# Patient Record
Sex: Female | Born: 1965
Health system: Southern US, Community
[De-identification: ages and names within clinical notes are randomized; demographics above are authoritative.]

## PROBLEM LIST (undated history)

## (undated) DIAGNOSIS — R002 Palpitations: Secondary | ICD-10-CM

## (undated) DIAGNOSIS — I499 Cardiac arrhythmia, unspecified: Secondary | ICD-10-CM

## (undated) DIAGNOSIS — Z972 Presence of dental prosthetic device (complete) (partial): Secondary | ICD-10-CM

## (undated) DIAGNOSIS — I70208 Unspecified atherosclerosis of native arteries of extremities, other extremity: Secondary | ICD-10-CM

## (undated) DIAGNOSIS — K2970 Gastritis, unspecified, without bleeding: Secondary | ICD-10-CM

## (undated) DIAGNOSIS — I447 Left bundle-branch block, unspecified: Secondary | ICD-10-CM

## (undated) DIAGNOSIS — K08109 Complete loss of teeth, unspecified cause, unspecified class: Secondary | ICD-10-CM

## (undated) DIAGNOSIS — J439 Emphysema, unspecified: Secondary | ICD-10-CM

## (undated) DIAGNOSIS — J449 Chronic obstructive pulmonary disease, unspecified: Secondary | ICD-10-CM

## (undated) DIAGNOSIS — I428 Other cardiomyopathies: Secondary | ICD-10-CM

## (undated) DIAGNOSIS — E785 Hyperlipidemia, unspecified: Secondary | ICD-10-CM

## (undated) DIAGNOSIS — I201 Angina pectoris with documented spasm: Secondary | ICD-10-CM

## (undated) DIAGNOSIS — I5020 Unspecified systolic (congestive) heart failure: Secondary | ICD-10-CM

## (undated) DIAGNOSIS — K219 Gastro-esophageal reflux disease without esophagitis: Secondary | ICD-10-CM

## (undated) DIAGNOSIS — R0789 Other chest pain: Secondary | ICD-10-CM

## (undated) DIAGNOSIS — C801 Malignant (primary) neoplasm, unspecified: Secondary | ICD-10-CM

## (undated) HISTORY — PX: CHOLECYSTECTOMY: SHX55

## (undated) HISTORY — DX: Other chest pain: R07.89

## (undated) HISTORY — DX: Palpitations: R00.2

## (undated) HISTORY — DX: Angina pectoris with documented spasm: I20.1

## (undated) HISTORY — DX: Unspecified atherosclerosis of native arteries of extremities, other extremity: I70.208

## (undated) HISTORY — PX: TONSILLECTOMY: SUR1361

## (undated) HISTORY — DX: Other cardiomyopathies: I42.8

## (undated) HISTORY — PX: DEBRIDEMENT TENNIS ELBOW: SHX1442

## (undated) HISTORY — PX: BUNIONECTOMY: SHX129

## (undated) HISTORY — PX: BACK SURGERY: SHX140

## (undated) HISTORY — PX: LUMBAR DISC SURGERY: SHX700

## (undated) HISTORY — PX: ROTATOR CUFF REPAIR: SHX139

## (undated) HISTORY — PX: ABDOMINAL HYSTERECTOMY: SHX81

## (undated) HISTORY — DX: Malignant (primary) neoplasm, unspecified: C80.1

## (undated) HISTORY — PX: APPENDECTOMY: SHX54

---

## 2000-04-22 ENCOUNTER — Other Ambulatory Visit: Admission: RE | Admit: 2000-04-22 | Discharge: 2000-04-22 | Payer: Self-pay | Admitting: Podiatry

## 2005-07-16 ENCOUNTER — Ambulatory Visit: Payer: Self-pay | Admitting: Orthopedic Surgery

## 2005-09-05 ENCOUNTER — Emergency Department: Payer: Self-pay | Admitting: Emergency Medicine

## 2006-08-29 ENCOUNTER — Emergency Department (HOSPITAL_COMMUNITY): Admission: EM | Admit: 2006-08-29 | Discharge: 2006-08-29 | Payer: Self-pay | Admitting: Emergency Medicine

## 2006-11-19 ENCOUNTER — Emergency Department: Payer: Self-pay | Admitting: Emergency Medicine

## 2006-11-26 ENCOUNTER — Ambulatory Visit: Payer: Self-pay

## 2006-12-06 ENCOUNTER — Emergency Department: Payer: Self-pay | Admitting: Emergency Medicine

## 2006-12-24 ENCOUNTER — Ambulatory Visit: Payer: Self-pay | Admitting: Pain Medicine

## 2007-01-06 ENCOUNTER — Ambulatory Visit: Payer: Self-pay | Admitting: Pain Medicine

## 2007-01-19 ENCOUNTER — Ambulatory Visit: Payer: Self-pay | Admitting: Physician Assistant

## 2007-01-22 ENCOUNTER — Ambulatory Visit: Payer: Self-pay | Admitting: Pain Medicine

## 2007-01-30 ENCOUNTER — Ambulatory Visit (HOSPITAL_COMMUNITY): Admission: RE | Admit: 2007-01-30 | Discharge: 2007-01-31 | Payer: Self-pay | Admitting: Orthopaedic Surgery

## 2007-04-05 ENCOUNTER — Encounter: Admission: RE | Admit: 2007-04-05 | Discharge: 2007-04-05 | Payer: Self-pay | Admitting: Orthopaedic Surgery

## 2007-05-25 ENCOUNTER — Inpatient Hospital Stay (HOSPITAL_COMMUNITY): Admission: RE | Admit: 2007-05-25 | Discharge: 2007-05-28 | Payer: Self-pay | Admitting: Orthopaedic Surgery

## 2008-03-11 ENCOUNTER — Emergency Department: Payer: Self-pay | Admitting: Emergency Medicine

## 2008-11-18 ENCOUNTER — Emergency Department: Payer: Self-pay | Admitting: Emergency Medicine

## 2009-01-09 ENCOUNTER — Emergency Department: Payer: Self-pay | Admitting: Emergency Medicine

## 2009-06-11 ENCOUNTER — Encounter (INDEPENDENT_AMBULATORY_CARE_PROVIDER_SITE_OTHER): Payer: Self-pay | Admitting: General Surgery

## 2009-06-11 ENCOUNTER — Observation Stay (HOSPITAL_COMMUNITY): Admission: EM | Admit: 2009-06-11 | Discharge: 2009-06-12 | Payer: Self-pay | Admitting: Emergency Medicine

## 2011-03-25 LAB — COMPREHENSIVE METABOLIC PANEL
Albumin: 3.4 g/dL — ABNORMAL LOW (ref 3.5–5.2)
Alkaline Phosphatase: 99 U/L (ref 39–117)
Calcium: 9.3 mg/dL (ref 8.4–10.5)
Creatinine, Ser: 0.83 mg/dL (ref 0.4–1.2)
Potassium: 3.4 mEq/L — ABNORMAL LOW (ref 3.5–5.1)
Sodium: 140 mEq/L (ref 135–145)
Total Protein: 6.3 g/dL (ref 6.0–8.3)

## 2011-03-25 LAB — URINALYSIS, ROUTINE W REFLEX MICROSCOPIC
Glucose, UA: NEGATIVE mg/dL
Ketones, ur: NEGATIVE mg/dL
Specific Gravity, Urine: 1.005 (ref 1.005–1.030)
Urobilinogen, UA: 0.2 mg/dL (ref 0.0–1.0)

## 2011-03-25 LAB — CBC
MCHC: 34.8 g/dL (ref 30.0–36.0)
MCV: 93 fL (ref 78.0–100.0)
RDW: 12.5 % (ref 11.5–15.5)

## 2011-04-30 NOTE — Op Note (Signed)
NAMEELIZAH, Conley                ACCOUNT NO.:  [id number]   MEDICAL RECORD NO.:  [id number]          PATIENT TYPE:  INP   LOCATION:  5002                         FACILITY:  [facility]   PHYSICIAN:  Mark C. Ophelia Charter, M.D.    DATE OF BIRTH:  May 26, 1966   DATE OF PROCEDURE:  05/25/2007  DATE OF DISCHARGE:                               OPERATIVE REPORT   PREOPERATIVE DIAGNOSIS:  Recurrent L4-5 herniated nucleus pulposus.   POSTOPERATIVE DIAGNOSIS:  Recurrent L4-5 herniated nucleus pulposus.   PROCEDURES:  1. L5-S1 right microdiskectomy for recurrent herniated nucleus      pulposus.  2. L5-S1 right translumbar interbody fusion with PEEK interbody cage,      local bone interbody and bilateral intertransverse process fusion.  3. Pedicle instrumentation, one level.   SURGEON:  Mark C. Ophelia Charter, MD   ASSISTANT:  Maud Deed, PA-C   ANESTHESIA:  GOT.   ESTIMATED BLOOD LOSS:  700 mL.   DRAINS:  One Hemovac.   After induction of general anesthesia, orotracheal intubation, the  patient placed prone.  Preoperative Ancef was given prophylactically.  The back was prepped with DuraPrep.  The usual area had been squared  with towels, Betadine Vi-Drape applied after sterile skin marker.  The  patient had a previous microdiskectomy in February 2008 with disk  recurrence 2 months afterwards.  She had had narrowing of the space with  the endplate edema changes at L5-S1.   Incision was made using the old scar, extended proximally and distally,  and the viper retractor was placed.  The L5-S1 level was identified,  facets were exposed, and the transverse process re-exposed at L5 and the  sacral ala.  Cross-table lateral C-arm confirmed that we were at the  appropriate level with a Kocher clamp placed directly over the disk.  L5  Laminectomy was performed, decompression at the level of the pedicle.  Microdiskectomy was performed first on the left side and then on the  right side.  Scar tissue was taken  down.  There was distal scar tissue  around the nerve root and scan for __________  did not show the degree  of disk herniation with a large fragment of disc scarred down that had  been extruded and had cephalad migration.  Disk fragments were out  laterally on the right as well.  The pedicle was identified at S1 and L5  and using an osteotome, bone was removed, taking the facet on the right  side, exposing the nerve root above as it swept underneath the pedicle,  careful preservation.  Gelfoam  and small patties were used and the  bipolar cautery was used in the axilla for bleeding.  Microdiskectomy  was performed, removing chunks of disk and continuing to sweep with  Epsteins and microdissection until finally all the fragment had been  removed.  The nerve root was free.  The operative microscope was removed  and pedicle screws were placed.  Fluoroscopy was used intermittently,  checking first with the pedicle starter, checking this with a spot film  followed by the joystick impaction, checking it using the  ball-tip  feeler to make sure it was inbound, followed by tapping, feeling once  again, and then placement of the screw.  On the right side at L5 a 45-mm  screw was used.  All other screws were 40 mm, and all were 5.5 mm [employer]  Polaris pedicle instrumentation system.  On the L5 level on the left,  one pass was made with a pedicle finder, which looked in excellent  position.  As the joystick was being passed, it passed inferiorly.  There was a jump of the nerve root.  There was no dural tear.  It was  identified out the pedicle inferiorly.  It was backed up and then  appropriately repositioned, checked under fluoroscopy.  The operative  microscope was used to look at the nerve root and it looked fine.  After  a feeler was used to make sure it was entirely in the pedicle, tapping,  tapping, feeling again, and then placement of the screw, checking under  fluoroscopy, and it was in  excellent position.  A pedicle screw was  placed on the left at S1 in standard fashion and then 40 mm rods were  used on both sides after decortication of the transverse processes at L5  and the sacral ala.  Aspirate from the pedicle was used along with local  blood and some Vitoss mixed with local bone taken from the laminectomy  and removal of the facet.  Large chunks of bone were packed anteriorly  after a diskectomy was performed on both sides.  A box 7 and a box 9 mm  box cutter was used, followed by trials.  Bone was packed anteriorly  with the tap, impacted.  A trial was placed behind it to make sure that  the real cage would fit.  A PEEK cage was packed with bone locally.  It  was impacted and rotated and checked under fluoroscopy and was in  transverse position with 6-8 large chunks of bone anterior to it.  The  cage was stable.  Rods were placed, locked to compress first on the  right, then on the left side.  All caps were secure.  Final spot  fluoroscopic pictures were taken with the pickups on the right side.  The left S1 pedicle screw was slightly more lateral but with palpation  was entirely inbound and with 40-mm size it was not long enough  penetrate anteriorly out.  A hockey stick was used to feel around each  pedicle.  Inferiorly the nerve roots were all checked and were all free.  After irrigation with saline solution, some small tiny pieces of bone  were removed that had fallen in the canal, packed back over lateral.  Vicryl #1 was used in the fascia, followed by 2-0 Vicryl in the  subcutaneous tissue, 4-0 Vicryl subcuticular, tincture of Benzoin, Steri-  Strips, Marcaine infiltration, 4x4s, ABD and dressing.  Instrument count  and needle count was correct.  A Foley was placed at the beginning of  the procedure and was left in.  Patty count was correct.      Mark C. Ophelia Charter, M.D.  Electronically Signed    MCY/MEDQ  D:  05/25/2007  T:  05/26/2007  Job:  [id number]

## 2011-04-30 NOTE — Op Note (Signed)
NAMEELLIOTT, QUADE                ACCOUNT NO.:  [id number]   MEDICAL RECORD NO.:  [id number]          PATIENT TYPE:  INP   LOCATION:  5150                         FACILITY:  [facility]   PHYSICIAN:  Almond Lint, MD       DATE OF BIRTH:  [birth year]   DATE OF PROCEDURE:  06/11/2009  DATE OF DISCHARGE:                               OPERATIVE REPORT   PREOPERATIVE DIAGNOSIS:  Acute appendicitis.   POSTOPERATIVE DIAGNOSIS:  Acute appendicitis.   PROCEDURE PERFORMED:  Laparoscopic appendectomy.   FINDINGS:  Very mild appendicitis.  Normal right ovary, terminal ileum  normal.   SURGEON:  Almond Lint, MD   ASSISTANT:  None.   ANESTHESIA:  General and local.   ESTIMATED BLOOD LOSS:  Minimal.   COMPLICATIONS:  None known.   PROCEDURE:  Ms. Schlagel was identified in the holding area and taken to  the operating room where she was placed supine on the operating room  table.  General anesthesia was induced.  The patient's abdomen was  prepped and draped in a sterile fashion after her left arm was tucked  and a Foley catheter was placed.  A time-out was performed according to  the surgical safety check list.  When all was correct, we continued.  The infraumbilical skin was anesthetized with local anesthetic.  A  curvilinear and transverse incision was made with an #11 blade.  The  subcutaneous tissues were spread with a Kelly, and the umbilical stalk  elevated with the Kocher clamp.  The midline fascia was incised with an  #11 blade and a Kelly clamp was used to confirm entrance into the  peritoneal cavity.  A pursestring suture of 0 Vicryl was placed around  the fascial incision.  The Hasson trocar was introduced into the abdomen  and pneumoperitoneum was achieved to a pressure of 15 mmHg.  The patient  was placed into Trendelenburg position and rotated to the left.  The  camera was advanced into the abdomen and immediately a dilated appendix  was seen.  It was also slightly hyperemic.   The 2 additional ports were  placed in the midline suprapubic region and the left lower quadrant  after administration of local under direct visualization.  The appendix  was elevated with a locking grasper and the Kentucky clamp was used to  dissect the mesoappendix away from the appendiceal base.  The Harmonic  scalpel was used to divide the mesoappendix and the cecum was  manipulated to ensure that we were at the base of the appendix.  We  switched from the 10-mm scope to the 5-mm scope, and the Endo GIA was  used to fire across the base of the appendix.  This was then placed into  an EndoCatch bag and removed through the umbilical incision.  The  appendiceal stump was inspected, and there were no signs of bleeding.  The area was irrigated and the left ovary was examined and seen to be  normal.  The patient has had a history of hysterectomy and one ovary  removed.  The pneumoperitoneum was allowed to evacuate, and  the  epigastric and left  lower quadrant ports were removed.  The Hasson was then removed, and the  pursestring suture was used to close the umbilical fascia.  This was  palpated to make sure that the closure was tight.  The skin of all of  the incisions was closed with 4-0 Monocryl in a subcuticular fashion.  The wounds were cleaned, dried, and dressed with Dermabond.      Almond Lint, MD  Electronically Signed     FB/MEDQ  D:  06/11/2009  T:  06/12/2009  Job:  [id number]

## 2011-05-03 NOTE — Discharge Summary (Signed)
NAMESHAELEIGH, Caroline Conley                ACCOUNT NO.:  [id number]   MEDICAL RECORD NO.:  [id number]          PATIENT TYPE:  INP   LOCATION:  5002                         FACILITY:  [facility]   PHYSICIAN:  Mark C. Ophelia Charter, M.D.    DATE OF BIRTH:  09-Sep-1966   DATE OF ADMISSION:  05/25/2007  DATE OF DISCHARGE:  05/28/2007                               DISCHARGE SUMMARY   ADMISSION DIAGNOSES:  1. Recurrent herniated nucleus pulposus L4-5.  2. Status post L5-S1 microdiskectomy, February 2008.   DISCHARGE DIAGNOSES:  1. Recurrent L4-5 herniated nucleus pulposus.  2. Recurrent herniated nucleus pulposus L4-5.  3. Status post L5-S1 microdiskectomy, February 2008.  4. Posthemorrhagic anemia.   PROCEDURE:  On May 25, 2007 the patient underwent:  1. L5-S1 right microdiskectomy for recurrent herniated nucleus      pulposus.  2. L5-S1 right translumbar interbody fusion, with interbody cage and      pedicle screw and rod instrumentation -- performed by Dr. Ophelia Charter and      assisted by Maud Deed [facility] under general anesthesia.   CONSULTATIONS:  None.   BRIEF HISTORY:  Patient is a 45 year old female with progressive right  lower extremity radicular pain.  She is status post right L5-S1  microdiskectomy in February 2008.  MRI scan has shown a right  paracentral disk protrusion, mildly displacing the descending right S1  nerve root.  It was felt that she would benefit from surgical  intervention and was admitted for the procedure as stated above.   BRIEF HOSPITAL COURSE:  The patient tolerated the procedure under  general anesthesia without complications.  Postoperatively she was  treated with PCA analgesics and weaned to p.o. analgesics, including  OxyContin 20 mg b.i.d. prior to discharge.  Tylox was used for  breakthrough pain.  She described predominantly low back pain during the  hospital stay, which was incisional in nature.  Very little leg pain  during the hospital stay.  The patient was  started on physical therapy  for ambulation and gait training.  Prior to discharge she was ambulating  in the hallway independently.  Prior to discharge she had ambulated as  much as 110 feet with a rolling walker and standby assistance.  She was  given instruction in back care as well.  Occupational therapy assisted  with ADLs.  The patient's wound was treated with daily dressing changes  and found to be healing without signs of infection.  The patient's  hemoglobin postoperatively was noted to be 8.8.  Repeat on May 27, 2007  showed values to be 8.4 and stable.  The patient did receive 100 mL of  Self-Saver blood intraoperatively.  Prior to discharge the patient was  voiding without difficulty.  The patient had a bowel movement during the  hospital stay, and was able to take a regular diet prior to discharge.  On May 28, 2007 the patient was afebrile with vital signs stable.  She  was felt stable for discharge to her home, with referral for home health  physical therapy.   PERTINENT LABORATORY VALUES:  Admission CBC:  Hemoglobin  13.7,  hematocrit 39.6 respectively.  Hemoglobin prior to discharge 8.4 and  hematocrit 23.6.  Coagulation studies on admission were within normal  limits, with exception of PTT noted mildly elevated at 38.  __________  CBC on admission showed potassium of 3.3, glucose 66.  Postoperatively  hemoglobin was normal at 3.7, glucose 111 and BUN 4.  Urinalysis on  admission was negative for urinary tract infection.  EKG  preoperatively  showed normal sinus rhythm, with sinus arrhythmia; no old tracings for  comparison.  No chest x-ray on the chart at the time of this dictation.   PLAN:  The patient was discharged to her home.  She was given  instructions to increase activity slowly, utilizing a walker for  ambulation.  She was instructed to change her dressing daily, and she  was allowed to shower at home, keeping the wound covered with her  dressing.    MEDICATIONS AT DISCHARGE:  1. OxyContin 20 mg one p.o. q.12 h.  2. Tylox 1-2 every 4-6 hours as needed for breakthrough pain.  3. Colace one p.o. b.i.d.  4. Iron sulfate 325 mg 1 tablet twice daily times 1 month.   FOLLOWUP:  She will follow up with Dr. Ophelia Charter 2 weeks from the date of  surgery.  She will receive home health physical therapy and occupational  therapy through [facility].  Necessary durable medical equipment  was made available to the patient prior to discharge from the hospital.  The patient will resume a regular diet.  She is instructed to call Dr.  Ophelia Charter should there be any complications or concerns prior to her return  office visit.  All questions were encouraged and answered prior to  discharge.      Wende Neighbors, P.A.      Mark C. Ophelia Charter, M.D.  Electronically Signed    SMV/MEDQ  D:  07/06/2007  T:  07/07/2007  Job:  [id number]

## 2011-05-03 NOTE — Op Note (Signed)
Caroline Conley, Caroline Conley                ACCOUNT NO.:  [id number]   MEDICAL RECORD NO.:  [id number]          PATIENT TYPE:  OIB   LOCATION:  5008                         FACILITY:  [facility]   PHYSICIAN:  Mark C. Ophelia Charter, M.D.    DATE OF BIRTH:  1966/02/02   DATE OF PROCEDURE:  01/30/2007  DATE OF DISCHARGE:  01/31/2007                               OPERATIVE REPORT   PREOPERATIVE DIAGNOSIS:  Right L5-S1 herniated nucleus pulposus.   POSTOPERATIVE DIAGNOSIS:  Right L5-S1 herniated nucleus pulposus.   PROCEDURE:  Right L5-S1 microdiskectomy.   SURGEON:  Mark C. Ophelia Charter, MD   ANESTHESIA:  GOT plus 7 mL local Marcaine skin.   ASSISTANT:  R.N.F.A.   ESTIMATED BLOOD LOSS:  Minimal.   This is a 45 year old female with L5-S1 HNP right with increased pain  for the last 16 weeks with positive straight leg raising, right peroneal  weakness, decreased sensation, S1, and an MRI demonstrating a disk  herniation with compression.   She underwent general anesthesia by Anesthesia staff, was transferred to  the prone position on the [place] frame and then placed in a kneeling  position with careful padding and positioning.  The back was prepped  DuraPrep, squared with towels.  Preoperative antibiotics were given,  time-out was taken, and a cross-table lateral, needle after Betadine, Vi-  Drape and laminectomy sheet and draped corresponded to the appropriate  level at right L5-S1.  Incision was made.  Dissection continued down to  the planned level.  The segment was identified and the level of the  needle preoperatively was exactly over the L5-S1 level.  A foraminotomy  was performed.  A portion of the lamina was removed.  Big chunks of  ligament were removed.  The nerve was gently retracted.  The disk  herniation was identified.  There was a central and right paracentral  disk protrusion, which was incised with the annulus.  Chunks of ligament  removed.  Epstein curettes were used.  There was no  significant  foraminal stenosis from facets but she did have some hypertrophic  ligamentum.  Once the disk protrusion was decompressed, some lateral  disks were coagulated the with bipolar cautery.  The nerve root was  free.  The foramen was enlarged slightly to give the nerve root a little  bit more room.  Sweeps were made anterior, distal, lateral and medial to  the nerve root as well as anterior to the dura with no areas of  remaining compression with palpation with the hockey stick.  The wound  was  irrigated and closed by layers with 0 Vicryl in the deep fascia, 2-0  Vicryl subcutaneous tissue, subcuticular skin closure, tincture of  Benzoin, Steri-Strips, Marcaine infiltration, a postop dressing and  tape.  The patient was transferred to the recovery room.  Instrument  count and needle count was correct.      Mark C. Ophelia Charter, M.D.  Electronically Signed     MCY/MEDQ  D:  04/06/2007  T:  04/07/2007  Job:  [id number]

## 2011-10-03 LAB — COMPREHENSIVE METABOLIC PANEL
ALT: 20
CO2: 28
Calcium: 9.7
GFR calc non Af Amer: >60
Glucose, Bld: 66 — ABNORMAL LOW
Potassium: 3.3 — ABNORMAL LOW
Sodium: 139

## 2011-10-03 LAB — URINALYSIS, ROUTINE W REFLEX MICROSCOPIC
Glucose, UA: NEGATIVE
Hgb urine dipstick: NEGATIVE
Ketones, ur: NEGATIVE
Protein, ur: NEGATIVE
Specific Gravity, Urine: 1.013 (ref 1.005–1.035)

## 2011-10-03 LAB — HEMOGLOBIN AND HEMATOCRIT, BLOOD
HCT: 23.6 — ABNORMAL LOW
Hemoglobin: 8.4 — ABNORMAL LOW

## 2011-10-03 LAB — BASIC METABOLIC PANEL
Calcium: 8.2 — ABNORMAL LOW
Chloride: 105
GFR calc Af Amer: >60
Glucose, Bld: 111 — ABNORMAL HIGH
Potassium: 3.7

## 2011-10-03 LAB — ABO/RH: ABO/RH(D): O NEG

## 2011-10-03 LAB — PROTIME-INR
INR: 1
Prothrombin Time: 13.3

## 2011-10-03 LAB — CBC: MCV: 92.2

## 2011-10-03 LAB — TYPE AND SCREEN: ABO/RH(D): O NEG

## 2011-10-03 LAB — APTT: aPTT: 38 — ABNORMAL HIGH

## 2011-10-16 ENCOUNTER — Encounter (HOSPITAL_COMMUNITY): Payer: Self-pay

## 2011-10-17 ENCOUNTER — Encounter (HOSPITAL_COMMUNITY): Payer: Self-pay

## 2011-10-17 ENCOUNTER — Encounter (HOSPITAL_COMMUNITY)
Admission: RE | Admit: 2011-10-17 | Discharge: 2011-10-17 | Disposition: A | Discharge status: Home / Self Care | Payer: BC Managed Care – PPO | Source: Ambulatory Visit | Attending: Orthopaedic Surgery | Admitting: Orthopaedic Surgery

## 2011-10-17 LAB — URINALYSIS, ROUTINE W REFLEX MICROSCOPIC
Bilirubin Urine: NEGATIVE
Glucose, UA: NEGATIVE mg/dL
Ketones, ur: NEGATIVE mg/dL
pH: 5 (ref 5.0–8.0)

## 2011-10-17 LAB — COMPREHENSIVE METABOLIC PANEL
AST: 15 U/L (ref 0–37)
Albumin: 3.8 g/dL (ref 3.5–5.2)
Alkaline Phosphatase: 118 U/L — ABNORMAL HIGH (ref 39–117)
Chloride: 103 mEq/L (ref 96–112)
Potassium: 4.6 mEq/L (ref 3.5–5.1)
Total Bilirubin: 0.3 mg/dL (ref 0.3–1.2)
Total Protein: 6.8 g/dL (ref 6.0–8.3)

## 2011-10-17 LAB — CBC
HCT: 38.5 % (ref 36.0–46.0)
Hemoglobin: 13.2 g/dL (ref 12.0–15.0)
MCH: 32.1 pg (ref 26.0–34.0)
MCV: 93.7 fL (ref 78.0–100.0)
RBC: 4.11 MIL/uL (ref 3.87–5.11)

## 2011-10-17 LAB — SURGICAL PCR SCREEN
MRSA, PCR: NEGATIVE
Staphylococcus aureus: NEGATIVE

## 2011-10-17 LAB — URINE MICROSCOPIC-ADD ON

## 2011-10-17 MED ORDER — CEFAZOLIN SODIUM 1-5 GM-% IV SOLN: 1.0000 g | INTRAVENOUS | Status: DC

## 2011-10-17 NOTE — Pre-Procedure Instructions (Signed)
20 Caroline Conley  10/17/2011   Your procedure is scheduled on: Monday 10/21/11   Report to [facility] at 1030 AM.  Call this number if you have problems the morning of surgery: [phone]   Remember:   Do not eat food:After Midnight. SUNDAY  Do not drink clear liquids: 4 Hours before arrival.  Take these medicines the morning of surgery with A SIP OF WATER:  NORCO  Do not wear jewelry, make-up or nail polish.  Do not wear lotions, powders, or perfumes. You may wear deodorant.  Do not shave 48 hours prior to surgery.  Do not bring valuables to the hospital.  Contacts, dentures or bridgework may not be worn into surgery.  Leave suitcase in the car. After surgery it may be brought to your room.  For patients admitted to the hospital, checkout time is 11:00 AM the day of discharge.   Patients discharged the day of surgery will not be allowed to drive home.  Name and phone number of your driver:  Caroline Conley -- [phone]   [phone]                Special Instructions: CHG Shower Use Special Wash: 1/2 bottle night before surgery and 1/2 bottle morning of surgery.   Please read over the following fact sheets that you were given: Pain Booklet, Coughing and Deep Breathing, Blood Transfusion Information, MRSA Information and Surgical Site Infection Prevention

## 2011-10-18 ENCOUNTER — Ambulatory Visit
Admission: RE | Admit: 2011-10-18 | Discharge: 2011-10-18 | Disposition: A | Discharge status: Home / Self Care | Payer: BC Managed Care – PPO | Source: Ambulatory Visit | Attending: Orthopaedic Surgery | Admitting: Orthopaedic Surgery

## 2011-10-18 ENCOUNTER — Other Ambulatory Visit: Payer: Self-pay | Admitting: Orthopaedic Surgery

## 2011-10-18 DIAGNOSIS — M549 Dorsalgia, unspecified: Secondary | ICD-10-CM

## 2011-10-20 ENCOUNTER — Other Ambulatory Visit: Payer: Self-pay | Admitting: Orthopaedic Surgery

## 2011-10-21 ENCOUNTER — Inpatient Hospital Stay (HOSPITAL_COMMUNITY)
Admission: RE | Admit: 2011-10-21 | Discharge: 2011-10-23 | DRG: 756 | Disposition: A | Discharge status: Home / Self Care | Payer: BC Managed Care – PPO | Source: Ambulatory Visit | Attending: Orthopaedic Surgery | Admitting: Orthopaedic Surgery

## 2011-10-21 ENCOUNTER — Encounter (HOSPITAL_COMMUNITY): Payer: Self-pay | Admitting: *Deleted

## 2011-10-21 ENCOUNTER — Encounter (HOSPITAL_COMMUNITY)
Admission: RE | Disposition: A | Discharge status: Home / Self Care | Payer: Self-pay | Source: Ambulatory Visit | Attending: Orthopaedic Surgery

## 2011-10-21 ENCOUNTER — Encounter (HOSPITAL_COMMUNITY): Payer: Self-pay | Admitting: Anesthesiology

## 2011-10-21 ENCOUNTER — Inpatient Hospital Stay (HOSPITAL_COMMUNITY): Payer: BC Managed Care – PPO

## 2011-10-21 ENCOUNTER — Inpatient Hospital Stay (HOSPITAL_COMMUNITY): Payer: BC Managed Care – PPO | Admitting: Anesthesiology

## 2011-10-21 DIAGNOSIS — T84498A Other mechanical complication of other internal orthopedic devices, implants and grafts, initial encounter: Principal | ICD-10-CM | POA: Diagnosis present

## 2011-10-21 DIAGNOSIS — Z79899 Other long term (current) drug therapy: Secondary | ICD-10-CM

## 2011-10-21 DIAGNOSIS — Z01818 Encounter for other preprocedural examination: Secondary | ICD-10-CM

## 2011-10-21 DIAGNOSIS — Y831 Surgical operation with implant of artificial internal device as the cause of abnormal reaction of the patient, or of later complication, without mention of misadventure at the time of the procedure: Secondary | ICD-10-CM | POA: Diagnosis present

## 2011-10-21 DIAGNOSIS — Z981 Arthrodesis status: Secondary | ICD-10-CM

## 2011-10-21 DIAGNOSIS — F17200 Nicotine dependence, unspecified, uncomplicated: Secondary | ICD-10-CM | POA: Diagnosis present

## 2011-10-21 DIAGNOSIS — Z01812 Encounter for preprocedural laboratory examination: Secondary | ICD-10-CM

## 2011-10-21 LAB — TYPE AND SCREEN: Antibody Screen: NEGATIVE

## 2011-10-21 SURGERY — POSTERIOR LUMBAR FUSION 1 LEVEL
Anesthesia: General | Site: Back | Wound class: Clean

## 2011-10-21 MED ORDER — ONDANSETRON HCL 4 MG/2ML IJ SOLN
INTRAMUSCULAR | Status: DC | PRN
  Administered 2011-10-21: 4 mg via INTRAVENOUS

## 2011-10-21 MED ORDER — ACETAMINOPHEN 325 MG PO TABS: 650.0000 mg | ORAL_TABLET | ORAL | Status: DC | PRN

## 2011-10-21 MED ORDER — ONDANSETRON HCL 4 MG/2ML IJ SOLN: 4.0000 mg | INTRAMUSCULAR | Status: DC | PRN

## 2011-10-21 MED ORDER — OXYCODONE-ACETAMINOPHEN 5-325 MG PO TABS
1.0000 | ORAL_TABLET | ORAL | Status: DC | PRN
  Administered 2011-10-23: 2 via ORAL
  Filled 2011-10-21: qty 2

## 2011-10-21 MED ORDER — ONDANSETRON HCL 4 MG/2ML IJ SOLN: 4.0000 mg | Freq: Once | INTRAMUSCULAR | Status: DC | PRN

## 2011-10-21 MED ORDER — SODIUM CHLORIDE 0.9 % IJ SOLN
3.0000 mL | Freq: Two times a day (BID) | INTRAMUSCULAR | Status: DC
  Administered 2011-10-22: 3 mL via INTRAVENOUS

## 2011-10-21 MED ORDER — PHENOL 1.4 % MT LIQD
1.0000 | OROMUCOSAL | Status: DC | PRN
  Filled 2011-10-21: qty 177

## 2011-10-21 MED ORDER — EPHEDRINE SULFATE 50 MG/ML IJ SOLN
INTRAMUSCULAR | Status: DC | PRN
  Administered 2011-10-21 (×2): 5 mg via INTRAVENOUS

## 2011-10-21 MED ORDER — THROMBIN 5000 UNITS EX KIT
PACK | CUTANEOUS | Status: DC | PRN
  Administered 2011-10-21: 5000 [IU] via TOPICAL

## 2011-10-21 MED ORDER — NEOSTIGMINE METHYLSULFATE 1 MG/ML IJ SOLN
INTRAMUSCULAR | Status: DC | PRN
  Administered 2011-10-21: 3 mg via INTRAVENOUS

## 2011-10-21 MED ORDER — MIDAZOLAM HCL 5 MG/5ML IJ SOLN
INTRAMUSCULAR | Status: DC | PRN
  Administered 2011-10-21: 2 mg via INTRAVENOUS

## 2011-10-21 MED ORDER — HYDROMORPHONE HCL PF 1 MG/ML IJ SOLN
0.2500 mg | INTRAMUSCULAR | Status: DC | PRN
  Administered 2011-10-21: 0.25 mg via INTRAVENOUS
  Administered 2011-10-21 (×2): 0.5 mg via INTRAVENOUS
  Administered 2011-10-21: 0.25 mg via INTRAVENOUS
  Administered 2011-10-21: 0.5 mg via INTRAVENOUS

## 2011-10-21 MED ORDER — LACTATED RINGERS IV SOLN
INTRAVENOUS | Status: DC
  Administered 2011-10-21: 12:00:00 via INTRAVENOUS

## 2011-10-21 MED ORDER — PROPOFOL 10 MG/ML IV EMUL
INTRAVENOUS | Status: DC | PRN
  Administered 2011-10-21: 200 mg via INTRAVENOUS

## 2011-10-21 MED ORDER — METHOCARBAMOL 500 MG PO TABS
500.0000 mg | ORAL_TABLET | Freq: Four times a day (QID) | ORAL | Status: DC | PRN
  Administered 2011-10-21 – 2011-10-23 (×3): 500 mg via ORAL
  Filled 2011-10-21 (×3): qty 1

## 2011-10-21 MED ORDER — DIPHENHYDRAMINE HCL 50 MG/ML IJ SOLN: 12.5000 mg | Freq: Four times a day (QID) | INTRAMUSCULAR | Status: DC | PRN

## 2011-10-21 MED ORDER — DOCUSATE SODIUM 100 MG PO CAPS
100.0000 mg | ORAL_CAPSULE | Freq: Two times a day (BID) | ORAL | Status: DC
  Administered 2011-10-21 – 2011-10-23 (×4): 100 mg via ORAL
  Filled 2011-10-21 (×4): qty 1

## 2011-10-21 MED ORDER — FENTANYL CITRATE 0.05 MG/ML IJ SOLN
INTRAMUSCULAR | Status: DC | PRN
  Administered 2011-10-21 (×3): 100 ug via INTRAVENOUS
  Administered 2011-10-21: 50 ug via INTRAVENOUS
  Administered 2011-10-21: 150 ug via INTRAVENOUS

## 2011-10-21 MED ORDER — SODIUM CHLORIDE 0.9 % IV SOLN
INTRAVENOUS | Status: DC | PRN
  Administered 2011-10-21: 16:00:00 via INTRAVENOUS

## 2011-10-21 MED ORDER — ONDANSETRON HCL 4 MG/2ML IJ SOLN: 4.0000 mg | Freq: Four times a day (QID) | INTRAMUSCULAR | Status: DC | PRN

## 2011-10-21 MED ORDER — NALOXONE HCL 0.4 MG/ML IJ SOLN: 0.4000 mg | INTRAMUSCULAR | Status: DC | PRN

## 2011-10-21 MED ORDER — THROMBIN 20000 UNITS EX KIT
PACK | CUTANEOUS | Status: DC | PRN
  Administered 2011-10-21: 20000 [IU] via TOPICAL

## 2011-10-21 MED ORDER — LACTATED RINGERS IV SOLN
INTRAVENOUS | Status: DC | PRN
  Administered 2011-10-21 (×2): via INTRAVENOUS

## 2011-10-21 MED ORDER — DIPHENHYDRAMINE HCL 12.5 MG/5ML PO ELIX
12.5000 mg | ORAL_SOLUTION | Freq: Four times a day (QID) | ORAL | Status: DC | PRN
  Filled 2011-10-21: qty 5

## 2011-10-21 MED ORDER — HYDROMORPHONE HCL PF 1 MG/ML IJ SOLN: 0.5000 mg | INTRAMUSCULAR | Status: DC | PRN

## 2011-10-21 MED ORDER — METHOCARBAMOL 100 MG/ML IJ SOLN
500.0000 mg | Freq: Four times a day (QID) | INTRAVENOUS | Status: DC | PRN
  Filled 2011-10-21 (×2): qty 5

## 2011-10-21 MED ORDER — KCL IN DEXTROSE-NACL 20-5-0.45 MEQ/L-%-% IV SOLN
INTRAVENOUS | Status: DC
  Filled 2011-10-21 (×10): qty 1000

## 2011-10-21 MED ORDER — ROCURONIUM BROMIDE 100 MG/10ML IV SOLN
INTRAVENOUS | Status: DC | PRN
  Administered 2011-10-21: 10 mg via INTRAVENOUS
  Administered 2011-10-21: 50 mg via INTRAVENOUS

## 2011-10-21 MED ORDER — HYDROMORPHONE 0.3 MG/ML IV SOLN
INTRAVENOUS | Status: AC
  Administered 2011-10-21: 7.5 mg via INTRAVENOUS
  Administered 2011-10-22: 1.79 mg via INTRAVENOUS
  Administered 2011-10-22: 2.79 mg via INTRAVENOUS
  Administered 2011-10-22: 2.19 mg via INTRAVENOUS
  Administered 2011-10-22: 1.99 mg via INTRAVENOUS
  Administered 2011-10-22: 1.59 mg via INTRAVENOUS
  Administered 2011-10-22: 7.5 mg via INTRAVENOUS
  Administered 2011-10-22: 1.19 mg via INTRAVENOUS
  Administered 2011-10-23: 3.19 mg via INTRAVENOUS
  Administered 2011-10-23: 7.5 mg via INTRAVENOUS
  Administered 2011-10-23: 1.79 mg via INTRAVENOUS

## 2011-10-21 MED ORDER — GLYCOPYRROLATE 0.2 MG/ML IJ SOLN
INTRAMUSCULAR | Status: DC | PRN
  Administered 2011-10-21: .6 mg via INTRAVENOUS

## 2011-10-21 MED ORDER — KETOROLAC TROMETHAMINE 30 MG/ML IJ SOLN
30.0000 mg | Freq: Once | INTRAMUSCULAR | Status: DC
  Administered 2011-10-21: 30 mg via INTRAVENOUS

## 2011-10-21 MED ORDER — SODIUM CHLORIDE 0.9 % IV SOLN: 250.0000 mL | INTRAVENOUS | Status: DC

## 2011-10-21 MED ORDER — SODIUM CHLORIDE 0.9 % IJ SOLN: 9.0000 mL | INTRAMUSCULAR | Status: DC | PRN

## 2011-10-21 MED ORDER — CEFAZOLIN SODIUM-DEXTROSE 2-3 GM-% IV SOLR
2.0000 g | Freq: Once | INTRAVENOUS | Status: DC
  Filled 2011-10-21: qty 50

## 2011-10-21 MED ORDER — ACETAMINOPHEN 650 MG RE SUPP: 650.0000 mg | RECTAL | Status: DC | PRN

## 2011-10-21 MED ORDER — SODIUM CHLORIDE 0.9 % IJ SOLN: 3.0000 mL | INTRAMUSCULAR | Status: DC | PRN

## 2011-10-21 MED ORDER — CEFAZOLIN SODIUM 1-5 GM-% IV SOLN
INTRAVENOUS | Status: DC | PRN
  Administered 2011-10-21: 2 g via INTRAVENOUS

## 2011-10-21 MED ORDER — MENTHOL 3 MG MT LOZG: 1.0000 | LOZENGE | OROMUCOSAL | Status: DC | PRN

## 2011-10-21 MED ORDER — CEFAZOLIN SODIUM 1-5 GM-% IV SOLN
1.0000 g | Freq: Three times a day (TID) | INTRAVENOUS | Status: AC
  Administered 2011-10-21 – 2011-10-22 (×2): 1 g via INTRAVENOUS
  Filled 2011-10-21 (×2): qty 50

## 2011-10-21 SURGICAL SUPPLY — 68 items
APL SKNCLS STERI-STRIP NONHPOA (GAUZE/BANDAGES/DRESSINGS) ×1
BENZOIN TINCTURE PRP APPL 2/3 (GAUZE/BANDAGES/DRESSINGS) ×2 IMPLANT
BLADE SURG ROTATE 9660 (MISCELLANEOUS) IMPLANT
BUR ROUND FLUTED 4 SOFT TCH (BURR) ×2 IMPLANT
CLOSURE STERI STRIP 1/2 X4 (GAUZE/BANDAGES/DRESSINGS) ×1 IMPLANT
CLOTH BEACON ORANGE TIMEOUT ST (SAFETY) ×2 IMPLANT
CONNECTOR CROSS LRG (Orthopedic Implant) ×1 IMPLANT
CORDS BIPOLAR (ELECTRODE) ×2 IMPLANT
COVER SURGICAL LIGHT HANDLE (MISCELLANEOUS) ×2 IMPLANT
DRAPE C-ARM 42X72 X-RAY (DRAPES) ×2 IMPLANT
DRAPE MICROSCOPE LEICA (MISCELLANEOUS) ×2 IMPLANT
DRAPE SURG 17X23 STRL (DRAPES) ×6 IMPLANT
DRAPE TABLE COVER HEAVY DUTY (DRAPES) ×2 IMPLANT
DRSG EMULSION OIL 3X3 NADH (GAUZE/BANDAGES/DRESSINGS) ×2 IMPLANT
DRSG PAD ABDOMINAL 8X10 ST (GAUZE/BANDAGES/DRESSINGS) ×4 IMPLANT
DURAPREP 26ML APPLICATOR (WOUND CARE) ×2 IMPLANT
ELECT BLADE 4.0 EZ CLEAN MEGAD (MISCELLANEOUS)
ELECT CAUTERY BLADE 6.4 (BLADE) ×2 IMPLANT
ELECT REM PT RETURN 9FT ADLT (ELECTROSURGICAL) ×2
ELECTRODE BLDE 4.0 EZ CLN MEGD (MISCELLANEOUS) ×1 IMPLANT
ELECTRODE REM PT RTRN 9FT ADLT (ELECTROSURGICAL) ×1 IMPLANT
EVACUATOR 1/8 PVC DRAIN (DRAIN) ×2 IMPLANT
GAUZE SPONGE 4X4 12PLY STRL LF (GAUZE/BANDAGES/DRESSINGS) ×1 IMPLANT
GLOVE BIOGEL PI IND STRL 7.5 (GLOVE) ×1 IMPLANT
GLOVE BIOGEL PI IND STRL 8 (GLOVE) ×1 IMPLANT
GLOVE BIOGEL PI INDICATOR 7.5 (GLOVE) ×1
GLOVE BIOGEL PI INDICATOR 8 (GLOVE) ×1
GLOVE ECLIPSE 7.0 STRL STRAW (GLOVE) ×2 IMPLANT
GLOVE ORTHO TXT STRL SZ7.5 (GLOVE) ×2 IMPLANT
GOWN PREVENTION PLUS LG XLONG (DISPOSABLE) IMPLANT
GOWN PREVENTION PLUS XLARGE (GOWN DISPOSABLE) ×3 IMPLANT
GOWN STRL NON-REIN LRG LVL3 (GOWN DISPOSABLE) ×4 IMPLANT
HEMOSTAT SURGICEL 2X14 (HEMOSTASIS) IMPLANT
KIT BASIN OR (CUSTOM PROCEDURE TRAY) ×2 IMPLANT
KIT ROOM TURNOVER OR (KITS) ×2 IMPLANT
NDL HYPO 25X1 1.5 SAFETY (NEEDLE) IMPLANT
NDL SUT .5 MAYO 1.404X.05X (NEEDLE) ×1 IMPLANT
NEEDLE HYPO 25X1 1.5 SAFETY (NEEDLE) ×2 IMPLANT
NEEDLE MAYO TAPER (NEEDLE) ×2
NS IRRIG 1000ML POUR BTL (IV SOLUTION) ×2 IMPLANT
PACK LAMINECTOMY ORTHO (CUSTOM PROCEDURE TRAY) ×2 IMPLANT
PAD ARMBOARD 7.5X6 YLW CONV (MISCELLANEOUS) ×4 IMPLANT
PATTIES SURGICAL .5 X.5 (GAUZE/BANDAGES/DRESSINGS) IMPLANT
PATTIES SURGICAL .75X.75 (GAUZE/BANDAGES/DRESSINGS) ×2 IMPLANT
PLUG POLARIS 5.5 (Orthopedic Implant) ×4 IMPLANT
Polaris 40mm Ti Curved Rod ×1 IMPLANT
Polaris 5.5 x 45mm Screw ×1 IMPLANT
ROD CURVED 45MM (Rod) ×1 IMPLANT
SPONGE GAUZE 4X4 12PLY (GAUZE/BANDAGES/DRESSINGS) ×2 IMPLANT
SPONGE LAP 18X18 X RAY DECT (DISPOSABLE) IMPLANT
SPONGE LAP 4X18 X RAY DECT (DISPOSABLE) ×4 IMPLANT
SPONGE SURGIFOAM ABS GEL 100 (HEMOSTASIS) ×1 IMPLANT
STAPLER VISISTAT 35W (STAPLE) IMPLANT
STRIP CLOSURE SKIN 1/2X4 (GAUZE/BANDAGES/DRESSINGS) ×1 IMPLANT
SURGIFLO TRUKIT (HEMOSTASIS) ×1 IMPLANT
SUT BONE WAX W31G (SUTURE) IMPLANT
SUT VIC AB 2-0 CT1 27 (SUTURE) ×2
SUT VIC AB 2-0 CT1 TAPERPNT 27 (SUTURE) ×1 IMPLANT
SUT VICRYL 0 TIES 12 18 (SUTURE) ×2 IMPLANT
SUT VICRYL 4-0 PS2 18IN ABS (SUTURE) IMPLANT
SUT VICRYL AB 2 0 TIES (SUTURE) ×2 IMPLANT
SYR CONTROL 10ML LL (SYRINGE) ×1 IMPLANT
TAPE CLOTH SURG 4X10 WHT LF (GAUZE/BANDAGES/DRESSINGS) ×1 IMPLANT
TOWEL OR 17X24 6PK STRL BLUE (TOWEL DISPOSABLE) ×2 IMPLANT
TOWEL OR 17X26 10 PK STRL BLUE (TOWEL DISPOSABLE) ×2 IMPLANT
TRAY FOLEY CATH 14FR (SET/KITS/TRAYS/PACK) ×2 IMPLANT
WATER STERILE IRR 1000ML POUR (IV SOLUTION) ×2 IMPLANT
YANKAUER SUCT BULB TIP NO VENT (SUCTIONS) ×2 IMPLANT

## 2011-10-21 NOTE — Preoperative (Signed)
Beta Blockers   Reason not to administer Beta Blockers:Not Applicable 

## 2011-10-21 NOTE — Anesthesia Procedure Notes (Addendum)
Procedure Name: Intubation Date/Time: 10/21/2011 12:52 PM Performed by: Gwenyth Allegra Pre-anesthesia Checklist: Patient identified, Emergency Drugs available, Suction available and Patient being monitored Patient Re-evaluated:Patient Re-evaluated prior to inductionOxygen Delivery Method: Circle System Utilized Preoxygenation: Pre-oxygenation with 100% oxygen Intubation Type: IV induction Ventilation: Mask ventilation without difficulty Laryngoscope Size: Mac and 3 Grade View: Grade I Tube type: Oral Tube size: 7.5 mm Number of attempts: 1 Airway Equipment and Method: stylet Placement Confirmation: ETT inserted through vocal cords under direct vision,  positive ETCO2,  breath sounds checked- equal and bilateral and CO2 detector Secured at: 21 cm Tube secured with: Tape

## 2011-10-21 NOTE — Transfer of Care (Signed)
Immediate Anesthesia Transfer of Care Note  Patient: Caroline Conley  Procedure(s) Performed:  POSTERIOR LUMBAR FUSION 1 LEVEL - REVISION L5-S1 FUSION, REMOVAL BROKEN PEDICLE SCREW, BONE GRAFT  Patient Location: PACU  Anesthesia Type: General  Level of Consciousness: sedated  Airway & Oxygen Therapy: Patient Spontanous Breathing and Patient connected to nasal cannula oxygen  Post-op Assessment: Report given to PACU RN  Post vital signs: Reviewed and stable  Complications: No apparent anesthesia complications

## 2011-10-21 NOTE — H&P (Signed)
The patient has been re-examined, and the chart reviewed, and there have been no interval changes to the documented history and physical.    The risks, benefits, and alternatives have been discussed at length, and the patient is willing to proceed.   

## 2011-10-21 NOTE — Anesthesia Preprocedure Evaluation (Addendum)
Anesthesia Evaluation  Patient identified by MRN, date of birth, ID band Patient awake    Reviewed: Allergy & Precautions, H&P , NPO status , Patient's Chart, lab work & pertinent test results  Airway Mallampati: I TM Distance: >3 FB Neck ROM: Full    Dental  (+) Edentulous Upper and Edentulous Lower   Pulmonary Current Smoker,    Pulmonary exam normal       Cardiovascular Exercise Tolerance: Good Regular Normal    Neuro/Psych    GI/Hepatic   Endo/Other    Renal/GU      Musculoskeletal   Abdominal Normal abdominal exam  (+)   Peds  Hematology   Anesthesia Other Findings   Reproductive/Obstetrics                           Anesthesia Physical Anesthesia Plan  ASA: II  Anesthesia Plan: General   Post-op Pain Management:    Induction: Intravenous  Airway Management Planned: Oral ETT  Additional Equipment:   Intra-op Plan:   Post-operative Plan: Extubation in OR  Informed Consent: I have reviewed the patients History and Physical, chart, labs and discussed the procedure including the risks, benefits and alternatives for the proposed anesthesia with the patient or authorized representative who has indicated his/her understanding and acceptance.     Plan Discussed with: CRNA, Anesthesiologist and Surgeon  Anesthesia Plan Comments:         Anesthesia Quick Evaluation

## 2011-10-21 NOTE — Brief Op Note (Signed)
10/21/2011  4:12 PM  PATIENT:  Caroline Conley  45 y.o. female  PRE-OPERATIVE DIAGNOSIS:  L5-S1 PSEUDARTHROSIS BROKEN SCREW  POST-OPERATIVE DIAGNOSIS:  L5-S1 PSEUDARTHROSIS BROKEN SCREW  PROCEDURE:  Procedure(s): POSTERIOR LUMBAR FUSION 1 LEVEL,  Removal of broken pedicle screw and rods,  Left interbody local bone grafting, replacement left S1 pedicle screw and reinstrumentation and compression.  Crosslink bar. Left S1 foraminotomy, pedicle bone grafting .   SURGEON:  Surgeon(s): Eldred Manges  PHYSICIAN ASSISTANT: Maud Deed PA-C  Medically necessary and present for the entire procedure.   ASSISTANTS: none   ANESTHESIA:   general   EBL:  Total I/O In: 2200 [I.V.:2100; Blood:100] Out: 1000 [Urine:650; Blood:350]  BLOOD ADMINISTERED:100 CC CELLSAVER  DRAINS: none   LOCAL MEDICATIONS USED:  NONE  SPECIMEN:  No Specimen  DISPOSITION OF SPECIMEN:  N/A  COUNTS:  YES  TOURNIQUET:  * No tourniquets in log *  DICTATION: .Other Dictation: Dictation Number [id number]  PLAN OF CARE: Admit to inpatient   PATIENT DISPOSITION:  PACU - hemodynamically stable.   Delay start of Pharmacological VTE agent (>24hrs) due to surgical blood loss or risk of bleeding:  yes

## 2011-10-21 NOTE — Anesthesia Postprocedure Evaluation (Signed)
Anesthesia Post Note  Patient: Caroline Conley  Procedure(s) Performed:  POSTERIOR LUMBAR FUSION 1 LEVEL - REVISION L5-S1 FUSION, REMOVAL BROKEN PEDICLE SCREW, BONE GRAFT  Anesthesia type: General  Patient location: PACU  Post pain: Pain level controlled and Adequate analgesia  Post assessment: Post-op Vital signs reviewed, Patient's Cardiovascular Status Stable, Respiratory Function Stable, Patent Airway and Pain level controlled  Last Vitals:  Filed Vitals:   10/21/11 1645  BP:   Pulse:   Temp: 36.8 C  Resp:     Post vital signs: Reviewed and stable  Level of consciousness: awake, alert  and oriented  Complications: No apparent anesthesia complications

## 2011-10-21 NOTE — Op Note (Signed)
Caroline Conley, Caroline Conley                ACCOUNT NO.:  [id number]  MEDICAL RECORD NO.:  [id number]  LOCATION:  5018                         FACILITY:  [facility]  PHYSICIAN:  Longino Trefz C. Ophelia Charter, M.D.    DATE OF BIRTH:  03/21/1966  DATE OF PROCEDURE:  10/21/2011 DATE OF DISCHARGE:                              OPERATIVE REPORT   PREOPERATIVE DIAGNOSIS:  Status post L4-5 fusion 2008 with pseudoarthrosis and broken S1 pedicle screw.  POSTOPERATIVE DIAGNOSIS:  Status post L4-5 fusion 2008 with pseudoarthrosis and broken S1 pedicle screw.  PROCEDURE:  Exploration L5-S1 fusion , removal of broken pedicle screw. Local interbody bone grafting, new S1 pedicle screw position, and bone grafting of the pedicle defect.  SURGEON:  Eather Chaires C. Ophelia Charter, MD  ANESTHESIA:  GOT.  ASSISTANT:  Maud Deed, [facility], medically necessary and present for the entire procedure.  This 45 year old female had a 5-1 fusion in 2008, did well, not taking any pain medication, was working full shows regular days and was in normal activity.  She was helping up the neighbor move some furniture 6- 8 weeks ago and developed sharp onset of low back pain, difficulty getting upright, back pain, radicular leg pain down her right leg, electrical radiating down to her foot, difficulty standing, and inability to sleep.  She is taking narcotic pain medication regularly. Plain radiograph showed broken pedicle screw and CT scan demonstrated pseudoarthrosis with incomplete healing of the interbody.  There was some bone present in the cage, abundant bone around the front of the cage, some on the sides, and some evidence of fusion of the facet on the left side.  Right side was used for the TLIF.  PROCEDURE:  After the patient was placed on chest rolls prone, careful padding and positioning, Foley catheter was placed before she was turned.  Leg pumpers were placed for DVT prophylaxis.  Time-out procedure was completed after prepping and draping.  A  sterile skin marker was used in the old incision and Betadine, Steri-Drape.  All incision was opened.  Subperiosteal dissection down to the lamina.  A lamina below and above were identified and then progressing out laterally over the facet to the hardware.  Scar tissue was removed off of the caps and rods.  Caps were removed and once all screws were well visualized, 2 rods were removed.  On the right side there was some motion with a Cobb placed between the pedicle screws with twisting and torquing.  On the side of the broken pedicle screw, there was only trace motion.  Broken screw portion was removed and then a good hour or so was spent on removing the broken pedicle screw.  A 6 mm screw removal was used initially by hand and then on power after laminectomy and S1 was performed using Kerrison protecting the dura with patties, foraminotomy of the nerve root so that the pedicle was directly visualized and D'Errico was placed on the S1 nerve root underneath the pedicle since the screw was immediately adjacent to the pedicle.  With over drilling on outside of the screw just enough to catch it, the base of the pedicle bone pinned down so that the screw could be visualized.  Nerve root was protected through this and finally the screw was removed completely. Continued bone was removed on the lateral gutter.  Disk space was exposed.  Initially some portions of the anulus and disk was removed on the left side of some pieces of old bone graft which had not fused.  A #7 blade was inserted, girdled, and twisted and there was trace motion on this side.  This was just lateral to the cage and this gave an avenue for bone grafting and the bone that had been meticulously cleaned, was packed down with the bone impactor after 2 endplates have been freshened up.  After this was packed, a piece of Gelfoam was placed over the top to keep the pieces of the tiny bone graft inside.  New pedicle screw was then  placed in the S1 on the left side starting more laterally and then aiming slightly medial.  This was a 45, previous 40 mm 5.5 screw had come just medially to the anterior cortex.  Starting slight lateral, the screw could see passing into the old anterior plane at the tip and once the anterior edge of the screw was placed against the anterior cortex, it was just tightened 1 extra turn to penetrate 1/2 to 1 thread through the anterior cortex for excellent tight fit.  Remaining bone was then grafted down into the pedicle hole and D'Errico was used to check the nerve root to make sure none of the bone came out through the 2-3 mm defect at the base of the pedicle so that there would not be any bone graft adjacent to the nerve root irritated.  __________ was irrigated, dura was intact.  The patient had Cell Saver used and was retransfused less than 100 mL.  Bone over the facet was rongeured up on the left side and then a 45 mm rod was placed on the left, 40 mm on the right, tightened down.  There was 1 to 1.5 mm of compression able to be performed.  Once the right side was compressed first, the left side was then compressed down, locked, and a cross-link bar was placed since the new pedicle screw had been placed in S1 and bone grafting had been done to the pedicle for additional securement.  The patient is a smoker and has not been able to quit.  Copious irrigation was used.  The canal was checked down in the gutter to make sure there is no bone left in the canal.  Nerve root was free.  Bone had been packed into the interbody space locally without using a cage, was in satisfactory position.  The fascia was then closed over the top after 2 view C-arm spot films were taken showing good position and alignment.  A 0 Vicryl in the deep fascia, 2-0 Vicryl in subcutaneous tissue, and 3-0 Vicryl in subcuticular closure.     Shaliah Wann C. Ophelia Charter, M.D.     MCY/MEDQ  D:  10/21/2011  T:  10/21/2011  Job:   [id number]

## 2011-10-22 LAB — CBC
HCT: 29.3 % — ABNORMAL LOW (ref 36.0–46.0)
Hemoglobin: 10 g/dL — ABNORMAL LOW (ref 12.0–15.0)
RBC: 3.1 MIL/uL — ABNORMAL LOW (ref 3.87–5.11)
RDW: 12.3 % (ref 11.5–15.5)
WBC: 9.7 10*3/uL (ref 4.0–10.5)

## 2011-10-22 LAB — BASIC METABOLIC PANEL
BUN: 5 mg/dL — ABNORMAL LOW (ref 6–23)
CO2: 26 mEq/L (ref 19–32)
Chloride: 103 mEq/L (ref 96–112)
GFR calc Af Amer: >90 mL/min (ref >90)
Potassium: 3.7 mEq/L (ref 3.5–5.1)

## 2011-10-22 NOTE — Addendum Note (Signed)
Addendum  created 10/22/11 1339 by Rivka Barbara, MD   Modules edited:Notes Section

## 2011-10-22 NOTE — Progress Notes (Signed)
Subjective: 1 Day Post-Op Procedure(s) (LRB): POSTERIOR LUMBAR FUSION 1 LEVEL (N/A) Patient reports pain as moderate.   Using PCA Dilaudid.  Still with pain.  Had BM today  No nausea.  OOB with PT and walked in hallway and did stairs.  Voiding since cath removed. Objective: Vital signs in last 24 hours: Temp:  [97.3 F (36.3 C)-98.5 F (36.9 C)] 98.5 F (36.9 C) (11/06 1300) Pulse Rate:  [61-93] 93  (11/06 1300) Resp:  [8-20] 20  (11/06 1532) BP: (88-105)/(43-61) 88/58 mmHg (11/06 1300) SpO2:  [96 %-100 %] 97 % (11/06 1532) FiO2 (%):  [97 %] 97 % (11/06 0400)  Intake/Output from previous day: 11/05 0701 - 11/06 0700 In: 2450 [P.O.:250; I.V.:2100; Blood:100] Out: 3000 [Urine:2650; Blood:350] Intake/Output this shift:     Basename 10/22/11 0650  HGB 10.0*    Basename 10/22/11 0650  WBC 9.7  RBC 3.10*  HCT 29.3*  PLT 189    Basename 10/22/11 0650  NA 137  K 3.7  CL 103  CO2 26  BUN 5*  CREATININE 0.72  GLUCOSE 105*  CALCIUM 8.5   No results found for this basename: LABPT:2,INR:2 in the last 72 hours  Neurovascular intact Sensation intact distally Intact pulses distally Dorsiflexion/Plantar flexion intact Incision: dressing C/D/I  Assessment/Plan: 1 Day Post-Op Procedure(s) (LRB): POSTERIOR LUMBAR FUSION 1 LEVEL (N/A) Needs to be comfortable with PO meds.  She prefers to use PCA until tomorrow.  Will change over to PO meds in am.  Once pain controlled will dc to home.  Brace when OOB  Meet [place] M 10/22/2011, 5:05 PM

## 2011-10-22 NOTE — Anesthesia Postprocedure Evaluation (Signed)
  Anesthesia Post-op Note  Patient: Caroline Conley  Procedure(s) Performed:  POSTERIOR LUMBAR FUSION 1 LEVEL - REVISION L5-S1 FUSION, REMOVAL BROKEN PEDICLE SCREW, BONE GRAFT  Patient Location: PACU  Anesthesia Type: General  Level of Consciousness: awake, alert  and oriented  Airway and Oxygen Therapy: Patient connected to nasal cannula oxygen  Post-op Pain: mild  Post-op Assessment: Post-op Vital signs reviewed, Patient's Cardiovascular Status Stable, Respiratory Function Stable, Patent Airway and No signs of Nausea or vomiting  Post-op Vital Signs: stable  Complications: No apparent anesthesia complications

## 2011-10-22 NOTE — Progress Notes (Addendum)
Physical Therapy Evaluation Patient Details Name: Caroline Conley MRN: [id number] DOB: 06-Jan-1966 Today's Date: 10/22/2011  Problem List: There is no problem list on file for this patient.   Past Medical History: History reviewed. No pertinent past medical history. Past Surgical History:  Past Surgical History  Procedure Date  . Rotator cuff repair   . Debridement tennis elbow   . Appendectomy   . Tonsillectomy   . Abdominal hysterectomy   . Back surgery     this will make third time    PT Assessment/Plan/Recommendation PT Assessment Clinical Impression Statement: Pt presents with a medical diagnosis of lumbar fusion revision along with the impairments/deficits and therapy diagnosis listed below. Patient will benefit from skilled PT services in the acute care setting to maximize functional mobility for a safe d/c home with supervision PT Recommendation/Assessment: Patient will need skilled PT in the acute care venue PT Problem List: Decreased knowledge of use of DME;Decreased activity tolerance;Decreased mobility;Decreased knowledge of precautions Barriers to Discharge: None PT Therapy Diagnosis : Difficulty walking;Acute pain PT Plan PT Treatment/Interventions: Gait training;DME instruction;Functional mobility training;Stair training;Therapeutic exercise;Patient/family education Frequency: 7days/week PT Recommendation Follow Up Recommendations: Home health PT Equipment Recommended: None recommended by PT  PT Goals  Acute Rehab PT Goals PT Goal Formulation: With patient Time For Goal Achievement: 7 days Pt will Roll Supine to Right Side: with modified independence PT Goal: Rolling Supine to Right Side - Progress: Progressing toward goal Pt will Roll Supine to Left Side: with modified independence PT Goal: Rolling Supine to Left Side - Progress: Progressing toward goal Pt will go Supine/Side to Sit: with modified independence PT Goal: Supine/Side to Sit - Progress:  Progressing toward goal Pt will Transfer Sit to Stand/Stand to Sit: with modified independence PT Transfer Goal: Sit to Stand/Stand to Sit - Progress: Progressing toward goal Pt will Transfer Bed to Chair/Chair to Bed: with modified independence PT Transfer Goal: Bed to Chair/Chair to Bed - Progress: Progressing toward goal Pt will Ambulate: >150 feet;with modified independence PT Goal: Ambulate - Progress: Progressing toward goal Pt will Go Up / Down Stairs: 3-5 stairs;with min assist PT Goal: Up/Down Stairs - Progress: Progressing toward goal Additional Goals Additional Goal #1: Pt will be able to verbalize 3/3 back precautions and follow 100% of the time PT Goal: Additional Goal #1 - Progress: Progressing toward goal  PT Evaluation Precautions/Restrictions  Precautions Precautions: Back Prior Functioning  Home Living Lives With: Spouse Receives Help From: Family Type of Home: House Home Layout: Two level;Able to live on main level with bedroom/bathroom Alternate Level Stairs-Rails: Right Alternate Level Stairs-Number of Steps: 15 Home Access: Stairs to enter Entrance Stairs-Rails: None Entrance Stairs-Number of Steps: 3 Bathroom Shower/Tub: Engineer, manufacturing systems: Standard Bathroom Accessibility: No Home Adaptive Equipment: Walker - rolling Prior Function Level of Independence: Independent with basic ADLs;Independent with homemaking with ambulation;Independent with gait;Independent with transfers Driving: Yes Vocation: Full time employment Cognition Cognition Arousal/Alertness: Awake/alert Overall Cognitive Status: Appears within functional limits for tasks assessed Orientation Level: Oriented X4 Sensation/Coordination Sensation Light Touch: Appears Intact Extremity Assessment RLE Assessment RLE Assessment: Within Functional Limits LLE Assessment LLE Assessment: Within Functional Limits Mobility (including Balance) Bed Mobility Bed Mobility: Yes Rolling  Left: 5: Supervision Rolling Left Details (indicate cue type and reason): VC to maintain hip precautions with log rolling Left Sidelying to Sit: 5: Supervision;HOB elevated (comment degrees) (30) Left Sidelying to Sit Details (indicate cue type and reason): VC for sequencing to maintain back precautions Sitting - Scoot to  Edge of Bed: 6: Modified independent (Device/Increase time) Transfers Transfers: Yes Sit to Stand: 4: Min assist Sit to Stand Details (indicate cue type and reason): VC for hand placement for safety up to RW Stand to Sit: 4: Min assist Stand to Sit Details: VC for hand placement Ambulation/Gait Ambulation/Gait: Yes Ambulation/Gait Assistance: 4: Min assist Ambulation/Gait Assistance Details (indicate cue type and reason): VC to maintain close distance to RW for safety and for postural cueing Assistive device: Rolling walker Gait Pattern: Within Functional Limits Gait velocity: Decreased gait speed Stairs: Yes Stairs Assistance: 3: Mod assist Stairs Assistance Details (indicate cue type and reason): VC for gait sequencing on stairs. Hand held assist for balance with stairs. Husband educated on proper supervision during stairs Stair Management Technique: No rails Number of Stairs: 3     Exercise    End of Session PT - End of Session Equipment Utilized During Treatment: Gait belt Activity Tolerance: Patient tolerated treatment well Patient left: in chair Nurse Communication: Mobility status for ambulation General Behavior During Session: [facility] for tasks performed Cognition: [facility] for tasks performed  Milana Kidney 10/22/2011, 8:56 AM

## 2011-10-23 LAB — CBC
HCT: 29.9 % — ABNORMAL LOW (ref 36.0–46.0)
Hemoglobin: 10.1 g/dL — ABNORMAL LOW (ref 12.0–15.0)
RBC: 3.19 MIL/uL — ABNORMAL LOW (ref 3.87–5.11)

## 2011-10-23 MED ORDER — OXYCODONE-ACETAMINOPHEN 5-325 MG PO TABS: 1.0000 | ORAL_TABLET | ORAL | Status: AC | PRN

## 2011-10-23 MED ORDER — METHOCARBAMOL 500 MG PO TABS: 500.0000 mg | ORAL_TABLET | Freq: Four times a day (QID) | ORAL | Status: AC | PRN

## 2011-10-23 NOTE — Progress Notes (Signed)
Physical Therapy Treatment Patient Details Name: Caroline Conley MRN: [id number] DOB: Apr 02, 1966 Today's Date: 10/23/2011  PT Assessment/Plan  PT - Assessment/Plan Comments on Treatment Session: Pt is I with basic mobility and is cleared to D/C home today. Pt did need some reinforcement to remember 2/3 back precautions. Provided pt a back handout for reinforcement. Pt will have HHPT to reinforce precautions. PT Plan: Other (comment) (Pt d/c home now so reviewed techniques and HHPT to follow-up) PT Goals  Additional Goals PT Goal: Additional Goal #1 - Progress: Partly met (Pt able to recall 1/3 Back precautions)  PT Treatment Precautions/Restrictions  Precautions Precautions: Back Precaution Comments: Provided a back precaution handout and review back precautions Required Braces or Orthoses: Yes Spinal Brace: Lumbar corset;Applied in sitting position Restrictions Weight Bearing Restrictions: No Mobility (including Balance) Bed Mobility Bed Mobility: No Rolling Left Details (indicate cue type and reason): Verbally reviewed technique for log rolling, pt reported she is I in the room with mobility. Transfers Transfers: No Sit to Stand Details (indicate cue type and reason): Pt reports she is I. Observed pt up and ambulating in the halls I Ambulation/Gait Ambulation/Gait: No Ambulation/Gait Assistance Details (indicate cue type and reason): Observed pt ambulating in hall I    Exercise    End of Session PT - End of Session Equipment Utilized During Treatment: Back brace (therapist reviewed Don/Doffing brace) Activity Tolerance: Other (comment) (pt cleared for D/C so verbally reviewed precautions.) Nurse Communication: Other (comment) (Pt is I and ready for D/C today) General Behavior During Session: [facility] for tasks performed Cognition: [facility] for tasks performed  Greggory Stallion 10/23/2011, 11:08 AM

## 2011-10-23 NOTE — Discharge Summary (Signed)
Physician Discharge Summary  Patient ID: LAQUAN LUDDEN MRN: [id number] DOB/AGE: [birth year] 46 y.o.  Admit date: 10/21/2011 Discharge date: 10/23/2011  Admission Diagnoses:Loosened hardware Lumbar fusion L5-S1 Discharge Diagnoses: same Active Problems:  * No active hospital problems. *    Discharged Condition: stable  Hospital Course: Ambulated in hallway with therapy and did stairs.  Pain controlled with PCA meds and weaned to po meds.  Wound healing well. Taking regular diet .  Voiding and having bowel movements.  Consults: none  Significant Diagnostic Studies: none  Treatments: therapies: PT  Discharge Exam: Blood pressure 107/66, pulse 96, temperature 99 F (37.2 C), temperature source Oral, resp. rate 18, SpO2 96.00%. Extremities: NV and motor function intact.  Wound dry and clean  Disposition:   Discharge Orders    Future Orders Please Complete By Expires   Diet - low sodium heart healthy      Call MD / Call 911      Comments:   If you experience chest pain or shortness of breath, CALL 911 and be transported to the hospital emergency room.  If you develope a fever above 101 F, pus (white drainage) or increased drainage or redness at the wound, or calf pain, call your surgeon's office.   Constipation Prevention      Comments:   Drink plenty of fluids.  Prune juice may be helpful.  You may use a stool softener, such as Colace (over the counter) 100 mg twice a day.  Use MiraLax (over the counter) for constipation as needed.   Increase activity slowly as tolerated      Weight Bearing as taught in Physical Therapy      Comments:   Use a walker or crutches as instructed.   Discharge instructions      Comments:   **wear brace at all times when out of bed.   Keep wound dry and clean.  May shower at home and replace dressing as needed   Driving restrictions      Comments:   No driving for 2 weeks   Lifting restrictions      Comments:   No lifting for 6 weeks      Current Discharge Medication List    START taking these medications   Details  methocarbamol (ROBAXIN) 500 MG tablet Take 1 tablet (500 mg total) by mouth every 6 (six) hours as needed (spasm). Qty: 40 tablet, Refills: 0    oxyCODONE-acetaminophen (PERCOCET) 5-325 MG per tablet Take 1-2 tablets by mouth every 4 (four) hours as needed for pain. Qty: 60 tablet, Refills: 0      STOP taking these medications     HYDROcodone-acetaminophen (NORCO) 7.5-325 MG per tablet        Follow-up Information    Follow up with YATES,MARK C. Make an appointment in 1 week.   Contact information:   [facility] [address] [place] [address] [phone]          Signed: Wende Neighbors 10/23/2011, 8:42 AM

## 2011-10-23 NOTE — Progress Notes (Signed)
Subjective: 2 Days Post-Op Procedure(s) (LRB): POSTERIOR LUMBAR FUSION 1 LEVEL (N/A) Patient reports pain as   Moderate back pain. Right leg pain from preop gone. Left leg pain improved. NVI no weakness.     Objective: Vital signs in last 24 hours: Temp:  [98.5 F (36.9 C)-99.6 F (37.6 C)] 99 F (37.2 C) (11/07 0611) Pulse Rate:  [93-108] 96  (11/07 0611) Resp:  [18-20] 18  (11/07 0611) BP: (88-107)/(58-66) 107/66 mmHg (11/07 0611) SpO2:  [95 %-100 %] 96 % (11/07 0611)  Intake/Output from previous day:   Intake/Output this shift:     Basename 10/23/11 0640 10/22/11 0650  HGB 10.1* 10.0*    Basename 10/23/11 0640 10/22/11 0650  WBC 11.4* 9.7  RBC 3.19* 3.10*  HCT 29.9* 29.3*  PLT 185 189    Basename 10/22/11 0650  NA 137  K 3.7  CL 103  CO2 26  BUN 5*  CREATININE 0.72  GLUCOSE 105*  CALCIUM 8.5   No results found for this basename: LABPT:2,INR:2 in the last 72 hours  {physical exam:   EHL AT  GS 5/5  Assessment/Plan: 2 Days Post-Op Procedure(s) (LRB): POSTERIOR LUMBAR FUSION 1 LEVEL (N/A) {Plan:      Brace,  Ambulation.  Home late today or tomorrow when OK with PT goals.  D/C IV dressing change  Zamaria Brazzle C 10/23/2011, 8:09 AM

## 2011-10-24 MED FILL — Sodium Chloride IV Soln 0.9%: INTRAVENOUS | Qty: 1000 | Status: AC

## 2011-10-24 MED FILL — Sodium Chloride Irrigation Soln 0.9%: Qty: 3000 | Status: AC

## 2011-10-24 MED FILL — Heparin Sodium (Porcine) Inj 1000 Unit/ML: INTRAMUSCULAR | Qty: 30 | Status: AC

## 2011-12-31 ENCOUNTER — Ambulatory Visit: Payer: Self-pay | Admitting: Family Medicine

## 2012-01-13 ENCOUNTER — Ambulatory Visit: Payer: Self-pay | Admitting: Family Medicine

## 2012-01-22 ENCOUNTER — Ambulatory Visit: Payer: BC Managed Care – PPO | Admitting: Internal Medicine

## 2012-01-22 DIAGNOSIS — Z0289 Encounter for other administrative examinations: Secondary | ICD-10-CM

## 2012-04-03 ENCOUNTER — Ambulatory Visit: Payer: Self-pay | Admitting: Sports Medicine

## 2012-04-09 ENCOUNTER — Emergency Department: Payer: Self-pay | Admitting: Emergency Medicine

## 2012-04-10 ENCOUNTER — Emergency Department: Payer: Self-pay | Admitting: Emergency Medicine

## 2012-04-10 LAB — BASIC METABOLIC PANEL
Calcium, Total: 9.3 mg/dL (ref 8.5–10.1)
Chloride: 105 mmol/L (ref 98–107)
Chloride: 106 mmol/L (ref 98–107)
Co2: 30 mmol/L (ref 21–32)
Creatinine: 0.86 mg/dL (ref 0.60–1.30)
EGFR (African American): >60
EGFR (African American): >60
EGFR (Non-African Amer.): >60
Glucose: 89 mg/dL (ref 65–99)
Osmolality: 280 (ref 275–301)
Sodium: 141 mmol/L (ref 136–145)

## 2012-04-10 LAB — CBC
HCT: 37.8 % (ref 35.0–47.0)
HCT: 38.6 % (ref 35.0–47.0)
HGB: 12.8 g/dL (ref 12.0–16.0)
MCH: 31.7 pg (ref 26.0–34.0)
MCHC: 33.5 g/dL (ref 32.0–36.0)
MCV: 94 fL (ref 80–100)
RBC: 4.04 10*6/uL (ref 3.80–5.20)
RDW: 12.7 % (ref 11.5–14.5)
WBC: 10.9 10*3/uL (ref 3.6–11.0)
WBC: 9.8 10*3/uL (ref 3.6–11.0)

## 2012-04-10 LAB — TROPONIN I: Troponin-I: <0.02 ng/mL

## 2012-04-15 ENCOUNTER — Other Ambulatory Visit: Payer: Self-pay | Admitting: Surgery

## 2012-04-15 LAB — CBC WITH DIFFERENTIAL/PLATELET
HCT: 41.2 % (ref 35.0–47.0)
Lymphocyte #: 3 10*3/uL (ref 1.0–3.6)
Lymphocyte %: 32.3 %
MCV: 95 fL (ref 80–100)
Monocyte #: 0.7 x10 3/mm (ref 0.2–0.9)
Monocyte %: 8 %
RBC: 4.31 10*6/uL (ref 3.80–5.20)
RDW: 12.3 % (ref 11.5–14.5)
WBC: 9.3 10*3/uL (ref 3.6–11.0)

## 2012-04-15 LAB — COMPREHENSIVE METABOLIC PANEL
Albumin: 4 g/dL (ref 3.4–5.0)
Alkaline Phosphatase: 109 U/L (ref 50–136)
Chloride: 106 mmol/L (ref 98–107)
EGFR (Non-African Amer.): >60
Potassium: 4.6 mmol/L (ref 3.5–5.1)
SGOT(AST): 24 U/L (ref 15–37)
SGPT (ALT): 22 U/L
Sodium: 138 mmol/L (ref 136–145)
Total Protein: 7.5 g/dL (ref 6.4–8.2)

## 2012-04-16 ENCOUNTER — Ambulatory Visit: Payer: Self-pay | Admitting: Surgery

## 2012-05-04 ENCOUNTER — Ambulatory Visit (INDEPENDENT_AMBULATORY_CARE_PROVIDER_SITE_OTHER): Payer: BC Managed Care – PPO | Admitting: Surgery

## 2012-05-13 ENCOUNTER — Encounter (INDEPENDENT_AMBULATORY_CARE_PROVIDER_SITE_OTHER): Payer: Self-pay | Admitting: Surgery

## 2012-05-29 ENCOUNTER — Encounter (INDEPENDENT_AMBULATORY_CARE_PROVIDER_SITE_OTHER): Payer: Self-pay

## 2012-07-05 ENCOUNTER — Encounter (HOSPITAL_COMMUNITY): Payer: Self-pay | Admitting: Physical Medicine and Rehabilitation

## 2012-07-05 ENCOUNTER — Emergency Department (HOSPITAL_COMMUNITY)
Admission: EM | Admit: 2012-07-05 | Discharge: 2012-07-06 | Disposition: A | Discharge status: Home / Self Care | Payer: Self-pay | Attending: Emergency Medicine | Admitting: Emergency Medicine

## 2012-07-05 DIAGNOSIS — S39012A Strain of muscle, fascia and tendon of lower back, initial encounter: Secondary | ICD-10-CM

## 2012-07-05 DIAGNOSIS — F17200 Nicotine dependence, unspecified, uncomplicated: Secondary | ICD-10-CM | POA: Insufficient documentation

## 2012-07-05 DIAGNOSIS — S335XXA Sprain of ligaments of lumbar spine, initial encounter: Secondary | ICD-10-CM | POA: Insufficient documentation

## 2012-07-05 DIAGNOSIS — W1809XA Striking against other object with subsequent fall, initial encounter: Secondary | ICD-10-CM | POA: Insufficient documentation

## 2012-07-05 DIAGNOSIS — Y998 Other external cause status: Secondary | ICD-10-CM | POA: Insufficient documentation

## 2012-07-05 DIAGNOSIS — Y9301 Activity, walking, marching and hiking: Secondary | ICD-10-CM | POA: Insufficient documentation

## 2012-07-05 DIAGNOSIS — G8929 Other chronic pain: Secondary | ICD-10-CM | POA: Insufficient documentation

## 2012-07-05 MED ORDER — CYCLOBENZAPRINE HCL 10 MG PO TABS: 10.0000 mg | ORAL_TABLET | Freq: Two times a day (BID) | ORAL | Status: AC | PRN

## 2012-07-05 MED ORDER — HYDROCODONE-ACETAMINOPHEN 5-325 MG PO TABS: 1.0000 | ORAL_TABLET | Freq: Four times a day (QID) | ORAL | Status: AC | PRN

## 2012-07-05 MED ORDER — HYDROMORPHONE HCL PF 2 MG/ML IJ SOLN: 2.0000 mg | Freq: Once | INTRAMUSCULAR | Status: DC

## 2012-07-05 MED ORDER — HYDROMORPHONE HCL PF 2 MG/ML IJ SOLN
INTRAMUSCULAR | Status: AC
  Filled 2012-07-05: qty 1

## 2012-07-05 MED ORDER — NAPROXEN 500 MG PO TABS: 500.0000 mg | ORAL_TABLET | Freq: Two times a day (BID) | ORAL | Status: AC

## 2012-07-05 NOTE — ED Notes (Signed)
Pt presents to department for evaluation of fall and R leg pain. States she fell in hole walking outside this afternoon. Now states pain to R leg radiating up to buttock area. No obvious deformities noted. No swelling noted. Sensation and movement intact to R leg. She is alert and oriented x4. No signs of acute distress.
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
## 2012-07-06 NOTE — ED Notes (Signed)
See downtime charting. 

## 2012-10-13 ENCOUNTER — Ambulatory Visit: Payer: Self-pay | Admitting: Orthopedic Surgery

## 2012-12-13 IMAGING — CR DG CHEST 2V
2 series · 2 of 2 positions shown · non-contrast
Comparison: 01/29/2007

CLINICAL DATA: Smoker, L5-S1 arthritis, broken screw

CHEST - 2 VIEW

[view not recorded (1 of 2)]
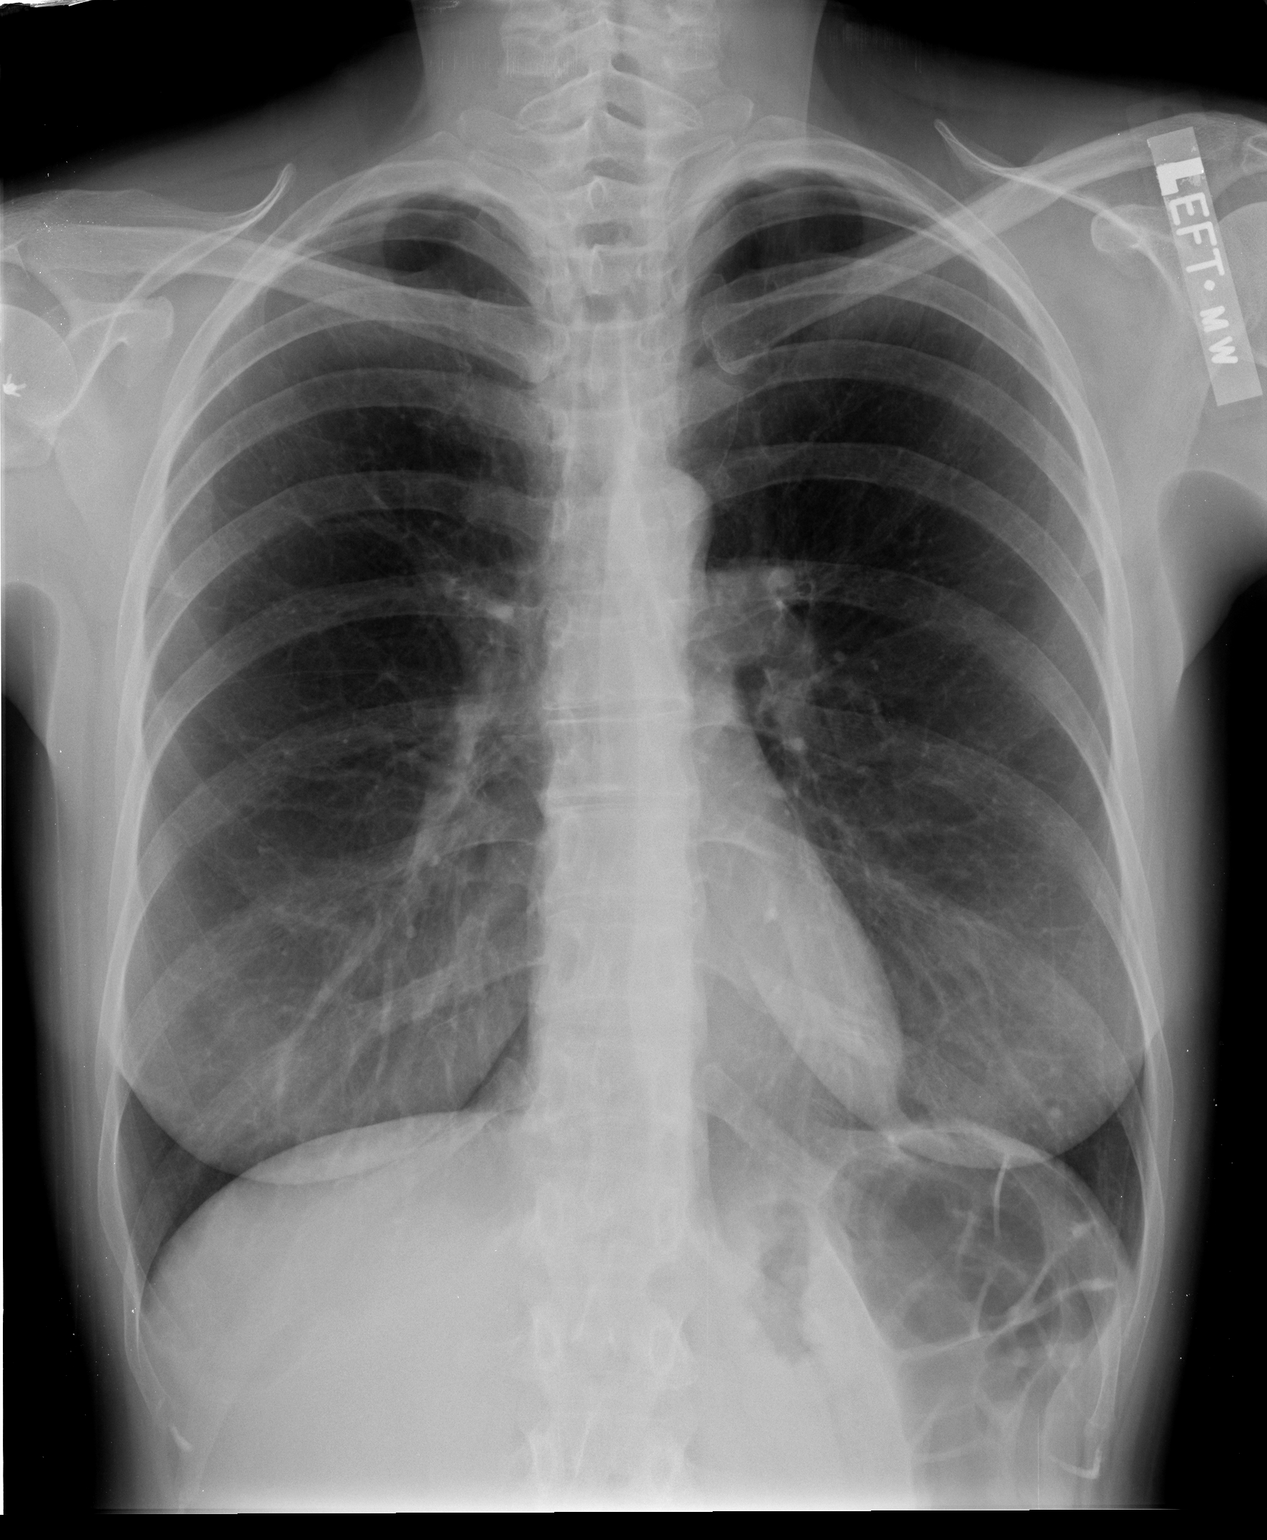

[view not recorded (2 of 2)]
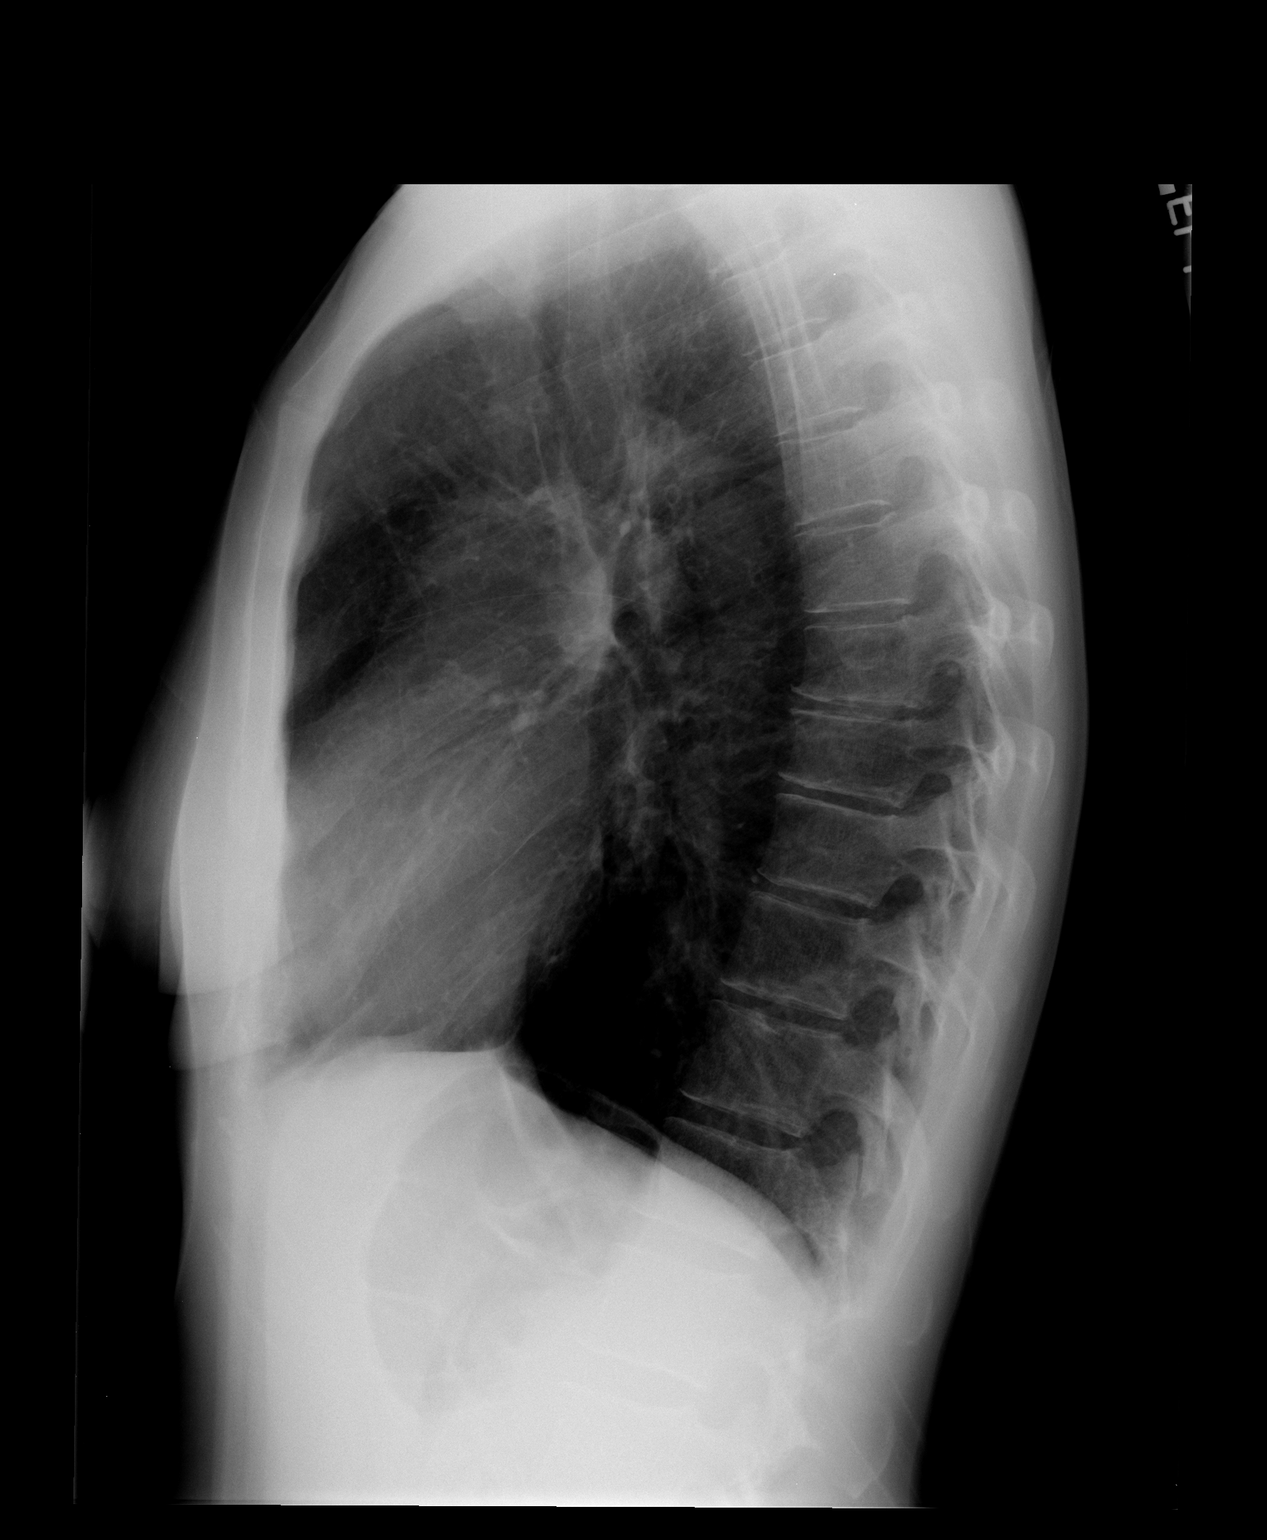

[2 of 2 positions shown; findings below may reference images not displayed]

FINDINGS: Normal heart size, mediastinal contours, and pulmonary vascularity.
Emphysematous and chronic bronchitic changes.
No pulmonary infiltrate, pleural effusion or pneumothorax.
No acute osseous findings.
IMPRESSION: Emphysematous and chronic bronchitic changes.
No acute abnormalities.

## 2013-05-03 ENCOUNTER — Emergency Department: Payer: Self-pay | Admitting: Emergency Medicine

## 2013-05-18 HISTORY — PX: METATARSAL OSTEOTOMY: SHX1641

## 2013-08-31 ENCOUNTER — Ambulatory Visit: Payer: Self-pay | Admitting: Family Medicine

## 2013-10-28 ENCOUNTER — Encounter: Payer: Self-pay | Admitting: Podiatry

## 2013-10-29 ENCOUNTER — Ambulatory Visit: Payer: Self-pay | Admitting: Podiatry

## 2014-06-24 ENCOUNTER — Ambulatory Visit: Payer: Self-pay | Admitting: Family Medicine

## 2014-08-21 ENCOUNTER — Ambulatory Visit: Payer: Self-pay | Admitting: Physician Assistant

## 2014-09-07 DIAGNOSIS — K30 Functional dyspepsia: Secondary | ICD-10-CM | POA: Insufficient documentation

## 2014-09-07 DIAGNOSIS — R1013 Epigastric pain: Secondary | ICD-10-CM | POA: Insufficient documentation

## 2014-09-16 ENCOUNTER — Ambulatory Visit: Payer: Self-pay | Admitting: Gastroenterology

## 2014-09-16 HISTORY — PX: ESOPHAGOGASTRODUODENOSCOPY: SHX1529

## 2014-09-19 LAB — PATHOLOGY REPORT

## 2014-09-20 ENCOUNTER — Ambulatory Visit: Payer: Self-pay | Admitting: Internal Medicine

## 2014-09-20 HISTORY — PX: CARDIAC CATHETERIZATION: SHX172

## 2014-09-23 ENCOUNTER — Ambulatory Visit: Payer: BC Managed Care – PPO | Admitting: Podiatry

## 2014-09-23 ENCOUNTER — Emergency Department: Payer: Self-pay | Admitting: Emergency Medicine

## 2014-09-23 LAB — CBC
HCT: 37.6 % (ref 35.0–47.0)
HGB: 12.6 g/dL (ref 12.0–16.0)
MCH: 31.6 pg (ref 26.0–34.0)
MCHC: 33.5 g/dL (ref 32.0–36.0)
MCV: 94 fL (ref 80–100)
PLATELETS: 260 10*3/uL (ref 150–440)
RBC: 3.99 10*6/uL (ref 3.80–5.20)
RDW: 12.8 % (ref 11.5–14.5)
WBC: 10.2 10*3/uL (ref 3.6–11.0)

## 2014-09-23 LAB — BASIC METABOLIC PANEL
ANION GAP: 7 (ref 7–16)
BUN: 5 mg/dL — AB (ref 7–18)
CALCIUM: 8.5 mg/dL (ref 8.5–10.1)
CHLORIDE: 107 mmol/L (ref 98–107)
CO2: 26 mmol/L (ref 21–32)
Creatinine: 1.18 mg/dL (ref 0.60–1.30)
EGFR (African American): >60
EGFR (Non-African Amer.): 52 — ABNORMAL LOW
GLUCOSE: 92 mg/dL (ref 65–99)
OSMOLALITY: 276 (ref 275–301)
POTASSIUM: 3.6 mmol/L (ref 3.5–5.1)
SODIUM: 140 mmol/L (ref 136–145)

## 2014-09-23 LAB — TROPONIN I: Troponin-I: <0.02 ng/mL

## 2014-09-23 LAB — LIPASE, BLOOD: LIPASE: 110 U/L (ref 73–393)

## 2014-10-11 ENCOUNTER — Ambulatory Visit: Payer: BC Managed Care – PPO | Admitting: Podiatry

## 2015-01-03 ENCOUNTER — Emergency Department: Payer: Self-pay | Admitting: Internal Medicine

## 2015-02-02 DIAGNOSIS — D239 Other benign neoplasm of skin, unspecified: Secondary | ICD-10-CM

## 2015-02-02 HISTORY — DX: Other benign neoplasm of skin, unspecified: D23.9

## 2015-04-03 ENCOUNTER — Emergency Department
Admit: 2015-04-03 | Disposition: A | Discharge status: Home / Self Care | Payer: Self-pay | Admitting: Emergency Medicine

## 2015-04-09 NOTE — Op Note (Signed)
PATIENT NAME:  Caroline Conley, Caroline Conley MR#:  [id number] DATE OF BIRTH:  1966-01-09  DATE OF PROCEDURE:  04/16/2012  PREOPERATIVE DIAGNOSIS: Symptomatic cholelithiasis.   POSTOPERATIVE DIAGNOSIS: Symptomatic cholelithiasis.   PROCEDURE: Laparoscopic cholecystectomy.   SURGEON: Phoebe Perch, MD   ANESTHESIA: General with endotracheal tube.   INDICATIONS: This is a patient with recurrent epigastric and right upper quadrant pain associated with fatty food intolerance and workup showing gallstones. Preoperatively, we discussed the rationale for surgery, the options of observation, risks of bleeding, infection, recurrence of symptoms, failure to resolve her symptoms, open procedure, bile duct damage, bile duct leak, retained common bile duct stone, any of which could require further surgery and/or ERCP, stent, and papillotomy. This was reviewed for them in the preop holding area. They understood and agreed to proceed.   FINDINGS: Small gallstones, small cystic duct, adhesions to the anterior surface of the gallbladder.   DESCRIPTION OF PROCEDURE: The patient was induced to general anesthesia, given IV antibiotics. VTE prophylaxis was in place. She was prepped and draped in a sterile fashion. Marcaine was infiltrated in the skin and subcutaneous tissues around the supraumbilical area, avoiding prior infraumbilical scars. Local anesthetic was infiltrated. Then an incision was made and a Veress needle was placed. Pneumoperitoneum was obtained. A 5 mm trocar port was placed. The abdominal cavity was explored. There was no sign of bowel injury or adhesions in the periumbilical area.   Under direct vision, a 12 mm port was placed in the epigastric site (a 10 mm port could not be used as would commonly be used due to the recall of the 10 mm clip applier by [employer]; therefore, a 12 mm was utilized to facilitate placement of the larger clip.)  Once the 12 mm port was placed, two lateral 5 mm ports were placed in the  right upper quadrant, again with direct vision. The gallbladder was placed on tension. Adhesions were taken down with sharp dissection and blunt dissection. No energy was utilized.   The peritoneum over the infundibulum was incised bluntly. The cystic duct-gallbladder junction was well identified. The cystic lymphatics were doubly clipped and divided, and this allowed for good visualization of the cystic duct as it entered the infundibulum of the gallbladder. Here it was doubly clipped and divided, and then the cystic artery was doubly clipped and divided. The gallbladder was taken from the gallbladder fossa with electrocautery and passed out through the epigastric port site with the aid of an Endo Catch bag. The area was checked for hemostasis and found to be adequate. There was no sign of bowel injury, bile leak or bleeding. The camera was placed in the epigastric site to view back to the periumbilical site. There was no sign of adhesions or bowel injury. Therefore, pneumoperitoneum was released. All ports were removed. Fascial edges at the epigastric site were approximated with figure-of-eight 0 Vicryl, and 4-0 subcuticular Monocryl was used at all skin edges. Steri-Strips, Mastisol, and sterile dressings were placed.   The patient tolerated the procedure well. There were no complications. She was taken to the recovery room in stable condition to be discharged in the care of her family. Follow-up in 10 days.    ____________________________ Jerrol Banana Burt Knack, MD rec:cbb D: 04/16/2012 14:09:08 ET T: 04/16/2012 17:30:04 ET JOB#: [id number]  cc: Jerrol Banana. Burt Knack, MD, <Dictator> Florene Glen MD ELECTRONICALLY SIGNED 04/16/2012 18:40

## 2015-04-09 NOTE — H&P (Signed)
PATIENT NAME:  Caroline Conley, SCOTTI MR#:  [id number] DATE OF BIRTH:  04/17/1966  DATE OF ADMISSION:  04/16/2012  CHIEF COMPLAINT: Right upper quadrant pain.   HISTORY OF PRESENT ILLNESS: This is a patient with unrelenting right upper quadrant pain. She became ill on Friday or Saturday and was in the Emergency Room Saturday or Sunday where work-up showed gallstones without sign of acute cholecystitis, but her pain has not gone away. She has had nausea and multiple emesis. She thinks she had a fever but did not take her temperature. She denies jaundice or acholic stools.   She has never had an episode like this before Friday or Saturday.   PAST MEDICAL HISTORY: None.   PAST SURGICAL HISTORY:  1. Hysterectomy. One ovary is still in place. 2. Appendectomy for ruptured appendix. 3. Knee surgery. 4. Shoulder surgery. 5. Back surgery multiple times.  FAMILY HISTORY: Noncontributory.   SOCIAL HISTORY: The patient smokes tobacco. She does not drink much alcohol. She works as a Dispensing optician doing heavy lifting.  REVIEW OF SYSTEMS: Ten system review has been performed and negative with the exception of that mentioned in the history of present illness.   PHYSICAL EXAMINATION:   GENERAL: Healthy thin-appearing Caucasian female patient.   HEENT: No scleral icterus.   NECK: No palpable neck nodes.   CHEST: Clear to auscultation.   CARDIAC: Regular rate and rhythm.   ABDOMEN: Soft and nondistended. Tender in the right upper quadrant minimally. Negative Murphy's sign.   EXTREMITIES: No edema. Calves are nontender.   NEUROLOGIC: Grossly intact.   INTEGUMENT: No jaundice.   LABS/STUDIES: An ultrasound shows a possible polyp versus adherent stone. No gallbladder wall thickening.   Laboratory values are within normal limits. LFTs will be repeated.   ASSESSMENT AND PLAN: This is a patient with symptomatic cholelithiasis. Her pain is unrelenting suggesting acute cholecystitis. Her  abdominal exam is not very tender with respect to a Murphy's sign, but I am recommending laparoscopic cholecystectomy for control of her symptoms. The options have been discussed. The rationale has been reviewed and the risks of bleeding, infection, recurrence of symptoms, failure to resolve her symptoms, open procedure, bile duct damage, bile duct leak, and retained common bile duct stone any of which could require further surgery and/or ERCP, stent, and papillotomy were all reviewed with her and her family. She understood and agreed to proceed. ____________________________ Jerrol Banana Burt Knack, MD rec:slb D: 04/15/2012 15:19:00 ET    T: 04/15/2012 15:31:44 ET         JOB#: [id number]  cc: Jerrol Banana. Burt Knack, MD, <Dictator> Florene Glen MD ELECTRONICALLY SIGNED 04/16/2012 14:24

## 2015-07-13 ENCOUNTER — Ambulatory Visit: Payer: Self-pay | Admitting: Family Medicine

## 2015-08-24 DIAGNOSIS — C439 Malignant melanoma of skin, unspecified: Secondary | ICD-10-CM

## 2015-08-24 HISTORY — DX: Malignant melanoma of skin, unspecified: C43.9

## 2015-08-30 DIAGNOSIS — C4359 Malignant melanoma of other part of trunk: Secondary | ICD-10-CM | POA: Insufficient documentation

## 2015-10-16 ENCOUNTER — Ambulatory Visit (INDEPENDENT_AMBULATORY_CARE_PROVIDER_SITE_OTHER): Payer: BLUE CROSS/BLUE SHIELD | Admitting: Family Medicine

## 2015-10-16 ENCOUNTER — Encounter: Payer: Self-pay | Admitting: Family Medicine

## 2015-10-16 VITALS — BP 115/85 | HR 102 | Temp 98.1°F | Resp 16 | Ht 66.0 in | Wt 132.6 lb

## 2015-10-16 DIAGNOSIS — J40 Bronchitis, not specified as acute or chronic: Secondary | ICD-10-CM | POA: Diagnosis not present

## 2015-10-16 DIAGNOSIS — F17200 Nicotine dependence, unspecified, uncomplicated: Secondary | ICD-10-CM

## 2015-10-16 DIAGNOSIS — Z720 Tobacco use: Secondary | ICD-10-CM

## 2015-10-16 MED ORDER — BENZONATATE 200 MG PO CAPS: 200.0000 mg | ORAL_CAPSULE | Freq: Two times a day (BID) | ORAL | Status: DC | PRN

## 2015-10-16 MED ORDER — DOXYCYCLINE HYCLATE 100 MG PO CAPS: 100.0000 mg | ORAL_CAPSULE | Freq: Two times a day (BID) | ORAL | Status: DC

## 2015-10-16 NOTE — Progress Notes (Signed)
Date:  10/16/2015   Name:  Caroline Conley   DOB:  01/17/1966   MRN:  [id number]  PCP:  Dicky Doe, MD    Chief Complaint: Cough   History of Present Illness:  This is a 49 y.o. female with 4d hx of cough productive of yellow/green phlegm, sl sore throat, sl myalgia. GD with bronchitis on Zpak but pt says Zpak never effective for her. Cough keeping up at night. Still smoking but has cut back and plans to quit on 49th birthday in December.  Review of Systems:  Review of Systems  HENT: Negative for ear pain and trouble swallowing.   Respiratory: Negative for shortness of breath.   Cardiovascular: Negative for chest pain.    Patient Active Problem List   Diagnosis Date Noted  . 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 Acid indigestion 09/07/2014    Prior to Admission medications   Medication Sig Start Date End Date Taking? Authorizing Provider  benzonatate (TESSALON) 200 MG capsule Take 1 capsule (200 mg total) by mouth 2 (two) times daily as needed for cough. 10/16/15   Adline Potter, MD  doxycycline (VIBRAMYCIN) 100 MG capsule Take 1 capsule (100 mg total) by mouth 2 (two) times daily. 10/16/15   Adline Potter, MD    Allergies  Allergen Reactions  . 2,4-D Dimethylamine (Amisol) Itching    Past Surgical History  Procedure Laterality Date  . Rotator cuff repair    . Debridement tennis elbow    . Appendectomy    . Tonsillectomy    . Abdominal hysterectomy    . Back surgery      this will make third time  . Metatarsal osteotomy  05/18/2013    Social History  Substance Use Topics  . Smoking status: Current Every Day Smoker -- 1.00 packs/day for 25 years    Types: Cigarettes  . Smokeless tobacco: Not on file  . Alcohol Use: No    Family History  Problem Relation Age of Onset  . Cancer Father   . Stroke Father   . Arthritis/Rheumatoid Father   . Diabetes Mother   . Diabetes Father   . High blood pressure Mother   . High blood pressure Father   .  Gout Mother     Medication list has been reviewed and updated.  Physical Examination: BP 115/85 mmHg  Pulse 102  Temp(Src) 98.1 F (36.7 C) (Oral)  Resp 16  Ht 5\' 6"  (1.676 m)  Wt 132 lb 9.6 oz (60.147 kg)  BMI 21.41 kg/m2  SpO2 99%  Physical Exam  Constitutional: She appears well-developed and well-nourished.  HENT:  Nose: Nose normal.  Mouth/Throat: Oropharynx is clear and moist.  Mild B max sinus tenderness  Neck: Neck supple. No thyromegaly present.  Pulmonary/Chest: Effort normal.  Diffuse B end expiratory wheezing  Lymphadenopathy:    She has no cervical adenopathy.  Neurological: She is alert.  Skin: Skin is warm and dry.  Psychiatric: She has a normal mood and affect. Her behavior is normal.  Nursing note and vitals reviewed.   Assessment and Plan:  1. Bronchitis - doxycycline (VIBRAMYCIN) 100 MG capsule; Take 1 capsule (100 mg total) by mouth 2 (two) times daily.  Dispense: 14 capsule; Refill: 0 - benzonatate (TESSALON) 200 MG capsule; Take 1 capsule (200 mg total) by mouth 2 (two) times daily as needed for cough.  Dispense: 14 capsule; Refill: 0  2. Smoker Advised cessation   Return if symptoms worsen or fail to  improve.  Satira Anis. Sabri Teal, [facility]  10/16/2015

## 2015-12-07 ENCOUNTER — Other Ambulatory Visit: Payer: Self-pay | Admitting: Family Medicine

## 2015-12-08 ENCOUNTER — Telehealth: Payer: Self-pay

## 2015-12-08 NOTE — Telephone Encounter (Signed)
This is [facility] patient, please forward request to them.

## 2015-12-08 NOTE — Telephone Encounter (Signed)
Sent to [facility] 

## 2015-12-12 NOTE — Telephone Encounter (Signed)
Message left with grandmother who says she will have patient call office back.

## 2015-12-12 NOTE — Telephone Encounter (Signed)
Tried to contact patient with Dr. Luan Pulling recommendation. Patient's vmailbox full.

## 2015-12-12 NOTE — Telephone Encounter (Signed)
She can have prescription of Benzonatate, 100 mg., 1 up to 3 times a day for cough.  If shew thinks that she needs an antibiotic, she would need to be seen.  I don't have time on my schedule to see her today unless we have a cancellation.-jh

## 2016-01-17 DIAGNOSIS — C801 Malignant (primary) neoplasm, unspecified: Secondary | ICD-10-CM

## 2016-01-17 DIAGNOSIS — C4491 Basal cell carcinoma of skin, unspecified: Secondary | ICD-10-CM

## 2016-01-17 HISTORY — DX: Basal cell carcinoma of skin, unspecified: C44.91

## 2016-01-17 HISTORY — DX: Malignant (primary) neoplasm, unspecified: C80.1

## 2016-04-10 ENCOUNTER — Telehealth: Payer: Self-pay | Admitting: Family Medicine

## 2016-04-10 NOTE — Telephone Encounter (Signed)
Caroline Conley with [facility] said pt was saw Dr. Cathie Olden on May 20th for left knee and would be following up with him after the MRI.  Her call back number is [phone]

## 2016-04-11 NOTE — Telephone Encounter (Signed)
This was just FYI nothing needed.[place]

## 2016-04-25 ENCOUNTER — Ambulatory Visit: Payer: Medicaid Other | Attending: Orthopedic Surgery

## 2016-04-25 DIAGNOSIS — M25562 Pain in left knee: Secondary | ICD-10-CM | POA: Insufficient documentation

## 2016-04-25 NOTE — Therapy (Signed)
[place] MAIN [facility] SERVICES [address] [place], [place], [address] Phone: [phone]   Fax:  [phone]  Physical Therapy Evaluation  Patient Details  Name: Caroline Conley MRN: [id number] Date of Birth: Apr 10, 1966 No Data Recorded  Encounter Date: 04/25/2016      PT End of Session - 04/25/16 1459    Visit Number 1   Number of Visits 1   Date for PT Re-Evaluation 04/26/16   PT Start Time 1400   PT Stop Time 1450   PT Time Calculation (min) 50 min   Activity Tolerance Patient tolerated treatment well   Behavior During Therapy [facility] for tasks assessed/performed      Past Medical History  Diagnosis Date  . Cancer ([place]) 01/2016    basal cell     Past Surgical History  Procedure Laterality Date  . Rotator cuff repair    . Debridement tennis elbow    . Appendectomy    . Tonsillectomy    . Abdominal hysterectomy    . Back surgery      this will make third time  . Metatarsal osteotomy  05/18/2013    There were no vitals filed for this visit.       Subjective Assessment - 04/25/16 1408    Subjective (p) pt reports about 8 months ago she began having L knee pain with insidious onset. she reports she had 2 cortison shots which didnt help. she since had MRI 2 weeks ago which showed a proximal L calf tear. She has been in a L walking boot for 2 weeks and will be wearing it for another 4 weeks. pt reports no popping. she reports she may have some tingling in teh medial knee. pt reports her pain is mostly to the medial and posterior knee. she reports her pain worsens with activity. pt deines any pain extending past the knee. pt deneis any back pain.    Pertinent History (p) history of smoking, multiple orthopedic injuries    Patient Stated Goals (p) reduce pain   Currently in Pain? (p) Yes   Pain Score (p) 6    Pain Location (p) Knee   Pain Orientation (p) Left   Pain Descriptors / Indicators (p) Burning;Aching   Pain Type (p)  Chronic pain            [facility] PT Assessment - 04/25/16 0001    Balance Screen   Has the patient fallen in the past 6 months No   Has the patient had a decrease in activity level because of a fear of falling?  No   Is the patient reluctant to leave their home because of a fear of falling?  No         POSTURE/OBSERVATION: WFL  PROM/AROM: L knee AROM WNL. Pt reports pain with terminal knee flexion to the distal medial hamstring L ankle AROM :WNL all directions no pain SLR" + neural tension at 50deg  STRENGTH:  Graded on a 0-5 scale Muscle Group Left Right  Shoulder flex    Shoulder Abd    Shoulder Ext    Shoulder IR/ER    Elbow    Wrist/hand     Hip Flex 4+ 5  Hip Abd 5 5  Hip Add 5 5  Hip Ext 4 4  Hip IR/ER    Knee Flex 4+ 5  Knee Ext 5 5  Ankle DF 5 5  Ankle PF 5 5   SENSATION: WNL Dermatomes/ myotomes WNL  bilaterally  Reflexes"  2+ bilaterally patellar and achilles   SPECIAL TESTS: Repeated lumbar movements.  Flexion: no change Extension: pt reports " maybe a little less tightness" Anterior drawer L (-) Posterior drawer (-) Valgus/varus stress test (-)  Thomas (-)  Palpation: Pt non tender to L medial / lateral knee joint line Non tender to L foot/ ankle Non tender to L calf + tenderness to L pes anserine + tenderness to L semimembranosus     GAIT: deferred due to boot restriction. pt has no difficulty walking in boot   ThereX:   open chain towel stretch 30s x 3 Long sitting hamstring stretch 30s x 3 Sciatic nerve glides x 30 (L)                PT Education - 04/25/16 1459    Education provided Yes   Education Details exam findings   Person(s) Educated Patient   Methods Explanation   Comprehension Verbalized understanding             PT Long Term Goals - 04/25/16 1506    PT LONG TERM GOAL #1   Title pt will demonstrate understanding of LLE stretching to improve flexibility   Time 1   Period Weeks   Status  New               Plan - 04/25/16 1500    Clinical Impression Statement pt presents with chronic L medial knee pain with insidious onset beginning over 8 months ago. she has had injections and reports was found to have a "calf tear behind the knee". pt has been wearing a walking boot for 2 weeks and was instructed to wear it for another 4 weeks. upon exam pt has full L ankle ROM, Full L ankle strength without pain in any direction of resistance. pt pain seemed to be reproduced with resisted hamstring MMT as well as tenderness to palpation over the pes anserines. pt has almost no tenderness to the calf. she did have tenderness to the semimembrinosis. pt also demosntrates + slump and SLR on the L. pt s/s and exam findings are not consistant with a calf strain. pt was given open chain calf stretching to maintain achilles flexibility, hamstring stretching and sciatic nerve glides. pt would benefit from continued skilled PT services to address her pain, neural tension, strength and flexibility. pt current insurance does not cover PT follow up visits and she declines to pay OOP. pt will be referred to [facility] for follow up. pt encouraged to do gentle L ankle AROM when seated, but continue to adhere to walking boot as directed by her referring physician.    Consulted and Agree with Plan of Care Patient      Patient will benefit from skilled therapeutic intervention in order to improve the following deficits and impairments:  Abnormal gait, Decreased strength, Pain, Impaired flexibility, Decreased activity tolerance  Visit Diagnosis: Pain in left knee - Plan: PT plan of care cert/re-cert     Problem List Patient Active Problem List   Diagnosis Date Noted  . 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 Acid indigestion 09/07/2014   Caroline Conley. Talia Hoheisel, PT, DPT [phone]  [name] 04/25/2016, 3:08 PM  [place] MAIN [facility]  SERVICES [address] [place], [place], [address] Phone: [phone]   Fax:  [phone]  Name: Caroline Conley MRN: [id number] Date of Birth: Aug 09, 1966

## 2016-04-25 NOTE — Patient Instructions (Signed)
[email or web address] Towel calf stretch 30s x 3 Long sitting hamstrings stretch 30s x 3 Sciatic nerve glide L 30x

## 2016-06-07 ENCOUNTER — Encounter: Payer: Self-pay | Admitting: Family Medicine

## 2016-06-07 ENCOUNTER — Encounter (INDEPENDENT_AMBULATORY_CARE_PROVIDER_SITE_OTHER): Payer: Self-pay

## 2016-06-07 ENCOUNTER — Ambulatory Visit (INDEPENDENT_AMBULATORY_CARE_PROVIDER_SITE_OTHER): Payer: Medicaid Other | Admitting: Family Medicine

## 2016-06-07 ENCOUNTER — Other Ambulatory Visit: Payer: Self-pay | Admitting: Family Medicine

## 2016-06-07 VITALS — BP 121/70 | HR 68 | Temp 98.4°F | Resp 16 | Ht 66.0 in | Wt 130.0 lb

## 2016-06-07 DIAGNOSIS — J410 Simple chronic bronchitis: Secondary | ICD-10-CM

## 2016-06-07 DIAGNOSIS — Z720 Tobacco use: Secondary | ICD-10-CM | POA: Diagnosis not present

## 2016-06-07 DIAGNOSIS — C4359 Malignant melanoma of other part of trunk: Secondary | ICD-10-CM

## 2016-06-07 DIAGNOSIS — Z1239 Encounter for other screening for malignant neoplasm of breast: Secondary | ICD-10-CM | POA: Diagnosis not present

## 2016-06-07 DIAGNOSIS — Z1211 Encounter for screening for malignant neoplasm of colon: Secondary | ICD-10-CM

## 2016-06-07 DIAGNOSIS — K130 Diseases of lips: Secondary | ICD-10-CM | POA: Diagnosis not present

## 2016-06-07 DIAGNOSIS — E785 Hyperlipidemia, unspecified: Secondary | ICD-10-CM | POA: Diagnosis not present

## 2016-06-07 MED ORDER — ALBUTEROL SULFATE HFA 108 (90 BASE) MCG/ACT IN AERS: 2.0000 | INHALATION_SPRAY | Freq: Four times a day (QID) | RESPIRATORY_TRACT | Status: DC | PRN

## 2016-06-07 MED ORDER — FLUTICASONE-SALMETEROL 250-50 MCG/DOSE IN AEPB: 1.0000 | INHALATION_SPRAY | Freq: Two times a day (BID) | RESPIRATORY_TRACT | Status: DC

## 2016-06-07 MED ORDER — FLUCONAZOLE 150 MG PO TABS: 150.0000 mg | ORAL_TABLET | Freq: Once | ORAL | Status: DC

## 2016-06-07 NOTE — Assessment & Plan Note (Signed)
Renewed PRN diflucan for occasional outbreaks. Encouraged pt to rinse mouth.

## 2016-06-07 NOTE — Patient Instructions (Signed)
We will need to do a well woman physical sometime in the near future. Please make appt for fasting labwork about 1 week prior. We will go over your labs at next visit.   You can call [facility] to schedule your mammogram anytime.   Let's try advair for your breathing. Take 1 puff twice daily. Continue albuterol as needed.  Please seek immediate medical attention if you develop shortness of breath not relieve by inhaler, chest pain/tightness, fever > 103 F or other concerning symptoms.

## 2016-06-07 NOTE — Assessment & Plan Note (Signed)
Encouraged smoking cessation 

## 2016-06-07 NOTE — Progress Notes (Signed)
Subjective:    Patient ID: Caroline Conley, female    DOB: 1966/05/25, 50 y.o.   MRN: [id number]  HPI: Caroline Conley is a 50 y.o. female presenting on 06/07/2016 for Establish Care   HPI  Pt presents to re-establish care. Previously a patient here but most care was done through [facility]. Has history of  COPD/ Asthma: Taking asthma and Qvar to help with her symptoms. Unsure if spirometry has been done. Take albuterol daily- 4 times per day. 3x per week night-time awakening. Breathing issues started 3 years ago. Smokes 1 pack per day. Has started chantix dose pack to quit smoking. Coughs up sputum daily- clear/white. SOB on exertion. SPirometry done at [facility] Angular chelitis: From Qvar inhaler was not rising mouth- takes diflucan PRN for outbreaks. Cream has not been helpful in the past. Occurs every other month or so.   Melanoma: Treated at [facility]- L buttock. Removed at [facility]. Has lesions on face. Sees Dr. Evorn Gong at [facility].    Health Maintenance: Colonoscopy: Never had. Mammogram: Has not had.  Hysterectomy: Age 38- bleeding and uterine cancer. Has not had pap smear. Removed by Dr. Nicolasa Ducking.   Past Medical History  Diagnosis Date  . Cancer ([place]) 01/2016    basal cell     Current Outpatient Prescriptions on File Prior to Visit  Medication Sig  . oxyCODONE-acetaminophen (PERCOCET) 10-325 MG tablet Take 1 tablet by mouth every 4 (four) hours as needed for pain.   No current facility-administered medications on file prior to visit.    Review of Systems  Constitutional: Negative for fever and chills.  HENT: Negative.   Respiratory: Positive for shortness of breath (with exertion.) and wheezing. Negative for cough and chest tightness.   Cardiovascular: Negative for chest pain and leg swelling.  Gastrointestinal: Negative for nausea, vomiting, abdominal pain, diarrhea and constipation.  Endocrine: Negative.  Negative for cold intolerance, heat intolerance,  polydipsia, polyphagia and polyuria.  Genitourinary: Negative for dysuria and difficulty urinating.  Musculoskeletal: Negative.   Neurological: Negative for dizziness, light-headedness and numbness.  Psychiatric/Behavioral: Negative.    Per HPI unless specifically indicated above     Objective:    BP 121/70 mmHg  Pulse 68  Temp(Src) 98.4 F (36.9 C) (Oral)  Resp 16  Ht '5\' 6"'  (1.676 m)  Wt 130 lb (58.968 kg)  BMI 20.99 kg/m2  Wt Readings from Last 3 Encounters:  06/07/16 130 lb (58.968 kg)  10/16/15 132 lb 9.6 oz (60.147 kg)  10/17/11 210 lb 5.1 oz (95.4 kg)    Physical Exam  Constitutional: She is oriented to person, place, and time. She appears well-developed and well-nourished.  HENT:  Head: Normocephalic and atraumatic.  Neck: Neck supple.  Cardiovascular: Normal rate, regular rhythm and normal heart sounds.  Exam reveals no gallop and no friction rub.   No murmur heard. Pulmonary/Chest: Effort normal. She has wheezes (expiratory. clear with coughing.). She exhibits no tenderness.  Abdominal: Soft. Normal appearance and bowel sounds are normal. She exhibits no distension and no mass. There is no tenderness. There is no rebound and no guarding.  Musculoskeletal: Normal range of motion. She exhibits no edema or tenderness.  Lymphadenopathy:    She has no cervical adenopathy.  Neurological: She is alert and oriented to person, place, and time.  Skin: Skin is warm and dry.   Results for orders placed or performed in visit on 09/23/14  Troponin I  Result Value Ref Range   Troponin-I <  0.02 ng/mL  CBC  Result Value Ref Range   WBC 10.2 3.6-11.0 x10 3/mm 3   RBC 3.99 3.80-5.20 X10 6/mm 3   HGB 12.6 12.0-16.0 g/dL   HCT 37.6 35.0-47.0 %   MCV 94 80-100 fL   MCH 31.6 26.0-34.0 pg   MCHC 33.5 32.0-36.0 g/dL   RDW 12.8 11.5-14.5 %   Platelet 260 150-440 x10 3/mm 3  Basic metabolic panel  Result Value Ref Range   Glucose 92 65-99 mg/dL   BUN 5 (L) 7-18 mg/dL    Creatinine 1.18 0.60-1.30 mg/dL   Sodium 140 136-145 mmol/L   Potassium 3.6 3.5-5.1 mmol/L   Chloride 107 98-107 mmol/L   Co2 26 21-32 mmol/L   Calcium, Total 8.5 8.5-10.1 mg/dL   Osmolality 276 275-301   Anion Gap 7 7-16   EGFR (African American) >60 >32m/min   EGFR (Non-African Amer.) 52 (L) >665mmin  Lipase, blood  Result Value Ref Range   Lipase 110 73-393 Unit/L  Troponin I  Result Value Ref Range   Troponin-I < 0.02 ng/mL      Assessment & Plan:   Problem List Items Addressed This Visit      Respiratory   COPD (chronic obstructive pulmonary disease) ([place]- [place]    Simple bronchitis- obtain PFT's from urgent care. Change to Advair due to night-time awakenings and frequent inhaler use. Reviewed s/s of exacerbation. Encouraged smoking cessation.  Repeat spirometry within 1 year.  Recheck 2 mos.       Relevant Medications   beclomethasone (QVAR) 80 MCG/ACT inhaler   varenicline (CHANTIX) 1 MG tablet   Fluticasone-Salmeterol (ADVAIR) 250-50 MCG/DOSE AEPB   albuterol (PROAIR HFA) 108 (90 Base) MCG/ACT inhaler   Other Relevant Orders   COMPLETE METABOLIC PANEL WITH GFR   CBC with Differential/Platelet     Digestive   Angular cheilitis    Renewed PRN diflucan for occasional outbreaks. Encouraged pt to rinse mouth.       Relevant Medications   fluconazole (DIFLUCAN) 150 MG tablet     Other   Malignant melanoma of buttock (HCC)    Continue to follow with dermatology.       Tobacco use    Encouraged smoking cessation.        Other Visit Diagnoses    Screening for breast cancer        Screening for colon cancer        Relevant Orders    Ambulatory referral to General Surgery    Mild hyperlipidemia        Relevant Orders    Lipid panel       Meds ordered this encounter  Medications  . beclomethasone (QVAR) 80 MCG/ACT inhaler    Sig: Inhale 3 puffs into the lungs daily.  . Marland KitchenISCONTD: albuterol (PROAIR HFA) 108 (90 Base) MCG/ACT inhaler    Sig:  Inhale 2 puffs into the lungs every 6 (six) hours as needed for wheezing or shortness of breath.  . varenicline (CHANTIX) 1 MG tablet    Sig: Take 1 mg by mouth daily.  . Fluticasone-Salmeterol (ADVAIR) 250-50 MCG/DOSE AEPB    Sig: Inhale 1 puff into the lungs 2 (two) times daily.    Dispense:  14 each    Refill:  11    Order Specific Question:  Supervising Provider    Answer:  HAArlis [phone]. albuterol (PROAIR HFA) 108 (90 Base) MCG/ACT inhaler    Sig: Inhale 2 puffs into the lungs every  6 (six) hours as needed for wheezing or shortness of breath.    Dispense:  1 Inhaler    Refill:  11    Order Specific Question:  Supervising Provider    Answer:  Arlis Porta [phone]  . fluconazole (DIFLUCAN) 150 MG tablet    Sig: Take 1 tablet (150 mg total) by mouth once.    Dispense:  5 tablet    Refill:  3    Order Specific Question:  Supervising Provider    Answer:  Arlis Porta [[id number]]      Follow up plan: Return in about 2 months (around 08/07/2016) for [facility]- within the next few month. Marland Kitchen

## 2016-06-07 NOTE — Assessment & Plan Note (Signed)
Continue to follow with dermatology.

## 2016-06-07 NOTE — Assessment & Plan Note (Signed)
Simple bronchitis- obtain PFT's from urgent care. Change to Advair due to night-time awakenings and frequent inhaler use. Reviewed s/s of exacerbation. Encouraged smoking cessation.  Repeat spirometry within 1 year.  Recheck 2 mos.

## 2016-06-24 ENCOUNTER — Other Ambulatory Visit: Payer: BLUE CROSS/BLUE SHIELD

## 2016-06-24 ENCOUNTER — Other Ambulatory Visit: Payer: Self-pay | Admitting: Family Medicine

## 2016-06-25 LAB — CBC WITH DIFFERENTIAL/PLATELET
BASOS ABS: 0 {cells}/uL (ref 0–200)
Basophils Relative: 0 %
EOS ABS: 186 {cells}/uL (ref 15–500)
Eosinophils Relative: 2 %
HEMATOCRIT: 37.4 % (ref 35.0–45.0)
Hemoglobin: 12.9 g/dL (ref 11.7–15.5)
LYMPHS PCT: 26 %
Lymphs Abs: 2418 cells/uL (ref 850–3900)
MCH: 32 pg (ref 27.0–33.0)
MCHC: 34.5 g/dL (ref 32.0–36.0)
MCV: 92.8 fL (ref 80.0–100.0)
MONO ABS: 744 {cells}/uL (ref 200–950)
MONOS PCT: 8 %
MPV: 9.5 fL (ref 7.5–12.5)
Neutro Abs: 5952 cells/uL (ref 1500–7800)
Neutrophils Relative %: 64 %
PLATELETS: 245 10*3/uL (ref 140–400)
RBC: 4.03 MIL/uL (ref 3.80–5.10)
RDW: 13.1 % (ref 11.0–15.0)
WBC: 9.3 10*3/uL (ref 3.8–10.8)

## 2016-06-25 LAB — LIPID PANEL
CHOL/HDL RATIO: 4.8 ratio (ref <=5.0)
Cholesterol: 163 mg/dL (ref 125–200)
HDL: 34 mg/dL — ABNORMAL LOW (ref >=46)
LDL CALC: 73 mg/dL (ref <130)
Triglycerides: 279 mg/dL — ABNORMAL HIGH (ref <150)
VLDL: 56 mg/dL — ABNORMAL HIGH (ref <30)

## 2016-06-25 LAB — COMPLETE METABOLIC PANEL WITH GFR
ALT: 9 U/L (ref 6–29)
AST: 12 U/L (ref 10–35)
Albumin: 3.8 g/dL (ref 3.6–5.1)
Alkaline Phosphatase: 84 U/L (ref 33–130)
BILIRUBIN TOTAL: 0.4 mg/dL (ref 0.2–1.2)
BUN: 8 mg/dL (ref 7–25)
CHLORIDE: 104 mmol/L (ref 98–110)
CO2: 27 mmol/L (ref 20–31)
CREATININE: 1.13 mg/dL — AB (ref 0.50–1.05)
Calcium: 9.1 mg/dL (ref 8.6–10.4)
GFR, Est African American: 65 mL/min (ref >=60)
GFR, Est Non African American: 57 mL/min — ABNORMAL LOW (ref >=60)
GLUCOSE: 99 mg/dL (ref 65–99)
Potassium: 3.9 mmol/L (ref 3.5–5.3)
SODIUM: 139 mmol/L (ref 135–146)
TOTAL PROTEIN: 6.2 g/dL (ref 6.1–8.1)

## 2016-06-26 ENCOUNTER — Ambulatory Visit (INDEPENDENT_AMBULATORY_CARE_PROVIDER_SITE_OTHER): Payer: Medicaid Other | Admitting: Family Medicine

## 2016-06-26 ENCOUNTER — Other Ambulatory Visit: Payer: Self-pay | Admitting: Family Medicine

## 2016-06-26 VITALS — BP 120/72 | HR 86 | Temp 97.8°F | Resp 16 | Ht 66.0 in | Wt 131.4 lb

## 2016-06-26 DIAGNOSIS — J410 Simple chronic bronchitis: Secondary | ICD-10-CM | POA: Diagnosis not present

## 2016-06-26 DIAGNOSIS — Z0000 Encounter for general adult medical examination without abnormal findings: Secondary | ICD-10-CM

## 2016-06-26 DIAGNOSIS — Z01419 Encounter for gynecological examination (general) (routine) without abnormal findings: Secondary | ICD-10-CM

## 2016-06-26 DIAGNOSIS — Z1239 Encounter for other screening for malignant neoplasm of breast: Secondary | ICD-10-CM | POA: Diagnosis not present

## 2016-06-26 DIAGNOSIS — Z124 Encounter for screening for malignant neoplasm of cervix: Secondary | ICD-10-CM | POA: Diagnosis not present

## 2016-06-26 DIAGNOSIS — Z720 Tobacco use: Secondary | ICD-10-CM | POA: Diagnosis not present

## 2016-06-26 NOTE — Assessment & Plan Note (Signed)
Doing well on advair. Plan for spirometry in September.

## 2016-06-26 NOTE — Patient Instructions (Addendum)
Please call [facility] to schedule Mammogram [phone]    Health Maintenance, Female Adopting a healthy lifestyle and getting preventive care can go a long way to promote health and wellness. Talk with your health care provider about what schedule of regular examinations is right for you. This is a good chance for you to check in with your provider about disease prevention and staying healthy. In between checkups, there are plenty of things you can do on your own. Experts have done a lot of research about which lifestyle changes and preventive measures are most likely to keep you healthy. Ask your health care provider for more information. WEIGHT AND DIET  Eat a healthy diet  Be sure to include plenty of vegetables, fruits, low-fat dairy products, and lean protein.  Do not eat a lot of foods high in solid fats, added sugars, or salt.  Get regular exercise. This is one of the most important things you can do for your health.  Most adults should exercise for at least 150 minutes each week. The exercise should increase your heart rate and make you sweat (moderate-intensity exercise).  Most adults should also do strengthening exercises at least twice a week. This is in addition to the moderate-intensity exercise.  Maintain a healthy weight  Body mass index (BMI) is a measurement that can be used to identify possible weight problems. It estimates body fat based on height and weight. Your health care provider can help determine your BMI and help you achieve or maintain a healthy weight.  For females 4 years of age and older:   A BMI below 18.5 is considered underweight.  A BMI of 18.5 to 24.9 is normal.  A BMI of 25 to 29.9 is considered overweight.  A BMI of 30 and above is considered obese.  Watch levels of cholesterol and blood lipids  You should start having your blood tested for lipids and cholesterol at 51 years of age, then have this test every 5 years.  You may  need to have your cholesterol levels checked more often if:  Your lipid or cholesterol levels are high.  You are older than 51 years of age.  You are at high risk for heart disease.  CANCER SCREENING   Lung Cancer  Lung cancer screening is recommended for adults 79-55 years old who are at high risk for lung cancer because of a history of smoking.  A yearly low-dose CT scan of the lungs is recommended for people who:  Currently smoke.  Have quit within the past 15 years.  Have at least a 30-pack-year history of smoking. A pack year is smoking an average of one pack of cigarettes a day for 1 year.  Yearly screening should continue until it has been 15 years since you quit.  Yearly screening should stop if you develop a health problem that would prevent you from having lung cancer treatment.  Breast Cancer  Practice breast self-awareness. This means understanding how your breasts normally appear and feel.  It also means doing regular breast self-exams. Let your health care provider know about any changes, no matter how small.  If you are in your 20s or 30s, you should have a clinical breast exam (CBE) by a health care provider every 1-3 years as part of a regular health exam.  If you are 62 or older, have a CBE every year. Also consider having a breast X-ray (mammogram) every year.  If you have a family history of breast cancer,  talk to your health care provider about genetic screening.  If you are at high risk for breast cancer, talk to your health care provider about having an MRI and a mammogram every year.  Breast cancer gene (BRCA) assessment is recommended for women who have family members with BRCA-related cancers. BRCA-related cancers include:  Breast.  Ovarian.  Tubal.  Peritoneal cancers.  Results of the assessment will determine the need for genetic counseling and BRCA1 and BRCA2 testing. Cervical Cancer Your health care provider may recommend that you be  screened regularly for cancer of the pelvic organs (ovaries, uterus, and vagina). This screening involves a pelvic examination, including checking for microscopic changes to the surface of your cervix (Pap test). You may be encouraged to have this screening done every 3 years, beginning at age 61.  For women ages 30-65, health care providers may recommend pelvic exams and Pap testing every 3 years, or they may recommend the Pap and pelvic exam, combined with testing for human papilloma virus (HPV), every 5 years. Some types of HPV increase your risk of cervical cancer. Testing for HPV may also be done on women of any age with unclear Pap test results.  Other health care providers may not recommend any screening for nonpregnant women who are considered low risk for pelvic cancer and who do not have symptoms. Ask your health care provider if a screening pelvic exam is right for you.  If you have had past treatment for cervical cancer or a condition that could lead to cancer, you need Pap tests and screening for cancer for at least 20 years after your treatment. If Pap tests have been discontinued, your risk factors (such as having a new sexual partner) need to be reassessed to determine if screening should resume. Some women have medical problems that increase the chance of getting cervical cancer. In these cases, your health care provider may recommend more frequent screening and Pap tests. Colorectal Cancer  This type of cancer can be detected and often prevented.  Routine colorectal cancer screening usually begins at 51 years of age and continues through 51 years of age.  Your health care provider may recommend screening at an earlier age if you have risk factors for colon cancer.  Your health care provider may also recommend using home test kits to check for hidden blood in the stool.  A small camera at the end of a tube can be used to examine your colon directly (sigmoidoscopy or colonoscopy).  This is done to check for the earliest forms of colorectal cancer.  Routine screening usually begins at age 29.  Direct examination of the colon should be repeated every 5-10 years through 51 years of age. However, you may need to be screened more often if early forms of precancerous polyps or small growths are found. Skin Cancer  Check your skin from head to toe regularly.  Tell your health care provider about any new moles or changes in moles, especially if there is a change in a mole's shape or color.  Also tell your health care provider if you have a mole that is larger than the size of a pencil eraser.  Always use sunscreen. Apply sunscreen liberally and repeatedly throughout the day.  Protect yourself by wearing long sleeves, pants, a wide-brimmed hat, and sunglasses whenever you are outside. HEART DISEASE, DIABETES, AND HIGH BLOOD PRESSURE   High blood pressure causes heart disease and increases the risk of stroke. High blood pressure is more likely to  develop in:  People who have blood pressure in the high end of the normal range (130-139/85-89 mm Hg).  People who are overweight or obese.  People who are African American.  If you are 13-57 years of age, have your blood pressure checked every 3-5 years. If you are 29 years of age or older, have your blood pressure checked every year. You should have your blood pressure measured twice--once when you are at a hospital or clinic, and once when you are not at a hospital or clinic. Record the average of the two measurements. To check your blood pressure when you are not at a hospital or clinic, you can use:  An automated blood pressure machine at a pharmacy.  A home blood pressure monitor.  If you are between 48 years and 65 years old, ask your health care provider if you should take aspirin to prevent strokes.  Have regular diabetes screenings. This involves taking a blood sample to check your fasting blood sugar level.  If you  are at a normal weight and have a low risk for diabetes, have this test once every three years after 51 years of age.  If you are overweight and have a high risk for diabetes, consider being tested at a younger age or more often. PREVENTING INFECTION  Hepatitis B  If you have a higher risk for hepatitis B, you should be screened for this virus. You are considered at high risk for hepatitis B if:  You were born in a country where hepatitis B is common. Ask your health care provider which countries are considered high risk.  Your parents were born in a high-risk country, and you have not been immunized against hepatitis B (hepatitis B vaccine).  You have HIV or AIDS.  You use needles to inject street drugs.  You live with someone who has hepatitis B.  You have had sex with someone who has hepatitis B.  You get hemodialysis treatment.  You take certain medicines for conditions, including cancer, organ transplantation, and autoimmune conditions. Hepatitis C  Blood testing is recommended for:  Everyone born from [birth year] through 1966.  Anyone with known risk factors for hepatitis C. Sexually transmitted infections (STIs)  You should be screened for sexually transmitted infections (STIs) including gonorrhea and chlamydia if:  You are sexually active and are younger than 51 years of age.  You are older than 51 years of age and your health care provider tells you that you are at risk for this type of infection.  Your sexual activity has changed since you were last screened and you are at an increased risk for chlamydia or gonorrhea. Ask your health care provider if you are at risk.  If you do not have HIV, but are at risk, it may be recommended that you take a prescription medicine daily to prevent HIV infection. This is called pre-exposure prophylaxis (PrEP). You are considered at risk if:  You are sexually active and do not regularly use condoms or know the HIV status of your  partner(s).  You take drugs by injection.  You are sexually active with a partner who has HIV. Talk with your health care provider about whether you are at high risk of being infected with HIV. If you choose to begin PrEP, you should first be tested for HIV. You should then be tested every 3 months for as long as you are taking PrEP.  PREGNANCY   If you are premenopausal and you may become pregnant,  ask your health care provider about preconception counseling.  If you may become pregnant, take 400 to 800 micrograms (mcg) of folic acid every day.  If you want to prevent pregnancy, talk to your health care provider about birth control (contraception). OSTEOPOROSIS AND MENOPAUSE   Osteoporosis is a disease in which the bones lose minerals and strength with aging. This can result in serious bone fractures. Your risk for osteoporosis can be identified using a bone density scan.  If you are 51 years of age or older, or if you are at risk for osteoporosis and fractures, ask your health care provider if you should be screened.  Ask your health care provider whether you should take a calcium or vitamin D supplement to lower your risk for osteoporosis.  Menopause may have certain physical symptoms and risks.  Hormone replacement therapy may reduce some of these symptoms and risks. Talk to your health care provider about whether hormone replacement therapy is right for you.  HOME CARE INSTRUCTIONS   Schedule regular health, dental, and eye exams.  Stay current with your immunizations.   Do not use any tobacco products including cigarettes, chewing tobacco, or electronic cigarettes.  If you are pregnant, do not drink alcohol.  If you are breastfeeding, limit how much and how often you drink alcohol.  Limit alcohol intake to no more than 1 drink per day for nonpregnant women. One drink equals 12 ounces of beer, 5 ounces of wine, or 1 ounces of hard liquor.  Do not use street drugs.  Do  not share needles.  Ask your health care provider for help if you need support or information about quitting drugs.  Tell your health care provider if you often feel depressed.  Tell your health care provider if you have ever been abused or do not feel safe at home.   This information is not intended to replace advice given to you by your health care provider. Make sure you discuss any questions you have with your health care provider.   Document Released: 06/17/2011 Document Revised: 12/23/2014 Document Reviewed: 11/03/2013 Elsevier Interactive Patient Education [employer].

## 2016-06-26 NOTE — Assessment & Plan Note (Signed)
Doing well on Chantix. Goal to quit smoking by September.

## 2016-06-26 NOTE — Progress Notes (Signed)
Subjective:    Patient ID: Caroline Conley, female    DOB: Apr 03, 1966, 50 y.o.   MRN: [id number]  HPI: Caroline Conley is a 50 y.o. female presenting on 06/26/2016 for Annual Exam   HPI  Pt presents for annual exam. Labs showed mildly elevated cholesterol- not in ASCVD benefit group.  Started advair for COPD- is feeling much better with her breathing. Doing well with chantix for smoking cessation. Walking 3 miles per day. Previous hysterectomy for cervical cancer. Never had a repeat pap of cuff. Will do today. Mammo ordered. Colonoscopy ordered.  Wears sunscreen daily for history of melanoma.   Past Medical History  Diagnosis Date  . Cancer ([place]) 01/2016    basal cell    Social History   Social History  . Marital Status: Married    Spouse Name: N/A  . Number of Children: N/A  . Years of Education: N/A   Occupational History  . Not on file.   Social History Main Topics  . Smoking status: Current Every Day Smoker -- 1.00 packs/day for 25 years    Types: Cigarettes  . Smokeless tobacco: Not on file  . Alcohol Use: No  . Drug Use: No  . Sexual Activity: Yes   Other Topics Concern  . Not on file   Social History Narrative   Family History  Problem Relation Age of Onset  . Cancer Father   . Stroke Father   . Arthritis/Rheumatoid Father   . Diabetes Mother   . Diabetes Father   . High blood pressure Mother   . High blood pressure Father   . Gout Mother    Current Outpatient Prescriptions on File Prior to Visit  Medication Sig  . albuterol (PROAIR HFA) 108 (90 Base) MCG/ACT inhaler Inhale 2 puffs into the lungs every 6 (six) hours as needed for wheezing or shortness of breath.  . fluconazole (DIFLUCAN) 150 MG tablet Take 1 tablet (150 mg total) by mouth once.  . Fluticasone-Salmeterol (ADVAIR) 250-50 MCG/DOSE AEPB Inhale 1 puff into the lungs 2 (two) times daily.  Marland Kitchen oxyCODONE-acetaminophen (PERCOCET) 10-325 MG tablet Take 1 tablet by mouth every 4 (four) hours as  needed for pain.  . varenicline (CHANTIX) 1 MG tablet Take 1 mg by mouth daily.   No current facility-administered medications on file prior to visit.    Review of Systems  Constitutional: Negative for fever and chills.  HENT: Negative.   Respiratory: Negative for cough, chest tightness and wheezing.   Cardiovascular: Negative for chest pain and leg swelling.  Gastrointestinal: Negative for nausea, vomiting, abdominal pain, diarrhea and constipation.  Endocrine: Negative.  Negative for cold intolerance, heat intolerance, polydipsia, polyphagia and polyuria.  Genitourinary: Negative for dysuria, vaginal bleeding, vaginal discharge, difficulty urinating, vaginal pain and pelvic pain.  Musculoskeletal: Negative.   Neurological: Negative for dizziness, light-headedness and numbness.  Psychiatric/Behavioral: Negative.    Per HPI unless specifically indicated above     Objective:    BP 120/72 mmHg  Pulse 86  Temp(Src) 97.8 F (36.6 C) (Oral)  Resp 16  Ht 5' 6" (1.676 m)  Wt 131 lb 6.4 oz (59.603 kg)  BMI 21.22 kg/m2  Wt Readings from Last 3 Encounters:  06/26/16 131 lb 6.4 oz (59.603 kg)  06/07/16 130 lb (58.968 kg)  10/16/15 132 lb 9.6 oz (60.147 kg)    Physical Exam  Constitutional: She is oriented to person, place, and time. She appears well-developed and well-nourished.  HENT:  Head: Normocephalic and  atraumatic.  Neck: Neck supple.  Cardiovascular: Normal rate, regular rhythm and normal heart sounds.  Exam reveals no gallop and no friction rub.   No murmur heard. Pulmonary/Chest: Effort normal and breath sounds normal. She has no wheezes. She exhibits no tenderness.  Abdominal: Soft. Normal appearance and bowel sounds are normal. She exhibits no distension and no mass. There is no tenderness. There is no rebound and no guarding.  Genitourinary: No breast swelling, tenderness or discharge. There is no tenderness, lesion or injury on the right labia. There is no tenderness  or injury on the left labia. Right adnexum displays no tenderness and no fullness. Left adnexum displays no tenderness and no fullness. No tenderness in the vagina. No signs of injury around the vagina. No vaginal discharge found.  Uterus and cervix surgically absent.   Musculoskeletal: Normal range of motion. She exhibits no edema or tenderness.  Lymphadenopathy:    She has no cervical adenopathy.  Neurological: She is alert and oriented to person, place, and time.  Skin: Skin is warm and dry.   Results for orders placed or performed in visit on 06/24/16  COMPLETE METABOLIC PANEL WITH GFR  Result Value Ref Range   Sodium 139 135 - 146 mmol/L   Potassium 3.9 3.5 - 5.3 mmol/L   Chloride 104 98 - 110 mmol/L   CO2 27 20 - 31 mmol/L   Glucose, Bld 99 65 - 99 mg/dL   BUN 8 7 - 25 mg/dL   Creat 1.13 (H) 0.50 - 1.05 mg/dL   Total Bilirubin 0.4 0.2 - 1.2 mg/dL   Alkaline Phosphatase 84 33 - 130 U/L   AST 12 10 - 35 U/L   ALT 9 6 - 29 U/L   Total Protein 6.2 6.1 - 8.1 g/dL   Albumin 3.8 3.6 - 5.1 g/dL   Calcium 9.1 8.6 - 10.4 mg/dL   GFR, Est African American 65 >=60 mL/min   GFR, Est Non African American 57 (L) >=60 mL/min  CBC with Differential/Platelet  Result Value Ref Range   WBC 9.3 3.8 - 10.8 K/uL   RBC 4.03 3.80 - 5.10 MIL/uL   Hemoglobin 12.9 11.7 - 15.5 g/dL   HCT 37.4 35.0 - 45.0 %   MCV 92.8 80.0 - 100.0 fL   MCH 32.0 27.0 - 33.0 pg   MCHC 34.5 32.0 - 36.0 g/dL   RDW 13.1 11.0 - 15.0 %   Platelets 245 140 - 400 K/uL   MPV 9.5 7.5 - 12.5 fL   Neutro Abs 5952 1500 - 7800 cells/uL   Lymphs Abs 2418 850 - 3900 cells/uL   Monocytes Absolute 744 200 - 950 cells/uL   Eosinophils Absolute 186 15 - 500 cells/uL   Basophils Absolute 0 0 - 200 cells/uL   Neutrophils Relative % 64 %   Lymphocytes Relative 26 %   Monocytes Relative 8 %   Eosinophils Relative 2 %   Basophils Relative 0 %   Smear Review Criteria for review not met   Lipid panel  Result Value Ref Range    Cholesterol 163 125 - 200 mg/dL   Triglycerides 279 (H) <150 mg/dL   HDL 34 (L) >=46 mg/dL   Total CHOL/HDL Ratio 4.8 <=5.0 Ratio   VLDL 56 (H) <30 mg/dL   LDL Cholesterol 73 <130 mg/dL      Assessment & Plan:   Problem List Items Addressed This Visit      Respiratory   COPD (chronic obstructive pulmonary disease) ([place])  Doing well on advair. Plan for spirometry in September.         Other   Tobacco use    Doing well on Chantix. Goal to quit smoking by September.        Other Visit Diagnoses    Well woman exam with routine gynecological exam    -  Primary    Screening for breast cancer        Relevant Orders    MM DIGITAL SCREENING BILATERAL    Screening for cervical cancer        Relevant Orders    Pap,SurePath with HPV       No orders of the defined types were placed in this encounter.      Follow up plan: Return in about 2 months (around 08/27/2016) for COPD with spirometry. Marland Kitchen

## 2016-06-28 ENCOUNTER — Other Ambulatory Visit: Payer: Self-pay

## 2016-06-28 ENCOUNTER — Telehealth: Payer: Self-pay

## 2016-06-28 LAB — PAP,SUREPATH WITH HPV: HPV DNA High Risk: NOT DETECTED

## 2016-06-28 MED ORDER — NA SULFATE-K SULFATE-MG SULF 17.5-3.13-1.6 GM/177ML PO SOLN
1.0000 | Freq: Once | ORAL | Status: DC
Start: 2016-06-28 — End: 2016-11-18

## 2016-06-28 NOTE — Telephone Encounter (Signed)
Gastroenterology Pre-Procedure Review  Request Date: 07/08/2016 Requesting Physician: Dr. Vincenza Hews  PATIENT REVIEW QUESTIONS: The patient responded to the following health history questions as indicated:    1. Are you having any GI issues? no 2. Do you have a personal history of Polyps? no 3. Do you have a family history of Colon Cancer or Polyps? no 4. Diabetes Mellitus? no 5. Joint replacements in the past 12 months?no 6. Major health problems in the past 3 months?no 7. Any artificial heart valves, MVP, or defibrillator?no    MEDICATIONS & ALLERGIES:    Patient reports the following regarding taking any anticoagulation/antiplatelet therapy:   Plavix, Coumadin, Eliquis, Xarelto, Lovenox, Pradaxa, Brilinta, or Effient? no Aspirin? no  Patient confirms/reports the following medications:  Current Outpatient Prescriptions  Medication Sig Dispense Refill  . Fluticasone-Salmeterol (ADVAIR) 250-50 MCG/DOSE AEPB Inhale 1 puff into the lungs 2 (two) times daily. 14 each 11  . oxyCODONE-acetaminophen (PERCOCET) 10-325 MG tablet Take 1 tablet by mouth every 4 (four) hours as needed for pain.    . varenicline (CHANTIX) 1 MG tablet Take 1 mg by mouth daily.    Marland Kitchen albuterol (PROAIR HFA) 108 (90 Base) MCG/ACT inhaler Inhale 2 puffs into the lungs every 6 (six) hours as needed for wheezing or shortness of breath. (Patient not taking: Reported on 06/28/2016) 1 Inhaler 11  . fluconazole (DIFLUCAN) 150 MG tablet Take 1 tablet (150 mg total) by mouth once. (Patient not taking: Reported on 06/28/2016) 5 tablet 3   No current facility-administered medications for this visit.    Patient confirms/reports the following allergies:  Allergies  Allergen Reactions  . 2,4-D Dimethylamine (Amisol) Itching    No orders of the defined types were placed in this encounter.    AUTHORIZATION INFORMATION Primary Insurance: 1D#: Group #:  Secondary Insurance: 1D#: Group #:  SCHEDULE INFORMATION: Date:  07/08/2016 Time: Location: [facility]

## 2016-07-02 ENCOUNTER — Encounter: Payer: Self-pay | Admitting: *Deleted

## 2016-07-02 LAB — HM MAMMOGRAPHY

## 2016-07-05 NOTE — Discharge Instructions (Signed)

## 2016-07-08 ENCOUNTER — Encounter: Payer: Medicaid Other | Admitting: Speech Pathology

## 2016-07-08 ENCOUNTER — Ambulatory Visit: Payer: Medicaid Other | Admitting: Anesthesiology

## 2016-07-08 ENCOUNTER — Ambulatory Visit
Admission: RE | Admit: 2016-07-08 | Discharge: 2016-07-08 | Disposition: A | Discharge status: Home / Self Care | Payer: Medicaid Other | Source: Ambulatory Visit | Attending: Gastroenterology | Admitting: Gastroenterology

## 2016-07-08 ENCOUNTER — Encounter
Admission: RE | Disposition: A | Discharge status: Home / Self Care | Payer: Self-pay | Source: Ambulatory Visit | Attending: Gastroenterology

## 2016-07-08 DIAGNOSIS — Z85828 Personal history of other malignant neoplasm of skin: Secondary | ICD-10-CM | POA: Insufficient documentation

## 2016-07-08 DIAGNOSIS — J449 Chronic obstructive pulmonary disease, unspecified: Secondary | ICD-10-CM | POA: Diagnosis not present

## 2016-07-08 DIAGNOSIS — F17210 Nicotine dependence, cigarettes, uncomplicated: Secondary | ICD-10-CM | POA: Diagnosis not present

## 2016-07-08 DIAGNOSIS — Z1211 Encounter for screening for malignant neoplasm of colon: Secondary | ICD-10-CM | POA: Diagnosis not present

## 2016-07-08 DIAGNOSIS — Z79899 Other long term (current) drug therapy: Secondary | ICD-10-CM | POA: Diagnosis not present

## 2016-07-08 DIAGNOSIS — Z888 Allergy status to other drugs, medicaments and biological substances status: Secondary | ICD-10-CM | POA: Diagnosis not present

## 2016-07-08 DIAGNOSIS — K641 Second degree hemorrhoids: Secondary | ICD-10-CM | POA: Diagnosis not present

## 2016-07-08 DIAGNOSIS — Z90710 Acquired absence of both cervix and uterus: Secondary | ICD-10-CM | POA: Insufficient documentation

## 2016-07-08 HISTORY — DX: Presence of dental prosthetic device (complete) (partial): Z97.2

## 2016-07-08 HISTORY — PX: COLONOSCOPY WITH PROPOFOL: SHX5780

## 2016-07-08 SURGERY — COLONOSCOPY WITH PROPOFOL
Anesthesia: Monitor Anesthesia Care | Wound class: Contaminated

## 2016-07-08 MED ORDER — STERILE WATER FOR IRRIGATION IR SOLN
Status: DC | PRN
  Administered 2016-07-08: 10:00:00

## 2016-07-08 MED ORDER — LACTATED RINGERS IV SOLN
INTRAVENOUS | Status: DC
  Administered 2016-07-08: 10:00:00 via INTRAVENOUS

## 2016-07-08 MED ORDER — LIDOCAINE HCL (CARDIAC) 20 MG/ML IV SOLN
INTRAVENOUS | Status: DC | PRN
  Administered 2016-07-08: 40 mg via INTRAVENOUS

## 2016-07-08 MED ORDER — PROPOFOL 10 MG/ML IV BOLUS
INTRAVENOUS | Status: DC | PRN
  Administered 2016-07-08 (×5): 40 mg via INTRAVENOUS

## 2016-07-08 SURGICAL SUPPLY — 23 items
CANISTER SUCT 1200ML W/VALVE (MISCELLANEOUS) ×2 IMPLANT
CLIP HMST 235XBRD CATH ROT (MISCELLANEOUS) IMPLANT
CLIP RESOLUTION 360 11X235 (MISCELLANEOUS)
FCP ESCP3.2XJMB 240X2.8X (MISCELLANEOUS)
FORCEPS BIOP RAD 4 LRG CAP 4 (CUTTING FORCEPS) IMPLANT
FORCEPS BIOP RJ4 240 W/NDL (MISCELLANEOUS)
FORCEPS ESCP3.2XJMB 240X2.8X (MISCELLANEOUS) IMPLANT
GOWN CVR UNV OPN BCK APRN NK (MISCELLANEOUS) ×2 IMPLANT
GOWN ISOL THUMB LOOP REG UNIV (MISCELLANEOUS) ×4
INJECTOR VARIJECT VIN23 (MISCELLANEOUS) IMPLANT
KIT DEFENDO VALVE AND CONN (KITS) IMPLANT
KIT ENDO PROCEDURE OLY (KITS) ×2 IMPLANT
MARKER SPOT ENDO TATTOO 5ML (MISCELLANEOUS) IMPLANT
PAD GROUND ADULT SPLIT (MISCELLANEOUS) IMPLANT
PROBE APC STR FIRE (PROBE) IMPLANT
RETRIEVER NET ROTH 2.5X230 LF (MISCELLANEOUS) ×2 IMPLANT
SNARE SHORT THROW 13M SML OVAL (MISCELLANEOUS) IMPLANT
SNARE SHORT THROW 30M LRG OVAL (MISCELLANEOUS) IMPLANT
SNARE SNG USE RND 15MM (INSTRUMENTS) IMPLANT
SPOT EX ENDOSCOPIC TATTOO (MISCELLANEOUS)
TRAP ETRAP POLY (MISCELLANEOUS) IMPLANT
VARIJECT INJECTOR VIN23 (MISCELLANEOUS)
WATER STERILE IRR 250ML POUR (IV SOLUTION) ×2 IMPLANT

## 2016-07-08 NOTE — H&P (Signed)
Lucilla Lame, MD [facility] [place], [place] [address] Phone: [phone] Fax : [phone]  Primary Care Physician:  Leata Mouse, NP Primary Gastroenterologist:  Dr. Allen Norris  Pre-Procedure History & Physical: HPI:  Caroline Conley is a 50 y.o. female is here for a screening colonoscopy.   Past Medical History:  Diagnosis Date  . Cancer ([place]) 01/2016   basal cell   . Wears dentures    full upper and lower    Past Surgical History:  Procedure Laterality Date  . ABDOMINAL HYSTERECTOMY    . APPENDECTOMY    . BACK SURGERY     this will make third time  . CARDIAC CATHETERIZATION  09/20/14   [facility] - Dr. Clayborn Bigness  . DEBRIDEMENT TENNIS ELBOW    . METATARSAL OSTEOTOMY  05/18/2013  . ROTATOR CUFF REPAIR    . TONSILLECTOMY      Prior to Admission medications   Medication Sig Start Date End Date Taking? Authorizing Provider  albuterol (PROAIR HFA) 108 (90 Base) MCG/ACT inhaler Inhale 2 puffs into the lungs every 6 (six) hours as needed for wheezing or shortness of breath. 06/07/16  Yes Amy Overton Mam, NP  Fluticasone-Salmeterol (ADVAIR) 250-50 MCG/DOSE AEPB Inhale 1 puff into the lungs 2 (two) times daily. 06/07/16  Yes Amy Overton Mam, NP  Na Sulfate-K Sulfate-Mg Sulf (SUPREP BOWEL PREP KIT) 17.5-3.13-1.6 GM/180ML SOLN Take 1 Dose by mouth once. 06/28/16  Yes Lucilla Lame, MD  oxyCODONE-acetaminophen (PERCOCET) 10-325 MG tablet Take 1 tablet by mouth every 4 (four) hours as needed for pain.   Yes Historical Provider, MD    Allergies as of 06/28/2016 - Review Complete 06/26/2016  Allergen Reaction Noted  . 2,4-d dimethylamine (amisol) Itching 10/16/2015    Family History  Problem Relation Age of Onset  . Cancer Father   . Stroke Father   . Arthritis/Rheumatoid Father   . Diabetes Mother   . Diabetes Father   . High blood pressure Mother   . High blood pressure Father   . Gout Mother     Social History   Social History  . Marital status: Married    Spouse  name: N/A  . Number of children: N/A  . Years of education: N/A   Occupational History  . Not on file.   Social History Main Topics  . Smoking status: Current Every Day Smoker    Packs/day: 1.00    Years: 30.00    Types: Cigarettes  . Smokeless tobacco: Not on file  . Alcohol use No  . Drug use: No  . Sexual activity: Yes   Other Topics Concern  . Not on file   Social History Narrative  . No narrative on file    Review of Systems: See HPI, otherwise negative ROS  Physical Exam: BP 135/87   Pulse 82   Temp 98.2 F (36.8 C) (Temporal)   Resp 16   Ht '5\' 6"'  (1.676 m)   Wt 129 lb (58.5 kg)   SpO2 96%   BMI 20.82 kg/m  General:   Alert,  pleasant and cooperative in NAD Head:  Normocephalic and atraumatic. Neck:  Supple; no masses or thyromegaly. Lungs:  Clear throughout to auscultation.    Heart:  Regular rate and rhythm. Abdomen:  Soft, nontender and nondistended. Normal bowel sounds, without guarding, and without rebound.   Neurologic:  Alert and  oriented x4;  grossly normal neurologically.  Impression/Plan: Caroline Conley is now here to undergo a screening colonoscopy.  Risks, benefits, and alternatives regarding colonoscopy have been reviewed with the patient.  Questions have been answered.  All parties agreeable.

## 2016-07-08 NOTE — Anesthesia Postprocedure Evaluation (Signed)
Anesthesia Post Note  Patient: Caroline Conley  Procedure(s) Performed: Procedure(s) (LRB): COLONOSCOPY WITH PROPOFOL (N/A)  Patient location during evaluation: PACU Anesthesia Type: MAC Level of consciousness: awake and alert Pain management: pain level controlled Vital Signs Assessment: post-procedure vital signs reviewed and stable Respiratory status: spontaneous breathing, nonlabored ventilation, respiratory function stable and patient connected to nasal cannula oxygen Cardiovascular status: stable and blood pressure returned to baseline Anesthetic complications: no    Sulma Ruffino C

## 2016-07-08 NOTE — Transfer of Care (Signed)
Immediate Anesthesia Transfer of Care Note  Patient: Caroline Conley  Procedure(s) Performed: Procedure(s): COLONOSCOPY WITH PROPOFOL (N/A)  Patient Location: PACU  Anesthesia Type: MAC  Level of Consciousness: awake, alert  and patient cooperative  Airway and Oxygen Therapy: Patient Spontanous Breathing and Patient connected to supplemental oxygen  Post-op Assessment: Post-op Vital signs reviewed, Patient's Cardiovascular Status Stable, Respiratory Function Stable, Patent Airway and No signs of Nausea or vomiting  Post-op Vital Signs: Reviewed and stable  Complications: No apparent anesthesia complications

## 2016-07-08 NOTE — Anesthesia Preprocedure Evaluation (Signed)
Anesthesia Evaluation  Patient identified by MRN, date of birth, ID band Patient awake    Reviewed: Allergy & Precautions, NPO status , Patient's Chart, lab work & pertinent test results  Airway Mallampati: II  TM Distance: >3 FB Neck ROM: Full    Dental  (+) Upper Dentures, Lower Dentures   Pulmonary COPD, Current Smoker,  No home O2. Mets 4+. No need for rescue inhaler  Inspiratory and expiratory wheezes faint  breath sounds clear to auscultation + wheezing      Cardiovascular negative cardio ROS Normal cardiovascular exam Rhythm:Regular Rate:Normal     Neuro/Psych negative neurological ROS  negative psych ROS   GI/Hepatic negative GI ROS, Neg liver ROS,   Endo/Other  negative endocrine ROS  Renal/GU negative Renal ROS  negative genitourinary   Musculoskeletal negative musculoskeletal ROS (+)   Abdominal   Peds negative pediatric ROS (+)  Hematology negative hematology ROS (+)   Anesthesia Other Findings   Reproductive/Obstetrics negative OB ROS                             Anesthesia Physical Anesthesia Plan  ASA: II  Anesthesia Plan: MAC   Post-op Pain Management:    Induction: Intravenous  Airway Management Planned:   Additional Equipment:   Intra-op Plan:   Post-operative Plan:   Informed Consent: I have reviewed the patients History and Physical, chart, labs and discussed the procedure including the risks, benefits and alternatives for the proposed anesthesia with the patient or authorized representative who has indicated his/her understanding and acceptance.   Dental advisory given  Plan Discussed with: CRNA  Anesthesia Plan Comments:         Anesthesia Quick Evaluation

## 2016-07-08 NOTE — Op Note (Signed)
[facility] Patient Name: Caroline Conley Procedure Date: 07/08/2016 10:13 AM MRN: [id number] Account #: [id number] Date of Birth: 11/13/1966 Admit Type: Outpatient Age: 50 Room: [facility] OR ROOM 01 Gender: Female Note Status: Finalized Procedure:            Colonoscopy Indications:          Screening for colorectal malignant neoplasm Providers:            Lucilla Lame MD, MD Referring MD:         Leata Mouse (Referring MD) Medicines:            Propofol per Anesthesia Complications:        No immediate complications. Procedure:            Pre-Anesthesia Assessment:                       - Prior to the procedure, a History and Physical was                        performed, and patient medications and allergies were                        reviewed. The patient's tolerance of previous                        anesthesia was also reviewed. The risks and benefits of                        the procedure and the sedation options and risks were                        discussed with the patient. All questions were                        answered, and informed consent was obtained. Prior                        Anticoagulants: The patient has taken no previous                        anticoagulant or antiplatelet agents. ASA Grade                        Assessment: II - A patient with mild systemic disease.                        After reviewing the risks and benefits, the patient was                        deemed in satisfactory condition to undergo the                        procedure.                       After obtaining informed consent, the colonoscope was                        passed under direct vision. Throughout the procedure,  the patient's blood pressure, pulse, and oxygen                        saturations were monitored continuously. The was                        introduced through the anus and advanced to the the           cecum, identified by appendiceal orifice and ileocecal                        valve. The colonoscopy was performed without                        difficulty. The patient tolerated the procedure well.                        The quality of the bowel preparation was fair. Findings:      The perianal and digital rectal examinations were normal.      Non-bleeding internal hemorrhoids were found during retroflexion. The       hemorrhoids were Grade II (internal hemorrhoids that prolapse but reduce       spontaneously). Impression:           - Preparation of the colon was fair.                       - Non-bleeding internal hemorrhoids.                       - No specimens collected. Recommendation:       - Repeat colonoscopy in 10 years for screening unless                        any change in family history or lower GI problems. Procedure Code(s):    --- Professional ---                       [phone], Colonoscopy, flexible; diagnostic, including                        collection of specimen(s) by brushing or washing, when                        performed (separate procedure) Diagnosis Code(s):    --- Professional ---                       Z12.11, Encounter for screening for malignant neoplasm                        of colon CPT copyright 2016 American Medical Association. All rights reserved. The codes documented in this report are preliminary and upon coder review may  be revised to meet current compliance requirements. Lucilla Lame MD, MD 07/08/2016 10:40:53 AM This report has been signed electronically. Number of Addenda: 0 Note Initiated On: 07/08/2016 10:13 AM Scope Withdrawal Time: 0 hours 7 minutes 53 seconds  Total Procedure Duration: 0 hours 11 minutes 44 seconds       [facility]

## 2016-07-09 ENCOUNTER — Encounter: Payer: Self-pay | Admitting: Gastroenterology

## 2016-07-09 ENCOUNTER — Telehealth: Payer: Self-pay | Admitting: Family Medicine

## 2016-07-09 NOTE — Telephone Encounter (Signed)
Mammogram was normal. Reviewed with patient.

## 2016-07-24 ENCOUNTER — Telehealth: Payer: Self-pay | Admitting: Family Medicine

## 2016-07-24 NOTE — Telephone Encounter (Signed)
Pt has been seeing Dr. Kirkland Hun for skin cancer at [facility].  She has medicaid now so she needs a referral to continue to be seen for her melanoma.  She has to go every 3 to six months.  The fax number for [facility] is [phone]

## 2016-07-24 NOTE — Telephone Encounter (Signed)
Referral form completed and will be faxed after [place] signs.[place]

## 2016-08-01 ENCOUNTER — Ambulatory Visit (INDEPENDENT_AMBULATORY_CARE_PROVIDER_SITE_OTHER): Payer: Medicaid Other

## 2016-08-01 ENCOUNTER — Ambulatory Visit (INDEPENDENT_AMBULATORY_CARE_PROVIDER_SITE_OTHER): Payer: Medicaid Other | Admitting: Podiatry

## 2016-08-01 ENCOUNTER — Encounter: Payer: Self-pay | Admitting: Podiatry

## 2016-08-01 DIAGNOSIS — M2012 Hallux valgus (acquired), left foot: Secondary | ICD-10-CM

## 2016-08-01 DIAGNOSIS — R52 Pain, unspecified: Secondary | ICD-10-CM

## 2016-08-01 NOTE — Progress Notes (Signed)
   Subjective:    Patient ID: Caroline Conley, female    DOB: [birth year], 50 y.o.   MRN: [id number]  HPI  50 year old female presents the also concerns of a painful left bunion ongoing for several weeks. She's of the areas tender with shoes as well as pressure. She states that she has noticed the bunion progressing over quite some time and is been getting worse. She has tried shoe gear changes, offloading in symptoms. She denies any recent injury or trauma. No numbness or tingling.   Review of Systems  All other systems reviewed and are negative.      Objective:   Physical Exam General: AAO x3, NAD  Dermatological: Skin is warm, dry and supple bilateral. Nails x 10 are well manicured; remaining integument appears unremarkable at this time. There are no open sores, no preulcerative lesions, no rash or signs of infection present.  Vascular: Dorsalis Pedis artery and Posterior Tibial artery pedal pulses are 2/4 bilateral with immedate capillary fill time. Pedal hair growth present.  There is no pain with calf compression, swelling, warmth, erythema.   Neruologic: Grossly intact via light touch bilateral. Vibratory intact via tuning fork bilateral. Protective threshold with Semmes Wienstein monofilament intact to all pedal sites bilateral. .   Musculoskeletal: Moderate HAV is present on the left side. There is tenderness on the medial aspect of the first metatarsal head. There is no pain or crepitation with first MTPJ range of motion. No hypermobility is present. Mild irritation of the medial aspect of the first metatarsal head from shoe gear irritation. No other areas of tenderness at this time. MMT 5/5. Range of motion intact.      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 Moderate HAV is present. No  evidence of acute fracture or stress fracture identified at this time.  -Discussed both conservative and surgical treatment options. Discussed with her shoe gear modifications, offloading, padding, orthotics. At this time she states that she has attempted conservative treatment at this time should pursue a surgical intervention.  -I discussed with her surgery including [place] bunionectomy and screw fixation. She wishes to proceed.  -The incision placement as well as the postoperative course was discussed with the patient. I discussed risks of the surgery which include, but not limited to, infection, bleeding, pain, swelling, need for further surgery, delayed or nonhealing, painful or ugly scar, numbness or sensation changes, over/under correction, recurrence, transfer lesions, further deformity, hardware failure, DVT/PE, loss of toe/foot. Patient understands these risks and wishes to proceed with surgery. The surgical consent was reviewed with the patient all 3 pages were signed. No promises or guarantees were given to the outcome of the procedure. All questions were answered to the best of my ability. Before the surgery the patient was encouraged to call the office if there is any further questions. The surgery will be performed at the [facility] on an outpatient basis.  *She is aware that smoking greatly increases her risk of complications including, but not limited to, delayed or nonhealing, amputation, blood clots. She states that she has gone to try to quit before surgery and throughout the postoperative recovery. She went to the similar issue with having back surgery and she was able to quit prior.  Celesta Gentile, DPM

## 2016-08-01 NOTE — Patient Instructions (Signed)

## 2016-08-02 DIAGNOSIS — M2010 Hallux valgus (acquired), unspecified foot: Secondary | ICD-10-CM | POA: Insufficient documentation

## 2016-08-08 ENCOUNTER — Telehealth: Payer: Self-pay | Admitting: *Deleted

## 2016-08-08 NOTE — Telephone Encounter (Signed)
"  My name is Caroline Conley, thank you."

## 2016-08-12 NOTE — Telephone Encounter (Signed)
I'm returning your call.  How can I help you?  "They told me to call you to set up my surgery."  When would you like to schedule?  His next available date is 09/04/2016.  "That date will be fine."  You can go ahead and register with the surgical center.  "I have already done that, I had to put a date in so I put the 14th in.  Is that going to be a problem?"  No, just call and let them know to change the date.  Someone will call you a day or two prior to surgery date with the arrival time.  "Okay, thank you for your help."

## 2016-08-29 ENCOUNTER — Encounter: Payer: Self-pay | Admitting: Family Medicine

## 2016-08-29 ENCOUNTER — Ambulatory Visit (INDEPENDENT_AMBULATORY_CARE_PROVIDER_SITE_OTHER): Payer: Medicaid Other | Admitting: Family Medicine

## 2016-08-29 VITALS — BP 135/76 | HR 77 | Temp 97.8°F | Resp 16 | Ht 66.0 in | Wt 132.0 lb

## 2016-08-29 DIAGNOSIS — F17200 Nicotine dependence, unspecified, uncomplicated: Secondary | ICD-10-CM

## 2016-08-29 DIAGNOSIS — Z23 Encounter for immunization: Secondary | ICD-10-CM

## 2016-08-29 DIAGNOSIS — J449 Chronic obstructive pulmonary disease, unspecified: Secondary | ICD-10-CM

## 2016-08-29 DIAGNOSIS — Z720 Tobacco use: Secondary | ICD-10-CM

## 2016-08-29 MED ORDER — NICOTINE 10 MG/ML NA SOLN: NASAL | 2 refills | Status: DC

## 2016-08-29 NOTE — Patient Instructions (Signed)
Continue your efforts to quit smoking. Keep using inhalers as directed.   If you want the pneumovax- please get it at the pharmacy or health department in 4-6 weeks.

## 2016-08-29 NOTE — Assessment & Plan Note (Signed)
Pt has failed chantix, wellbutrin and nicotine patches. Would like to try nicotine nasal spray. Sent to pharmacy on file.

## 2016-08-29 NOTE — Assessment & Plan Note (Signed)
Mild obstruction. Bronchodilator not improved. Will repeat spirometry- not sure of the quality of the testing. Encouraged smoking cessation. Continue advair. Recheck 6 mos.

## 2016-08-29 NOTE — Progress Notes (Signed)
Subjective:    Patient ID: Caroline Conley, female    DOB: 05/17/1966, 50 y.o.   MRN: [id number]  HPI: Caroline Conley is a 50 y.o. female presenting on 08/29/2016 for COPD (Need Spirometry)   HPI  Pt presents for spirometry today. Breathing is doing well with Advair. Using albuterol inhaler PRN every 3-5 days. Trying to quit smoking.  1 pack per day for 40 years. Down to 1/2 pack per day. Has failed Chantix, Zyban, and nicotine patches.   Past Medical History:  Diagnosis Date  . Cancer ([place]) 01/2016   basal cell   . Wears dentures    full upper and lower    Current Outpatient Prescriptions on File Prior to Visit  Medication Sig  . albuterol (PROAIR HFA) 108 (90 Base) MCG/ACT inhaler Inhale 2 puffs into the lungs every 6 (six) hours as needed for wheezing or shortness of breath.  . Fluticasone-Salmeterol (ADVAIR) 250-50 MCG/DOSE AEPB Inhale 1 puff into the lungs 2 (two) times daily.  . Na Sulfate-K Sulfate-Mg Sulf (SUPREP BOWEL PREP KIT) 17.5-3.13-1.6 GM/180ML SOLN Take 1 Dose by mouth once.  Marland Kitchen oxyCODONE-acetaminophen (PERCOCET) 10-325 MG tablet Take 1 tablet by mouth every 4 (four) hours as needed for pain.   No current facility-administered medications on file prior to visit.     Review of Systems  Constitutional: Negative for chills and fever.  HENT: Negative.   Respiratory: Positive for cough. Negative for chest tightness, shortness of breath and wheezing.   Cardiovascular: Negative for chest pain and leg swelling.  Gastrointestinal: Negative for abdominal pain, constipation, diarrhea, nausea and vomiting.  Endocrine: Negative.  Negative for cold intolerance, heat intolerance, polydipsia, polyphagia and polyuria.  Genitourinary: Negative for difficulty urinating and dysuria.  Musculoskeletal: Negative.   Neurological: Negative for dizziness, light-headedness and numbness.  Psychiatric/Behavioral: Negative.    Per HPI unless specifically indicated above     Objective:    BP 135/76 (BP Location: Right Arm, Patient Position: Sitting, Cuff Size: Small)   Pulse 77   Temp 97.8 F (36.6 C) (Oral)   Resp 16   Ht '5\' 6"'  (1.676 m)   Wt 132 lb (59.9 kg)   BMI 21.31 kg/m   Wt Readings from Last 3 Encounters:  08/29/16 132 lb (59.9 kg)  07/08/16 129 lb (58.5 kg)  06/26/16 131 lb 6.4 oz (59.6 kg)    Physical Exam  Constitutional: She is oriented to person, place, and time. She appears well-developed and well-nourished.  HENT:  Head: Normocephalic and atraumatic.  Neck: Neck supple.  Cardiovascular: Normal rate, regular rhythm and normal heart sounds.  Exam reveals no gallop and no friction rub.   No murmur heard. Pulmonary/Chest: Effort normal and breath sounds normal. She has no wheezes. She exhibits no tenderness.  Abdominal: Soft. Normal appearance and bowel sounds are normal. She exhibits no distension and no mass. There is no tenderness. There is no rebound and no guarding.  Musculoskeletal: Normal range of motion. She exhibits no edema or tenderness.  Lymphadenopathy:    She has no cervical adenopathy.  Neurological: She is alert and oriented to person, place, and time.  Skin: Skin is warm and dry.   Results for orders placed or performed in visit on 07/09/16  HM MAMMOGRAPHY  Result Value Ref Range   HM Mammogram 0-4 Bi-Rad 0-4 Bi-Rad, Self Reported Normal      Assessment & Plan:   Problem List Items Addressed This Visit      Respiratory  COPD (chronic obstructive pulmonary disease) (HCC) - Primary    Mild obstruction. Bronchodilator not improved. Will repeat spirometry- not sure of the quality of the testing. Encouraged smoking cessation. Continue advair. Recheck 6 mos.       Relevant Medications   Nicotine 10 MG/ML SOLN   Other Relevant Orders   Spirometry: Pre & Post Eval (Completed)     Other   Tobacco use    Pt has failed chantix, wellbutrin and nicotine patches. Would like to try nicotine nasal spray. Sent to pharmacy on file.          Other Visit Diagnoses    Need for Tdap vaccination       Relevant Orders   Tdap vaccine greater than or equal to 7yo IM (Completed)   Needs flu shot       Relevant Orders   Flu Vaccine QUAD 36+ mos PF IM (Fluarix & Fluzone Quad PF) (Completed)   Smoker       Relevant Medications   Nicotine 10 MG/ML SOLN      Meds ordered this encounter  Medications  . Nicotine 10 MG/ML SOLN    Sig: 1 spray each nostril every 1-2 hours as needed for nicotine dependence.    Dispense:  10 mL    Refill:  2    Order Specific Question:   Supervising Provider    Answer:   Arlis Porta [[id number]]      Follow up plan: Return in about 6 months (around 02/26/2017), or if symptoms worsen or fail to improve.

## 2016-09-04 ENCOUNTER — Encounter: Payer: Self-pay | Admitting: Podiatry

## 2016-09-04 DIAGNOSIS — M2012 Hallux valgus (acquired), left foot: Secondary | ICD-10-CM | POA: Diagnosis not present

## 2016-09-06 NOTE — Progress Notes (Signed)
DOS 09.20.2017 Left Foot Surgical Correction of Bunion with Screw Fixation

## 2016-09-12 ENCOUNTER — Encounter: Payer: Self-pay | Admitting: Podiatry

## 2016-09-12 ENCOUNTER — Ambulatory Visit (INDEPENDENT_AMBULATORY_CARE_PROVIDER_SITE_OTHER): Payer: Medicaid Other | Admitting: Podiatry

## 2016-09-12 ENCOUNTER — Ambulatory Visit (INDEPENDENT_AMBULATORY_CARE_PROVIDER_SITE_OTHER): Payer: Medicaid Other

## 2016-09-12 DIAGNOSIS — M2012 Hallux valgus (acquired), left foot: Secondary | ICD-10-CM

## 2016-09-12 DIAGNOSIS — Z09 Encounter for follow-up examination after completed treatment for conditions other than malignant neoplasm: Secondary | ICD-10-CM

## 2016-09-12 MED ORDER — OXYCODONE-ACETAMINOPHEN 5-325 MG PO TABS: 1.0000 | ORAL_TABLET | Freq: Four times a day (QID) | ORAL | 0 refills | Status: DC | PRN

## 2016-09-12 NOTE — Progress Notes (Signed)
Subjective: Caroline Conley is a 50 y.o. is seen today in office s/p right [place] bunionectomy preformed on 09/04/16. She said that she is taking Percocet 3 times a day to keep the pain away but she has not been needing any additional. She is asking for refill today she'll run out shortly. She is instructed decreased mental Percocet she's been taking. She states that she is doing well and she is very happy with result today. Denies any systemic complaints such as fevers, chills, nausea, vomiting. No calf pain, chest pain, shortness of breath.   Objective: General: No acute distress, AAOx3  DP/PT pulses palpable 2/4, CRT < 3 sec to all digits.  Protective sensation intact. Motor function intact.  Right foot: Incision is well coapted without any evidence of dehiscence and sutures intact. There is no surrounding erythema, ascending cellulitis, fluctuance, crepitus, malodor, drainage/purulence. There is mild edema around the surgical site. There is no pain along the surgical site. There is no pain or crepitation restriction first MTPJ range of motion. No other areas of tenderness to bilateral lower extremities.  No other open lesions or pre-ulcerative lesions.  No pain with calf compression, swelling, warmth, erythema.   Assessment and Plan:  Status post Right bunionectomy, doing well with no complications   -Treatment options discussed including all alternatives, risks, and complications -X-rays were obtained and reviewed. Hardware intact. No evidence of acute fracture. -Antibiotic on it was applied to the incision followed by dry dressing. Keep the dressing clean, dry, intact. -Continue cam boot. She can weight-bear as tolerated. She has been using crutches as the operative that she can transition to weightbearing as tolerated. -Ice/elevation -Pain medication as needed. -Monitor for any clinical signs or symptoms of infection and DVT/PE and directed to call the office immediately should any occur or  go to the ER. -Follow-up in 1 week for suture removal or sooner if any problems arise. In the meantime, encouraged to call the office with any questions, concerns, change in symptoms.   Celesta Gentile, DPM

## 2016-09-19 ENCOUNTER — Encounter: Payer: Self-pay | Admitting: Podiatry

## 2016-09-19 ENCOUNTER — Ambulatory Visit (INDEPENDENT_AMBULATORY_CARE_PROVIDER_SITE_OTHER): Payer: Medicaid Other | Admitting: Podiatry

## 2016-09-19 DIAGNOSIS — M2012 Hallux valgus (acquired), left foot: Secondary | ICD-10-CM

## 2016-09-19 DIAGNOSIS — Z09 Encounter for follow-up examination after completed treatment for conditions other than malignant neoplasm: Secondary | ICD-10-CM

## 2016-09-21 NOTE — Progress Notes (Addendum)
Subjective: Caroline Conley is a 50 y.o. is seen today in office s/p right Austin bunionectomy preformed on 09/04/16. She states that she is doing well. She has been walking more in the CAM boot but sill using crutches some of the time not because of pain but to let it heel. She hs decreased the amount of pain medication she has been taking. Denies any systemic complaints such as fevers, chills, nausea, vomiting. No calf pain, chest pain, shortness of breath.   Objective: General: No acute distress, AAOx3  DP/PT pulses palpable 2/4, CRT < 3 sec to all digits.  Protective sensation intact. Motor function intact.  Right foot: Incision is well coapted without any evidence of dehiscence and sutures intact. There is no surrounding erythema, ascending cellulitis, fluctuance, crepitus, malodor, drainage/purulence. There is mild edema around the surgical site. There is no pain along the surgical site. There is no pain or crepitation restriction first MTPJ range of motion. Hallux is in rectus position.  No other areas of tenderness to bilateral lower extremities.  No other open lesions or pre-ulcerative lesions.  No pain with calf compression, swelling, warmth, erythema.   Assessment and Plan:  Status post Right bunionectomy, doing well with no complications   -Treatment options discussed including all alternatives, risks, and complications -Sutures removed today. Antibiotic ointment applied followed by a DSD. She can start to shower early next week. Cover with similar dressing which we showed her how to do today.  -Continue CAM boot. She can be WBAT in CAM boot but must wear the boot at all times.  -Ice/elevation -Pain medication as needed. -Bunion splint dispensed to help hold the toe in a corrected position.  -Monitor for any clinical signs or symptoms of infection and DVT/PE and directed to call the office immediately should any occur or go to the ER. -Follow-up in in 2 weeks or sooner if any problems  arise. In the meantime, encouraged to call the office with any questions, concerns, change in symptoms.   Celesta Gentile, DPM

## 2016-10-04 ENCOUNTER — Encounter: Payer: Medicaid Other | Admitting: Podiatry

## 2016-10-17 ENCOUNTER — Encounter: Payer: Medicaid Other | Admitting: Podiatry

## 2016-11-18 ENCOUNTER — Encounter: Payer: Self-pay | Admitting: Family Medicine

## 2016-11-18 ENCOUNTER — Ambulatory Visit (INDEPENDENT_AMBULATORY_CARE_PROVIDER_SITE_OTHER): Payer: Medicaid Other | Admitting: Family Medicine

## 2016-11-18 VITALS — BP 126/81 | HR 100 | Temp 98.3°F | Resp 16 | Ht 66.0 in | Wt 131.0 lb

## 2016-11-18 DIAGNOSIS — Z720 Tobacco use: Secondary | ICD-10-CM

## 2016-11-18 DIAGNOSIS — J0110 Acute frontal sinusitis, unspecified: Secondary | ICD-10-CM

## 2016-11-18 DIAGNOSIS — J441 Chronic obstructive pulmonary disease with (acute) exacerbation: Secondary | ICD-10-CM

## 2016-11-18 DIAGNOSIS — J410 Simple chronic bronchitis: Secondary | ICD-10-CM | POA: Diagnosis not present

## 2016-11-18 DIAGNOSIS — Z716 Tobacco abuse counseling: Secondary | ICD-10-CM | POA: Diagnosis not present

## 2016-11-18 DIAGNOSIS — B370 Candidal stomatitis: Secondary | ICD-10-CM | POA: Insufficient documentation

## 2016-11-18 MED ORDER — LEVOFLOXACIN 500 MG PO TABS: 500.0000 mg | ORAL_TABLET | Freq: Every day | ORAL | 0 refills | Status: DC

## 2016-11-18 MED ORDER — IPRATROPIUM-ALBUTEROL 0.5-2.5 (3) MG/3ML IN SOLN: 3.0000 mL | Freq: Once | RESPIRATORY_TRACT | Status: DC

## 2016-11-18 MED ORDER — ALBUTEROL SULFATE (2.5 MG/3ML) 0.083% IN NEBU: 2.5000 mg | INHALATION_SOLUTION | Freq: Four times a day (QID) | RESPIRATORY_TRACT | 5 refills | Status: DC | PRN

## 2016-11-18 MED ORDER — PREDNISONE 50 MG PO TABS: 50.0000 mg | ORAL_TABLET | Freq: Every day | ORAL | 0 refills | Status: DC

## 2016-11-18 MED ORDER — NICOTINE 10 MG/ML NA SOLN: NASAL | 1 refills | Status: DC

## 2016-11-18 MED ORDER — FLUCONAZOLE 200 MG PO TABS: 200.0000 mg | ORAL_TABLET | Freq: Once | ORAL | 1 refills | Status: DC | PRN

## 2016-11-18 MED ORDER — HYDROCOD POLST-CPM POLST ER 10-8 MG/5ML PO SUER: 5.0000 mL | Freq: Two times a day (BID) | ORAL | 0 refills | Status: DC | PRN

## 2016-11-18 NOTE — Assessment & Plan Note (Signed)
No yeast infection actively, h/o chronic recurrent oral thrush / yeast secondary to advair steroid inhaler - Preventative rx given for fluconazole use PRN, follow-up as needed

## 2016-11-18 NOTE — Assessment & Plan Note (Signed)
Currently mild AECOPD today, trigger sinusitis, URI. Otherwise, normally well controlled on Advair. Last exac >1 yr ago - No hypoxia (98% on RA), afebrile, no recent hospitalization  Plan: 1. Duoneb x 1 in office today - improved, given neb tubing 2. Order nebulizer machine through [facility], demographics sent, awaiting follow-up 3. Start Prednisone 50mg  x 5 day steroid burst 4. Start Levaquin 500mg  daily x7 days with sinusitis as well 5. Use albuterol q 4 hr regularly x 2-3 days. Continue maintenance advair. 6. Rx Tussionex printed, counseled on caution with codeine cough medicine 7. Smoking cessation, once acute exac resolved, start NRT quit smoking 8. Return criteria given

## 2016-11-18 NOTE — Addendum Note (Signed)
Addended by: Olin Hauser on: 11/18/2016 12:08 PM   Modules accepted: Orders

## 2016-11-18 NOTE — Assessment & Plan Note (Addendum)
Active smoker, now reduced with current COPD exac No successful prior quit attempts, failed Chantix (dreams), Wellbutrin, NRT gum, patches  Plan: 1. Re-try NRT Nasal spray, last rx 08/2016 never obtained due to PA. Re-sent rx, awaiting PA. Start 1-2 doses per hour, titrate up as needed, max doses 5 per hour (1 dose = 2 sprays), 40 per day, continue stable dose 8 weeks, then taper off 4-6 weeks. Quit smoking date of start using nasal spray, start anti-histamine, saline flush as well, hold for 1-2 weeks while sinus treated 2. [place] Quitline info given 3. Follow-up 4-8 weeks

## 2016-11-18 NOTE — Progress Notes (Addendum)
Subjective:    Patient ID: Caroline Conley, female    DOB: 04-07-1966, 50 y.o.   MRN: [id number]  Caroline Conley is a 50 y.o. female presenting on 11/18/2016 for Sinusitis (cough onset couple of days but yesterday was worst HA facial pain drainge and cough cause mild sore throat)  Patient presents for a same day appointment.  HPI   ACUTE COPD EXAC / URI SINUSITIS - Reports onset symptoms >3 days ago with URI cold symptoms with worsening sinusitis, congestion, pressure and pain, associated headache worse with cough. Now within past 24 hours worsening breathing with difficulty, wheezing, productive cough, worse at night. Concern for COPD flare. Last flare almost 1 year ago, had been doing very well on current regimen with Advair daily. - Currently using Albuterol q 6 hours for past 1-2 days with relief. Does not have nebulizer, interested in this option - Previous Sinusitis or COPD exac has tolerated Augmentin, Levaquin, tolerates Prednisone well - Request Fluconazole refill, she describes getting mild oral thrush or yeast infection with prolonged use of Advair - Admits productive sputum - Denies chest pain or pressure, dyspnea at rest, fever/chills, sinus purulence  TOBACCO ABUSE: - Chronic history of tobacco abuse >30 years, avg 1ppd. No prior quit successfully. Tried Chantix (failed with nightmares and vivid dreams), Wellbutrin (not effective), NRT gum and patch. Previously had requested NRT nasal spray, was rx 08/2016 but required Prior Auth and this not completed, prior PCP no longer at practice. - Recently with current COPD exac, she has cut back smoking, and is very eager to quit. Her birthday was [birth year] and she is determined to quit around this time, knows the health risks of smoking. - Request repeat order of nicotine nasal spray today   Social History  Substance Use Topics  . Smoking status: Current Every Day Smoker    Packs/day: 1.00    Years: 30.00    Types: Cigarettes    . Smokeless tobacco: Current User  . Alcohol use No    Review of Systems Per HPI unless specifically indicated above     Objective:    BP 126/81   Pulse 100   Temp 98.3 F (36.8 C) (Oral)   Resp 16   Ht 5\' 6"  (1.676 m)   Wt 131 lb (59.4 kg)   SpO2 98%   BMI 21.14 kg/m   Wt Readings from Last 3 Encounters:  11/18/16 131 lb (59.4 kg)  08/29/16 132 lb (59.9 kg)  07/08/16 129 lb (58.5 kg)    Physical Exam  Constitutional: She appears well-developed and well-nourished. No distress.  Well-appearing, comfortable, cooperative  HENT:  Head: Normocephalic and atraumatic.  Frontal sinuses mildly tender bilateral. Nares mostly patent with some slight turbinate edema and congestion without purulence. Bilateral TMs clear without erythema, effusion or bulging. Oropharynx clear without erythema, exudates, edema or asymmetry.  Eyes: Conjunctivae are normal. Right eye exhibits no discharge. Left eye exhibits no discharge.  Neck: Normal range of motion. Neck supple.  Cardiovascular: Regular rhythm, normal heart sounds and intact distal pulses.   No murmur heard. Tachycardic  Pulmonary/Chest: Effort normal. No respiratory distress. She has wheezes (Significant coarse end exp wheezing). She has no rales.  Reduced air movement diffusely with coarse wheezing. Speaks full sentences, without increased respiratory effort.  Musculoskeletal: She exhibits no edema.  Neurological: She is alert.  Skin: Skin is warm and dry. She is not diaphoretic.  Psychiatric: Her behavior is normal.  Nursing note and vitals reviewed.  S/p Duoneb x 1 Nebulizer treatment in office Pulm: Moderately improved air movement, but persistent to increased coarse wheezing diffusely, clinically patients feels breathing is improved.     Assessment & Plan:   Problem List Items Addressed This Visit    Tobacco use    Active smoker, now reduced with current COPD exac No successful prior quit attempts, failed Chantix  (dreams), Wellbutrin, NRT gum, patches  Plan: 1. Re-try NRT Nasal spray, last rx 08/2016 never obtained due to PA. Re-sent rx, awaiting PA. Start 1-2 doses per hour, titrate up as needed, max doses 5 per hour (1 dose = 2 sprays), 40 per day, continue stable dose 8 weeks, then taper off 4-6 weeks. Quit smoking date of start using nasal spray, start anti-histamine, saline flush as well, hold for 1-2 weeks while sinus treated 2. [place] Quitline info given 3. Follow-up 4-8 weeks      Relevant Medications   Nicotine 10 MG/ML SOLN   Oral yeast infection    No yeast infection actively, h/o chronic recurrent oral thrush / yeast secondary to advair steroid inhaler - Preventative rx given for fluconazole use PRN, follow-up as needed      Relevant Medications   fluconazole (DIFLUCAN) 200 MG tablet   COPD (chronic obstructive pulmonary disease) (HCC)    Currently mild AECOPD today, trigger sinusitis, URI. Otherwise, normally well controlled on Advair. Last exac >1 yr ago - No hypoxia (98% on RA), afebrile, no recent hospitalization  Plan: 1. Duoneb x 1 in office today - improved, given neb tubing 2. Order nebulizer machine through [facility], demographics sent, awaiting follow-up 3. Start Prednisone 50mg  x 5 day steroid burst 4. Start Levaquin 500mg  daily x7 days with sinusitis as well 5. Use albuterol q 4 hr regularly x 2-3 days. Continue maintenance advair. 6. Rx Tussionex printed, counseled on caution with codeine cough medicine 7. Smoking cessation, once acute exac resolved, start NRT quit smoking 8. Return criteria given      Relevant Medications   ipratropium-albuterol (DUONEB) 0.5-2.5 (3) MG/3ML nebulizer solution 3 mL   predniSONE (DELTASONE) 50 MG tablet   chlorpheniramine-HYDROcodone (TUSSIONEX PENNKINETIC ER) 10-8 MG/5ML SUER   albuterol (PROVENTIL) (2.5 MG/3ML) 0.083% nebulizer solution   Nicotine 10 MG/ML SOLN   Other Relevant Orders   DME Nebulizer machine   Acute exacerbation of  chronic obstructive pulmonary disease (COPD) (HCC) - Primary    Currently mild AECOPD today, trigger sinusitis, URI. Otherwise, normally well controlled on Advair. Last exac >1 yr ago - No hypoxia (98% on RA), afebrile, no recent hospitalization  Plan: 1. Duoneb x 1 in office today - improved, given neb tubing 2. Order nebulizer machine through [facility], demographics sent, awaiting follow-up 3. Start Prednisone 50mg  x 5 day steroid burst 4. Start Levaquin 500mg  daily x7 days with sinusitis as well 5. Use albuterol q 4 hr regularly x 2-3 days. Continue maintenance advair. 6. Rx Tussionex printed, counseled on caution with codeine cough medicine 7. Smoking cessation, once acute exac resolved, start NRT quit smoking 8. Return criteria given      Relevant Medications   ipratropium-albuterol (DUONEB) 0.5-2.5 (3) MG/3ML nebulizer solution 3 mL   levofloxacin (LEVAQUIN) 500 MG tablet   predniSONE (DELTASONE) 50 MG tablet   chlorpheniramine-HYDROcodone (TUSSIONEX PENNKINETIC ER) 10-8 MG/5ML SUER   albuterol (PROVENTIL) (2.5 MG/3ML) 0.083% nebulizer solution   Nicotine 10 MG/ML SOLN    Other Visit Diagnoses    Acute non-recurrent frontal sinusitis       Underlying trigger to  acute COPD. Treat with Levaquin. See A&P   Relevant Medications   fluconazole (DIFLUCAN) 200 MG tablet   levofloxacin (LEVAQUIN) 500 MG tablet   predniSONE (DELTASONE) 50 MG tablet   chlorpheniramine-HYDROcodone (TUSSIONEX PENNKINETIC ER) 10-8 MG/5ML SUER   Tobacco abuse counseling       Discussion today >5 minutes specifically on counseling on risks of tobacco use, complications, treatment, smoking cessation.   Encounter for smoking cessation counseling       Relevant Medications   Nicotine 10 MG/ML SOLN      Meds ordered this encounter  Medications  . DISCONTD: fluconazole (DIFLUCAN) 200 MG tablet    Sig: Take 200 mg by mouth once. Pt has 5 tablets can be taken once as needed  . fluconazole (DIFLUCAN) 200 MG  tablet    Sig: Take 1 tablet (200 mg total) by mouth once as needed. For yeast infection, may repeat dose in 48 hours.    Dispense:  5 tablet    Refill:  1  . ipratropium-albuterol (DUONEB) 0.5-2.5 (3) MG/3ML nebulizer solution 3 mL  . levofloxacin (LEVAQUIN) 500 MG tablet    Sig: Take 1 tablet (500 mg total) by mouth daily.    Dispense:  7 tablet    Refill:  0  . predniSONE (DELTASONE) 50 MG tablet    Sig: Take 1 tablet (50 mg total) by mouth daily with breakfast.    Dispense:  5 tablet    Refill:  0  . chlorpheniramine-HYDROcodone (TUSSIONEX PENNKINETIC ER) 10-8 MG/5ML SUER    Sig: Take 5 mLs by mouth every 12 (twelve) hours as needed for cough.    Dispense:  115 mL    Refill:  0  . albuterol (PROVENTIL) (2.5 MG/3ML) 0.083% nebulizer solution    Sig: Take 3 mLs (2.5 mg total) by nebulization every 6 (six) hours as needed for wheezing or shortness of breath.    Dispense:  150 mL    Refill:  5  . Nicotine 10 MG/ML SOLN    Sig: 1-2 doses per hour initially, increase as needed, max dose 5 per hour / 40 per day. 1 dose = two 0.5mg  sprays.    Dispense:  10 mL    Refill:  1      Follow up plan: Return in about 6 weeks (around 12/30/2016) for smoking cessation, COPD.  [name], [facility] 11/18/2016, 12:08 PM

## 2016-11-18 NOTE — Patient Instructions (Addendum)
Thank you for coming in to clinic today.  1. It sounds like you had an Upper Respiratory Virus / Sinus infection that has moved deeper into a COPD Exacerbation with wheezing. - Start Levaquin 500mg  daily for 7 days - Start Prednisone 50mg  daily for next 5 days - this will open up lungs allow you to breath better and treat that wheezing or bronchospasm - Continue Advair daily - Use Albuterol inhaler 2 puffs every 4-6 hours around the clock for next 2-3 days, max up to 5 days then use as needed - Use Tussionex as needed for cough - Ordered Nebulizer machine through [facility], they will contact you further, I have ordered albuterol solution and given tubing for this as well. If you can get the nebulizer, use this only as needed usually if inhaler is not helping, and you are having a flare up. Otherwise try to keep using inhaler instead.  Contact info if needed for [facility] [address] [place], [place] [address] Ph: [phone], [phone]  May use Loratadine / Cetirizine regularly for sinus and allergy decongestant as well, can use up to 4-6 weeks - Use nasal saline (Simply Saline or Ocean Spray) to flush nasal congestion multiple times a day, may help cough - Drink plenty of fluids to improve congestion  If your symptoms seem to worsen instead of improve over next several days, including significant fever / chills, worsening shortness of breath, worsening wheezing, or nausea / vomiting and can't take medicines - return sooner or go to hospital Emergency Department for more immediate treatment.  For quitting smoking, we can try the nicotine nasal spray, caution though using this medicine can irritate sinuses, so I would not use it for at least 1 week until feeling better. And I would certainly start an allergy pill such as cetirizine or loratadine everyday, and use simply saline nasal saline flush regularly to avoid irritation from the nicotine nasal spray, maybe use saline  flush twice a day NOT right after using nicotine  START Nicotine inhaler 1-2 weeks after current infection resolves, give [place] time for ins authorization.  PICK QUIT DATE, stop smoking and start nasal spray. You cannot smoke while using nasal spray.  Nicotine Nasal Spray Dosing: Brand Dosage Duration  Nicotrol NS  (10mg /ml) Rx One or two 1mg  doses per hour initially; (each dose is two 0.5mg  sprays, one in each nostril) increased as needed, not to exceed 5 doses per hour or 40 doses per day Use up to 8 weeks then gradually decrease over 4-6 weeks   Prescribing Instructions: . Avoid use in:  Asthma/ Chronic Nasal Disorders: (i.e. allergy, rhinitis, nasal polyps and sinusitis). . No smoking while using the nasal spray. . Remove cap.  Press in circles on sides of bottle and pull off cap. . Prime pump before first use only.  Get a tissue or paper towel.  Press up on bottom with thumb. Pump into tissue until you see a spray (6-8 times) . Blow nose if it is not clear.   . Tilt head back slightly (approx. 30 degrees) . Insert nasal spray tip into nostril as far as comfortable. . Breathe only through mouth.  Spray once in each nostril.  Do not sniff or inhale while spraying.  If nose runs, gently sniff to keep nasal spray in nose. Wait 2 or 3 minutes before blowing nose.  . Typical side effects include: Nasal irritation, runny nose, throat irritation, watering eyes, sneezing, and cough (initial effects).  Please schedule a follow-up appointment with Dr. Parks Ranger in 4 to 8 weeks for Smoking Cessation, COPD follow-up  If you have any other questions or concerns, please feel free to call the clinic or send a message through [facility]. You may also schedule an earlier appointment if necessary.  Caroline Putnam, DO [place]

## 2016-11-18 NOTE — Assessment & Plan Note (Addendum)
Currently mild AECOPD today, trigger sinusitis, URI. Otherwise, normally well controlled on Advair. Last exac >1 yr ago - No hypoxia (98% on RA), afebrile, no recent hospitalization  Plan: 1. Duoneb x 1 in office today - improved, given neb tubing 2. Order nebulizer machine through [place], demographics sent, awaiting follow-up 3. Start Prednisone 50mg  x 5 day steroid burst 4. Start Levaquin 500mg  daily x7 days with sinusitis as well 5. Use albuterol q 4 hr regularly x 2-3 days. Continue maintenance advair. 6. Rx Tussionex printed, counseled on caution with codeine cough medicine 7. Smoking cessation, once acute exac resolved, start NRT quit smoking 8. Return criteria given

## 2016-12-05 ENCOUNTER — Encounter: Payer: Self-pay | Admitting: Family Medicine

## 2016-12-05 ENCOUNTER — Ambulatory Visit (INDEPENDENT_AMBULATORY_CARE_PROVIDER_SITE_OTHER): Payer: Medicaid Other | Admitting: Family Medicine

## 2016-12-05 VITALS — BP 124/84 | HR 94 | Temp 98.2°F | Resp 16 | Ht 66.0 in | Wt 131.0 lb

## 2016-12-05 DIAGNOSIS — J441 Chronic obstructive pulmonary disease with (acute) exacerbation: Secondary | ICD-10-CM | POA: Diagnosis not present

## 2016-12-05 DIAGNOSIS — Z720 Tobacco use: Secondary | ICD-10-CM

## 2016-12-05 MED ORDER — LEVOFLOXACIN 750 MG PO TABS: 750.0000 mg | ORAL_TABLET | Freq: Every day | ORAL | 0 refills | Status: DC

## 2016-12-05 MED ORDER — PREDNISONE 50 MG PO TABS: 50.0000 mg | ORAL_TABLET | Freq: Every day | ORAL | 0 refills | Status: DC

## 2016-12-05 MED ORDER — HYDROCOD POLST-CPM POLST ER 10-8 MG/5ML PO SUER: 5.0000 mL | Freq: Two times a day (BID) | ORAL | 0 refills | Status: DC | PRN

## 2016-12-05 NOTE — Progress Notes (Signed)
Subjective:    Patient ID: Caroline Conley, female    DOB: 1966/09/27, 50 y.o.   MRN: [id number]  Caroline Conley is a 50 y.o. female presenting on 12/05/2016 for Nasal Congestion (facial pain yellowish mucus wheezing chest congestion no chills or fever)  HPI   ACUTE COPD EXAC / URI SINUSITIS - Last visit to [facility] 11/18/16 for same problem, with URI sinusitis/bronchitis symptoms with significant wheezing consistent with acute COPD exacerbation with worsening, increased albuterol use. Treated with Levaquin 500mg  x 7 days, Prednisone 50mg  x 5 days. - Today she reports early recurrence of same symptoms, started in past 3 days with gradual worsening with some sinus pressure, congestion, drainage, wheezing, productive cough and shortness of breath. She was nearly 100% improved after the prior antibiotic / prednisone treatment after 7 days, and remains well for nearly 1 week after treatment, but then symptoms started to return, attributes this to several sick contacts at home with granddaughters, and baby all with similar sinus/URI/ear infection. - Received nebulizer machine from [facility], has all supplies and medicine she needs, this was covered by ins - Also she has reduced her smoking to 0.5ppd since sick, see below - Improved on Tussionex, request refill, using Advair and Albuterol - Denies chest pain or pressure, dyspnea at rest, fever/chills, ear pain, sore throat  TOBACCO ABUSE: - Chronic history of tobacco abuse >30 years, avg 1ppd. No prior quit successfully. Tried Chantix (failed with nightmares and vivid dreams), Wellbutrin (not effective), NRT gum and patch.  - Today reports down to 0.5ppd daily, she has followed cessation advice last time and rationed out cigarettes planned fixed number per day and adheres to this schedule, breathing feels better. She is eager to continue to slowly wean down on # of cigarettes. - She has not called quitline yet, but will try this - Unable to get nicotine  nasal spray covered by ins, but will check on cost of this - She is also interested in low dose Chest CT Lung CA screening  Social History  Substance Use Topics  . Smoking status: Current Every Day Smoker    Packs/day: 1.00    Years: 30.00    Types: Cigarettes  . Smokeless tobacco: Current User  . Alcohol use No    Review of Systems Per HPI unless specifically indicated above     Objective:    BP 124/84   Pulse 94   Temp 98.2 F (36.8 C) (Oral)   Resp 16   Ht 5\' 6"  (1.676 m)   Wt 131 lb (59.4 kg)   SpO2 98%   BMI 21.14 kg/m   Wt Readings from Last 3 Encounters:  12/05/16 131 lb (59.4 kg)  11/18/16 131 lb (59.4 kg)  08/29/16 132 lb (59.9 kg)    Physical Exam  Constitutional: She appears well-developed and well-nourished. No distress.  Well-appearing, comfortable, cooperative  HENT:  Head: Normocephalic and atraumatic.  Frontal sinuses non-tender, now bilateral maxillary sinuses mild tender. Nares mostly patent with some stable mild turbinate edema and congestion without purulence. Bilateral TMs clear without erythema, effusion or bulging. Oropharynx clear without erythema, exudates, edema or asymmetry.  Eyes: Conjunctivae are normal. Right eye exhibits no discharge. Left eye exhibits no discharge.  Neck: Normal range of motion. Neck supple.  Cardiovascular: Regular rhythm, normal heart sounds and intact distal pulses.   No murmur heard. Tachycardic  Pulmonary/Chest: Effort normal. No respiratory distress. She has wheezes (Significant coarse end exp wheezing).  Speaks full sentences, without increased  respiratory effort. Mild reduced air movement diffusely with exp wheezing.  Musculoskeletal: She exhibits no edema.  Lymphadenopathy:    She has no cervical adenopathy.  Neurological: She is alert.  Skin: Skin is warm and dry. She is not diaphoretic.  Psychiatric: Her behavior is normal.  Nursing note and vitals reviewed.     Assessment & Plan:   Problem List  Items Addressed This Visit    Tobacco use    Active smoker, still tapering down on own from 1ppd to 0.5ppd with determined taper plan, improved now with persistent COPD exac she has had less desire to smoke - Prior failed quit attempts (chantix, wellbutrin, NRT gum, patches)  Plan: 1. Continue self taper wean, smoking cessation reviewed 2. Hold NRT, not covered for nicotine nasal spray 3. Call QUITLINE for support, and may be able to get free samples or other NRT 4. Requested order for Low Dose Chest CT [place] screening, now with medicaid coverage by report, however patient is only age 30, but has 30 pack year smoking history, reviewed criteria for screening and starting age is 48+, I contacted [place] who organizes CT screening to verify if she would qualify or not, otherwise she will need to wait until age 68 before first CT screening, we could consider chest x-ray in future if requested. Will place order for CT in future if indicated and covered. 5. Follow-up smoking cessation in future      Acute exacerbation of chronic obstructive pulmonary disease (COPD) ([place]) - Primary    Recurrent sinusitis with mild AECOPD today, after previously resolved from antibiotic/prednisone treatment for about 10+ days, however multiple sick contacts at home, suspect re-infection and patient presenting earlier today to resolve problem before worsening COPD. - No hypoxia (98% on RA), afebrile, no recent hospitalization - Has nebulizer at home  Plan: 1. Start repeat antibiotic - Levaquin 750mg  x 5 days 2. Start Prednisone 50mg  x 5 day steroid burst 3. Use albuterol q 4 hr regularly x 2-3 days. Continue maintenance advair. 4. Refill Rx Tussionex printed, counseled on caution with codeine cough medicine 5. Start Loratadine, Flonase 4-6 weeks prevent recurrence 6. Reviewed smoking cessation, continue slowly wean down, now 0.5ppd 7. Return criteria given - Advised caution with likely no further repeat  antibiotics steroids for now, would give more time to resolve after this treatment      Relevant Medications   predniSONE (DELTASONE) 50 MG tablet   levofloxacin (LEVAQUIN) 750 MG tablet   chlorpheniramine-HYDROcodone (TUSSIONEX PENNKINETIC ER) 10-8 MG/5ML SUER      Meds ordered this encounter  Medications  . predniSONE (DELTASONE) 50 MG tablet    Sig: Take 1 tablet (50 mg total) by mouth daily with breakfast.    Dispense:  5 tablet    Refill:  0  . levofloxacin (LEVAQUIN) 750 MG tablet    Sig: Take 1 tablet (750 mg total) by mouth daily. For 5 days    Dispense:  5 tablet    Refill:  0  . chlorpheniramine-HYDROcodone (TUSSIONEX PENNKINETIC ER) 10-8 MG/5ML SUER    Sig: Take 5 mLs by mouth every 12 (twelve) hours as needed for cough.    Dispense:  115 mL    Refill:  0      Follow up plan: Return in about 2 months (around 02/05/2017), or if symptoms worsen or fail to improve, for smoking COPD.  [name], DO [facility] 12/05/2016, 11:03 AM

## 2016-12-05 NOTE — Assessment & Plan Note (Signed)
Recurrent sinusitis with mild AECOPD today, after previously resolved from antibiotic/prednisone treatment for about 10+ days, however multiple sick contacts at home, suspect re-infection and patient presenting earlier today to resolve problem before worsening COPD. - No hypoxia (98% on RA), afebrile, no recent hospitalization - Has nebulizer at home  Plan: 1. Start repeat antibiotic - Levaquin 750mg  x 5 days 2. Start Prednisone 50mg  x 5 day steroid burst 3. Use albuterol q 4 hr regularly x 2-3 days. Continue maintenance advair. 4. Refill Rx Tussionex printed, counseled on caution with codeine cough medicine 5. Start Loratadine, Flonase 4-6 weeks prevent recurrence 6. Reviewed smoking cessation, continue slowly wean down, now 0.5ppd 7. Return criteria given - Advised caution with likely no further repeat antibiotics steroids for now, would give more time to resolve after this treatment

## 2016-12-05 NOTE — Patient Instructions (Signed)
Thank you for coming in to clinic today.  1. It sounds like you had an Upper Respiratory Virus / Sinus infection that has moved deeper into a COPD Exacerbation with wheezing. - Start Levaquin 750mg  daily for 5 days - Start Prednisone 50mg  daily for next 5 days - this will open up lungs allow you to breath better and treat that wheezing or bronchospasm - Continue Advair daily - Use Albuterol inhaler 2 puffs every 4-6 hours around the clock for next 2-3 days, max up to 5 days then use as needed - Use Tussionex as needed for cough  May use Loratadine / Cetirizine and Flonase 2 sprays in each nostril daily regularly for sinus and allergy decongestant as well, can use up to 4-6 weeks - Use nasal saline (Simply Saline or Ocean Spray) to flush nasal congestion multiple times a day, may help cough - Drink plenty of fluids to improve congestion  1 [id number] NOW  If your symptoms seem to worsen instead of improve over next several days, including significant fever / chills, worsening shortness of breath, worsening wheezing, or nausea / vomiting and can't take medicines - return sooner or go to hospital Emergency Department for more immediate treatment.  Please schedule a follow-up appointment with Dr. Parks Ranger in 4 and 8 weeks for Smoking Cessation, COPD follow-up  If you have any other questions or concerns, please feel free to call the clinic or send a message through [place]. You may also schedule an earlier appointment if necessary.  [name], DO [place]

## 2016-12-05 NOTE — Assessment & Plan Note (Addendum)
Active smoker, still tapering down on own from 1ppd to 0.5ppd with determined taper plan, improved now with persistent COPD exac she has had less desire to smoke - Prior failed quit attempts (chantix, wellbutrin, NRT gum, patches)  Plan: 1. Continue self taper wean, smoking cessation reviewed 2. Hold NRT, not covered for nicotine nasal spray 3. Call QUITLINE for support, and may be able to get free samples or other NRT 4. Requested order for Low Dose Chest CT [place] screening, now with medicaid coverage by report, however patient is only age 50, but has 30 pack year smoking history, reviewed criteria for screening and starting age is 5+, I contacted [place] who organizes CT screening to verify if she would qualify or not, otherwise she will need to wait until age 78 before first CT screening, we could consider chest x-ray in future if requested. Will place order for CT in future if indicated and covered. 5. Follow-up smoking cessation in future

## 2017-01-22 ENCOUNTER — Telehealth: Payer: Self-pay | Admitting: Podiatry

## 2017-01-22 NOTE — Telephone Encounter (Signed)
Patient called and left me a voicemail yesterday requesting a CD of her X-rays and copies of her notes from her surgery she had about six months ago.

## 2017-01-22 NOTE — Telephone Encounter (Signed)
Jazmine can you please call the patient back at [phone] and help her with her medical records request since she is a [place] patient. Thank you.

## 2017-02-13 ENCOUNTER — Ambulatory Visit: Payer: Medicaid Other | Admitting: Podiatry

## 2017-02-18 ENCOUNTER — Ambulatory Visit: Payer: Medicaid Other | Admitting: Podiatry

## 2017-02-19 ENCOUNTER — Telehealth: Payer: Self-pay | Admitting: *Deleted

## 2017-02-19 NOTE — Telephone Encounter (Signed)
Called patient and stated to [facility] who answered the patient's phone to have the patient give me a call back. [place] Mon

## 2017-03-09 ENCOUNTER — Emergency Department
Admission: EM | Admit: 2017-03-09 | Discharge: 2017-03-09 | Disposition: A | Discharge status: Home / Self Care | Payer: Medicaid Other | Attending: Emergency Medicine | Admitting: Emergency Medicine

## 2017-03-09 ENCOUNTER — Encounter: Payer: Self-pay | Admitting: Emergency Medicine

## 2017-03-09 DIAGNOSIS — J449 Chronic obstructive pulmonary disease, unspecified: Secondary | ICD-10-CM | POA: Insufficient documentation

## 2017-03-09 DIAGNOSIS — W268XXA Contact with other sharp object(s), not elsewhere classified, initial encounter: Secondary | ICD-10-CM | POA: Diagnosis not present

## 2017-03-09 DIAGNOSIS — Y929 Unspecified place or not applicable: Secondary | ICD-10-CM | POA: Insufficient documentation

## 2017-03-09 DIAGNOSIS — Y9389 Activity, other specified: Secondary | ICD-10-CM | POA: Diagnosis not present

## 2017-03-09 DIAGNOSIS — Y999 Unspecified external cause status: Secondary | ICD-10-CM | POA: Diagnosis not present

## 2017-03-09 DIAGNOSIS — F17210 Nicotine dependence, cigarettes, uncomplicated: Secondary | ICD-10-CM | POA: Diagnosis not present

## 2017-03-09 DIAGNOSIS — S61213A Laceration without foreign body of left middle finger without damage to nail, initial encounter: Secondary | ICD-10-CM | POA: Diagnosis not present

## 2017-03-09 DIAGNOSIS — Z23 Encounter for immunization: Secondary | ICD-10-CM | POA: Diagnosis not present

## 2017-03-09 MED ORDER — TETANUS-DIPHTH-ACELL PERTUSSIS 5-2.5-18.5 LF-MCG/0.5 IM SUSP
0.5000 mL | Freq: Once | INTRAMUSCULAR | Status: AC
  Administered 2017-03-09: 0.5 mL via INTRAMUSCULAR
  Filled 2017-03-09: qty 0.5

## 2017-03-09 MED ORDER — TETANUS-DIPHTHERIA TOXOIDS TD 5-2 LFU IM INJ: 0.5000 mL | INJECTION | Freq: Once | INTRAMUSCULAR | Status: DC

## 2017-03-09 NOTE — ED Triage Notes (Signed)
Pt arrives ambulatory to triage with c/o of left middle finger laceration. Pt was cutting onions for dinner and the onion slipped. Pt is in NAD at this time.

## 2017-03-09 NOTE — Discharge Instructions (Signed)
Sprain finger splint for 2-3 days days as needed

## 2017-03-09 NOTE — ED Provider Notes (Signed)
[facility] Emergency Department Provider Note   ____________________________________________   First MD Initiated Contact with Patient 03/09/17 2038     (approximate)  I have reviewed the triage vital signs and the nursing notes.   HISTORY  Chief Complaint Extremity Laceration    HPI Caroline Conley is a 51 y.o. female patient complaining of laceration to the third digit left hand. Patient was cutting onions and the nystatin lacerating her finger. Patient state bleeding is controlled on with direct pressure. Patient denies loss of sensation or loss of function of the finger. Patient is right-hand dominant. Patient rates pain as a 5/10. Patient described a pain as "achy". Patient tetanus shot is not up-to-date.   Past Medical History:  Diagnosis Date  . Cancer ([place]) 01/2016   basal cell   . Wears dentures    full upper and lower    Patient Active Problem List   Diagnosis Date Noted  . Acute exacerbation of chronic obstructive pulmonary disease (COPD) ([place]) 11/18/2016  . Oral yeast infection 11/18/2016  . HAV (hallux abducto valgus) 08/02/2016  . Special screening for malignant neoplasms, colon   . COPD (chronic obstructive pulmonary disease) ([place]) 06/07/2016  . Angular cheilitis 06/07/2016  . Tobacco use 06/07/2016  . 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 Acid indigestion 09/07/2014    Past Surgical History:  Procedure Laterality Date  . ABDOMINAL HYSTERECTOMY    . APPENDECTOMY    . BACK SURGERY     this will make third time  . CARDIAC CATHETERIZATION  09/20/14   [facility] - Dr. Clayborn Bigness  . COLONOSCOPY WITH PROPOFOL N/A 07/08/2016   Procedure: COLONOSCOPY WITH PROPOFOL;  Surgeon: Lucilla Lame, MD;  Location: [place];  Service: Endoscopy;  Laterality: N/A;  . DEBRIDEMENT TENNIS ELBOW    . METATARSAL OSTEOTOMY  05/18/2013  . ROTATOR CUFF REPAIR    . TONSILLECTOMY      Prior to Admission medications   Medication Sig  Start Date End Date Taking? Authorizing Provider  albuterol (PROAIR HFA) 108 (90 Base) MCG/ACT inhaler Inhale 2 puffs into the lungs every 6 (six) hours as needed for wheezing or shortness of breath. 06/07/16   Amy Overton Mam, NP  albuterol (PROVENTIL) (2.5 MG/3ML) 0.083% nebulizer solution Take 3 mLs (2.5 mg total) by nebulization every 6 (six) hours as needed for wheezing or shortness of breath. 11/18/16   Olin Hauser, DO  chlorpheniramine-HYDROcodone (TUSSIONEX PENNKINETIC ER) 10-8 MG/5ML SUER Take 5 mLs by mouth every 12 (twelve) hours as needed for cough. 12/05/16   Olin Hauser, DO  fluconazole (DIFLUCAN) 200 MG tablet Take 1 tablet (200 mg total) by mouth once as needed. For yeast infection, may repeat dose in 48 hours. 11/18/16   Olin Hauser, DO  Fluticasone-Salmeterol (ADVAIR) 250-50 MCG/DOSE AEPB Inhale 1 puff into the lungs 2 (two) times daily. 06/07/16   Amy Overton Mam, NP  levofloxacin (LEVAQUIN) 750 MG tablet Take 1 tablet (750 mg total) by mouth daily. For 5 days 12/05/16   Olin Hauser, DO  Nicotine 10 MG/ML SOLN 1-2 doses per hour initially, increase as needed, max dose 5 per hour / 40 per day. 1 dose = two 0.5mg  sprays. 11/18/16   Olin Hauser, DO  predniSONE (DELTASONE) 50 MG tablet Take 1 tablet (50 mg total) by mouth daily with breakfast. 12/05/16   Olin Hauser, DO    Allergies   Family History  Problem Relation Age of Onset  .  Diabetes Mother   . High blood pressure Mother   . Gout Mother   . Cancer Father   . Stroke Father   . Arthritis/Rheumatoid Father   . Diabetes Father   . High blood pressure Father     Social History Social History  Substance Use Topics  . Smoking status: Current Every Day Smoker    Packs/day: 1.00    Years: 30.00    Types: Cigarettes  . Smokeless tobacco: Current User  . Alcohol use No    Review of Systems Constitutional: No fever/chills Eyes: No visual  changes. ENT: No sore throat. Cardiovascular: Denies chest pain. Respiratory: Denies shortness of breath. Gastrointestinal: No abdominal pain.  No nausea, no vomiting.  No diarrhea.  No constipation. Genitourinary: Negative for dysuria. Musculoskeletal: Negative for back pain. Skin: Negative for rash. Laceration to the third digit left hand Neurological: Negative for headaches, focal weakness or numbness.    ____________________________________________   PHYSICAL EXAM:  VITAL SIGNS: ED Triage Vitals  Enc Vitals Group     BP --      Pulse --      Resp --      Temp --      Temp src --      SpO2 --      Weight 03/09/17 2025 125 lb (56.7 kg)     Height 03/09/17 2025 5\' 6"  (1.676 m)     Head Circumference --      Peak Flow --      Pain Score 03/09/17 2026 5     Pain Loc --      Pain Edu? --      Excl. in [place]? --     Constitutional: Alert and oriented. Well appearing and in no acute distress. Eyes: Conjunctivae are normal. PERRL. EOMI. Head: Atraumatic. Nose: No congestion/rhinnorhea. Mouth/Throat: Mucous membranes are moist.  Oropharynx non-erythematous. Neck: No stridor.  No cervical spine tenderness to palpation. Hematological/Lymphatic/Immunilogical: No cervical lymphadenopathy. Cardiovascular: Normal rate, regular rhythm. Grossly normal heart sounds.  Good peripheral circulation. Respiratory: Normal respiratory effort.  No retractions. Lungs CTAB. Gastrointestinal: Soft and nontender. No distention. No abdominal bruits. No CVA tenderness. Musculoskeletal: No lower extremity tenderness nor edema.  No joint effusions. Neurologic:  Normal speech and language. No gross focal neurologic deficits are appreciated. No gait instability. Skin:  Skin is warm, dry and intact. No rash noted. 0.5 cm laceration third digit left hand. Psychiatric: Mood and affect are normal. Speech and behavior are normal.  ____________________________________________   LABS (all labs ordered are  listed, but only abnormal results are displayed)  Labs Reviewed - No data to display ____________________________________________  EKG   ____________________________________________  RADIOLOGY   ____________________________________________   PROCEDURES  Procedure(s) performed: LACERATION REPAIR Performed by: Sable Feil Authorized by: Sable Feil Consent: Verbal consent obtained. Risks and benefits: risks, benefits and alternatives were discussed Consent given by: patient Patient identity confirmed: provided demographic data Prepped and Draped in normal sterile fashion Wound explored  Laceration Location: Third digit left hand  Laceration Length: 0.5cm Irrigation method: syringe Amount of cleaning: standard Skin closure: Dermabond Patient tolerance: Patient tolerated the procedure well with no immediate complications.   Procedures  Critical Care performed: No  ____________________________________________   INITIAL IMPRESSION / ASSESSMENT AND PLAN / ED COURSE  Pertinent labs & imaging results that were available during my care of the patient were reviewed by me and considered in my medical decision making (see chart for details).  Laceration third digit  left hand. Patient given discharge care instructions. Patient given tetanus shot prior to departure.      ____________________________________________   FINAL CLINICAL IMPRESSION(S) / ED DIAGNOSES  Final diagnoses:  Laceration of left middle finger without foreign body without damage to nail, initial encounter      NEW MEDICATIONS STARTED DURING THIS VISIT:  New Prescriptions   No medications on file     Note:  This document was prepared using Dragon voice recognition software and may include unintentional dictation errors.    Sable Feil, PA-C 03/09/17 2044    Carrie Mew, MD 03/09/17 [phone]

## 2017-03-24 ENCOUNTER — Ambulatory Visit: Payer: Medicaid Other | Admitting: Family Medicine

## 2017-03-25 ENCOUNTER — Ambulatory Visit: Payer: Medicaid Other | Admitting: Family Medicine

## 2017-03-26 ENCOUNTER — Ambulatory Visit: Payer: Medicaid Other | Admitting: Family Medicine

## 2017-03-27 ENCOUNTER — Encounter: Payer: Self-pay | Admitting: Family Medicine

## 2017-03-27 ENCOUNTER — Ambulatory Visit (INDEPENDENT_AMBULATORY_CARE_PROVIDER_SITE_OTHER): Payer: Medicaid Other | Admitting: Family Medicine

## 2017-03-27 VITALS — BP 118/72 | HR 83 | Temp 98.3°F | Resp 16 | Ht 66.0 in | Wt 134.0 lb

## 2017-03-27 DIAGNOSIS — J410 Simple chronic bronchitis: Secondary | ICD-10-CM | POA: Diagnosis not present

## 2017-03-27 DIAGNOSIS — B370 Candidal stomatitis: Secondary | ICD-10-CM

## 2017-03-27 DIAGNOSIS — Z716 Tobacco abuse counseling: Secondary | ICD-10-CM

## 2017-03-27 DIAGNOSIS — Z720 Tobacco use: Secondary | ICD-10-CM | POA: Diagnosis not present

## 2017-03-27 MED ORDER — FLUCONAZOLE 200 MG PO TABS: 200.0000 mg | ORAL_TABLET | Freq: Once | ORAL | 1 refills | Status: DC | PRN

## 2017-03-27 MED ORDER — BUPROPION HCL ER (SR) 150 MG PO TB12: ORAL_TABLET | ORAL | 2 refills | Status: DC

## 2017-03-27 NOTE — Assessment & Plan Note (Signed)
Currently not active, has had flare of oral thrush due to inhaled steroid with Advair. - Appropriately rinsing mouth out with water after each use - Refill rx Diflucan for PRN use in future

## 2017-03-27 NOTE — Assessment & Plan Note (Addendum)
Active smoker, now ready to quit. Down to 0.5ppd daily. - Complicated by COPD - Prior failed quit attempts (chantix, wellbutrin, NRT gum, patches) - Awaiting age >58 for Low Dose Lung CT screen  Plan: 1. Start Wellbutrin SR 150mg  - start daily in AM for 3 days, then BID (8 hrs after 1st dose) avoid late afternoon/evening dosing to reduce insomnia / dry mouth. Given 1 month supply. Set quit date within 1-2 weeks, can continue smoking for now. Counseled on risks/benefits side effects of medication including mood disruptions and resulting behavior, advised to stop if any significant mood side effect. 2. Follow-up 4-6 weeks, monitor progress, if successful can continue up to max 12 weeks, if no improvement after 7 weeks will discontinue.  Discussion today >10 minutes specifically on counseling on risks of tobacco use, complications, treatment, smoking cessation.

## 2017-03-27 NOTE — Assessment & Plan Note (Signed)
Stable, without exacerbation currently. Does endorse some occasional dyspnea on exertion, has not had oximetry testing. Last exacerbation 11/2016 Controlled on Advair, with PRN Albuterol inhaler/neb Not established with Pulmonology - 97% on RA  Plan: 1. Referral to [facility] Pulmonology for evaluation of COPD and patient requested testing for supplemental oxygen - 6 min walk test, and likely need overnight oximetry 2. Continue current dose Advair, Albuterol PRN 3. Smoking cessation - plans to quit now, see A&P 4. Follow-up

## 2017-03-27 NOTE — Patient Instructions (Signed)
Thank you for coming in to clinic today.  1.  Referral to [facility] for 6 minute walk and determine if qualify for supplemental oxygen, also recommend night-time oximetry testing.  If you don't hear back in next few weeks, you can reach out to [place] to confirm your appointment.  Congratulations on getting ready to quit smoking. See separate handout on Wellbutrin. Pick quit date in 1-2 weeks after start taking med. Can continue for up to 3 months total.  Please schedule a follow-up appointment with Dr. Parks Ranger in 6 weeks to 3 months for smoking cessation, COPD  If you have any other questions or concerns, please feel free to call the clinic or send a message through [place]. You may also schedule an earlier appointment if necessary.  [name], DO [place]

## 2017-03-27 NOTE — Progress Notes (Signed)
Subjective:    Patient ID: Caroline Conley, female    DOB: 03/18/1966, 51 y.o.   MRN: [id number]  Caroline Conley is a 51 y.o. female presenting on 03/27/2017 for Shortness of Breath (onset month )  HPI   FOLLOW-UP COPD - Has been seen few times in 11/2016 for acute COPD flares, last treated with Levaquin and Prednisone burst. Here now for routine follow-up without flare - Today reports doing well overall, she is concerned about the possibility of requiring supplemental oxygen at some point, and would like to get tested to see if needs any oxygen at home during activity or at night-time. She is not established with a Pulmonologist. - Currently taking Advair 1 puff twice daily and Albuterol PRN - Admits still to occasional oral thrush secondary to Advair, and she does rinse her mouth out regularly after each inhaler use, requesting refill on Diflucan only for PRN use - Has nebulizer machine as well, uses PRN with good results - Admits some dyspnea on exertion occasionally - Denies chest pain or pressure, dyspnea at rest, fever/chills  TOBACCO ABUSE: - Chronic history of tobacco abuse >30 years, avg 1ppd. No prior quit successfully. Tried Chantix (failed with nightmares and vivid dreams), Wellbutrin (initially effective but she stopped therapy early in past), NRT gum and patch, also NRT nasal spray not covered by ins. - Today reports still at same amount 0.5ppd but is eager to quit smoking, and would like to try Wellbutrin again, she was unsuccessful on continuing to wean off cigarettes on her own  Social History  Substance Use Topics  . Smoking status: Current Every Day Smoker    Packs/day: 1.00    Years: 30.00    Types: Cigarettes  . Smokeless tobacco: Current User  . Alcohol use No    Review of Systems Per HPI unless specifically indicated above     Objective:    BP 118/72 (BP Location: Left Arm, Cuff Size: Normal)   Pulse 83   Temp 98.3 F (36.8 C) (Oral)   Resp 16   Ht 5'  6" (1.676 m)   Wt 134 lb (60.8 kg)   SpO2 97%   BMI 21.63 kg/m   Wt Readings from Last 3 Encounters:  03/27/17 134 lb (60.8 kg)  03/09/17 125 lb (56.7 kg)  12/05/16 131 lb (59.4 kg)    Physical Exam  Constitutional: She appears well-developed and well-nourished. No distress.  Well-appearing, comfortable, cooperative  HENT:  Head: Normocephalic and atraumatic.  Nares patent without congestion or edema. Oropharynx clear without erythema, exudates, edema or asymmetry.  Eyes: Conjunctivae are normal. Right eye exhibits no discharge. Left eye exhibits no discharge.  Neck: Normal range of motion. Neck supple. No thyromegaly present.  Cardiovascular: Normal rate, regular rhythm, normal heart sounds and intact distal pulses.   No murmur heard. Pulmonary/Chest: Effort normal. No respiratory distress. She has no wheezes. She has no rales.  Speaks full sentences.  Good air movement (significantly improved since last exacerbation). Some very mild coarse bilateral lower breath sounds non focal. No overt wheezing.  Lymphadenopathy:    She has no cervical adenopathy.  Neurological: She is alert.  Skin: Skin is warm and dry. She is not diaphoretic.  Psychiatric: Her behavior is normal.  Nursing note and vitals reviewed.     Assessment & Plan:   Problem List Items Addressed This Visit    Tobacco abuse    Active smoker, now ready to quit. Down to 0.5ppd daily. - Complicated  by COPD - Prior failed quit attempts (chantix, wellbutrin, NRT gum, patches) - Awaiting age >32 for Low Dose Lung CT screen  Plan: 1. Start Wellbutrin SR 150mg  - start daily in AM for 3 days, then BID (8 hrs after 1st dose) avoid late afternoon/evening dosing to reduce insomnia / dry mouth. Given 1 month supply. Set quit date within 1-2 weeks, can continue smoking for now. Counseled on risks/benefits side effects of medication including mood disruptions and resulting behavior, advised to stop if any significant mood side  effect. 2. Follow-up 4-6 weeks, monitor progress, if successful can continue up to max 12 weeks, if no improvement after 7 weeks will discontinue.  Discussion today >10 minutes specifically on counseling on risks of tobacco use, complications, treatment, smoking cessation.      Relevant Medications   buPROPion (WELLBUTRIN SR) 150 MG 12 hr tablet   Oral yeast infection    Currently not active, has had flare of oral thrush due to inhaled steroid with Advair. - Appropriately rinsing mouth out with water after each use - Refill rx Diflucan for PRN use in future      Relevant Medications   fluconazole (DIFLUCAN) 200 MG tablet   COPD (chronic obstructive pulmonary disease) (HCC) - Primary    Stable, without exacerbation currently. Does endorse some occasional dyspnea on exertion, has not had oximetry testing. Last exacerbation 11/2016 Controlled on Advair, with PRN Albuterol inhaler/neb Not established with Pulmonology - 97% on RA  Plan: 1. Referral to [facility] for evaluation of COPD and patient requested testing for supplemental oxygen - 6 min walk test, and likely need overnight oximetry 2. Continue current dose Advair, Albuterol PRN 3. Smoking cessation - plans to quit now, see A&P 4. Follow-up       Relevant Orders   Ambulatory referral to Pulmonology    Other Visit Diagnoses    Encounter for smoking cessation counseling       Relevant Medications   buPROPion (WELLBUTRIN SR) 150 MG 12 hr tablet      Meds ordered this encounter  Medications  . fluconazole (DIFLUCAN) 200 MG tablet    Sig: Take 1 tablet (200 mg total) by mouth once as needed. For yeast infection, may repeat dose in 48 hours.    Dispense:  5 tablet    Refill:  1  . buPROPion (WELLBUTRIN SR) 150 MG 12 hr tablet    Sig: Start with one tablet daily for 3 days, then increase to one tab twice daily (8 hours apart), caution insomnia.    Dispense:  60 tablet    Refill:  2      Follow up  plan: Return in about 6 weeks (around 05/08/2017).  [name], [facility] 03/27/2017, 3:05 PM

## 2017-04-16 ENCOUNTER — Other Ambulatory Visit: Payer: Self-pay | Admitting: Nurse Practitioner

## 2017-04-16 DIAGNOSIS — B850 Pediculosis due to Pediculus humanus capitis: Secondary | ICD-10-CM

## 2017-04-16 MED ORDER — MALATHION 0.5 % EX LOTN: TOPICAL_LOTION | Freq: Once | CUTANEOUS | 0 refills | Status: AC

## 2017-05-05 ENCOUNTER — Ambulatory Visit (INDEPENDENT_AMBULATORY_CARE_PROVIDER_SITE_OTHER): Payer: Medicaid Other | Admitting: Internal Medicine

## 2017-05-05 ENCOUNTER — Encounter: Payer: Self-pay | Admitting: Internal Medicine

## 2017-05-05 VITALS — BP 128/78 | HR 99 | Resp 16 | Ht 66.0 in | Wt 129.0 lb

## 2017-05-05 DIAGNOSIS — R0600 Dyspnea, unspecified: Secondary | ICD-10-CM | POA: Diagnosis not present

## 2017-05-05 DIAGNOSIS — J449 Chronic obstructive pulmonary disease, unspecified: Secondary | ICD-10-CM

## 2017-05-05 MED ORDER — TIOTROPIUM BROMIDE MONOHYDRATE 2.5 MCG/ACT IN AERS
2.0000 | INHALATION_SPRAY | Freq: Every day | RESPIRATORY_TRACT | 5 refills | Status: DC
Start: 2017-05-05 — End: 2017-05-06

## 2017-05-05 MED ORDER — PREDNISONE 20 MG PO TABS: 40.0000 mg | ORAL_TABLET | Freq: Every day | ORAL | 0 refills | Status: DC

## 2017-05-05 NOTE — Patient Instructions (Addendum)
CT chest to assess COPD and acute process Start Spiriva Respimat 1.25 samples then 2.5  Continue advair Check ONO and 6MWT Prednisone 40 mg daily for 10 days

## 2017-05-05 NOTE — Progress Notes (Signed)
Name: Caroline Conley MRN: [id number] DOB: Jan 14, 1966    CONSULTATION DATE:  05/05/2017   REFERRING MD : [name]  CC: COPD evaluation  Previous PFT 2017 ratio 18%, Fev1 2.2L 74% fef25/75 0.78L 22%  HISTORY OF PRESENT ILLNESS: 51 yo WF with dx fo COPD Has 20 pound weight loss Intermittent hemoptysis Chronic SOB and DOE +wheezing and cough has inicreased for past few days Still smokes, 2 ppd for 40 years Uses advair, wants spiriva Uses albuteol nebs and inhaler as needed Has blood tinged sputum every now and then Last prednisone use was 8 months ago  No signs of infection at this time, but has COPD exacerbation now  PAST MEDICAL HISTORY :   has a past medical history of Cancer ([place]) (01/2016) and Wears dentures.  has a past surgical history that includes Rotator cuff repair; Debridement tennis elbow; Appendectomy; Tonsillectomy; Abdominal hysterectomy; Back surgery; Metatarsal osteotomy (05/18/2013); Cardiac catheterization (09/20/14); and Colonoscopy with propofol (N/A, 07/08/2016). Prior to Admission medications   Medication Sig Start Date End Date Taking? Authorizing Provider  albuterol (PROAIR HFA) 108 (90 Base) MCG/ACT inhaler Inhale 2 puffs into the lungs every 6 (six) hours as needed for wheezing or shortness of breath. 06/07/16   Krebs, Genevie Cheshire, NP  albuterol (PROVENTIL) (2.5 MG/3ML) 0.083% nebulizer solution Take 3 mLs (2.5 mg total) by nebulization every 6 (six) hours as needed for wheezing or shortness of breath. 11/18/16   Karamalegos, Devonne Doughty, DO  buPROPion (WELLBUTRIN SR) 150 MG 12 hr tablet Start with one tablet daily for 3 days, then increase to one tab twice daily (8 hours apart), caution insomnia. 03/27/17   Karamalegos, Devonne Doughty, DO  fluconazole (DIFLUCAN) 200 MG tablet Take 1 tablet (200 mg total) by mouth once as needed. For yeast infection, may repeat dose in 48 hours. 03/27/17   Karamalegos, Devonne Doughty, DO  Fluticasone-Salmeterol (ADVAIR) 250-50  MCG/DOSE AEPB Inhale 1 puff into the lungs 2 (two) times daily. 06/07/16   Luciana Axe, NP   Allergies  Allergen Reactions  . 2,4-D Dimethylamine (Amisol) Itching    (Pt unsure of this allergy)  . Morphine And Related Itching  . Vicodin [Hydrocodone-Acetaminophen] Nausea And Vomiting    GI distress    FAMILY HISTORY:  family history includes Arthritis/Rheumatoid in her father; Cancer in her father; Diabetes in her father and mother; Gout in her mother; High blood pressure in her father and mother; Stroke in her father. SOCIAL HISTORY:  reports that she has been smoking Cigarettes.  She has a 30.00 pack-year smoking history. She uses smokeless tobacco. She reports that she does not drink alcohol or use drugs.  REVIEW OF SYSTEMS:   Constitutional: Negative for fever, chills, weight loss, +malaise/fatigue  HENT: Negative for hearing loss, ear pain, nosebleeds, congestion, sore throat, neck pain, tinnitus and ear discharge.   Eyes: Negative for blurred vision, double vision, photophobia, pain, discharge and redness.  Respiratory: Negative for cough, hemoptysis, sputum production, shortness of breath, wheezing and stridor.   Cardiovascular: Negative for chest pain, palpitations, orthopnea, claudication, leg swelling and PND.  Gastrointestinal: Negative for heartburn, nausea, vomiting, abdominal pain, diarrhea, constipation, blood in stool and melena.  Genitourinary: Negative for dysuria, urgency, frequency, hematuria and flank pain.  Musculoskeletal: Negative for myalgias, back pain, joint pain and falls.  Skin: Negative for itching and rash.  Neurological: Negative for dizziness, tingling, tremors, sensory change, speech change, focal weakness, seizures, loss of consciousness, weakness and headaches.  Endo/Heme/Allergies: Negative for environmental allergies and  polydipsia. Does not bruise/bleed easily.    VITAL SIGNS: BP 128/78 (BP Location: Right Arm, Cuff Size: Normal)   Pulse 99    Resp 16   Ht 5\' 6"  (1.676 m)   Wt 129 lb (58.5 kg)   SpO2 96%   BMI 20.82 kg/m   Physical Examination:  GENERAL:NAD, no fevers, chills, no weakness no fatigue HEAD: Normocephalic, atraumatic.  EYES: Pupils equal, round, reactive to light. Extraocular muscles intact. No scleral icterus.  MOUTH: Moist mucosal membrane. Dentition intact. No abscess noted.  EAR, NOSE, THROAT: Clear without exudates. No external lesions.  NECK: Supple. No thyromegaly. No nodules. No JVD.  PULMONARY:  +wheezes CARDIOVASCULAR: S1 and S2. Regular rate and rhythm. No murmurs, rubs, or gallops. No edema. Pedal pulses 2+ bilaterally.  GASTROINTESTINAL: Soft, nontender, nondistended. No masses. Positive bowel sounds. No hepatosplenomegaly.  MUSCULOSKELETAL: No swelling, clubbing, or edema. Range of motion full in all extremities.  NEUROLOGIC: Cranial nerves II through XII are intact. No gross focal neurological deficits. Sensation intact. Reflexes intact.  SKIN: No ulceration, lesions, rashes, or cyanosis. Skin warm and dry. Turgor intact.  PSYCHIATRIC: Mood, affect within normal limits. The patient is awake, alert and oriented x 3. Insight, judgment intact.  ALL OTHER ROS ARE NEGATIVE      ASSESSMENT / PLAN: 51 yo white female with mild/moderate COPD stage D with COPD exacerbation with hemoptysis Start Prednisone 40 mg daily for 10 days Start Spiriva Respimat 1.25 samples then 2.5  Continue advair Check ONO and 6MWT CT chest to assess COPD and acute process   Follow up after tests completed   Patient/Family are satisfied with Plan of action and management. All questions answered  Corrin Parker, M.D.  [name]  Medical Director [place] Director [facility] Cardio-Pulmonary Department

## 2017-05-06 ENCOUNTER — Telehealth: Payer: Self-pay | Admitting: *Deleted

## 2017-05-06 DIAGNOSIS — R0600 Dyspnea, unspecified: Secondary | ICD-10-CM

## 2017-05-06 MED ORDER — TIOTROPIUM BROMIDE MONOHYDRATE 18 MCG IN CAPS: 18.0000 ug | ORAL_CAPSULE | Freq: Every day | RESPIRATORY_TRACT | 5 refills | Status: DC

## 2017-05-06 NOTE — Telephone Encounter (Signed)
Insurance denied Spiriva Resp so DK states to send in [facility]. Pt also informed she needs to have a CXR prior to having the CT scan. Pt states she wil go now to get CXR. RX sent nothing further needed.

## 2017-05-06 NOTE — Addendum Note (Signed)
Addended by: Devona Konig on: 05/06/2017 08:33 AM   Modules accepted: Orders

## 2017-05-07 ENCOUNTER — Ambulatory Visit
Admission: RE | Admit: 2017-05-07 | Discharge: 2017-05-07 | Disposition: A | Discharge status: Home / Self Care | Payer: Medicaid Other | Source: Ambulatory Visit | Attending: Internal Medicine | Admitting: Internal Medicine

## 2017-05-07 ENCOUNTER — Telehealth: Payer: Self-pay | Admitting: *Deleted

## 2017-05-07 ENCOUNTER — Other Ambulatory Visit: Payer: Self-pay | Admitting: *Deleted

## 2017-05-07 DIAGNOSIS — J449 Chronic obstructive pulmonary disease, unspecified: Secondary | ICD-10-CM

## 2017-05-07 DIAGNOSIS — R0600 Dyspnea, unspecified: Secondary | ICD-10-CM | POA: Diagnosis not present

## 2017-05-07 MED ORDER — TIOTROPIUM BROMIDE MONOHYDRATE 18 MCG IN CAPS
18.0000 ug | ORAL_CAPSULE | Freq: Every day | RESPIRATORY_TRACT | 5 refills | Status: DC
Start: 2017-05-07 — End: 2017-07-28

## 2017-05-08 NOTE — Telephone Encounter (Signed)
Called patient to confirm pharmacy.Caroline Conley

## 2017-05-09 ENCOUNTER — Encounter: Payer: Self-pay | Admitting: Internal Medicine

## 2017-05-09 DIAGNOSIS — R0600 Dyspnea, unspecified: Secondary | ICD-10-CM

## 2017-05-09 DIAGNOSIS — J449 Chronic obstructive pulmonary disease, unspecified: Secondary | ICD-10-CM

## 2017-05-14 ENCOUNTER — Ambulatory Visit: Payer: Medicaid Other

## 2017-05-16 ENCOUNTER — Ambulatory Visit: Payer: Medicaid Other

## 2017-05-16 ENCOUNTER — Other Ambulatory Visit: Payer: Self-pay | Admitting: Family Medicine

## 2017-05-16 DIAGNOSIS — B370 Candidal stomatitis: Secondary | ICD-10-CM

## 2017-05-20 ENCOUNTER — Telehealth: Payer: Self-pay | Admitting: *Deleted

## 2017-05-20 NOTE — Telephone Encounter (Signed)
Pt informed of ONO results and that she does NOT need O2 for night time.

## 2017-05-26 ENCOUNTER — Ambulatory Visit: Payer: Medicaid Other | Attending: Internal Medicine

## 2017-05-27 ENCOUNTER — Ambulatory Visit (INDEPENDENT_AMBULATORY_CARE_PROVIDER_SITE_OTHER): Payer: Medicaid Other | Admitting: *Deleted

## 2017-05-27 DIAGNOSIS — J42 Unspecified chronic bronchitis: Secondary | ICD-10-CM

## 2017-05-27 NOTE — Patient Instructions (Signed)
Return at next scheduled follow up.

## 2017-05-27 NOTE — Progress Notes (Signed)
SIX MIN WALK 05/27/2017  Medications none  Supplimental Oxygen during Test? (L/min) No  Laps 6  Partial Lap (in Meters) 1  Baseline BP (sitting) 120/64  Baseline Heartrate 73  Baseline Dyspnea (Borg Scale) 4  Baseline Fatigue (Borg Scale) 4  Baseline SPO2 96  BP (sitting) 138/70  Heartrate 104  Dyspnea (Borg Scale) 4  Fatigue (Borg Scale) 4  SPO2 97  BP (sitting) 122/74  Heartrate 76  SPO2 96  Stopped or Paused before Six Minutes Yes  Other Symptoms at end of Exercise 30 sec short of breath encouraged patient to continue walking  Distance Completed 289  Tech Comments: Patient walked for 6 mins she did not desat. Patient did not have angina, leg, hip or calf pain. Patient did not have any dizziness.    SMW performed today.

## 2017-07-21 ENCOUNTER — Other Ambulatory Visit: Payer: Self-pay | Admitting: Family Medicine

## 2017-07-21 DIAGNOSIS — Z720 Tobacco use: Secondary | ICD-10-CM

## 2017-07-21 DIAGNOSIS — Z716 Tobacco abuse counseling: Secondary | ICD-10-CM

## 2017-07-25 ENCOUNTER — Telehealth: Payer: Self-pay | Admitting: Family Medicine

## 2017-07-25 NOTE — Telephone Encounter (Signed)
Pt. Called requesting a referral to [facility]  Dermatology Pt .have appt Tuesday

## 2017-07-28 ENCOUNTER — Other Ambulatory Visit: Payer: Self-pay

## 2017-07-28 ENCOUNTER — Ambulatory Visit (INDEPENDENT_AMBULATORY_CARE_PROVIDER_SITE_OTHER): Payer: Medicaid Other | Admitting: Family Medicine

## 2017-07-28 ENCOUNTER — Encounter: Payer: Self-pay | Admitting: Family Medicine

## 2017-07-28 ENCOUNTER — Other Ambulatory Visit: Payer: Self-pay | Admitting: Family Medicine

## 2017-07-28 VITALS — BP 117/75 | HR 91 | Temp 98.0°F | Resp 16 | Ht 66.0 in | Wt 130.0 lb

## 2017-07-28 DIAGNOSIS — Z0000 Encounter for general adult medical examination without abnormal findings: Secondary | ICD-10-CM

## 2017-07-28 DIAGNOSIS — J410 Simple chronic bronchitis: Secondary | ICD-10-CM

## 2017-07-28 DIAGNOSIS — R7309 Other abnormal glucose: Secondary | ICD-10-CM

## 2017-07-28 DIAGNOSIS — Z85820 Personal history of malignant melanoma of skin: Secondary | ICD-10-CM | POA: Insufficient documentation

## 2017-07-28 DIAGNOSIS — E785 Hyperlipidemia, unspecified: Secondary | ICD-10-CM | POA: Insufficient documentation

## 2017-07-28 DIAGNOSIS — E782 Mixed hyperlipidemia: Secondary | ICD-10-CM

## 2017-07-28 DIAGNOSIS — R7989 Other specified abnormal findings of blood chemistry: Secondary | ICD-10-CM | POA: Insufficient documentation

## 2017-07-28 DIAGNOSIS — D649 Anemia, unspecified: Secondary | ICD-10-CM

## 2017-07-28 MED ORDER — LEVOFLOXACIN 500 MG PO TABS: 500.0000 mg | ORAL_TABLET | Freq: Every day | ORAL | 0 refills | Status: DC

## 2017-07-28 MED ORDER — HYDROCOD POLST-CPM POLST ER 10-8 MG/5ML PO SUER: 5.0000 mL | Freq: Two times a day (BID) | ORAL | 0 refills | Status: DC | PRN

## 2017-07-28 MED ORDER — FLUTICASONE-SALMETEROL 250-50 MCG/DOSE IN AEPB: 1.0000 | INHALATION_SPRAY | Freq: Two times a day (BID) | RESPIRATORY_TRACT | 3 refills | Status: DC

## 2017-07-28 MED ORDER — ALBUTEROL SULFATE HFA 108 (90 BASE) MCG/ACT IN AERS: 2.0000 | INHALATION_SPRAY | Freq: Four times a day (QID) | RESPIRATORY_TRACT | 5 refills | Status: DC | PRN

## 2017-07-28 MED ORDER — TIOTROPIUM BROMIDE MONOHYDRATE 18 MCG IN CAPS: 18.0000 ug | ORAL_CAPSULE | Freq: Every day | RESPIRATORY_TRACT | 1 refills | Status: DC

## 2017-07-28 MED ORDER — PREDNISONE 50 MG PO TABS: 50.0000 mg | ORAL_TABLET | Freq: Every day | ORAL | 0 refills | Status: DC

## 2017-07-28 NOTE — Progress Notes (Signed)
Subjective:    Patient ID: Caroline Conley, female    DOB: 1966/01/19, 51 y.o.   MRN: [id number]  Caroline Conley is a 51 y.o. female presenting on 07/28/2017 for Cough (SOB, nasal congestion onset 3-4 days)   HPI   COPD with possible exacerbation - Last visit with me 03/2017, for same problem with several AECOPD few times year recent in 11/2016 and 03/2017, she was treated with Levaquin and Prednisone in past, also on Advair and Albuterol, she was referred to [facility] Pulmonology as well for further management, see prior notes for background information. - Interval update with established Dr Mortimer Fries (Pulm) on 05/05/17, given sample of Spiriva and then rx Spiriva 2.5mg  respimat, she also had PFTs and other O2 eval. She never received rx Spiriva but the sample helped a great deal and wanted to continue this, checked today rx was sent to [place] in 04/2017, but patient switched pharmacies and she never got med and has not followed back up with Pulm. - Today patient reports that she has been experiencing some mild COPD flare symptoms again 3-4 days worsening cough and productive thicker sputum. - Currently taking Advair 1 puff twice daily and Albuterol PRN. Increased Albuterol use now with improvement - Has nebulizer machine as well, uses PRN with good results - Denies chest pain or pressure, dyspnea at rest, fever/chills  History of Melanoma: - Followed by Dr Evorn Gong at [facility] for melanoma, in past had dx melanoma and surgical treatment. Now she has concerns on few spots that she wanted checked out by Derm, requesting referral renewal for medicaid, she already has apt scheduled for tomorrow Tues 8/14 at 1030 with Dr Evorn Gong  Social History  Substance Use Topics  . Smoking status: Current Every Day Smoker    Packs/day: 1.00    Years: 30.00    Types: Cigarettes  . Smokeless tobacco: Current User  . Alcohol use No    Review of Systems Per HPI unless specifically indicated above    Objective:    BP 117/75   Pulse 91   Temp 98 F (36.7 C) (Oral)   Resp 16   Ht 5\' 6"  (1.676 m)   Wt 130 lb (59 kg)   SpO2 98%   BMI 20.98 kg/m   Wt Readings from Last 3 Encounters:  07/28/17 130 lb (59 kg)  05/05/17 129 lb (58.5 kg)  03/27/17 134 lb (60.8 kg)    Physical Exam  Constitutional: She is oriented to person, place, and time. She appears well-developed and well-nourished. No distress.  Well-appearing, comfortable, cooperative  HENT:  Head: Normocephalic and atraumatic.  Mouth/Throat: Oropharynx is clear and moist.  Poor dentition with denture  Eyes: Conjunctivae are normal. Right eye exhibits no discharge. Left eye exhibits no discharge.  Neck: Normal range of motion. Neck supple.  Cardiovascular: Normal rate, regular rhythm, normal heart sounds and intact distal pulses.   No murmur heard. Pulmonary/Chest: Effort normal. No respiratory distress. She has wheezes (Diffuse with higher pitched end expiratory wheezing). She has no rales.  Speaks full sentences. Occasional cough.  Musculoskeletal: Normal range of motion. She exhibits no edema.  Neurological: She is alert and oriented to person, place, and time.  Skin: Skin is warm and dry. No rash noted. She is not diaphoretic. No erythema.  Psychiatric: She has a normal mood and affect. Her behavior is normal.  Well groomed, good eye contact, normal speech and thoughts  Nursing note and vitals reviewed.  Assessment & Plan:   Problem List Items Addressed This Visit    Personal history of malignant melanoma    New skin concerns with abnormal moles, requesting repeat eval by Dermatology with her history of melanoma of buttocks Already established with Dr Evorn Gong [facility]) Scheduled for tomorrow 8/14 1030 Placed referral today for update Medcaid authorization status for her to see Derm      Relevant Orders   Ambulatory referral to Dermatology   COPD (chronic obstructive pulmonary disease) ([place]) -  Primary    Mild worsening early AECOPD, some inc wheezing and sputum production otherwise uncomplicated. Followed by [facility] Pulmonology Dr Mortimer Fries, last apt 04/2017 Suboptimal control on only Advair, had improved on Spiriva but she never got rx (sent to [facility] court and patient switched pharmacy) Inc use recently albuterol SpO2 98% on RA, with wheezing on exam  Plan: 1. Provide empiric therapy today for AECOPD with potential worsening, seems early, and patient unable to have close follow-up due to upcoming loss of insurance, agree with providing coverage with Prednisone burst 50mg  daily x 5 days and Levaquin 500mg  daily x 7 days if not improved 2. Continue Advair (refill for 90 day sent) and Albuterol ( 90 day sent) - 3 inhalers each, if not covered can just fill 1 3. For Spiriva 2.5mg  daily controller - she should contact [place] back to notify that pharmacy changed and never started, but improved on samples ,needs new rx 4. Follow-up with Pulm as planned, return criteria reviewed      Relevant Medications   levofloxacin (LEVAQUIN) 500 MG tablet   predniSONE (DELTASONE) 50 MG tablet   Fluticasone-Salmeterol (ADVAIR) 250-50 MCG/DOSE AEPB   albuterol (PROAIR HFA) 108 (90 Base) MCG/ACT inhaler   chlorpheniramine-HYDROcodone (TUSSIONEX PENNKINETIC ER) 10-8 MG/5ML SUER      Meds ordered this encounter  Medications  . oxyCODONE-acetaminophen (PERCOCET) 10-325 MG tablet    Sig: oxycodone-acetaminophen 10 mg-325 mg tablet  1 QIDPRN Fill on 07/30/2017  . levofloxacin (LEVAQUIN) 500 MG tablet    Sig: Take 1 tablet (500 mg total) by mouth daily. For 7 days    Dispense:  7 tablet    Refill:  0  . predniSONE (DELTASONE) 50 MG tablet    Sig: Take 1 tablet (50 mg total) by mouth daily with breakfast.    Dispense:  5 tablet    Refill:  0  . Fluticasone-Salmeterol (ADVAIR) 250-50 MCG/DOSE AEPB    Sig: Inhale 1 puff into the lungs 2 (two) times daily.    Dispense:  180 each    Refill:  3     Please dispense up to 3 advair diskus if able, patient request 90 day supply  . albuterol (PROAIR HFA) 108 (90 Base) MCG/ACT inhaler    Sig: Inhale 2 puffs into the lungs every 6 (six) hours as needed for wheezing or shortness of breath.    Dispense:  3 Inhaler    Refill:  5    Please dispense 3 inhalers for 90 day supply as requested  . chlorpheniramine-HYDROcodone (TUSSIONEX PENNKINETIC ER) 10-8 MG/5ML SUER    Sig: Take 5 mLs by mouth every 12 (twelve) hours as needed for cough.    Dispense:  140 mL    Refill:  0      Follow up plan: Return in about 1 week (around 08/04/2017) for Annual Physical.  [name], DO [facility] 07/28/2017, 1:41 PM

## 2017-07-28 NOTE — Telephone Encounter (Signed)
90 day

## 2017-07-28 NOTE — Assessment & Plan Note (Signed)
Mild worsening early AECOPD, some inc wheezing and sputum production otherwise uncomplicated. Followed by [facility] Pulmonology Dr Mortimer Fries, last apt 04/2017 Suboptimal control on only Advair, had improved on Spiriva but she never got rx (sent to [facility] court and patient switched pharmacy) Inc use recently albuterol SpO2 98% on RA, with wheezing on exam  Plan: 1. Provide empiric therapy today for AECOPD with potential worsening, seems early, and patient unable to have close follow-up due to upcoming loss of insurance, agree with providing coverage with Prednisone burst 50mg  daily x 5 days and Levaquin 500mg  daily x 7 days if not improved 2. Continue Advair (refill for 90 day sent) and Albuterol ( 90 day sent) - 3 inhalers each, if not covered can just fill 1 3. For Spiriva 2.5mg  daily controller - she should contact [place] back to notify that pharmacy changed and never started, but improved on samples ,needs new rx 4. Follow-up with Pulm as planned, return criteria reviewed

## 2017-07-28 NOTE — Assessment & Plan Note (Signed)
New skin concerns with abnormal moles, requesting repeat eval by Dermatology with her history of melanoma of buttocks Already established with Dr Evorn Gong [facility] Dermatology) Scheduled for tomorrow 8/14 1030 Placed referral today for update Medcaid authorization status for her to see Derm

## 2017-07-28 NOTE — Patient Instructions (Addendum)
Thank you for coming to the clinic today.  1.  Call Dr Mortimer Fries at [facility] Pulmonology to discuss your COPD breathing inhalers - specifically the [place] that was working with the samples before.  Follow-up with Pulm before lose insurance and get the inhaler refilled   Tiotropium Bromide Monohydrate 2.5 MCG/ACT 2 puffs Inhalation Daily  DUE for FASTING BLOOD WORK (no food or drink after midnight before the lab appointment, only water or coffee without cream/sugar on the morning of)  SCHEDULE "Lab Only" visit in the morning at the clinic for lab draw in 1-3 days  - Make sure Lab Only appointment is at about 1 week before your next appointment, so that results will be available  For Lab Results, once available within 2-3 days of blood draw, you can can log in to MyChart online to view your results and a brief explanation. Also, we can discuss results at next follow-up visit.    Please schedule a Follow-up Appointment to: Return in about 1 week (around 08/04/2017) for Annual Physical.  If you have any other questions or concerns, please feel free to call the clinic or send a message through [place]. You may also schedule an earlier appointment if necessary.  Additionally, you may be receiving a survey about your experience at our clinic within a few days to 1 week by e-mail or mail. We value your feedback.  [name], DO [place]

## 2017-07-29 ENCOUNTER — Telehealth: Payer: Self-pay | Admitting: *Deleted

## 2017-07-29 NOTE — Telephone Encounter (Signed)
[facility]  PA-Spirivia 30/30  Approved for 1 year Ticket# [id number] Faxed auth to [facility] [facility] [employer] will not authorize 90 day.

## 2017-07-30 ENCOUNTER — Other Ambulatory Visit: Payer: Medicaid Other

## 2017-07-31 LAB — CBC WITH DIFFERENTIAL/PLATELET
BASOS ABS: 0 {cells}/uL (ref 0–200)
Basophils Relative: 0 %
EOS PCT: 2 %
Eosinophils Absolute: 198 cells/uL (ref 15–500)
HCT: 39.9 % (ref 35.0–45.0)
Hemoglobin: 13.4 g/dL (ref 11.7–15.5)
LYMPHS PCT: 24 %
Lymphs Abs: 2376 cells/uL (ref 850–3900)
MCH: 32.2 pg (ref 27.0–33.0)
MCHC: 33.6 g/dL (ref 32.0–36.0)
MCV: 95.9 fL (ref 80.0–100.0)
MONOS PCT: 6 %
MPV: 9.1 fL (ref 7.5–12.5)
Monocytes Absolute: 594 cells/uL (ref 200–950)
NEUTROS ABS: 6732 {cells}/uL (ref 1500–7800)
NEUTROS PCT: 68 %
PLATELETS: 266 10*3/uL (ref 140–400)
RBC: 4.16 MIL/uL (ref 3.80–5.10)
RDW: 12.9 % (ref 11.0–15.0)
WBC: 9.9 10*3/uL (ref 3.8–10.8)

## 2017-08-01 LAB — COMPLETE METABOLIC PANEL WITH GFR
ALT: 16 U/L (ref 6–29)
AST: 16 U/L (ref 10–35)
Albumin: 3.7 g/dL (ref 3.6–5.1)
Alkaline Phosphatase: 109 U/L (ref 33–130)
BUN: 6 mg/dL — AB (ref 7–25)
CHLORIDE: 105 mmol/L (ref 98–110)
CO2: 25 mmol/L (ref 20–32)
CREATININE: 1.04 mg/dL (ref 0.50–1.05)
Calcium: 9.3 mg/dL (ref 8.6–10.4)
GFR, EST AFRICAN AMERICAN: 72 mL/min (ref >=60)
GFR, Est Non African American: 62 mL/min (ref >=60)
Glucose, Bld: 126 mg/dL — ABNORMAL HIGH (ref 65–99)
POTASSIUM: 3.7 mmol/L (ref 3.5–5.3)
Sodium: 140 mmol/L (ref 135–146)
Total Bilirubin: 0.4 mg/dL (ref 0.2–1.2)
Total Protein: 6.1 g/dL (ref 6.1–8.1)

## 2017-08-01 LAB — LIPID PANEL
CHOL/HDL RATIO: 4.8 ratio (ref <5.0)
Cholesterol: 169 mg/dL (ref <200)
HDL: 35 mg/dL — ABNORMAL LOW (ref >50)
LDL CALC: 85 mg/dL (ref <100)
TRIGLYCERIDES: 243 mg/dL — AB (ref <150)
VLDL: 49 mg/dL — AB (ref <30)

## 2017-08-01 LAB — HEMOGLOBIN A1C
HEMOGLOBIN A1C: 4.9 % (ref <5.7)
Mean Plasma Glucose: 94 mg/dL

## 2017-08-04 ENCOUNTER — Ambulatory Visit: Payer: Medicaid Other | Admitting: Family Medicine

## 2017-08-06 ENCOUNTER — Encounter: Payer: Self-pay | Admitting: Family Medicine

## 2017-08-06 ENCOUNTER — Ambulatory Visit (INDEPENDENT_AMBULATORY_CARE_PROVIDER_SITE_OTHER): Payer: Medicaid Other | Admitting: Family Medicine

## 2017-08-06 VITALS — BP 103/58 | HR 83 | Temp 98.3°F | Resp 16 | Ht 66.0 in | Wt 130.0 lb

## 2017-08-06 DIAGNOSIS — R7989 Other specified abnormal findings of blood chemistry: Secondary | ICD-10-CM | POA: Diagnosis not present

## 2017-08-06 DIAGNOSIS — J410 Simple chronic bronchitis: Secondary | ICD-10-CM

## 2017-08-06 DIAGNOSIS — Z716 Tobacco abuse counseling: Secondary | ICD-10-CM | POA: Diagnosis not present

## 2017-08-06 DIAGNOSIS — E782 Mixed hyperlipidemia: Secondary | ICD-10-CM | POA: Diagnosis not present

## 2017-08-06 DIAGNOSIS — Z720 Tobacco use: Secondary | ICD-10-CM | POA: Diagnosis not present

## 2017-08-06 DIAGNOSIS — Z23 Encounter for immunization: Secondary | ICD-10-CM | POA: Diagnosis not present

## 2017-08-06 DIAGNOSIS — Z0000 Encounter for general adult medical examination without abnormal findings: Secondary | ICD-10-CM

## 2017-08-06 DIAGNOSIS — R7309 Other abnormal glucose: Secondary | ICD-10-CM | POA: Diagnosis not present

## 2017-08-06 NOTE — Patient Instructions (Addendum)
Thank you for coming to the clinic today.  1. Congratulations on first steps of quitting smoking, I think you can do this, follow instructions with nicotine patches, let me know if questions  2. Flu shot today  3. Not due for pneumonia vaccine  4. Lab results good, keep working on lifestyle to control cholesterol, low carb low sugar low fat diet  Please schedule a Follow-up Appointment to: Return in about 4 months (around 12/06/2017) for Smoking / COPD.  If you have any other questions or concerns, please feel free to call the clinic or send a message through [place]. You may also schedule an earlier appointment if necessary.  Additionally, you may be receiving a survey about your experience at our clinic within a few days to 1 week by e-mail or mail. We value your feedback.  [name], DO [facility], [facility]   DASH Eating Plan DASH stands for "Dietary Approaches to Stop Hypertension." The DASH eating plan is a healthy eating plan that has been shown to reduce high blood pressure (hypertension). It may also reduce your risk for type 2 diabetes, heart disease, and stroke. The DASH eating plan may also help with weight loss. What are tips for following this plan? General guidelines  Avoid eating more than 2,300 mg (milligrams) of salt (sodium) a day. If you have hypertension, you may need to reduce your sodium intake to 1,500 mg a day.  Limit alcohol intake to no more than 1 drink a day for nonpregnant women and 2 drinks a day for men. One drink equals 12 oz of beer, 5 oz of wine, or 1 oz of hard liquor.  Work with your health care provider to maintain a healthy body weight or to lose weight. Ask what an ideal weight is for you.  Get at least 30 minutes of exercise that causes your heart to beat faster (aerobic exercise) most days of the week. Activities may include walking, swimming, or biking.  Work with your health care provider or diet and nutrition  specialist (dietitian) to adjust your eating plan to your individual calorie needs. Reading food labels  Check food labels for the amount of sodium per serving. Choose foods with less than 5 percent of the Daily Value of sodium. Generally, foods with less than 300 mg of sodium per serving fit into this eating plan.  To find whole grains, look for the word "whole" as the first word in the ingredient list. Shopping  Buy products labeled as "low-sodium" or "no salt added."  Buy fresh foods. Avoid canned foods and premade or frozen meals. Cooking  Avoid adding salt when cooking. Use salt-free seasonings or herbs instead of table salt or sea salt. Check with your health care provider or pharmacist before using salt substitutes.  Do not fry foods. Cook foods using healthy methods such as baking, boiling, grilling, and broiling instead.  Cook with heart-healthy oils, such as olive, canola, soybean, or sunflower oil. Meal planning   Eat a balanced diet that includes: ? 5 or more servings of fruits and vegetables each day. At each meal, try to fill half of your plate with fruits and vegetables. ? Up to 6-8 servings of whole grains each day. ? Less than 6 oz of lean meat, poultry, or fish each day. A 3-oz serving of meat is about the same size as a deck of cards. One egg equals 1 oz. ? 2 servings of low-fat dairy each day. ? A serving of nuts,  seeds, or beans 5 times each week. ? Heart-healthy fats. Healthy fats called Omega-3 fatty acids are found in foods such as flaxseeds and coldwater fish, like sardines, salmon, and mackerel.  Limit how much you eat of the following: ? Canned or prepackaged foods. ? Food that is high in trans fat, such as fried foods. ? Food that is high in saturated fat, such as fatty meat. ? Sweets, desserts, sugary drinks, and other foods with added sugar. ? Full-fat dairy products.  Do not salt foods before eating.  Try to eat at least 2 vegetarian meals each  week.  Eat more home-cooked food and less restaurant, buffet, and fast food.  When eating at a restaurant, ask that your food be prepared with less salt or no salt, if possible. What foods are recommended? The items listed may not be a complete list. Talk with your dietitian about what dietary choices are best for you. Grains Whole-grain or whole-wheat bread. Whole-grain or whole-wheat pasta. Brown rice. [place]. Bulgur. Whole-grain and low-sodium cereals. Pita bread. Low-fat, low-sodium crackers. Whole-wheat flour tortillas. Vegetables Fresh or frozen vegetables (raw, steamed, roasted, or grilled). Low-sodium or reduced-sodium tomato and vegetable juice. Low-sodium or reduced-sodium tomato sauce and tomato paste. Low-sodium or reduced-sodium canned vegetables. Fruits All fresh, dried, or frozen fruit. Canned fruit in natural juice (without added sugar). Meat and other protein foods Skinless chicken or [place]. Ground chicken or [place]. Pork with fat trimmed off. Fish and seafood. Egg whites. Dried beans, peas, or lentils. Unsalted nuts, nut butters, and seeds. Unsalted canned beans. Lean cuts of beef with fat trimmed off. Low-sodium, lean deli meat. Dairy Low-fat (1%) or fat-free (skim) milk. Fat-free, low-fat, or reduced-fat cheeses. Nonfat, low-sodium ricotta or cottage cheese. Low-fat or nonfat yogurt. Low-fat, low-sodium cheese. Fats and oils Soft margarine without trans fats. Vegetable oil. Low-fat, reduced-fat, or light mayonnaise and salad dressings (reduced-sodium). Canola, safflower, olive, soybean, and sunflower oils. Avocado. Seasoning and other foods Herbs. Spices. Seasoning mixes without salt. Unsalted popcorn and pretzels. Fat-free sweets. What foods are not recommended? The items listed may not be a complete list. Talk with your dietitian about what dietary choices are best for you. Grains Baked goods made with fat, such as croissants, muffins, or some breads. Dry pasta  or rice meal packs. Vegetables Creamed or fried vegetables. Vegetables in a cheese sauce. Regular canned vegetables (not low-sodium or reduced-sodium). Regular canned tomato sauce and paste (not low-sodium or reduced-sodium). Regular tomato and vegetable juice (not low-sodium or reduced-sodium). Angie Fava. Olives. Fruits Canned fruit in a light or heavy syrup. Fried fruit. Fruit in cream or butter sauce. Meat and other protein foods Fatty cuts of meat. Ribs. Fried meat. Berniece Salines. Sausage. Bologna and other processed lunch meats. Salami. Fatback. Hotdogs. Bratwurst. Salted nuts and seeds. Canned beans with added salt. Canned or smoked fish. Whole eggs or egg yolks. Chicken or [place] with skin. Dairy Whole or 2% milk, cream, and half-and-half. Whole or full-fat cream cheese. Whole-fat or sweetened yogurt. Full-fat cheese. Nondairy creamers. Whipped toppings. Processed cheese and cheese spreads. Fats and oils Butter. Stick margarine. Lard. Shortening. Ghee. Bacon fat. Tropical oils, such as coconut, palm kernel, or palm oil. Seasoning and other foods Salted popcorn and pretzels. Onion salt, garlic salt, seasoned salt, table salt, and sea salt. Worcestershire sauce. Tartar sauce. Barbecue sauce. Teriyaki sauce. Soy sauce, including reduced-sodium. Steak sauce. Canned and packaged gravies. Fish sauce. Oyster sauce. Cocktail sauce. Horseradish that you find on the shelf. Ketchup. Mustard. Meat flavorings and  tenderizers. Bouillon cubes. Hot sauce and Tabasco sauce. Premade or packaged marinades. Premade or packaged taco seasonings. Relishes. Regular salad dressings. Where to find more information:  National Heart, Lung, and [place]: https://wilson-eaton.com/  American Heart Association: www.heart.org Summary  The DASH eating plan is a healthy eating plan that has been shown to reduce high blood pressure (hypertension). It may also reduce your risk for type 2 diabetes, heart disease, and stroke.  With the  DASH eating plan, you should limit salt (sodium) intake to 2,300 mg a day. If you have hypertension, you may need to reduce your sodium intake to 1,500 mg a day.  When on the DASH eating plan, aim to eat more fresh fruits and vegetables, whole grains, lean proteins, low-fat dairy, and heart-healthy fats.  Work with your health care provider or diet and nutrition specialist (dietitian) to adjust your eating plan to your individual calorie needs. This information is not intended to replace advice given to you by your health care provider. Make sure you discuss any questions you have with your health care provider. Document Released: 11/21/2011 Document Revised: 11/25/2016 Document Reviewed: 11/25/2016 Elsevier Interactive Patient Education  2017 [employer].

## 2017-08-06 NOTE — Assessment & Plan Note (Signed)
Improved on re-check, with GFR >60 Follow-up yearly Cr trend or sooner if other labs

## 2017-08-06 NOTE — Assessment & Plan Note (Signed)
Recently quit, smoke free for 2 days, on NRT patches - Complicated by COPD - Prior failed quit attempts (chantix, wellbutrin, NRT gum, patches) - Awaiting age >23 for Low Dose Lung CT screen  Plan: 1. Already has existing NRT patches, currently using 21mg  with good results, tolerating well for 2 days and smoke free. Handout given regarding management with patches, expectations, how to quit, and how to taper, 4-6 weeks at 21 mg then 2 weeks at 14 mg then 2 weeks at 7 mg 2. Follow-up 4-6 weeks, monitor progress, if needed otherwise plan follow-up 4 months due to insurance problem  Discussion today >5 (but less than 10) minutes specifically on counseling on risks of tobacco use, complications, treatment, smoking cessation.

## 2017-08-06 NOTE — Assessment & Plan Note (Signed)
Uncontrolled cholesterol high TG and low HDL, poor lifestyle Last lipid panel 07/2017 Calculated ASCVD 10 yr risk score 4.3% (inc due to smoking)  Plan: 1. Encourage improved lifestyle - low carb/cholesterol, reduce portion size, start regular exercise - handout DASH diet 2. Follow-up as needed, yearly lipids

## 2017-08-06 NOTE — Assessment & Plan Note (Signed)
Stable, chronic COPD, resolved recent flare Followed by  Pulm Has refills all meds Advair, Spiriva, Albuterol  Plan: 1. Encourage her to remain smoke free, now quit x 2 days and on NRT patches 2. Follow-up as needed

## 2017-08-06 NOTE — Progress Notes (Signed)
Subjective:    Patient ID: Caroline Conley, female    DOB: [birth year], 51 y.o.   MRN: [id number]  Caroline Conley is a 51 y.o. female presenting on 08/06/2017 for Hyperlipidemia   HPI   Here for Annual Physical and Lab Review.  Hyperglycemia w/o Pre-Diabetes - Reviewed lab results with abnormal glucose, with normal A1c  Hypertriglyceridemia / Dyslipidemia (Low HDL) - Last lipid panel 07/2017, abnormal elevated TG and low HDL, normal LDL - Never on statin therapy or other cholesterol medicine Lifestyle - Diet: variable, admits to not following appropriate diet - Exercise: limited regular exercise  COPD - Last flare 07/28/17, treated with Prednisone and Levaquin, also sent refills Advair and Albuterol, she is followed by [facility] and obtained refill from them for Spiriva. - Today she reports overall breathing is improved, flare is resolved, now quit smoking, on NRT patches see below.  RIGHT EYE, REDNESS - Reports recent concern that she had R eye redness and irritation, she went to her eye doctor recently and dx with infection was told likely due to rubbing eye after being outside, treated with antibiotic oral and topical eye ointment with improvement now  FORMER SMOKER / History of Tobacco Abuse - Known chronic tobacco abuse >1ppd for >30 years, never successfully quit in past, failed Chantix, Wellbutrin, NRT gum and patch and NRT nasal spray. - Now she is determined to quit smoking, feels she is ready, and using NRT patches, she has been smoke free for 2 days now, and asking about advice on how to continue using the patches.  Health Maintenance: - Due for Flu Shot 2018, received today  There are no preventive care reminders to display for this patient.   Past Medical History:  Diagnosis Date  . Cancer ([place]) 01/2016   basal cell   . Wears dentures    full upper and lower   Past Surgical History:  Procedure Laterality Date  . ABDOMINAL HYSTERECTOMY    .  APPENDECTOMY    . BACK SURGERY     this will make third time  . CARDIAC CATHETERIZATION  09/20/14   [facility] - Dr. Clayborn Bigness  . COLONOSCOPY WITH PROPOFOL N/A 07/08/2016   Procedure: COLONOSCOPY WITH PROPOFOL;  Surgeon: Lucilla Lame, MD;  Location: [facility];  Service: Endoscopy;  Laterality: N/A;  . DEBRIDEMENT TENNIS ELBOW    . METATARSAL OSTEOTOMY  05/18/2013  . ROTATOR CUFF REPAIR    . TONSILLECTOMY     Social History   Social History  . Marital status: Married    Spouse name: N/A  . Number of children: N/A  . Years of education: N/A   Occupational History  . Not on file.   Social History Main Topics  . Smoking status: Former Smoker    Packs/day: 1.00    Years: 30.00    Types: Cigarettes    Quit date: 08/04/2017  . Smokeless tobacco: Former Systems developer  . Alcohol use No  . Drug use: No  . Sexual activity: Yes   Other Topics Concern  . Not on file   Social History Narrative  . No narrative on file   Family History  Problem Relation Age of Onset  . Diabetes Mother   . High blood pressure Mother   . Gout Mother   . Cancer Father   . Stroke Father   . Arthritis/Rheumatoid Father   . Diabetes Father   . High blood pressure Father    Current Outpatient Prescriptions on File Prior  to Visit  Medication Sig  . albuterol (PROAIR HFA) 108 (90 Base) MCG/ACT inhaler Inhale 2 puffs into the lungs every 6 (six) hours as needed for wheezing or shortness of breath.  Marland Kitchen albuterol (PROVENTIL) (2.5 MG/3ML) 0.083% nebulizer solution Take 3 mLs (2.5 mg total) by nebulization every 6 (six) hours as needed for wheezing or shortness of breath.  Marland Kitchen buPROPion (WELLBUTRIN SR) 150 MG 12 hr tablet TAKE 1 TABLET BY MOUTH ONCE DAILY FOR 3 DAYS  THEN INCREASE TO 1 TWICE DAILY  . chlorpheniramine-HYDROcodone (TUSSIONEX PENNKINETIC ER) 10-8 MG/5ML SUER Take 5 mLs by mouth every 12 (twelve) hours as needed for cough.  . fluconazole (DIFLUCAN) 200 MG tablet TAKE 1 TABLET BY MOUTH ONCE AS NEEDED FOR  YEAST  MAY REPEAT DOSE IN 48 HOURS  . Fluticasone-Salmeterol (ADVAIR) 250-50 MCG/DOSE AEPB Inhale 1 puff into the lungs 2 (two) times daily.  Marland Kitchen oxyCODONE-acetaminophen (PERCOCET) 10-325 MG tablet oxycodone-acetaminophen 10 mg-325 mg tablet  1 QIDPRN Fill on 07/30/2017  . tiotropium (SPIRIVA) 18 MCG inhalation capsule Place 1 capsule (18 mcg total) into inhaler and inhale daily.   No current facility-administered medications on file prior to visit.     Review of Systems  Constitutional: Negative for activity change, appetite change, chills, diaphoresis, fatigue, fever and unexpected weight change.  HENT: Negative for congestion, hearing loss and sinus pressure.   Eyes: Positive for redness and itching. Negative for visual disturbance.  Respiratory: Negative for apnea, cough, chest tightness, shortness of breath and wheezing.   Cardiovascular: Negative for chest pain, palpitations and leg swelling.  Gastrointestinal: Negative for abdominal pain, anal bleeding, blood in stool, constipation, diarrhea, nausea and vomiting.  Endocrine: Negative for cold intolerance.  Genitourinary: Negative for decreased urine volume, difficulty urinating, dysuria, frequency and hematuria.  Musculoskeletal: Negative for arthralgias and neck pain.  Skin: Negative for rash.  Allergic/Immunologic: Negative for environmental allergies.  Neurological: Negative for dizziness, weakness, light-headedness, numbness and headaches.  Hematological: Negative for adenopathy.  Psychiatric/Behavioral: Negative for behavioral problems, dysphoric mood and sleep disturbance. The patient is not nervous/anxious.    Per HPI unless specifically indicated above     Objective:    BP (!) 103/58   Pulse 83   Temp 98.3 F (36.8 C) (Oral)   Resp 16   Ht '5\' 6"'  (1.676 m)   Wt 130 lb (59 kg)   BMI 20.98 kg/m   Wt Readings from Last 3 Encounters:  08/06/17 130 lb (59 kg)  07/28/17 130 lb (59 kg)  05/05/17 129 lb (58.5 kg)      Physical Exam  Constitutional: She is oriented to person, place, and time. She appears well-developed and well-nourished. No distress.  Well-appearing, comfortable, cooperative  HENT:  Head: Normocephalic and atraumatic.  Mouth/Throat: Oropharynx is clear and moist.  Poor dentition, dentures  Eyes: Pupils are equal, round, and reactive to light. EOM are normal. Right eye exhibits no discharge. Left eye exhibits no discharge.  R eye mild conjunctival injection  Neck: Normal range of motion. Neck supple. No thyromegaly present.  Cardiovascular: Normal rate, regular rhythm, normal heart sounds and intact distal pulses.   No murmur heard. Pulmonary/Chest: Effort normal and breath sounds normal. No respiratory distress. She has no wheezes. She has no rales.  Improved air movement today, no focal abnormality.  Abdominal: Soft. Bowel sounds are normal. She exhibits no distension and no mass. There is no tenderness.  Musculoskeletal: Normal range of motion. She exhibits no edema or tenderness.  Upper / Lower  Extremities: - Normal muscle tone, strength bilateral upper extremities 5/5, lower extremities 5/5  Lymphadenopathy:    She has no cervical adenopathy.  Neurological: She is alert and oriented to person, place, and time.  Distal sensation intact to light touch all extremities  Skin: Skin is warm and dry. No rash noted. She is not diaphoretic. No erythema.  29m nicotine patch on currently  Bandage from excisional derm biopsy on L neck  Psychiatric: She has a normal mood and affect. Her behavior is normal.  Well groomed, good eye contact, normal speech and thoughts  Nursing note and vitals reviewed.    Results for orders placed or performed in visit on 07/30/17  COMPLETE METABOLIC PANEL WITH GFR  Result Value Ref Range   Sodium 140 135 - 146 mmol/L   Potassium 3.7 3.5 - 5.3 mmol/L   Chloride 105 98 - 110 mmol/L   CO2 25 20 - 32 mmol/L   Glucose, Bld 126 (H) 65 - 99 mg/dL   BUN 6  (L) 7 - 25 mg/dL   Creat 1.04 0.50 - 1.05 mg/dL   Total Bilirubin 0.4 0.2 - 1.2 mg/dL   Alkaline Phosphatase 109 33 - 130 U/L   AST 16 10 - 35 U/L   ALT 16 6 - 29 U/L   Total Protein 6.1 6.1 - 8.1 g/dL   Albumin 3.7 3.6 - 5.1 g/dL   Calcium 9.3 8.6 - 10.4 mg/dL   GFR, Est African American 72 >=60 mL/min   GFR, Est Non African American 62 >=60 mL/min  Lipid panel  Result Value Ref Range   Cholesterol 169 <200 mg/dL   Triglycerides 243 (H) <150 mg/dL   HDL 35 (L) >50 mg/dL   Total CHOL/HDL Ratio 4.8 <5.0 Ratio   VLDL 49 (H) <30 mg/dL   LDL Cholesterol 85 <100 mg/dL  CBC with Differential/Platelet  Result Value Ref Range   WBC 9.9 3.8 - 10.8 K/uL   RBC 4.16 3.80 - 5.10 MIL/uL   Hemoglobin 13.4 11.7 - 15.5 g/dL   HCT 39.9 35.0 - 45.0 %   MCV 95.9 80.0 - 100.0 fL   MCH 32.2 27.0 - 33.0 pg   MCHC 33.6 32.0 - 36.0 g/dL   RDW 12.9 11.0 - 15.0 %   Platelets 266 140 - 400 K/uL   MPV 9.1 7.5 - 12.5 fL   Neutro Abs 6,732 1,500 - 7,800 cells/uL   Lymphs Abs 2,376 850 - 3,900 cells/uL   Monocytes Absolute 594 200 - 950 cells/uL   Eosinophils Absolute 198 15 - 500 cells/uL   Basophils Absolute 0 0 - 200 cells/uL   Neutrophils Relative % 68 %   Lymphocytes Relative 24 %   Monocytes Relative 6 %   Eosinophils Relative 2 %   Basophils Relative 0 %   Smear Review Criteria for review not met   Hemoglobin A1c  Result Value Ref Range   Hgb A1c MFr Bld 4.9 <5.7 %   Mean Plasma Glucose 94 mg/dL      Assessment & Plan:   Problem List Items Addressed This Visit    Tobacco abuse    Recently quit, smoke free for 2 days, on NRT patches - Complicated by COPD - Prior failed quit attempts (chantix, wellbutrin, NRT gum, patches) - Awaiting age >>80for Low Dose Lung CT screen  Plan: 1. Already has existing NRT patches, currently using 254mwith good results, tolerating well for 2 days and smoke free. Handout given regarding management with  patches, expectations, how to quit, and how to taper,  4-6 weeks at 21 mg then 2 weeks at 14 mg then 2 weeks at 7 mg 2. Follow-up 4-6 weeks, monitor progress, if needed otherwise plan follow-up 4 months due to insurance problem  Discussion today >5 (but less than 10) minutes specifically on counseling on risks of tobacco use, complications, treatment, smoking cessation.      Hyperlipidemia    Uncontrolled cholesterol high TG and low HDL, poor lifestyle Last lipid panel 07/2017 Calculated ASCVD 10 yr risk score 4.3% (inc due to smoking)  Plan: 1. Encourage improved lifestyle - low carb/cholesterol, reduce portion size, start regular exercise - handout DASH diet 2. Follow-up as needed, yearly lipids      Elevated serum creatinine    Improved on re-check, with GFR >60 Follow-up yearly Cr trend or sooner if other labs      COPD (chronic obstructive pulmonary disease) (HCC)    Stable, chronic COPD, resolved recent flare Followed by [place] [facility] Has refills all meds Advair, Spiriva, Albuterol  Plan: 1. Encourage her to remain smoke free, now quit x 2 days and on NRT patches 2. Follow-up as needed       Other Visit Diagnoses    Annual physical exam    -  Primary   Encounter for smoking cessation counseling       Needs flu shot       Relevant Orders   Flu Vaccine QUAD 36+ mos IM (Completed)   Abnormal glucose       Likely secondary to poor diet/lifestyle, normal A1c 4.9, encouraged diet improvements, follow-up A1c in 1 year      No orders of the defined types were placed in this encounter.   Follow up plan: Return in about 4 months (around 12/06/2017) for Smoking / COPD.  [name], [facility] 08/06/2017, 10:21 PM

## 2017-08-11 ENCOUNTER — Ambulatory Visit: Payer: Self-pay | Admitting: Podiatry

## 2017-08-13 ENCOUNTER — Ambulatory Visit: Payer: Medicaid Other

## 2017-08-13 ENCOUNTER — Encounter (INDEPENDENT_AMBULATORY_CARE_PROVIDER_SITE_OTHER): Payer: Medicaid Other | Admitting: Podiatry

## 2017-08-13 DIAGNOSIS — M2012 Hallux valgus (acquired), left foot: Secondary | ICD-10-CM

## 2017-08-13 NOTE — Progress Notes (Signed)
This encounter was created in error - please disregard.

## 2017-08-27 ENCOUNTER — Ambulatory Visit (INDEPENDENT_AMBULATORY_CARE_PROVIDER_SITE_OTHER): Payer: Self-pay | Admitting: Internal Medicine

## 2017-08-27 ENCOUNTER — Ambulatory Visit
Admission: RE | Admit: 2017-08-27 | Discharge: 2017-08-27 | Disposition: A | Discharge status: Home / Self Care | Payer: Medicaid Other | Source: Ambulatory Visit | Attending: Internal Medicine | Admitting: Internal Medicine

## 2017-08-27 ENCOUNTER — Encounter: Payer: Self-pay | Admitting: Internal Medicine

## 2017-08-27 VITALS — BP 108/82 | HR 93 | Ht 66.0 in | Wt 133.0 lb

## 2017-08-27 DIAGNOSIS — R0600 Dyspnea, unspecified: Secondary | ICD-10-CM | POA: Insufficient documentation

## 2017-08-27 DIAGNOSIS — J449 Chronic obstructive pulmonary disease, unspecified: Secondary | ICD-10-CM

## 2017-08-27 DIAGNOSIS — J439 Emphysema, unspecified: Secondary | ICD-10-CM | POA: Insufficient documentation

## 2017-08-27 NOTE — Patient Instructions (Signed)
Continue Spiriva and advair

## 2017-08-27 NOTE — Progress Notes (Signed)
   Name: Caroline Conley MRN: [id number] DOB: 1966-02-11    CONSULTATION DATE:  08/27/2017   REFERRING MD : [name]  CC: COPD evaluation  Previous PFT 2017 ratio 18%, Fev1 2.2L 74% fef25/75 0.78L 22%  HISTORY OF PRESENT ILLNESS: 51 yo WF with dx fo COPD Has 20 pound weight loss Intermittent hemoptysis Chronic SOB and DOE  CT of the chest reviewed with patient and family there is no lung masses noted however there is areas of emphysema in the upper lobe region bilaterally  Still smokes, 2 ppd for 40 years Uses advair, spiriva Uses albuteol nebs and inhaler as needed Patient had a recent bout of COPD exacerbation approximately 2 weeks ago  No signs of infection at this time, but has COPD exacerbation now   Allergies  Allergen Reactions  . 2,4-D Dimethylamine (Amisol) Itching    (Pt unsure of this allergy)  . Morphine And Related Itching  . Vicodin [Hydrocodone-Acetaminophen] Nausea And Vomiting    GI distress    REVIEW OF SYSTEMS:   Constitutional: Negative for fever, chills, weight loss, +malaise/fatigue  HENT: Negative for hearing loss, ear pain, nosebleeds, congestion, sore throat, neck pain, tinnitus and ear discharge.   Eyes: Negative for blurred vision, double vision, photophobia, pain, discharge and redness.  Respiratory: Negative for cough, hemoptysis, sputum production, shortness of breath, wheezing and stridor.   Cardiovascular: Negative for chest pain, palpitations, orthopnea, claudication, leg swelling and PND.  Gastrointestinal: Negative for heartburn, nausea, vomiting, abdominal pain, diarrhea, constipation, blood in stool and melena.  Endo/Heme/Allergies: Negative for environmental allergies and polydipsia. Does not bruise/bleed easily.    VITAL SIGNS: BP 108/82 (BP Location: Left Arm, Cuff Size: Normal)   Pulse 93   Ht 5\' 6"  (1.676 m)   Wt 133 lb (60.3 kg)   SpO2 95%   BMI 21.47 kg/m   Physical Examination:  GENERAL:NAD, no fevers, chills, no  weakness no fatigue HEAD: Normocephalic, atraumatic.  EYES: Pupils equal, round, reactive to light. Extraocular muscles intact. No scleral icterus.  MOUTH: Moist mucosal membrane. Dentition intact. No abscess noted.  EAR, NOSE, THROAT: Clear without exudates. No external lesions.  NECK: Supple. No thyromegaly. No nodules. No JVD.  PULMONARY:  +wheezes CARDIOVASCULAR: S1 and S2. Regular rate and rhythm. No murmurs, rubs, or gallops. No edema. Pedal pulses 2+ bilaterally.  ALL OTHER ROS ARE NEGATIVE      ASSESSMENT / PLAN: 51 yo white female with mild/moderate COPD stage D with COPD-seems to be controlled at this time No signs of acute exacerbation at this time Continue Spiriva as prescribed Continue advair CT chest +for emphysema Smoking cessation strongly advised   Patient/Family are satisfied with Plan of action and management. All questions answered Follow-up in 6 months   Maleeka Sabatino [name], M.D.  [name]  Medical Director [place] Director [facility] Cardio-Pulmonary Department

## 2017-08-28 ENCOUNTER — Other Ambulatory Visit: Payer: Self-pay | Admitting: Family Medicine

## 2017-08-28 DIAGNOSIS — Z720 Tobacco use: Secondary | ICD-10-CM

## 2017-08-28 DIAGNOSIS — Z716 Tobacco abuse counseling: Secondary | ICD-10-CM

## 2017-09-22 ENCOUNTER — Ambulatory Visit (INDEPENDENT_AMBULATORY_CARE_PROVIDER_SITE_OTHER): Payer: Self-pay | Admitting: Family Medicine

## 2017-09-22 ENCOUNTER — Encounter: Payer: Self-pay | Admitting: Family Medicine

## 2017-09-22 VITALS — BP 118/75 | HR 98 | Temp 98.4°F | Ht 66.0 in | Wt 134.0 lb

## 2017-09-22 DIAGNOSIS — B370 Candidal stomatitis: Secondary | ICD-10-CM

## 2017-09-22 MED ORDER — MAGIC MOUTHWASH W/LIDOCAINE: 5.0000 mL | Freq: Three times a day (TID) | ORAL | 0 refills | Status: DC | PRN

## 2017-09-22 MED ORDER — FLUCONAZOLE 200 MG PO TABS: ORAL_TABLET | ORAL | 3 refills | Status: DC

## 2017-09-22 NOTE — Patient Instructions (Addendum)
Thank you for coming to the clinic today.  1. Try to keep keep washing mouth out with water after use of inhalers  Go ahead and start Magic Mouthwash, see details below  May take Diflucan as needed in 2-3 days if not improved, keep on file for future episodes  How to Write a Magic Mouthwash Prescription:  1 Part viscous lidocaine 2%   1 Part Maalox   1 Part diphenhydramine 12.5 mg per 5 ml elixir Quantity: 120 ml Sig: Swish, gargle, and spit one to two teaspoonfuls every six hours as needed. Shake well before using.  Can add 1 part Nystatin for Thrush  Please schedule a Follow-up Appointment to: Return in about 3 months (around 12/23/2017) for COPD / Smoking.  If you have any other questions or concerns, please feel free to call the clinic or send a message through [place]. You may also schedule an earlier appointment if necessary.  Additionally, you may be receiving a survey about your experience at our clinic within a few days to 1 week by e-mail or mail. We value your feedback.  [name], DO [place]

## 2017-09-22 NOTE — Progress Notes (Signed)
Subjective:    Patient ID: Caroline Conley, female    DOB: 1966/04/01, 51 y.o.   MRN: [id number]  Caroline Conley is a 51 y.o. female presenting on 09/22/2017 for Thrush (x 3 days. pt admits using her inhaler )  Patient presents for a same day appointment.  HPI   Oral Thrush - Reports symptoms of oral thrush or yeast infection onset within past 2-3 days, gradual worse without improvement. She has had this similarly before due to steroid inhaler, usually improves with Diflucan 276m, takes one if early onset symptoms and it resolves usually. May take this 1-3 times monthly or less often if limited flares. - States she is currently out of Diflucan, needs refill - Admits some sore mouth and throat, requesting magic mouthwash rx, has helped before - Denies any fevers/chills, cough, congestion, sinus drainage or pressure, ear pain, nausea vomiting  Health Maintenance: - UTD Flu Shot  [facility] 08/06/2017 06/07/2016  Decreased Interest 0 0  Down, Depressed, Hopeless 0 0  PHQ - 2 Score 0 0    Social History  Substance Use Topics  . Smoking status: Former Smoker    Packs/day: 0.50    Years: 30.00    Types: Cigarettes    Quit date: 08/04/2017  . 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 Alcohol use No    Review of Systems Per HPI unless specifically indicated above     Objective:    BP 118/75 (BP Location: Right Arm, Patient Position: Sitting, Cuff Size: Normal)   Pulse 98   Temp 98.4 F (36.9 C) (Oral)   Ht '5\' 6"'  (1.676 m)   Wt 134 lb (60.8 kg)   SpO2 97%   BMI 21.63 kg/m   Wt Readings from Last 3 Encounters:  09/22/17 134 lb (60.8 kg)  08/27/17 133 lb (60.3 kg)  08/06/17 130 lb (59 kg)    Physical Exam  Constitutional: She appears well-developed and well-nourished. No distress.  Well-appearing, comfortable, cooperative  HENT:  Head: Normocephalic and atraumatic.  Mouth/Throat: Oropharynx is clear and moist.  Oropharynx with significant amount of patchy white  material on most of tongue and several areas oropharynx and posterior aspect, consistent with oral candidiasis, without erythema, tonsillar exudate, edema or asymmetry.  Eyes: Conjunctivae are normal. Right eye exhibits no discharge. Left eye exhibits no discharge.  Neck: Normal range of motion. Neck supple.  Cardiovascular: Normal rate.   Pulmonary/Chest: Effort normal.  Lymphadenopathy:    She has no cervical adenopathy.  Neurological: She is alert.  Skin: Skin is warm and dry. No rash noted. She is not diaphoretic. No erythema.  Psychiatric: Her behavior is normal.  Nursing note and vitals reviewed.    Results for orders placed or performed in visit on 07/30/17  COMPLETE METABOLIC PANEL WITH GFR  Result Value Ref Range   Sodium 140 135 - 146 mmol/L   Potassium 3.7 3.5 - 5.3 mmol/L   Chloride 105 98 - 110 mmol/L   CO2 25 20 - 32 mmol/L   Glucose, Bld 126 (H) 65 - 99 mg/dL   BUN 6 (L) 7 - 25 mg/dL   Creat 1.04 0.50 - 1.05 mg/dL   Total Bilirubin 0.4 0.2 - 1.2 mg/dL   Alkaline Phosphatase 109 33 - 130 U/L   AST 16 10 - 35 U/L   ALT 16 6 - 29 U/L   Total Protein 6.1 6.1 - 8.1 g/dL   Albumin 3.7 3.6 - 5.1 g/dL   Calcium  9.3 8.6 - 10.4 mg/dL   GFR, Est African American 72 >=60 mL/min   GFR, Est Non African American 62 >=60 mL/min  Lipid panel  Result Value Ref Range   Cholesterol 169 <200 mg/dL   Triglycerides 243 (H) <150 mg/dL   HDL 35 (L) >50 mg/dL   Total CHOL/HDL Ratio 4.8 <5.0 Ratio   VLDL 49 (H) <30 mg/dL   LDL Cholesterol 85 <100 mg/dL  CBC with Differential/Platelet  Result Value Ref Range   WBC 9.9 3.8 - 10.8 K/uL   RBC 4.16 3.80 - 5.10 MIL/uL   Hemoglobin 13.4 11.7 - 15.5 g/dL   HCT 39.9 35.0 - 45.0 %   MCV 95.9 80.0 - 100.0 fL   MCH 32.2 27.0 - 33.0 pg   MCHC 33.6 32.0 - 36.0 g/dL   RDW 12.9 11.0 - 15.0 %   Platelets 266 140 - 400 K/uL   MPV 9.1 7.5 - 12.5 fL   Neutro Abs 6,732 1,500 - 7,800 cells/uL   Lymphs Abs 2,376 850 - 3,900 cells/uL   Monocytes  Absolute 594 200 - 950 cells/uL   Eosinophils Absolute 198 15 - 500 cells/uL   Basophils Absolute 0 0 - 200 cells/uL   Neutrophils Relative % 68 %   Lymphocytes Relative 24 %   Monocytes Relative 6 %   Eosinophils Relative 2 %   Basophils Relative 0 %   Smear Review Criteria for review not met   Hemoglobin A1c  Result Value Ref Range   Hgb A1c MFr Bld 4.9 <5.7 %   Mean Plasma Glucose 94 mg/dL      Assessment & Plan:   Problem List Items Addressed This Visit    None    Visit Diagnoses    Oral yeast infection    -  Primary Clinically consistent with oral thrush, secondary to steroid inhalers Reviewed proper inhaler use and washing mouth out after use to reduce incidence of thrush Order [facility] Magic Mouthwash, printed rx, use 3-4 times daily PRN for several days for now Refilled Diflucan 251m once daily PRN may repeat in 48 hours if need, added refills Follow-up as needed    Relevant Medications   magic mouthwash w/lidocaine SOLN   fluconazole (DIFLUCAN) 200 MG tablet      Meds ordered this encounter  Medications  . magic mouthwash w/lidocaine SOLN    Sig: Take 5 mLs by mouth 3 (three) times daily as needed for mouth pain.    Dispense:  120 mL    Refill:  0    [facility] Magic Mouthwash formulation. Or may follow instructions 1 Part viscous lidocaine 2%, 1 Part Maalox, 1 Part diphenhydramine 12.5 mg per 5 ml elixir  . fluconazole (DIFLUCAN) 200 MG tablet    Sig: TAKE 1 TABLET BY MOUTH ONCE AS NEEDED FOR YEAST  MAY REPEAT DOSE IN 48 HOURS    Dispense:  5 tablet    Refill:  3    Follow up plan: Return in about 3 months (around 12/23/2017) for COPD / Smoking.  ANobie Putnam D[facility]fordMedical [facility] 09/22/2017, 11:53 AM

## 2017-10-13 ENCOUNTER — Ambulatory Visit (INDEPENDENT_AMBULATORY_CARE_PROVIDER_SITE_OTHER): Payer: Medicaid Other | Admitting: Family Medicine

## 2017-10-13 ENCOUNTER — Encounter: Payer: Self-pay | Admitting: Family Medicine

## 2017-10-13 VITALS — BP 147/76 | HR 85 | Temp 97.8°F | Resp 16 | Ht 66.0 in | Wt 132.0 lb

## 2017-10-13 DIAGNOSIS — J410 Simple chronic bronchitis: Secondary | ICD-10-CM | POA: Diagnosis not present

## 2017-10-13 DIAGNOSIS — B370 Candidal stomatitis: Secondary | ICD-10-CM

## 2017-10-13 DIAGNOSIS — J441 Chronic obstructive pulmonary disease with (acute) exacerbation: Secondary | ICD-10-CM

## 2017-10-13 DIAGNOSIS — Z85820 Personal history of malignant melanoma of skin: Secondary | ICD-10-CM

## 2017-10-13 MED ORDER — MAGIC MOUTHWASH W/LIDOCAINE: 5.0000 mL | Freq: Three times a day (TID) | ORAL | 0 refills | Status: DC | PRN

## 2017-10-13 MED ORDER — PREDNISONE 50 MG PO TABS: 50.0000 mg | ORAL_TABLET | Freq: Every day | ORAL | 0 refills | Status: DC

## 2017-10-13 MED ORDER — AZITHROMYCIN 250 MG PO TABS: ORAL_TABLET | ORAL | 0 refills | Status: DC

## 2017-10-13 NOTE — Progress Notes (Signed)
Subjective:    Patient ID: Caroline Conley, female    DOB: May 07, 1966, 51 y.o.   MRN: [id number]  Caroline Conley is a 51 y.o. female presenting on 10/13/2017 for Cough (onset 3 days, SOB)  Patient presents for a same day appointment.  HPI   ACUTE COPD / BRONCHITIS - Followed by [facility] - Reports symptoms started worsening over past 3 days with worsening productive cough, thicker sputum production - Last time treated AECOPD 07/28/17 - Levaquin 571m daily x 7 days, Prednisone 535mx 5 days and Tussionex cough syrup. Had prior recurrent oral thrush due to inhalers - Cannot take mucinex nausea, limited relief - Improved oral thrush with Advair, Spiriva, Albuterol - recurrence of thrush again with burning, needs more [facility] - Resumed smoking, see below - Denies any fevers/chills, sweats, chest pain, worsening dyspnea  FORMER SMOKER / History of Tobacco Abuse - Last visit with me 08/06/17, for annual physical and smoking cessation, treated with NRT patches to set goal for quitting, see prior notes for background information. - Interval update with previously quit in 08/04/17, and then since resumed smoking at lower amount now - Today patient reports still smoking down to 0.2530mpd, down from half ppd, no longer on NRT, - never successfully quit in past, failed Chantix, Wellbutrin, NRT gum and patch and NRT nasal spray.  Health Maintenance: - UTD Flu Shot  Depression screen [facility] 08/06/2017 06/07/2016  Decreased Interest 0 0  Down, Depressed, Hopeless 0 0  PHQ - 2 Score 0 0    Social History  Substance Use Topics  . Smoking status: Current Every Day Smoker    Packs/day: 0.25    Years: 30.00    Types: Cigarettes    Last attempt to quit: 08/04/2017  . 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 Alcohol use No    Review of Systems Per HPI unless specifically indicated above     Objective:    BP (!) 147/76   Pulse 85   Temp 97.8 F (36.6  C) (Oral)   Resp 16   Ht '5\' 6"'  (1.676 m)   Wt 132 lb (59.9 kg)   SpO2 100%   BMI 21.31 kg/m   Wt Readings from Last 3 Encounters:  10/13/17 132 lb (59.9 kg)  09/22/17 134 lb (60.8 kg)  08/27/17 133 lb (60.3 kg)    Physical Exam  Constitutional: She is oriented to person, place, and time. She appears well-developed and well-nourished. No distress.  Mildly tired appearing but mostly well, comfortable, cooperative  HENT:  Head: Normocephalic and atraumatic.  Mouth/Throat: Oropharynx is clear and moist.  No evidence of worsening oral thrush today, some residual lesions.  Frontal / maxillary sinuses non-tender. Nares patent without purulence or edema. Bilateral TMs clear without erythema, effusion or bulging. Oropharynx clear without erythema, exudates, edema or asymmetry.  Eyes: Conjunctivae are normal. Right eye exhibits no discharge. Left eye exhibits no discharge.  Neck: Normal range of motion. Neck supple.  Cardiovascular: Normal rate, regular rhythm, normal heart sounds and intact distal pulses.   No murmur heard. Pulmonary/Chest: Effort normal. No respiratory distress. She has wheezes. She has rales.  Diffuse coarse breath sounds and expiratory wheezing. With occasional coughing. Reduced air movement diffusely.  Musculoskeletal: She exhibits no edema.  Lymphadenopathy:    She has no cervical adenopathy.  Neurological: She is alert and oriented to person, place, and time.  Skin: Skin is  warm and dry. No rash noted. She is not diaphoretic. No erythema.  Healed surgical excision scar from derm skin cancer left lateral neck and left lower abdomen  Psychiatric: Her behavior is normal.  Nursing note and vitals reviewed.  Results for orders placed or performed in visit on 07/30/17  COMPLETE METABOLIC PANEL WITH GFR  Result Value Ref Range   Sodium 140 135 - 146 mmol/L   Potassium 3.7 3.5 - 5.3 mmol/L   Chloride 105 98 - 110 mmol/L   CO2 25 20 - 32 mmol/L   Glucose, Bld 126 (H)  65 - 99 mg/dL   BUN 6 (L) 7 - 25 mg/dL   Creat 1.04 0.50 - 1.05 mg/dL   Total Bilirubin 0.4 0.2 - 1.2 mg/dL   Alkaline Phosphatase 109 33 - 130 U/L   AST 16 10 - 35 U/L   ALT 16 6 - 29 U/L   Total Protein 6.1 6.1 - 8.1 g/dL   Albumin 3.7 3.6 - 5.1 g/dL   Calcium 9.3 8.6 - 10.4 mg/dL   GFR, Est African American 72 >=60 mL/min   GFR, Est Non African American 62 >=60 mL/min  Lipid panel  Result Value Ref Range   Cholesterol 169 <200 mg/dL   Triglycerides 243 (H) <150 mg/dL   HDL 35 (L) >50 mg/dL   Total CHOL/HDL Ratio 4.8 <5.0 Ratio   VLDL 49 (H) <30 mg/dL   LDL Cholesterol 85 <100 mg/dL  CBC with Differential/Platelet  Result Value Ref Range   WBC 9.9 3.8 - 10.8 K/uL   RBC 4.16 3.80 - 5.10 MIL/uL   Hemoglobin 13.4 11.7 - 15.5 g/dL   HCT 39.9 35.0 - 45.0 %   MCV 95.9 80.0 - 100.0 fL   MCH 32.2 27.0 - 33.0 pg   MCHC 33.6 32.0 - 36.0 g/dL   RDW 12.9 11.0 - 15.0 %   Platelets 266 140 - 400 K/uL   MPV 9.1 7.5 - 12.5 fL   Neutro Abs 6,732 1,500 - 7,800 cells/uL   Lymphs Abs 2,376 850 - 3,900 cells/uL   Monocytes Absolute 594 200 - 950 cells/uL   Eosinophils Absolute 198 15 - 500 cells/uL   Basophils Absolute 0 0 - 200 cells/uL   Neutrophils Relative % 68 %   Lymphocytes Relative 24 %   Monocytes Relative 6 %   Eosinophils Relative 2 %   Basophils Relative 0 %   Smear Review Criteria for review not met   Hemoglobin A1c  Result Value Ref Range   Hgb A1c MFr Bld 4.9 <5.7 %   Mean Plasma Glucose 94 mg/dL      Assessment & Plan:   Problem List Items Addressed This Visit    COPD (chronic obstructive pulmonary disease) (HCC) - Primary   Relevant Medications   magic mouthwash w/lidocaine SOLN   predniSONE (DELTASONE) 50 MG tablet   azithromycin (ZITHROMAX Z-PAK) 250 MG tablet   Personal history of malignant melanoma    Other Visit Diagnoses    Oral yeast infection     - Repeat rx [facility] Magic Mouthwash for recurrent oral thrush on inhalers    Relevant Medications    magic mouthwash w/lidocaine SOLN   azithromycin (ZITHROMAX Z-PAK) 250 MG tablet   COPD with acute exacerbation (HCC)      Consistent with mild early acute exacerbation of COPD with worsening productive cough. Similar to prior exacerbations, last 07/28/17. - No hypoxia (100% on RA), afebrile, no recent hospitalization - Continues Advair, Spiriva -  Quit smoking 08/04/17 for temporary 1-2 months, now resumed  Plan: 1. Start Prednisone 51m x 5 day steroid burst 2. Use albuterol q 4 hr regularly x 2-3 days. Continue maintenance inhalers 3. Considered antibiotics, however afebrile and no hypoxia, would not be indicated in mild AECOPD, unless recurrent or not improved on steroids - also given precaution rx printed Azithromycin z pak to fill within 48 hours if not improving or any worsening 4. RTC about 1 week if not improving, otherwise strict return criteria to go to ED     Relevant Medications   magic mouthwash w/lidocaine SOLN   predniSONE (DELTASONE) 50 MG tablet   azithromycin (ZITHROMAX Z-PAK) 250 MG tablet      Meds ordered this encounter  Medications  . magic mouthwash w/lidocaine SOLN    Sig: Take 5 mLs by mouth 3 (three) times daily as needed for mouth pain.    Dispense:  120 mL    Refill:  0    [facility] Magic Mouthwash formulation. Or may follow instructions 1 Part viscous lidocaine 2%, 1 Part Maalox, 1 Part diphenhydramine 12.5 mg per 5 ml elixir  . predniSONE (DELTASONE) 50 MG tablet    Sig: Take 1 tablet (50 mg total) by mouth daily with breakfast.    Dispense:  5 tablet    Refill:  0  . azithromycin (ZITHROMAX Z-PAK) 250 MG tablet    Sig: Take 2 tabs (5071mtotal) on Day 1. Take 1 tab (25019mdaily for next 4 days.    Dispense:  6 tablet    Refill:  0    Follow up plan: Return in about 1 week (around 10/20/2017), or if symptoms worsen or fail to improve, for COPD.  AleNobie PutnamO [facility] 10/13/2017, 11:33 PM

## 2017-10-13 NOTE — Patient Instructions (Addendum)
Thank you for coming to the clinic today.  1. It sounds like you had an Upper Respiratory Virus that has settled into a Bronchitis, lower respiratory tract infection. I don't have concerns for pneumonia today, and think that this should gradually improve. Once you are feeling better, the cough may take a few weeks to fully resolve. I do hear wheezing and coarse breath sounds sounds like COPD  - Start Prednisone 50mg  daily for next 5 days - this will open up lungs allow you to breath better and treat that wheezing or bronchospasm  HOLD antibiotics today - IF not improving after 24-48 hours, or develop worsening fever, productive cough then start Azithromycin Z pak (antibiotic) 2 tabs day 1, then 1 tab x 4 days, complete entire course even if improved  Continue routine inhalers and nebulizer as prescribed  - Use nasal saline (Simply Saline or Ocean Spray) to flush nasal congestion multiple times a day, may help cough - Drink plenty of fluids to improve congestion  If your symptoms seem to worsen instead of improve over next several days, including significant fever / chills, worsening shortness of breath, worsening wheezing, or nausea / vomiting and can't take medicines - return sooner or go to hospital Emergency Department for more immediate treatment.  Please schedule a Follow-up Appointment to: Return in about 1 week (around 10/20/2017), or if symptoms worsen or fail to improve, for COPD.  If you have any other questions or concerns, please feel free to call the clinic or send a message through [place]. You may also schedule an earlier appointment if necessary.  Additionally, you may be receiving a survey about your experience at our clinic within a few days to 1 week by e-mail or mail. We value your feedback.  [name], DO [place]

## 2017-10-20 ENCOUNTER — Ambulatory Visit (INDEPENDENT_AMBULATORY_CARE_PROVIDER_SITE_OTHER): Payer: Medicaid Other | Admitting: Family Medicine

## 2017-10-20 ENCOUNTER — Encounter: Payer: Self-pay | Admitting: Family Medicine

## 2017-10-20 VITALS — BP 143/77 | HR 104 | Temp 98.4°F | Resp 16 | Ht 66.0 in | Wt 132.0 lb

## 2017-10-20 DIAGNOSIS — J410 Simple chronic bronchitis: Secondary | ICD-10-CM

## 2017-10-20 DIAGNOSIS — R52 Pain, unspecified: Secondary | ICD-10-CM | POA: Diagnosis not present

## 2017-10-20 DIAGNOSIS — J111 Influenza due to unidentified influenza virus with other respiratory manifestations: Secondary | ICD-10-CM

## 2017-10-20 DIAGNOSIS — R509 Fever, unspecified: Secondary | ICD-10-CM | POA: Diagnosis not present

## 2017-10-20 LAB — POCT INFLUENZA A/B
INFLUENZA A, POC: NEGATIVE
INFLUENZA B, POC: NEGATIVE

## 2017-10-20 MED ORDER — OSELTAMIVIR PHOSPHATE 75 MG PO CAPS
75.0000 mg | ORAL_CAPSULE | Freq: Two times a day (BID) | ORAL | 0 refills | Status: DC
Start: 2017-10-20 — End: 2017-10-20

## 2017-10-20 MED ORDER — OSELTAMIVIR PHOSPHATE 75 MG PO CAPS: 75.0000 mg | ORAL_CAPSULE | Freq: Two times a day (BID) | ORAL | 0 refills | Status: DC

## 2017-10-20 NOTE — Addendum Note (Signed)
Addended by: Olin Hauser on: 10/20/2017 03:10 PM   Modules accepted: Orders

## 2017-10-20 NOTE — Progress Notes (Signed)
Subjective:    Patient ID: Caroline Conley, female    DOB: [birth year], 51 y.o.   MRN: [id number]  Caroline Conley is a 50 y.o. female presenting on 10/20/2017 for COPD (HA facial pain worst today cough making throat hurts and getting worst)  Patient presents for a same day appointment.  HPI  Flu Like Illness / Presumed Influenza / History of COPD Exac - Last visit with me 10/13/17, for similar symptoms with COPD exac vs bronchitis at that time, treated with Prednisone and Azithromcyin Z pak, see prior notes for background information. - Interval update with dramatic improvement about 3-4 days into treatment mostly back to 80-85% normal with improved breathing, and then significant worsening onset this AM - Today patient reports now new concern with additional symptoms with myalgias body aches, and subjective fever and chills, still productive cough but thicker sputum - Also admits sore mouth still with recent thrush, currently back on Magic Mouthwash - Admits sore throat, cough (productive), persistent wheezing and dyspnea - Denies nausea vomiting, abdominal pain, diarrhea, sinus pressure or pain ear pain or pressure  PMH - Tobacco Abuse  Health Maintenance: - UTD Flu Shot  Depression screen [facility] 2/9 08/06/2017 06/07/2016  Decreased Interest 0 0  Down, Depressed, Hopeless 0 0  PHQ - 2 Score 0 0    Social History   Tobacco Use  . Smoking status: Current Every Day Smoker    Packs/day: 0.25    Years: 30.00    Pack years: 7.50    Types: Cigarettes    Last attempt to quit: 08/04/2017    Years since quitting: 0.2  . Smokeless tobacco: Former Systems developer  . Tobacco comment: 0.5 to 1 ppd >30 years  Substance Use Topics  . Alcohol use: No  . Drug use: No    Review of Systems Per HPI unless specifically indicated above     Objective:    BP (!) 143/77 (BP Location: Right Arm, Patient Position: Sitting, Cuff Size: Normal)   Pulse (!) 104   Temp 98.4 F (36.9 C) (Oral)   Resp 16   Ht  5\' 6"  (1.676 m)   Wt 132 lb (59.9 kg)   SpO2 99%   BMI 21.31 kg/m   Wt Readings from Last 3 Encounters:  10/20/17 132 lb (59.9 kg)  10/13/17 132 lb (59.9 kg)  09/22/17 134 lb (60.8 kg)    Physical Exam  Constitutional: She is oriented to person, place, and time. She appears well-developed and well-nourished. No distress.  Mildly tired appearing but mostly well, comfortable, cooperative  HENT:  Head: Normocephalic and atraumatic.  Mouth/Throat: Oropharynx is clear and moist.  No evidence of worsening oral thrush today, mostly resolved  Frontal / maxillary sinuses non-tender. Nares patent without purulence or edema. Oropharynx clear without erythema, exudates, edema or asymmetry.  Eyes: Conjunctivae are normal. Right eye exhibits no discharge. Left eye exhibits no discharge.  Neck: Normal range of motion. Neck supple.  Cardiovascular: Normal rate, regular rhythm, normal heart sounds and intact distal pulses.  No murmur heard. Pulmonary/Chest: Effort normal. No respiratory distress. She has wheezes (Improved but persistent diffuse end exp wheezing). She has no rales (Resolved coarse breath sounds).  Improved but occasional coughing. Improved air movement diffusely.  Musculoskeletal: She exhibits no edema.  Lymphadenopathy:    She has no cervical adenopathy.  Neurological: She is alert and oriented to person, place, and time.  Skin: Skin is warm and dry. No rash noted. She is not diaphoretic.  No erythema.  Nursing note and vitals reviewed.  Results for orders placed or performed in visit on 10/20/17  POCT Influenza A/B  Result Value Ref Range   Influenza A, POC Negative Negative   Influenza B, POC Negative Negative      Assessment & Plan:   Problem List Items Addressed This Visit    COPD (chronic obstructive pulmonary disease) ([place])    Other Visit Diagnoses    Influenza    -  Primary  Clinically diagnosed influenza despite negative rapid flu test today, concern for flu due  to acute worsening after improved, second sickening now with more generalized viral symptoms fever/myalgias, history of flu in past feels similar and prior complication with pneumonia, high risk COPD patient - Duration < 1 days, without complication. Tolerating PO and well hydrated - COPD exac is resolving and clinically improved today, improved resp status - S/p influenza vaccine this season  Plan: 1. Start empiric Tamiflu 75mg  capsules BID x 5 days 2. Supportive care as advised with NSAID / Tylenol PRN fever/myalgias, improve hydration, may take OTC Cold/Flu meds 3. Return criteria given if significant worsening, consider post-influenza complications, or if COPD not improved or worse again can return 2-3 days for Chest X-ray would consider repeat course of antibiotics for Levaquin, otherwise follow-up if needed - Advised her to schedule with [facility] for routine pulm follow-up discuss inhalers, oral thrush and may repeat CXR    Relevant Medications   oseltamivir (TAMIFLU) 75 MG capsule   Other Relevant Orders   POCT Influenza A/B   Generalized body aches       Relevant Orders   POCT Influenza A/B   Fever, unspecified fever cause       Relevant Orders   POCT Influenza A/B      Meds ordered this encounter  Medications  . oseltamivir (TAMIFLU) 75 MG capsule    Sig: Take 1 capsule (75 mg total) 2 (two) times daily by mouth. For 5 days    Dispense:  10 capsule    Refill:  0    Follow up plan: Return in about 1 week (around 10/27/2017), or if symptoms worsen or fail to improve, for COPD.  [name], [facility] 10/20/2017, 11:41 AM

## 2017-10-20 NOTE — Patient Instructions (Addendum)
Thank you for coming to the clinic today.  1. Your flu test was NEGATIVE, this is not 100% though, and you can still have the flu with a negative test, otherwise it could be a different viral syndrome.  - Start Tamiflu (anti-flu medicine) take one capsule 75mg  twice a day for 5 days - Wash hands and cover cough very well to avoid spread of infection - For symptom control:      - Take Ibuprofen / Advil 400-600mg  every 6-8 hours as needed for fever / muscle aches, and may also take Tylenol 500-1000mg  per dose every 6-8 hours or 3 times a day, can alternate dosing  Continue other medicines as needed for symptoms  - Improve hydration with plenty of clear fluids  If significant worsening with poor fluid intake, worsening fever, difficulty breathing due to coughing, worsening body aches, weakness, or other more concerning symptoms difficulty breathing you can seek treatment at Emergency Department. Also if improved flu symptoms and then worsening days to week later with concerns for bronchitis, productive cough fever chills again we may need to check for possible pneumonia that can occur after the flu  Please schedule a follow-up appointment with Dr. Parks Ranger in 1-2 weeks as needed if worsening from Flu / Bronchitis may need Chest X-ray  If you have any other questions or concerns, please feel free to call the clinic or send a message through [facility]. You may also schedule an earlier appointment if necessary.  Additionally, you may be receiving a survey about your experience at our clinic within a few days to 1 week by e-mail or mail. We value your feedback.  [name], DO [place]

## 2017-10-23 ENCOUNTER — Telehealth: Payer: Self-pay | Admitting: Internal Medicine

## 2017-10-23 NOTE — Telephone Encounter (Signed)
Patient calling to be seen asap for a copd flare  Inhalers are making mouth raw and dukes mouth wash is not completley working  Scheduled with kasa 11/13 at 10 am

## 2017-10-23 NOTE — Telephone Encounter (Signed)
Attempted to call patient to let her know since we do not have any appointments, Pt needs to either call pcp or go to urgent care if she is having copd exacerbation.

## 2017-10-24 ENCOUNTER — Ambulatory Visit (INDEPENDENT_AMBULATORY_CARE_PROVIDER_SITE_OTHER): Payer: Medicaid Other

## 2017-10-24 ENCOUNTER — Ambulatory Visit: Payer: Medicaid Other | Admitting: Podiatry

## 2017-10-24 ENCOUNTER — Encounter: Payer: Self-pay | Admitting: Podiatry

## 2017-10-24 DIAGNOSIS — M7752 Other enthesopathy of left foot: Secondary | ICD-10-CM

## 2017-10-24 DIAGNOSIS — M779 Enthesopathy, unspecified: Principal | ICD-10-CM

## 2017-10-24 DIAGNOSIS — M2012 Hallux valgus (acquired), left foot: Secondary | ICD-10-CM | POA: Diagnosis not present

## 2017-10-24 DIAGNOSIS — M778 Other enthesopathies, not elsewhere classified: Secondary | ICD-10-CM

## 2017-10-24 MED ORDER — TRIAMCINOLONE ACETONIDE 10 MG/ML IJ SUSP
10.0000 mg | Freq: Once | INTRAMUSCULAR | Status: AC
  Administered 2017-10-24: 10 mg

## 2017-10-24 NOTE — Telephone Encounter (Signed)
Called patient and made aware she needs to go to urgent care if copd gets worse. She says her pcp has her on some abx and she thinks she'll be ok till 10/28/17. Nothing further needed.

## 2017-10-28 ENCOUNTER — Encounter: Payer: Self-pay | Admitting: Internal Medicine

## 2017-10-28 ENCOUNTER — Ambulatory Visit (INDEPENDENT_AMBULATORY_CARE_PROVIDER_SITE_OTHER): Payer: Medicaid Other | Admitting: Internal Medicine

## 2017-10-28 VITALS — BP 130/72 | HR 95 | Ht 66.0 in | Wt 130.0 lb

## 2017-10-28 DIAGNOSIS — J441 Chronic obstructive pulmonary disease with (acute) exacerbation: Secondary | ICD-10-CM

## 2017-10-28 MED ORDER — PREDNISONE 20 MG PO TABS: 40.0000 mg | ORAL_TABLET | Freq: Every day | ORAL | 0 refills | Status: DC

## 2017-10-28 MED ORDER — BUDESONIDE-FORMOTEROL FUMARATE 160-4.5 MCG/ACT IN AERO: 2.0000 | INHALATION_SPRAY | Freq: Two times a day (BID) | RESPIRATORY_TRACT | 12 refills | Status: DC

## 2017-10-28 MED ORDER — LEVOFLOXACIN 500 MG PO TABS: 500.0000 mg | ORAL_TABLET | Freq: Every day | ORAL | 0 refills | Status: AC

## 2017-10-28 NOTE — Progress Notes (Signed)
   Name: Caroline Conley MRN: [id number] DOB: 11/08/1966    CONSULTATION DATE:  10/28/2017   REFERRING MD : [name]  CC: COPD evaluation  Previous PFT 2017 ratio 18%, Fev1 2.2L 74% fef25/75 0.78L 22%  HISTORY OF PRESENT ILLNESS: 51 yo WF with dx fo COPD Chronic SOB and DOE  CT of the chest reviewed with patient and family there is no lung masses noted however there is areas of emphysema in the upper lobe region bilaterally  Still smokes, 2 ppd for 40 years Uses advair, spiriva Uses albuteol nebs and inhaler as needed +sick contact +productive cough +wheezing   has COPD exacerbation now   Allergies  Allergen Reactions  . 2,4-D Dimethylamine (Amisol) Itching    (Pt unsure of this allergy)  . Morphine And Related Itching  . Vicodin [Hydrocodone-Acetaminophen] Nausea And Vomiting    GI distress    REVIEW OF SYSTEMS:   Constitutional: Negative for fever, chills, weight loss, +malaise/fatigue  HENT: Negative for hearing loss, ear pain, nosebleeds, congestion, sore throat, neck pain, tinnitus and ear discharge.   Eyes: Negative for blurred vision, double vision, photophobia, pain, discharge and redness.  Respiratory:+cough, -hemoptysis, +sputum production, +shortness of breath, +wheezing and -stridor.   Cardiovascular: Negative for chest pain, palpitations, orthopnea, claudication, leg swelling and PND.  Gastrointestinal: Negative for heartburn, nausea, vomiting, abdominal pain, diarrhea, constipation, blood in stool and melena.  Endo/Heme/Allergies: Negative for environmental allergies and polydipsia. Does not bruise/bleed easily. See HPI    VITAL SIGNS: BP 130/72 (BP Location: Left Arm, Cuff Size: Normal)   Pulse 95   Ht 5\' 6"  (1.676 m)   Wt 130 lb (59 kg)   SpO2 94%   BMI 20.98 kg/m   Physical Examination:  GENERAL:NAD, no fevers, chills, no weakness no fatigue HEAD: Normocephalic, atraumatic.  EYES: Pupils equal, round, reactive to light. Extraocular  muscles intact. No scleral icterus.  MOUTH: Moist mucosal membrane. Dentition intact. No abscess noted.  EAR, NOSE, THROAT: Clear without exudates. No external lesions.  NECK: Supple. No thyromegaly. No nodules. No JVD.  PULMONARY:  +wheezes CARDIOVASCULAR: S1 and S2. Regular rate and rhythm. No murmurs, rubs, or gallops. No edema. Pedal pulses 2+ bilaterally.  ALL OTHER ROS ARE NEGATIVE  ASSESSMENT / PLAN: 51 yo white female with mild/moderate COPD stage D with COPD-seems to be controlled at this time +COPD exacerbation at this time   Continue Spiriva as prescribed Change advair to Symbicort due to hoarse voice CT chest +for emphysema Smoking cessation strongly advised  COPD exacerbation -prednisone and abx therapy    All questions answered Follow-up in 3 months   [name] [name], M.D.  [name]  Medical Director [place] Director [facility] Cardio-Pulmonary Department

## 2017-10-28 NOTE — Patient Instructions (Addendum)
Start Symbicort  Prednisone 40 mg daily for 10 days Levaquin 500 daily   STOP SMOKING!!!  Follow up in 3 months

## 2017-10-29 NOTE — Progress Notes (Signed)
Subjective:    Patient ID: Caroline Conley, female   DOB: 51 y.o.   MRN: [id number]   HPI patient states that she had surgery before and that she's had pain that's been more recent duration. States the bunion itself which was fixed seems to be doing well but she has developed discomfort    ROS      Objective:  Physical Exam neurovascular status intact with inflammatory changes noted around the second MPJ left with bunion site itself healing well from previous surgery with good range of motion no crepitus and good alignment     Assessment:    Inflammatory capsulitis third MPJ left with well-healing surgical site left first MPJ with mild pain upon motion     Plan:    H&P and reviewed x-rays. At this time I did a proximal nerve block of the left third MPJ and after explaining risk I went ahead and I aspirated the joint getting out a small amount of clear fluid and injected quarter cc deck Smith some Kenalog into the joint and applied padding to reduce pressure and advised on stiff bottom shoes. Reappoint if symptoms were to persist or get worse  X-rays indicate that there is no indications of stress fracture or arthritis with good correction of structural bunion site and fixation in place

## 2017-11-25 ENCOUNTER — Ambulatory Visit
Admission: RE | Admit: 2017-11-25 | Discharge: 2017-11-25 | Disposition: A | Discharge status: Home / Self Care | Payer: Medicaid Other | Source: Ambulatory Visit | Attending: Family Medicine | Admitting: Family Medicine

## 2017-11-25 ENCOUNTER — Encounter: Payer: Self-pay | Admitting: Family Medicine

## 2017-11-25 ENCOUNTER — Ambulatory Visit: Payer: Medicaid Other | Admitting: Family Medicine

## 2017-11-25 VITALS — BP 132/67 | HR 90 | Temp 98.0°F | Resp 16 | Ht 66.0 in | Wt 132.0 lb

## 2017-11-25 DIAGNOSIS — M25512 Pain in left shoulder: Secondary | ICD-10-CM | POA: Diagnosis not present

## 2017-11-25 DIAGNOSIS — M25511 Pain in right shoulder: Secondary | ICD-10-CM

## 2017-11-25 DIAGNOSIS — W19XXXA Unspecified fall, initial encounter: Secondary | ICD-10-CM

## 2017-11-25 MED ORDER — NAPROXEN 500 MG PO TABS: 500.0000 mg | ORAL_TABLET | Freq: Two times a day (BID) | ORAL | 1 refills | Status: DC

## 2017-11-25 MED ORDER — CYCLOBENZAPRINE HCL 10 MG PO TABS: 10.0000 mg | ORAL_TABLET | Freq: Three times a day (TID) | ORAL | 0 refills | Status: DC | PRN

## 2017-11-25 NOTE — Patient Instructions (Addendum)
Thank you for coming to the clinic today.  1. X-ray today to rule out any fracture will call within 24 hours  2.  Recommend trial of Anti-inflammatory with Naproxen (Naprosyn) 500mg  tabs - take one with food and plenty of water TWICE daily every day (breakfast and dinner), for next 2 to 4 weeks, then you may take only as needed - DO NOT TAKE any ibuprofen, aleve, motrin while you are taking this medicine - It is safe to take Tylenol Ext Str 500mg  tabs - take 1 to 2 (max dose 1000mg ) every 6 hours as needed for breakthrough pain, max 24 hour daily dose is 6 to 8 tablets or 4000mg   Start Cyclobenzapine (Flexeril) 10mg  tablets (muscle relaxant) - start with half (cut) to one whole pill at night for muscle relaxant - may make you sedated or sleepy (be careful driving or working on this) if tolerated you can take half to whole tab 2 to 3 times daily or every 8 hours as needed  3.  If not improving after 2-3 weeks on medicines, can notify office and we can do referral to Physical Therapy - can go to [facility] if interested.  Please schedule a Follow-up Appointment to: Return in about 4 weeks (around 12/23/2017), or if symptoms worsen or fail to improve, for left shoulder pain.  If you have any other questions or concerns, please feel free to call the clinic or send a message through [place]. You may also schedule an earlier appointment if necessary.  Additionally, you may be receiving a survey about your experience at our clinic within a few days to 1 week by e-mail or mail. We value your feedback.  [name], DO [facility], [facility]  Range of Motion Shoulder Exercises  [place] with your good arm against a counter or table for support [facility] forward with a wide stance (make sure your body is comfortable) - Your painful shoulder should hang down and feel "heavy" - Gently move your painful arm in small circles "clockwise" for several  turns - Switch to "counterclockwise" for several turns - Early on keep circles narrow and move slowly - Later in rehab, move in larger circles and faster movement   Wall Crawl - Stand close (about 1-2 ft away) to a wall, facing it directly - Reach out with your arm of painful shoulder and place fingers (not palm) on wall - You should make contact with wall at your waist level - Slowly walk your fingers up the wall. Stay in contact with wall entire time, do not remove fingers - Keep walking fingers up wall until you reach shoulder level - You may feel tightening or mild discomfort, once you reach a height that causes pain or if you are already above your shoulder height then stop. Repeat from starting position. - Early on stand closer to wall, move fingers slowly, and stay at or below shoulder level - Later in rehab, stand farther away from wall (fingertips), move fingers quicker, go above shoulder level

## 2017-11-25 NOTE — Progress Notes (Addendum)
Subjective:    Patient ID: Caroline Conley, female    DOB: 10-31-1966, 50 y.o.   MRN: [id number]  Caroline Conley is a 51 y.o. female presenting on 11/25/2017 for Shoulder Pain (as per patient fell on ice and shoulder pain R side )  Patient presents for a same day appointment.  HPI   RIGHT Shoulder Pain, Acute - s/p Fall on Ice / History of Chronic Shoulder Pain Reports new complaint of R shoulder pain after fall 2 days ago, she slipped on ice and the R shoulder impacted directly after accidental fall, shoulder hit on the ice, did not fall on out stretched hand, she had pain immediately after and protected shoulder, and took it easy, she tried ice / heat, and ibuprofen 800mg  TID with limited, pain medicine tried Percocet 10/325mg  - Known history of prior shoulder bursitis and chronic shoulder pain in past, had problem with R shoulder >2 years ago, had seen by Dr Darral Dash, had frozen shoulder - Denies any redness, bruising, swelling, numbness, tingling, weakness  Depression screen Caroline Conley 2/9 08/06/2017 06/07/2016  Decreased Interest 0 0  Down, Depressed, Hopeless 0 0  PHQ - 2 Score 0 0    Social History   Tobacco Use  . Smoking status: Current Every Day Smoker    Packs/day: 0.25    Years: 30.00    Pack years: 7.50    Types: Cigarettes    Last attempt to quit: 08/04/2017    Years since quitting: 0.3  . Smokeless tobacco: Former Systems developer  . Tobacco comment: 0.5 to 1 ppd >30 years  Substance Use Topics  . Alcohol use: No  . Drug use: No    Review of Systems Per HPI unless specifically indicated above     Objective:    BP 132/67   Pulse 90   Temp 98 F (36.7 C) (Oral)   Resp 16   Ht 5\' 6"  (1.676 m)   Wt 132 lb (59.9 kg)   BMI 21.31 kg/m   Wt Readings from Last 3 Encounters:  11/25/17 132 lb (59.9 kg)  10/28/17 130 lb (59 kg)  10/20/17 132 lb (59.9 kg)    Physical Exam  Constitutional: She is oriented to person, place, and time. She appears well-developed and well-nourished.  No distress.  Well-appearing, mildly uncomfortable with R shoulder pain she is protecting it by keeping it at her side most of time but able to move, see MSK, cooperative  HENT:  Head: Normocephalic and atraumatic.  Mouth/Throat: Oropharynx is clear and moist.  Eyes: Conjunctivae are normal. Right eye exhibits no discharge. Left eye exhibits no discharge.  Neck: Normal range of motion. Neck supple.  Cardiovascular: Normal rate and intact distal pulses.  Pulmonary/Chest: Effort normal.  Musculoskeletal: She exhibits no edema.  RIGHT Shoulder Inspection: Normal appearance bilateral symmetrical Palpation: Mild tender to palpation over anterior and posterior shoulder  ROM: Notable limited active ROM - forward flexion above shoulder level due to pain, passive ROM intact but painful, abduction again limited to shoulder level, internal rotation behind back moderately limited - Asymmetrical compared to L shoulder Special Testing: Rotator cuff testing negative for weakness with supraspinatus full can and empty can test, Hawkin's AC impingement positive for pain Strength: Normal strength 5/5 flex/ext, ext rot / int rot, grip, rotator cuff str testing. Neurovascular: Distally intact pulses, sensation to light touch  Lymphadenopathy:    She has no cervical adenopathy.  Neurological: She is alert and oriented to person, place, and time.  Skin: Skin is warm and dry. No rash noted. She is not diaphoretic. No erythema.  Psychiatric: Her behavior is normal.  Well groomed, good eye contact, normal speech and thoughts  Nursing note and vitals reviewed.   I have personally reviewed the radiology report from 11/25/17 Left Shoulder X-ray.  CLINICAL DATA:  Left shoulder pain following fall several days ago, initial encounter  EXAM: LEFT SHOULDER - 2+ VIEW  COMPARISON:  None.  FINDINGS: There is no evidence of fracture or dislocation. There is no evidence of arthropathy or other focal bone  abnormality. Soft tissues are unremarkable.  IMPRESSION: No acute abnormality noted.   Electronically Signed   By: Inez Catalina M.D.   On: 11/25/2017 16:42    Assessment & Plan:   Problem List Items Addressed This Visit    None    Visit Diagnoses    Acute pain of right shoulder    -  Primary   Relevant Medications   cyclobenzaprine (FLEXERIL) 10 MG tablet   naproxen (NAPROSYN) 500 MG tablet   Other Relevant Orders   DG Shoulder Left (Completed)   Fall, initial encounter          Consistent with acute on chronic R shoulder pain secondary to slip fall on ice 2 days ago, concern for acute injury vs aggravation of old chronic shoulder pain problem with possible bursitis vs arthritis underlying, known history of ortho eval for frozen shoulder in past - Concern reduced active ROM but without significant evidence of muscle tear (no weakness).  - No recent imaging on chart  Plan: 1. Check R shoulder X-ray today to rule out fracture - reviewed results, called patient after, no fracture, no evidence of DJD either - DC home Ibuprofen PRN and start rx Naproxen 500mg  twice daily (with food) for 2 weeks, then as needed - Rx Cyclobenazaprine 5-10mg  TID vs QHS PRN 2. May take Tylenol Ex Str 1-2 q 6 hr PRN 3. Relative rest but keep shoulder mobile, demonstrated ROM exercises, avoid heavy lifting 4. May try heating pad PRN 5. Follow-up 4-6 weeks if not improved - consider referral to [facility] vs subacromial steroid inj vs referral if need   (this note was updated on 01/01/18, it turns out that she had X-ray on Left shoulder, due to incorrect order, however she had pain in BOTH shoulders at time of fall injury)  Meds ordered this encounter  Medications  . cyclobenzaprine (FLEXERIL) 10 MG tablet    Sig: Take 1 tablet (10 mg total) by mouth 3 (three) times daily as needed for muscle spasms. Or bedtime only as needed    Dispense:  30 tablet    Refill:  0  . naproxen (NAPROSYN) 500 MG  tablet    Sig: Take 1 tablet (500 mg total) by mouth 2 (two) times daily with a meal. For 2-4 weeks then as needed    Dispense:  60 tablet    Refill:  1    Follow up plan: Return in about 4 weeks (around 12/23/2017), or if symptoms worsen or fail to improve, for left shoulder pain.  [name], DO [facility] 11/25/2017, 6:08 PM

## 2017-12-26 ENCOUNTER — Encounter: Payer: Self-pay | Admitting: Podiatry

## 2017-12-26 ENCOUNTER — Ambulatory Visit (INDEPENDENT_AMBULATORY_CARE_PROVIDER_SITE_OTHER): Payer: Medicaid Other | Admitting: Podiatry

## 2017-12-26 DIAGNOSIS — M216X9 Other acquired deformities of unspecified foot: Secondary | ICD-10-CM | POA: Diagnosis not present

## 2017-12-26 DIAGNOSIS — M7752 Other enthesopathy of left foot: Secondary | ICD-10-CM | POA: Diagnosis not present

## 2017-12-26 DIAGNOSIS — M779 Enthesopathy, unspecified: Principal | ICD-10-CM

## 2017-12-26 DIAGNOSIS — M778 Other enthesopathies, not elsewhere classified: Secondary | ICD-10-CM

## 2017-12-26 NOTE — Patient Instructions (Signed)
Pre-Operative Instructions  Congratulations, you have decided to take an important step towards improving your quality of life.  You can be assured that the doctors and staff at [facility] will be with you every step of the way.  Here are some important things you should know:  1. Plan to be at the surgery center/hospital at least 1 (one) hour prior to your scheduled time, unless otherwise directed by the surgical center/hospital staff.  You must have a responsible adult accompany you, remain during the surgery and drive you home.  Make sure you have directions to the surgical center/hospital to ensure you arrive on time. 2. If you are having surgery at [facility] or [facility], you will need a copy of your medical history and physical form from your family physician within one month prior to the date of surgery. We will give you a form for your primary physician to complete.  3. We make every effort to accommodate the date you request for surgery.  However, there are times where surgery dates or times have to be moved.  We will contact you as soon as possible if a change in schedule is required.   4. No aspirin/ibuprofen for one week before surgery.  If you are on aspirin, any non-steroidal anti-inflammatory medications (Mobic, Aleve, Ibuprofen) should not be taken seven (7) days prior to your surgery.  You make take Tylenol for pain prior to surgery.  5. Medications - If you are taking daily heart and blood pressure medications, seizure, reflux, allergy, asthma, anxiety, pain or diabetes medications, make sure you notify the surgery center/hospital before the day of surgery so they can tell you which medications you should take or avoid the day of surgery. 6. No food or drink after midnight the night before surgery unless directed otherwise by surgical center/hospital staff. 7. No alcoholic beverages 24-hours prior to surgery.  No smoking 24-hours prior or 24-hours after  surgery. 8. Wear loose pants or shorts. They should be loose enough to fit over bandages, boots, and casts. 9. Don't wear slip-on shoes. Sneakers are preferred. 10. Bring your boot with you to the surgery center/hospital.  Also bring crutches or a walker if your physician has prescribed it for you.  If you do not have this equipment, it will be provided for you after surgery. 11. If you have not been contacted by the surgery center/hospital by the day before your surgery, call to confirm the date and time of your surgery. 12. Leave-time from work may vary depending on the type of surgery you have.  Appropriate arrangements should be made prior to surgery with your employer. 13. Prescriptions will be provided immediately following surgery by your doctor.  Fill these as soon as possible after surgery and take the medication as directed. Pain medications will not be refilled on weekends and must be approved by the doctor. 14. Remove nail polish on the operative foot and avoid getting pedicures prior to surgery. 15. Wash the night before surgery.  The night before surgery wash the foot and leg well with water and the antibacterial soap provided. Be sure to pay special attention to beneath the toenails and in between the toes.  Wash for at least three (3) minutes. Rinse thoroughly with water and dry well with a towel.  Perform this wash unless told not to do so by your physician.  Enclosed: 1 Ice pack (please put in freezer the night before surgery)   1 Hibiclens skin cleaner     Pre-op instructions  If you have any questions regarding the instructions, please do not hesitate to call our office.  [place]: [address], [place], [place] [address] -- [phone]  [place]: [address] [place], [place] [address] -- [phone]  [place]: [address]  [place], [place] [address] -- [phone]  [place]: [address], [address], [place],  [address] -- [phone]  Website:  [email or web address] 

## 2017-12-26 NOTE — Progress Notes (Signed)
Subjective:   Patient ID: Caroline Conley, female   DOB: 52 y.o.   MRN: [id number]   HPI Patient states the bone is still really bothering her on the left foot and she wants to have it fixed   ROS      Objective:  Physical Exam  Neurovascular status intact with inflammation and pain third metatarsophalangeal joint that is not responded to conservative treatment     Assessment:  Chronic inflammatory capsulitis the third MPJ left that's remained tender despite injections and padding and shoe gear changes     Plan:  Reviewed condition and recommended shortening osteotomy. I allowed patient to read consent form going over alternative treatments and complications and the fact there is no guarantee this was all the problem and it could create other problems as outlined in the consent form. Patient is willing to accept risk wants surgery and after extensive review signs consent form is given all preoperative instructions for procedure and is scheduled for outpatient surgery with the understanding total recovery can take 6 months to one year. Encouraged to call with any questions

## 2018-01-01 ENCOUNTER — Ambulatory Visit (INDEPENDENT_AMBULATORY_CARE_PROVIDER_SITE_OTHER): Payer: Medicaid Other | Admitting: Family Medicine

## 2018-01-01 ENCOUNTER — Telehealth: Payer: Self-pay | Admitting: *Deleted

## 2018-01-01 ENCOUNTER — Encounter: Payer: Self-pay | Admitting: Family Medicine

## 2018-01-01 VITALS — BP 115/74 | HR 104 | Temp 98.5°F | Resp 16 | Ht 66.0 in | Wt 133.0 lb

## 2018-01-01 DIAGNOSIS — S61210A Laceration without foreign body of right index finger without damage to nail, initial encounter: Secondary | ICD-10-CM | POA: Diagnosis not present

## 2018-01-01 DIAGNOSIS — G8929 Other chronic pain: Secondary | ICD-10-CM | POA: Insufficient documentation

## 2018-01-01 DIAGNOSIS — M7500 Adhesive capsulitis of unspecified shoulder: Secondary | ICD-10-CM | POA: Insufficient documentation

## 2018-01-01 DIAGNOSIS — M25512 Pain in left shoulder: Secondary | ICD-10-CM | POA: Diagnosis not present

## 2018-01-01 DIAGNOSIS — M7501 Adhesive capsulitis of right shoulder: Secondary | ICD-10-CM

## 2018-01-01 DIAGNOSIS — M25511 Pain in right shoulder: Secondary | ICD-10-CM | POA: Diagnosis not present

## 2018-01-01 DIAGNOSIS — M7551 Bursitis of right shoulder: Secondary | ICD-10-CM | POA: Diagnosis not present

## 2018-01-01 MED ORDER — LIDOCAINE HCL (PF) 1 % IJ SOLN
4.0000 mL | Freq: Once | INTRAMUSCULAR | Status: AC
  Administered 2018-01-01: 4 mL

## 2018-01-01 MED ORDER — SILVER SULFADIAZINE 1 % EX CREA: 1.0000 "application " | TOPICAL_CREAM | Freq: Every day | CUTANEOUS | 0 refills | Status: DC

## 2018-01-01 MED ORDER — METHYLPREDNISOLONE ACETATE 40 MG/ML IJ SUSP
40.0000 mg | Freq: Once | INTRAMUSCULAR | Status: AC
  Administered 2018-01-01: 40 mg via INTRA_ARTICULAR

## 2018-01-01 NOTE — Progress Notes (Signed)
Subjective:    Patient ID: Caroline Conley, female    DOB: 12/12/1966, 52 y.o.   MRN: [id number]  Caroline Conley is a 52 y.o. female presenting on 01/01/2018 for Shoulder Pain (right side )   HPI   FOLLOW-UP SHOULDER PAIN / history of chronic R frozen shoulder and prior rotator cuff surgery - Last visit with me 11/25/17, for initial visit for similar problem with bilateral shoulder pain, had fall initially thought to have been on left side and actually describes pain worsening in RIGHT shoulder after injury, reviewed chart she was treated with flexeril and naproxen, however X-ray was of LEFT shoulder but she did not notify us that this was not ordered properly. see prior notes for background information. - Interval update with some improvement on the medicines Naproxen and Flexeril. But still pain. - Today patient reports chronic problem for few years after her R shoulder surgery, seems to hurt worse post-op than before, has had frozen shoulder, in past received steroid subacromial injections into bursa with good results, last one was good up to 8 months - Requesting new injection today, she was followed by [facility] in [place], but did not want to go back at this time, would like injection today then if needed she could schedule with them, she will request records - Taking NSAID and chronic pain meds percocet with some relief - Her range of motion has never improved post op due to frozen shoulder - No new fall or injury - Her left shoulder is much improved on medicine since last visit, still has flare ups bursitis occasionally - Denies any redness, bruising, swelling, numbness, tingling, weakness  Updates - She is scheduled for foot surgery next month at [facility]  Depression screen [facility] 2/9 08/06/2017 06/07/2016  Decreased Interest 0 0  Down, Depressed, Hopeless 0 0  PHQ - 2 Score 0 0    Social History   Tobacco Use  . Smoking status: Current Every Day Smoker    Packs/day: 0.25    Years:  30.00    Pack years: 7.50    Types: Cigarettes    Last attempt to quit: 08/04/2017    Years since quitting: 0.4  . Smokeless tobacco: Former Systems developer  . Tobacco comment: 0.5 to 1 ppd >30 years  Substance Use Topics  . Alcohol use: No  . Drug use: No    Review of Systems Per HPI unless specifically indicated above     Objective:    BP 115/74   Pulse (!) 104   Temp 98.5 F (36.9 C) (Oral)   Resp 16   Ht 5\' 6"  (1.676 m)   Wt 133 lb (60.3 kg)   BMI 21.47 kg/m   Wt Readings from Last 3 Encounters:  01/01/18 133 lb (60.3 kg)  11/25/17 132 lb (59.9 kg)  10/28/17 130 lb (59 kg)    Physical Exam  Constitutional: She is oriented to person, place, and time. She appears well-developed and well-nourished. No distress.  Well-appearing, mostly comfortable, limited range of R shoulder, see MSK, cooperative  HENT:  Head: Normocephalic and atraumatic.  Mouth/Throat: Oropharynx is clear and moist.  Eyes: Conjunctivae are normal. Right eye exhibits no discharge. Left eye exhibits no discharge.  Neck: Normal range of motion. Neck supple.  Cardiovascular: Normal rate and intact distal pulses.  Pulmonary/Chest: Effort normal.  Musculoskeletal: She exhibits no edema.  Bilateral Shoulders Inspection: R shoulder has surgical scar from prior surgery, slight bony deformity at [facility]. Compared to L  shoulder Palpation: Mild tender to palpation over anterior and posterior shoulder (RIGHT only) ROM: Stable chronic limited active ROM R shoulder due to chronic frozen shoulder post surgical R shoulder limited flexion and abduction < shoulder level and limited internal rotation, LEFT shoulder is full active ROM - Asymmetrical compared to L shoulder Special Testing: Rotator cuff testing negative for weakness but with pain, R shoulder Hawkin's AC impingement positive for pain Strength: Normal strength 5/5 flex/ext, ext rot / int rot, grip, rotator cuff str testing. Neurovascular: Distally intact pulses, sensation  to light touch  Lymphadenopathy:    She has no cervical adenopathy.  Neurological: She is alert and oriented to person, place, and time.  Skin: Skin is warm and dry. No rash noted. She is not diaphoretic. No erythema.  Small 1 cm barely open laceration with break in skin on tip of R index finger, notable dry skin of fingers, non tender, no erythema, no drainage  Psychiatric: Her behavior is normal.  Well groomed, good eye contact, normal speech and thoughts  Nursing note and vitals reviewed. ________________________________________________________ PROCEDURE NOTE Date: 01/01/18 Right Shoulder subacromial injection Discussed benefits and risks (including pain, bleeding, infection, steroid flare). Verbal consent given by patient. Medication:  1 cc Depo-medrol 40mg  and 4 cc Lidocaine 1% without epi Time Out taken  Landmarks identified. Area cleansed with alcohol wipes.Using 21 gauge and 1, 1/2 inch needle, Right subacromial bursa space was injected (with above listed medication) via posterior approach. Cold spray used for superficial anesthetic.Sterile bandage placed.Patient tolerated procedure well without bleeding or paresthesias.No complications.     Assessment & Plan:   Problem List Items Addressed This Visit    Chronic bursitis of right shoulder - Primary    Consistent with subacute on chronic R-shoulder bursitis with known rotator cuff tendinopathy, s/p repair and frozen shoulder complication - worse with repetitive activities, triggers bursitis - limited ROM severely - recent fall 1 month ago, flared up, not improved as much on meds - Last imaging L shoulder x-ray she has had prior R shoulder imaging and last injection 8 months ago at [facility]  Plan: Right shoulder subacromial steroid injection performed today, see procedure note for details. Continue current meds for pain management no med changes  Relative rest but keep shoulder mobile, demonstrated ROM exercises,  avoid heavy lifting  Follow-up 4-6 weeks if not improved for re-evaluation, consider referral to Physical Therapy, X-rays and likely return back to [facility], may need glenohumeral injection via US guidance      Relevant Medications   methylPREDNISolone acetate (DEPO-MEDROL) injection 40 mg (Completed)   lidocaine (PF) (XYLOCAINE) 1 % injection 4 mL (Completed)   Chronic pain of both shoulders   Relevant Medications   methylPREDNISolone acetate (DEPO-MEDROL) injection 40 mg (Completed)   lidocaine (PF) (XYLOCAINE) 1 % injection 4 mL (Completed)   Frozen shoulder    Chronic problem sequela after R rotator cuff surgery >2 yr ago Complicated with limited ROM and pain       Other Visit Diagnoses    Laceration of right index finger without foreign body without damage to nail, initial encounter       No injury, but open wound due to dry skin dermatitis skin crack, tried topical moisture and antibacterial. Trial silvadene to heal. no infection today  Recommend moisturizers applied at night in cloth gloves overnight    Relevant Medications   silver sulfADIAZINE (SILVADENE) 1 % cream      Meds ordered this encounter  Medications  .  methylPREDNISolone acetate (DEPO-MEDROL) injection 40 mg  . lidocaine (PF) (XYLOCAINE) 1 % injection 4 mL  . silver sulfADIAZINE (SILVADENE) 1 % cream    Sig: Apply 1 application topically daily.    Dispense:  50 g    Refill:  0    Follow up plan: Return in about 6 weeks (around 02/12/2018), or if symptoms worsen or fail to improve, for RIght shoulder pain.  [name], [facility] 01/01/2018, 1:41 PM

## 2018-01-01 NOTE — Telephone Encounter (Signed)
I called the patient to verify her surgery date.  She said she's supposed to be scheduled for the same date as her daughter, Anderson Malta, which is February 19.

## 2018-01-01 NOTE — Assessment & Plan Note (Signed)
Chronic problem sequela after R rotator cuff surgery >2 yr ago Complicated with limited ROM and pain

## 2018-01-01 NOTE — Patient Instructions (Addendum)
Thank you for coming to the office today.   1.  You received a Right Shoulder Joint steroid injection today. - Lidocaine numbing medicine may ease the pain initially for a few hours until it wears off - As discussed, you may experience a "steroid flare" this evening or within 24-48 hours, anytime medicine is injected into an inflamed joint it can cause the pain to get worse temporarily - Everyone responds differently to these injections, it depends on the patient and the severity of the joint problem, it may provide anywhere from days to weeks, to months of relief. Ideal response is >6 months relief - Try to take it easy for next 1-2 days, avoid over activity and strain on joint (limit lifting for shoulder) - Recommend the following:   - For swelling - rest, compression sleeve / ACE wrap, elevation, and ice packs as needed for first few days   - For pain in future may use heating pad or moist heat as needed  If need can return to [facility] if this isnt as effective  Have them send me your last record  For finger - try silvadene cream, may consider topical steroid as well in future if dry skin in winter  May try vaseline and cloth gloves  Please schedule a Follow-up Appointment to: Return in about 6 weeks (around 02/12/2018), or if symptoms worsen or fail to improve, for RIght shoulder pain.    If you have any other questions or concerns, please feel free to call the office or send a message through [place]. You may also schedule an earlier appointment if necessary.  Additionally, you may be receiving a survey about your experience at our office within a few days to 1 week by e-mail or mail. We value your feedback.  [name], DO [place]

## 2018-01-01 NOTE — Assessment & Plan Note (Signed)
Consistent with subacute on chronic R-shoulder bursitis with known rotator cuff tendinopathy, s/p repair and frozen shoulder complication - worse with repetitive activities, triggers bursitis - limited ROM severely - recent fall 1 month ago, flared up, not improved as much on meds - Last imaging L shoulder x-ray she has had prior R shoulder imaging and last injection 8 months ago at [facility]  Plan: Right shoulder subacromial steroid injection performed today, see procedure note for details. Continue current meds for pain management no med changes  Relative rest but keep shoulder mobile, demonstrated ROM exercises, avoid heavy lifting  Follow-up 4-6 weeks if not improved for re-evaluation, consider referral to Physical Therapy, X-rays and likely return back to [facility], may need glenohumeral injection via US guidance

## 2018-01-10 ENCOUNTER — Other Ambulatory Visit: Payer: Self-pay | Admitting: Family Medicine

## 2018-01-10 DIAGNOSIS — M25511 Pain in right shoulder: Secondary | ICD-10-CM

## 2018-01-14 ENCOUNTER — Telehealth: Payer: Self-pay | Admitting: *Deleted

## 2018-01-14 MED ORDER — TIOTROPIUM BROMIDE MONOHYDRATE 2.5 MCG/ACT IN AERS: 2.0000 | INHALATION_SPRAY | Freq: Every day | RESPIRATORY_TRACT | 11 refills | Status: DC

## 2018-01-14 NOTE — Telephone Encounter (Signed)
Patient ins denied Spiriva 18 mcg Attempted PA and alternative is now Spiriva Resp which is what was originally prescribed. Submitted PA switching back to Respimat 2.5 mg. Pending decision.

## 2018-01-14 NOTE — Telephone Encounter (Signed)
Called [facility] to confirm receipt of Spirva Resp. RX received and it has been approved $4. Patient is aware of inhaler change. Nothing further needed.

## 2018-01-26 ENCOUNTER — Telehealth: Payer: Self-pay | Admitting: Family Medicine

## 2018-01-26 NOTE — Telephone Encounter (Signed)
Left detail message for [facility] [phone].

## 2018-01-26 NOTE — Telephone Encounter (Signed)
Approval given for 6 visit.

## 2018-01-26 NOTE — Telephone Encounter (Signed)
Pt needs a referral to see Dr. Evorn Gong at [facility].  She has appt scheduled tomorrow at 12:45.  Her call back number is [phone]

## 2018-02-03 ENCOUNTER — Encounter: Payer: Self-pay | Admitting: Podiatry

## 2018-02-03 DIAGNOSIS — K219 Gastro-esophageal reflux disease without esophagitis: Secondary | ICD-10-CM | POA: Diagnosis not present

## 2018-02-03 DIAGNOSIS — M21542 Acquired clubfoot, left foot: Secondary | ICD-10-CM | POA: Diagnosis not present

## 2018-02-04 ENCOUNTER — Telehealth: Payer: Self-pay | Admitting: *Deleted

## 2018-02-04 NOTE — Telephone Encounter (Signed)
Called and tried to leave a message and the voice mail has not been set up. Lattie Haw

## 2018-02-04 NOTE — Telephone Encounter (Signed)
Caroline Conley - [facility] states pt has not got a profile with them and has not gotten rx called in.

## 2018-02-05 ENCOUNTER — Encounter: Payer: Self-pay | Admitting: Internal Medicine

## 2018-02-05 ENCOUNTER — Ambulatory Visit: Payer: Medicaid Other | Admitting: Internal Medicine

## 2018-02-05 DIAGNOSIS — M25519 Pain in unspecified shoulder: Secondary | ICD-10-CM | POA: Insufficient documentation

## 2018-02-05 DIAGNOSIS — M75120 Complete rotator cuff tear or rupture of unspecified shoulder, not specified as traumatic: Secondary | ICD-10-CM | POA: Insufficient documentation

## 2018-02-05 DIAGNOSIS — J410 Simple chronic bronchitis: Secondary | ICD-10-CM

## 2018-02-05 MED ORDER — FLUTICASONE-SALMETEROL 250-50 MCG/DOSE IN AEPB: 1.0000 | INHALATION_SPRAY | Freq: Two times a day (BID) | RESPIRATORY_TRACT | 3 refills | Status: DC

## 2018-02-05 MED ORDER — PREDNISONE 20 MG PO TABS: 40.0000 mg | ORAL_TABLET | Freq: Every day | ORAL | 0 refills | Status: DC

## 2018-02-05 MED ORDER — HYDROCOD POLST-CPM POLST ER 10-8 MG/5ML PO SUER: 5.0000 mL | Freq: Two times a day (BID) | ORAL | 0 refills | Status: DC | PRN

## 2018-02-05 MED ORDER — HYDROCOD POLST-CPM POLST ER 10-8 MG/5ML PO SUER: 5.0000 mL | Freq: Two times a day (BID) | ORAL | Status: DC | PRN

## 2018-02-05 MED ORDER — TIOTROPIUM BROMIDE MONOHYDRATE 2.5 MCG/ACT IN AERS: 2.0000 | INHALATION_SPRAY | Freq: Every day | RESPIRATORY_TRACT | 11 refills | Status: DC

## 2018-02-05 MED ORDER — LEVOFLOXACIN 500 MG PO TABS: 500.0000 mg | ORAL_TABLET | Freq: Every day | ORAL | 0 refills | Status: AC

## 2018-02-05 NOTE — Progress Notes (Signed)
   Name: Caroline Conley MRN: [id number] DOB: Jul 15, 1966    CONSULTATION DATE:  02/05/2018   REFERRING MD : [name]  CC: COPD evaluation  Previous PFT 2017 ratio 18%, Fev1 2.2L 74% fef25/75 0.78L 22%  HISTORY OF PRESENT ILLNESS: 52 yo WF with dx fo COPD Chronic SOB and DOE  CT of the chest reviewed with patient and family there is no lung masses noted however there is areas of emphysema in the upper lobe region bilaterally  Still smokes, 2 ppd for 40 years Uses advair, spiriva Uses albuteol nebs and inhaler as needed +sick contact +productive cough +wheezing   has COPD exacerbation now   Allergies  Allergen Reactions  . 2,4-D Dimethylamine (Amisol) Itching    (Pt unsure of this allergy)  . Morphine And Related Itching  . Vicodin [Hydrocodone-Acetaminophen] Nausea And Vomiting    GI distress    REVIEW OF SYSTEMS:   Constitutional: Negative for fever, chills, weight loss, +malaise/fatigue  HENT: Negative for hearing loss, ear pain, nosebleeds, congestion, sore throat, neck pain, tinnitus and ear discharge.   Eyes: Negative for blurred vision, double vision, photophobia, pain, discharge and redness.  Respiratory:+cough, -hemoptysis, +sputum production, +shortness of breath, +wheezing and -stridor.   Cardiovascular: Negative for chest pain, palpitations, orthopnea, claudication, leg swelling and PND.  Gastrointestinal: Negative for heartburn, nausea, vomiting, abdominal pain, diarrhea, constipation, blood in stool and melena.  Endo/Heme/Allergies: Negative for environmental allergies and polydipsia. Does not bruise/bleed easily. See HPI    VITAL SIGNS: BP 136/86 (BP Location: Left Arm, Cuff Size: Normal)   Pulse 99   Resp 16   Ht 5\' 6"  (1.676 m)   Wt 134 lb (60.8 kg)   SpO2 93%   BMI 21.63 kg/m   Physical Examination:  GENERAL:NAD, no fevers, chills, no weakness no fatigue HEAD: Normocephalic, atraumatic.  EYES: Pupils equal, round, reactive to light.  Extraocular muscles intact. No scleral icterus.  MOUTH: Moist mucosal membrane. Dentition intact. No abscess noted.  EAR, NOSE, THROAT: Clear without exudates. No external lesions.  NECK: Supple. No thyromegaly. No nodules. No JVD.  PULMONARY:  +wheezes CARDIOVASCULAR: S1 and S2. Regular rate and rhythm. No murmurs, rubs, or gallops. No edema. Pedal pulses 2+ bilaterally.  ALL OTHER ROS ARE NEGATIVE  ASSESSMENT / PLAN: 52 yo white female with mild/moderate COPD stage D with COPD-seems to be controlled at this time +COPD exacerbation at this time   Continue Spiriva as prescribed Change advair to Symbicort due to hoarse voice CT chest +for emphysema Smoking cessation strongly advised  COPD exacerbation -prednisone and abx therapy    All questions answered Follow-up in 3 months   Anthonia Monger [name], M.D.  [name]  Medical Director [place] Director [facility] Cardio-Pulmonary Department

## 2018-02-05 NOTE — Patient Instructions (Signed)
Prednisone 40 mg daily for 10 days Levaquin for 10 days Tussionex as needed Stop SYMBICORT START ADVAIR  Continue all other inhalers as prescribed  STOP SMOKING!!!!

## 2018-02-05 NOTE — Progress Notes (Signed)
   Name: Caroline Conley MRN: [id number] DOB: [birth year]    CONSULTATION DATE:  02/05/2018   REFERRING MD : [name]  CC: COPD evaluation  Previous PFT 2017 ratio 18%, Fev1 2.2L 74% fef25/75 0.78L 22%  HISTORY OF PRESENT ILLNESS: 52 yo WF with dx fo COPD Chronic SOB and DOE Still wheezing CT of the chest reviewed with patient and family at last OV  there is no lung masses noted however there is areas of emphysema in the upper lobe region bilaterally  Still smokes, 2 ppd for 40 years Uses symbicort but does not help as much as advair did On  spiriva Uses albuteol nebs and inhaler as needed +sick contact +productive cough +wheezing   has COPD exacerbation now    Allergies  Allergen Reactions  . 2,4-D Dimethylamine (Amisol) Itching    (Pt unsure of this allergy)  . Morphine And Related Itching  . Vicodin [Hydrocodone-Acetaminophen] Nausea And Vomiting    GI distress   REVIEW OF SYSTEMS:   Constitutional: Negative for fever, chills, weight loss, +malaise/fatigue  HENT: Negative for hearing loss, ear pain, nosebleeds, congestion, sore throat, neck pain, tinnitus and ear discharge.   Eyes: Negative for blurred vision, double vision, photophobia, pain, discharge and redness.  Respiratory:+cough, -hemoptysis, +sputum production, +shortness of breath, +wheezing and -stridor.   Cardiovascular: Negative for chest pain, palpitations, orthopnea, claudication, leg swelling and PND.  Gastrointestinal: Negative for heartburn, nausea, vomiting, abdominal pain, diarrhea, constipation, blood in stool and melena.  Endo/Heme/Allergies: Negative for environmental allergies and polydipsia. Does not bruise/bleed easily. See HPI     VITAL SIGNS: BP 136/86 (BP Location: Left Arm, Cuff Size: Normal)   Pulse 99   Resp 16   Ht 5\' 6"  (1.676 m)   Wt 134 lb (60.8 kg)   SpO2 93%   BMI 21.63 kg/m   Physical Examination:  GENERAL:NAD, no fevers, chills, no weakness no fatigue HEAD:  Normocephalic, atraumatic.  EYES: Pupils equal, round, reactive to light. Extraocular muscles intact. No scleral icterus.  MOUTH: Moist mucosal membrane. Dentition intact. No abscess noted.  EAR, NOSE, THROAT: Clear without exudates. No external lesions.  NECK: Supple. No thyromegaly. No nodules. No JVD.  PULMONARY:  +wheezes CARDIOVASCULAR: S1 and S2. Regular rate and rhythm. No murmurs, rubs, or gallops. No edema. Pedal pulses 2+ bilaterally.  ALL OTHER ROS ARE NEGATIVE  ASSESSMENT / PLAN: 52 yo white female with mild/moderate COPD stage D with COPD- +COPD exacerbation at this time-no signs of acute resp distress at this time   Continue Spiriva as prescribed Change back to Advair CT chest +for emphysema  Smoking cessation strongly advised Chantix to be started in next 24 hrs  COPD exacerbation -prednisone 40 mg daily for 10 days and abx therapy with Levaquin for 10 days    All questions answered Follow-up in 3 months   Tsuruko Murtha [name], M.D.  [name]  Medical Director [place] Director [facility] Cardio-Pulmonary Department

## 2018-02-11 ENCOUNTER — Encounter: Payer: Self-pay | Admitting: Podiatry

## 2018-02-11 ENCOUNTER — Ambulatory Visit (INDEPENDENT_AMBULATORY_CARE_PROVIDER_SITE_OTHER): Payer: Medicaid Other

## 2018-02-11 ENCOUNTER — Ambulatory Visit (INDEPENDENT_AMBULATORY_CARE_PROVIDER_SITE_OTHER): Payer: Medicaid Other | Admitting: Podiatry

## 2018-02-11 VITALS — BP 121/71 | HR 74 | Temp 99.4°F

## 2018-02-11 DIAGNOSIS — M7752 Other enthesopathy of left foot: Secondary | ICD-10-CM

## 2018-02-11 DIAGNOSIS — M779 Enthesopathy, unspecified: Principal | ICD-10-CM

## 2018-02-11 DIAGNOSIS — M778 Other enthesopathies, not elsewhere classified: Secondary | ICD-10-CM

## 2018-02-11 DIAGNOSIS — M2012 Hallux valgus (acquired), left foot: Secondary | ICD-10-CM

## 2018-02-11 NOTE — Progress Notes (Signed)
Subjective:   Patient ID: Caroline Conley, female   DOB: 52 y.o.   MRN: [id number]   HPI Patient states she is doing really well with surgery with minimal discomfort and is wearing her boot   ROS      Objective:  Physical Exam  Neurovascular status intact negative Homans sign noted with patient's left foot healing well with wound edges well coapted and good alignment noted     Assessment:  Doing well post metatarsal osteotomy third left     Plan:  X-ray reviewed sterile dressing reapplied continue immobilization and reappoint 3 weeks or earlier if needed  X-rays indicate the osteotomy is healing well with fixation in place in good alignment

## 2018-03-05 ENCOUNTER — Other Ambulatory Visit: Payer: Medicaid Other | Admitting: Podiatry

## 2018-03-06 ENCOUNTER — Encounter: Payer: Self-pay | Admitting: Family Medicine

## 2018-03-06 ENCOUNTER — Ambulatory Visit (INDEPENDENT_AMBULATORY_CARE_PROVIDER_SITE_OTHER): Payer: Medicaid Other | Admitting: Family Medicine

## 2018-03-06 ENCOUNTER — Other Ambulatory Visit: Payer: Self-pay

## 2018-03-06 VITALS — BP 107/72 | HR 89 | Temp 97.6°F | Resp 18 | Ht 66.0 in | Wt 133.0 lb

## 2018-03-06 DIAGNOSIS — J449 Chronic obstructive pulmonary disease, unspecified: Secondary | ICD-10-CM

## 2018-03-06 DIAGNOSIS — R52 Pain, unspecified: Secondary | ICD-10-CM

## 2018-03-06 DIAGNOSIS — R509 Fever, unspecified: Secondary | ICD-10-CM

## 2018-03-06 LAB — POCT INFLUENZA A/B
INFLUENZA B, POC: NEGATIVE
Influenza A, POC: NEGATIVE

## 2018-03-06 MED ORDER — PREDNISONE 50 MG PO TABS: 50.0000 mg | ORAL_TABLET | Freq: Every day | ORAL | 0 refills | Status: DC

## 2018-03-06 MED ORDER — DOXYCYCLINE HYCLATE 100 MG PO TABS
100.0000 mg | ORAL_TABLET | Freq: Two times a day (BID) | ORAL | 0 refills | Status: DC
Start: 2018-03-06 — End: 2018-03-19

## 2018-03-06 NOTE — Progress Notes (Signed)
Subjective:    Patient ID: Caroline Conley, female    DOB: 11-Oct-1966, 52 y.o.   MRN: [id number]  Caroline Conley is a 52 y.o. female presenting on 03/06/2018 for Pleurisy (chest congestion, bodyaches, fatigue and malaise x 2 days. Pt feel like its related to her COPD. )  Patient presents for a same day appointment.  HPI   COPD EXACERBATION Known history of COPD and active smoker. She has had history of recurrent pulmonary infections with bronchitis vs COPD flares, last seen by Dr Mortimer Fries [facility]) 02/05/18, had prior Chest CT in past showed emphysema, reviewed in past, she had recent worsening flare at that time with productive cough and wheezing. She was treated for AECOPD with Levaquin 500mg  daily x 10 days, Prednisone 40mg  daily x 10, and switched Symbicort to Advair for maintenance due to previous failed Symbicort, did better on Advair. - Interval update, on that prior treatment she improved to only about 70% but never got back to 100%, now about 4 weeks later presents again - Today reports worsening over past 2 days, she had generalized symptoms with body aches and chest congestion very thick sputum and mucus, fatigued and more drained than usual, some dyspnea and wheezing - recent sick contacts and has young relative at day care who had been sick - no known contact with Flu. She did get Flu Shot 08/06/17 - Admits soreness in chest wall and ribs worse with coughing generalized - Denies chest pain or pressure, nausea vomiting, headache, sinus pain or congestion, dizziness or lightheadedness    Depression screen [facility] 2/9 08/06/2017 06/07/2016  Decreased Interest 0 0  Down, Depressed, Hopeless 0 0  PHQ - 2 Score 0 0    Social History   Tobacco Use  . Smoking status: Current Every Day Smoker    Packs/day: 0.25    Years: 30.00    Pack years: 7.50    Types: Cigarettes    Last attempt to quit: 08/04/2017    Years since quitting: 0.5  . Smokeless tobacco: Former Systems developer  . Tobacco  comment: 0.5 to 1 ppd >30 years  Substance Use Topics  . Alcohol use: No  . Drug use: No    Review of Systems Per HPI unless specifically indicated above     Objective:    BP 107/72 (BP Location: Left Arm, Patient Position: Sitting, Cuff Size: Normal)   Pulse 89   Temp 97.6 F (36.4 C) (Oral)   Resp 18   Ht 5\' 6"  (1.676 m)   Wt 133 lb (60.3 kg)   SpO2 97%   BMI 21.47 kg/m   Wt Readings from Last 3 Encounters:  03/06/18 133 lb (60.3 kg)  02/05/18 134 lb (60.8 kg)  01/01/18 133 lb (60.3 kg)    Physical Exam  Constitutional: She is oriented to person, place, and time. She appears well-developed and well-nourished. No distress.  Mildly tired appearing but mostly well, comfortable, cooperative  HENT:  Head: Normocephalic and atraumatic.  Mouth/Throat: Oropharynx is clear and moist.  Frontal / maxillary sinuses non-tender. Nares patent without purulence or edema. Bilateral TMs clear without erythema, effusion or bulging. Oropharynx clear without erythema, exudates, edema or asymmetry.  Eyes: Conjunctivae are normal. Right eye exhibits no discharge. Left eye exhibits no discharge.  Neck: Normal range of motion. Neck supple.  Cardiovascular: Normal rate, regular rhythm, normal heart sounds and intact distal pulses.  No murmur heard. Pulmonary/Chest: Effort normal. No respiratory distress. She has wheezes (Improved but persistent  diffuse end exp wheezing). She has no rales (Resolved coarse breath sounds).  Diffuse wheezing with end expiratory worse, some coarse breath sounds bilateral, occasional coughing. Reduced air movement.  Musculoskeletal: She exhibits no edema.  Lymphadenopathy:    She has no cervical adenopathy.  Neurological: She is alert and oriented to person, place, and time.  Skin: Skin is warm and dry. No rash noted. She is not diaphoretic. No erythema.  Psychiatric: Her behavior is normal.  Nursing note and vitals reviewed.    Results for orders placed or  performed in visit on 03/06/18  POCT Influenza A/B  Result Value Ref Range   Influenza A, POC Negative Negative   Influenza B, POC Negative Negative      Assessment & Plan:   Problem List Items Addressed This Visit    COPD (chronic obstructive pulmonary disease) ([place]) - Primary   Relevant Medications   doxycycline (VIBRA-TABS) 100 MG tablet   predniSONE (DELTASONE) 50 MG tablet    Other Visit Diagnoses    Generalized body aches       Relevant Orders   POCT Influenza A/B (Completed)   Fever and chills       Relevant Orders   POCT Influenza A/B (Completed)      Consistent with mild acute exacerbation of COPD with worsening productive cough. Similar to prior exacerbations, last 1 month ago, however did not fully recover on levaquin x 10 days and prednisone 40 x 10 day. - No hypoxia (97% on RA), afebrile, no recent hospitalization - Continues Albuterol, Advair  Plan: 1. Negative flu test - discussed possible viral etiology, considered empiric flu treatment but agreed to hold for now - Trial on different antibiotic - instead of repeated levaquin course - switch to Doxycycline 100mg  BID x 10 days, precautions given - Start Prednisone 50mg  x 5 day steroid burst 2. Use albuterol q 4 hr regularly x 2-3 days. Continue maintenance inhalers - Smoking cessation offered 3. RTC about 1 week if not improving, otherwise strict return criteria to go to ED  Should follow-up with [facility] in future if worsening, otherwise we can check Chest X-ray acutely next week if need and may change antibiotics if ineffective.   Meds ordered this encounter  Medications  . doxycycline (VIBRA-TABS) 100 MG tablet    Sig: Take 1 tablet (100 mg total) by mouth 2 (two) times daily. For 10 days. Take with full glass of water, stay upright 30 min after taking.    Dispense:  20 tablet    Refill:  0  . predniSONE (DELTASONE) 50 MG tablet    Sig: Take 1 tablet (50 mg total) by mouth daily with  breakfast.    Dispense:  5 tablet    Refill:  0    Follow up plan: Return in about 1 week (around 03/13/2018), or if symptoms worsen or fail to improve, for COPD.  [name], [facility] 03/06/2018, 5:31 PM

## 2018-03-06 NOTE — Patient Instructions (Addendum)
Thank you for coming to the office today.  It sounds like you had an Upper Respiratory Virus that has settled into a Bronchitis, lower respiratory tract infection. I don't have concerns for pneumonia today, and think that this should gradually improve. Once you are feeling better, the cough may take a few weeks to fully resolve. I do hear wheezing and coarse breath sounds, this may be due to the virus, also could be related to smoking.  Repeat round of prednisone and antibiotic today  Doxycycline 100mg  twice daily for 10 days - take with full glass water stay upright 30 min standing / seated  - Start Prednisone 50mg  daily for next 5 days - this will open up lungs allow you to breath better and treat that wheezing or bronchospasm - Use Albuterol inhaler 2 puffs every 4-6 hours around the clock for next 2-3 days, max up to 5 days then use as needed   Continue Advair  - Use nasal saline (Simply Saline or Ocean Spray) to flush nasal congestion multiple times a day, may help cough - Drink plenty of fluids to improve congestion  May need Chest X-ray early next week if not improving.  If your symptoms seem to worsen instead of improve over next several days, including significant fever / chills, worsening shortness of breath, worsening wheezing, or nausea / vomiting and can't take medicines - return sooner or go to hospital Emergency Department for more immediate treatment.   Please schedule a Follow-up Appointment to: Return in about 1 week (around 03/13/2018), or if symptoms worsen or fail to improve, for COPD.  If you have any other questions or concerns, please feel free to call the office or send a message through [place]. You may also schedule an earlier appointment if necessary.  Additionally, you may be receiving a survey about your experience at our office within a few days to 1 week by e-mail or mail. We value your feedback.  [name], DO [place]

## 2018-03-09 ENCOUNTER — Ambulatory Visit: Payer: Medicaid Other

## 2018-03-09 ENCOUNTER — Encounter: Payer: Medicaid Other | Admitting: Podiatry

## 2018-03-09 DIAGNOSIS — M216X2 Other acquired deformities of left foot: Secondary | ICD-10-CM

## 2018-03-18 ENCOUNTER — Ambulatory Visit: Payer: Medicaid Other | Admitting: Family Medicine

## 2018-03-19 ENCOUNTER — Encounter: Payer: Self-pay | Admitting: Internal Medicine

## 2018-03-19 ENCOUNTER — Ambulatory Visit (INDEPENDENT_AMBULATORY_CARE_PROVIDER_SITE_OTHER): Payer: Medicaid Other | Admitting: Internal Medicine

## 2018-03-19 VITALS — BP 122/80 | HR 97 | Ht 66.0 in | Wt 128.0 lb

## 2018-03-19 DIAGNOSIS — J449 Chronic obstructive pulmonary disease, unspecified: Secondary | ICD-10-CM | POA: Diagnosis not present

## 2018-03-19 MED ORDER — PREDNISONE 20 MG PO TABS: 20.0000 mg | ORAL_TABLET | Freq: Every day | ORAL | 1 refills | Status: DC

## 2018-03-19 MED ORDER — ALBUTEROL SULFATE HFA 108 (90 BASE) MCG/ACT IN AERS: 2.0000 | INHALATION_SPRAY | Freq: Four times a day (QID) | RESPIRATORY_TRACT | 2 refills | Status: DC | PRN

## 2018-03-19 NOTE — Patient Instructions (Signed)
Check PFT's  Continue inhalers as prescribed  START  PREDNISONE 20 mg daily for 10 days  AVOID SECOND HAND SMOKE

## 2018-03-19 NOTE — Progress Notes (Signed)
   Name: Caroline Conley MRN: [id number] DOB: 1966-01-01    CONSULTATION DATE:  03/19/2018   REFERRING MD : [name]  CC: COPD evaluation  Previous PFT 2017 ratio 18%, Fev1 2.2L 74% fef25/75 0.78L 22%  HISTORY OF PRESENT ILLNESS: 52 yo WF with dx fo COPD Chronic SOB and DOE Still wheezing CT of the chest reviewed with patient and family at last OV  there is no lung masses noted however there is areas of emphysema in the upper lobe region bilaterally  Stopped smoking 2 weeks ago but still with second hand smoke exposure Uses advair On  spiriva Uses albuteol nebs and inhaler as needed  +wheezing Ongoing symtpoms but no signs of infection    Allergies  Allergen Reactions  . 2,4-D Dimethylamine (Amisol) Itching    (Pt unsure of this allergy)  . Morphine And Related Itching  . Vicodin [Hydrocodone-Acetaminophen] Nausea And Vomiting    GI distress    REVIEW OF SYSTEMS:   Constitutional: Negative for fever, chills, weight loss, +malaise/fatigue  HENT: Negative for hearing loss, ear pain, nosebleeds, congestion, sore throat, neck pain, tinnitus and ear discharge.   Eyes: Negative for blurred vision, double vision, photophobia, pain, discharge and redness.  Respiratory:+cough, -hemoptysis, -sputum production, +shortness of breath, +wheezing and -stridor.   Cardiovascular: Negative for chest pain, palpitations, orthopnea, claudication, leg swelling and PND.  Gastrointestinal: Negative for heartburn, nausea, vomiting, abdominal pain, diarrhea, constipation, blood in stool and melena.  Endo/Heme/Allergies: Negative for environmental allergies and polydipsia. Does not bruise/bleed easily. See HPI    VITAL SIGNS: BP 122/80 (BP Location: Left Arm, Cuff Size: Normal)   Pulse 97   Ht 5\' 6"  (1.676 m)   Wt 128 lb (58.1 kg)   SpO2 95%   BMI 20.66 kg/m   Physical Examination:  GENERAL:NAD, no fevers, chills, no weakness no fatigue HEAD: Normocephalic, atraumatic.  EYES: Pupils  equal, round, reactive to light. Extraocular muscles intact. No scleral icterus.  MOUTH: Moist mucosal membrane. Dentition intact. No abscess noted.  EAR, NOSE, THROAT: Clear without exudates. No external lesions.  NECK: Supple. No thyromegaly. No nodules. No JVD.  PULMONARY:  +wheezes CARDIOVASCULAR: S1 and S2. Regular rate and rhythm. No murmurs, rubs, or gallops. No edema. Pedal pulses 2+ bilaterally.  ALL OTHER ROS ARE NEGATIVE  ASSESSMENT / PLAN: 52 yo white female with mild/moderate COPD stage D with COPD-  COPD-moderate Ongoing wheezing and persistent cough Continue Spiriva as prescribed Continue  Advair CT chest +for emphysema Will prescribe prednisone 20 mg daily for 10 days  Deconditioned state Exercise as tolerated  COPD/wheezing -prednisone 20 mg daily for 10 days   All questions answered Follow-up in 3 months Repeat PFT's in 3 months prior to follow up   Corrin Parker, M.D.  [name]  Medical Director [place] Director [facility] Cardio-Pulmonary Department

## 2018-04-07 ENCOUNTER — Encounter: Payer: Self-pay | Admitting: Family Medicine

## 2018-04-07 ENCOUNTER — Ambulatory Visit (INDEPENDENT_AMBULATORY_CARE_PROVIDER_SITE_OTHER): Payer: Medicaid Other | Admitting: Family Medicine

## 2018-04-07 VITALS — BP 141/87 | HR 99 | Temp 98.5°F | Resp 16 | Ht 66.0 in | Wt 131.0 lb

## 2018-04-07 DIAGNOSIS — J432 Centrilobular emphysema: Secondary | ICD-10-CM | POA: Diagnosis not present

## 2018-04-07 DIAGNOSIS — R042 Hemoptysis: Secondary | ICD-10-CM | POA: Diagnosis not present

## 2018-04-07 NOTE — Progress Notes (Signed)
Subjective:    Patient ID: Caroline Conley, female    DOB: 03-23-1966, 51 y.o.   MRN: [id number]  Caroline Conley is a 52 y.o. female presenting on 04/07/2018 for Shortness of Breath (chest congestion, yellowish mucus and notice some blood onset 2 and half week)  HPI   HEMOPTYSIS / COPD Last seen by me in 3/22 for COPD flare treated with Doxycycline, continued on Advair, given Prednisone burst, and seen by Dr Mortimer Fries Pulmonology 03/19/18 w/ repeat prednisone Reports symptoms worse over past 2.5 weeks now with productive cough with streaks of blood within mucus, has not resolved yet still has episodes of hemoptysis - No prior history of hemoptysis before by her report, but chart does show previous CT checked in 08/2017 for same problem that was unremarkable, except COPD - She attempted to schedule w/ Pulm but they were unavailable until next apt 5/6 - Admits dyspnea still and frequent coughing, and hemoptysis - Denies fevers chills unintentional weight loss, chest pain   Depression screen [facility] 2/9 08/06/2017 06/07/2016  Decreased Interest 0 0  Down, Depressed, Hopeless 0 0  PHQ - 2 Score 0 0    Social History   Tobacco Use  . Smoking status: Former Smoker    Packs/day: 0.25    Years: 30.00    Pack years: 7.50    Types: Cigarettes    Last attempt to quit: 03/02/2018    Years since quitting: 0.1  . Smokeless tobacco: Former Systems developer  . Tobacco comment: 0.5 to 1 ppd >30 years  Substance Use Topics  . Alcohol use: No  . Drug use: No    Review of Systems Per HPI unless specifically indicated above     Objective:    BP (!) 141/87   Pulse 99   Temp 98.5 F (36.9 C)   Resp 16   Ht 5\' 6"  (1.676 m)   Wt 131 lb (59.4 kg)   SpO2 99%   BMI 21.14 kg/m   Wt Readings from Last 3 Encounters:  04/07/18 131 lb (59.4 kg)  03/19/18 128 lb (58.1 kg)  03/06/18 133 lb (60.3 kg)    Physical Exam  Constitutional: She is oriented to person, place, and time. She appears well-developed and  well-nourished. No distress.  Thin appearing 52 year old female, comfortable, cooperative  HENT:  Head: Normocephalic and atraumatic.  Mouth/Throat: Oropharynx is clear and moist.  Eyes: Conjunctivae are normal. Right eye exhibits no discharge. Left eye exhibits no discharge.  Neck: Normal range of motion. Neck supple. No thyromegaly present.  Cardiovascular: Regular rhythm, normal heart sounds and intact distal pulses.  No murmur heard. Mild tachycardia  Pulmonary/Chest: No respiratory distress. She has decreased breath sounds (bilateral lower lung fields) in the right lower field and the left lower field. She has wheezes in the right lower field and the left lower field. She has rhonchi in the right lower field and the left lower field. She has no rales.  No coughing. Speaks full sentences.  Musculoskeletal: Normal range of motion. She exhibits no edema.  Lymphadenopathy:    She has no cervical adenopathy.  Neurological: She is alert and oriented to person, place, and time.  Skin: Skin is warm and dry. No rash noted. She is not diaphoretic. No erythema.  Psychiatric: She has a normal mood and affect. Her behavior is normal.  Well groomed, good eye contact, normal speech and thoughts  Nursing note and vitals reviewed.      Assessment & Plan:  Problem List Items Addressed This Visit    COPD (chronic obstructive pulmonary disease) ([place]) - Primary    Suspected underlying etiology for current episode of hemoptysis, which seems mild to moderate streaks in sputum and persistent now - Recent COPD flares since 02/2018 has recurrent flares given severe COPD, requires multiple antibiotics and prednisone courses - Followed by Rafael Bihari, last seen 03/2018, next apt 5/6  Plan Reassurance given symptoms, seems likely triggered by COPD and flares w/ coughing. But cannot rule out malignancy - Due for Chest X-ray next, considered Chest CT attempted to get covered but seems will proceed w/ chest  x-ray as 1st line option then if unremarkable and her symptoms persist will proceed to Chest CT w/ contrast order - Hold antibiotics and repeat prednisone at this time - Follow-up as planned w/ Pulm 5/6      Relevant Orders   DG Chest 2 View    Other Visit Diagnoses    Cough with hemoptysis       Relevant Orders   DG Chest 2 View      No orders of the defined types were placed in this encounter.   Follow up plan: Return if symptoms worsen or fail to improve, for 1-2 week COPD / hemoptysis.   Will notify patient cannot get CT approved, proceed w/ CXR asap can return as soon as 04/08/18  [name], [facility] 04/08/2018, 12:48 AM

## 2018-04-07 NOTE — Patient Instructions (Addendum)
Thank you for coming to the office today.  Ordered CT Chest with and without contrast at [facility] - stay tuned for this to be scheduled  No new medicines today  Usually this blood tinged sputum is usually benign caused by the airways and coughing.  Follow-up with Dr Mortimer Fries as scheduled in May and follow-up on Pulmonary Function Test  Please schedule a Follow-up Appointment to: Return if symptoms worsen or fail to improve, for 1-2 week COPD / hemoptysis.  If you have any other questions or concerns, please feel free to call the office or send a message through [place]. You may also schedule an earlier appointment if necessary.  Additionally, you may be receiving a survey about your experience at our office within a few days to 1 week by e-mail or mail. We value your feedback.  [name], DO [place]

## 2018-04-08 ENCOUNTER — Telehealth: Payer: Self-pay | Admitting: Family Medicine

## 2018-04-08 ENCOUNTER — Encounter: Payer: Self-pay | Admitting: Family Medicine

## 2018-04-08 NOTE — Assessment & Plan Note (Signed)
Suspected underlying etiology for current episode of hemoptysis, which seems mild to moderate streaks in sputum and persistent now - Recent COPD flares since 02/2018 has recurrent flares given severe COPD, requires multiple antibiotics and prednisone courses - Followed by Caroline Conley, last seen 03/2018, next apt 5/6  Plan Reassurance given symptoms, seems likely triggered by COPD and flares w/ coughing. But cannot rule out malignancy - Due for Chest X-ray next, considered Chest CT attempted to get covered but seems will proceed w/ chest x-ray as 1st line option then if unremarkable and her symptoms persist will proceed to Chest CT w/ contrast order - Hold antibiotics and repeat prednisone at this time - Follow-up as planned w/ Pulm 5/6

## 2018-04-08 NOTE — Telephone Encounter (Signed)
Called patient and advised that we would need to proceed with Chest X-ray next for her hemoptysis, and based on that result then determine if can proceed with Chest CT w contrast for further eval. She will likely come today or this week to have X-ray, will follow-up with her by phone after that.  Additionally, she gave me original copy of CD X-ray images from [facility] since 2013 to 2018, in particular she is asking me to look at Right Shoulder X-rays, I viewed about 3 sets of images from 2015 to 2018, she is asking about arthritis, and I advised her that I am not officially reading or interpreting the images but it does not appear as if there is as much arthritis but I do see evidence of the prior hardware from her rotator cuff tear repair arthroscopic. 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  [name], [facility] 04/08/2018, 8:51 AM

## 2018-04-13 ENCOUNTER — Telehealth: Payer: Self-pay | Admitting: Family Medicine

## 2018-04-13 NOTE — Telephone Encounter (Signed)
Patient is requesting a call back today  Patient called back today asking to discuss with me that her insurance denied CT Chest.  We have already discussed this last week. I am not sure what her concern is now. She cannot have CT until she has Chest X-ray first. She has not returned to the office to get the Chest X-ray completed.  Once I have results of Chest X-ray then we can try again to pursue the Chest CT. And now actually at this point. She has an apt with Dr Mortimer Fries [facility] Pulmonology) on 04/20/18.  We can always send their office a message to let them know she is interested in a Chest CT for that appointment as next step if she completes the X-ray first  [name], [facility] 04/13/2018, 12:46 PM

## 2018-04-13 NOTE — Telephone Encounter (Signed)
Left message

## 2018-04-20 ENCOUNTER — Encounter: Payer: Self-pay | Admitting: Internal Medicine

## 2018-04-20 ENCOUNTER — Ambulatory Visit (INDEPENDENT_AMBULATORY_CARE_PROVIDER_SITE_OTHER): Payer: Medicaid Other | Admitting: Internal Medicine

## 2018-04-20 VITALS — BP 150/90 | HR 126 | Resp 16 | Ht 66.0 in | Wt 129.0 lb

## 2018-04-20 DIAGNOSIS — J441 Chronic obstructive pulmonary disease with (acute) exacerbation: Secondary | ICD-10-CM

## 2018-04-20 MED ORDER — BUDESONIDE 0.5 MG/2ML IN SUSP: 0.5000 mg | Freq: Two times a day (BID) | RESPIRATORY_TRACT | 11 refills | Status: DC

## 2018-04-20 MED ORDER — LEVOFLOXACIN 500 MG PO TABS: 500.0000 mg | ORAL_TABLET | Freq: Every day | ORAL | 0 refills | Status: AC

## 2018-04-20 MED ORDER — PREDNISONE 20 MG PO TABS: 40.0000 mg | ORAL_TABLET | Freq: Every day | ORAL | 0 refills | Status: DC

## 2018-04-20 NOTE — Progress Notes (Signed)
   Name: Caroline Conley MRN: [id number] DOB: [birth year]    CONSULTATION DATE:  04/20/2018   REFERRING MD : [name]  CC: COPD evaluation  Previous PFT 2017 ratio 18%, Fev1 2.2L 74% fef25/75 0.78L 22%  HISTORY OF PRESENT ILLNESS: 52 yo WF with dx fo COPD Chronic SOB and DOE Still wheezing CT of the chest reviewed with patient and family at last OV  there is no lung masses noted however there is areas of emphysema in the upper lobe region bilaterally  Stopped smoking 1 month ago but still with second hand smoke exposure Uses advair Uses albuteol nebs and inhaler as needed  +wheezing Ongoing symtpoms +productive cough Some chills    Allergies  Allergen Reactions  . 2,4-D Dimethylamine (Amisol) Itching    (Pt unsure of this allergy)  . Morphine And Related Itching  . Vicodin [Hydrocodone-Acetaminophen] Nausea And Vomiting    GI distress    REVIEW OF SYSTEMS:   Constitutional: Negative for fever, chills, weight loss, +malaise/fatigue  HENT: Negative for hearing loss, ear pain, nosebleeds, congestion, sore throat, neck pain, tinnitus and ear discharge.   Eyes: Negative for blurred vision, double vision, photophobia, pain, discharge and redness.  Respiratory:+cough, -hemoptysis, -sputum production, +shortness of breath, +wheezing and -stridor.   Cardiovascular: Negative for chest pain, palpitations, orthopnea, claudication, leg swelling and PND.  Gastrointestinal: Negative for heartburn, nausea, vomiting, abdominal pain, diarrhea, constipation, blood in stool and melena.  Endo/Heme/Allergies: Negative for environmental allergies and polydipsia. Does not bruise/bleed easily. See HPI    VITAL SIGNS: BP (!) 150/90 (BP Location: Left Arm, Cuff Size: Normal)   Pulse (!) 126   Resp 16   Ht 5\' 6"  (1.676 m)   Wt 129 lb (58.5 kg)   SpO2 93%   BMI 20.82 kg/m   Physical Examination:  GENERAL:NAD, no fevers, chills, no weakness no fatigue HEAD: Normocephalic, atraumatic.    EYES: Pupils equal, round, reactive to light. Extraocular muscles intact. No scleral icterus.  MOUTH: Moist mucosal membrane. Dentition intact. No abscess noted.  EAR, NOSE, THROAT: Clear without exudates. No external lesions.  NECK: Supple. No thyromegaly. No nodules. No JVD.  PULMONARY:  +wheezes CARDIOVASCULAR: S1 and S2. Regular rate and rhythm. No murmurs, rubs, or gallops. No edema. Pedal pulses 2+ bilaterally.  ALL OTHER ROS ARE NEGATIVE  ASSESSMENT / PLAN: 52 yo white female with mild/moderate COPD stage D with COPD-with acute mild COPD exacerabation  1.COPD-moderate Ongoing wheezing and persistent cough with COPD exacerbation -start LEvaquin 500 daily for 10 days -start Prednisone 40 mg daily for 14 days Continue  Advair   2.CT chest +for emphysema   3.Deconditioned state Exercise as tolerated   All questions answered Follow-up in 6 months   Wyat Infinger [name], M.D.  [name]  Medical Director [place] Director [facility] Cardio-Pulmonary Department

## 2018-04-20 NOTE — Patient Instructions (Addendum)
Start PULMICORT NEBS  Continue advair Prednisone 40 mg daily for 14 days Levaquin 500 mg daily for 7 days

## 2018-04-20 NOTE — Addendum Note (Signed)
Addended by: Oscar La R on: 04/20/2018 11:27 AM   Modules accepted: Orders

## 2018-04-21 ENCOUNTER — Ambulatory Visit: Payer: Medicaid Other | Admitting: Internal Medicine

## 2018-04-27 ENCOUNTER — Encounter: Payer: Self-pay | Admitting: Podiatry

## 2018-04-27 ENCOUNTER — Ambulatory Visit (INDEPENDENT_AMBULATORY_CARE_PROVIDER_SITE_OTHER): Payer: Medicaid Other | Admitting: Podiatry

## 2018-04-27 ENCOUNTER — Ambulatory Visit (INDEPENDENT_AMBULATORY_CARE_PROVIDER_SITE_OTHER): Payer: Medicaid Other

## 2018-04-27 DIAGNOSIS — M2012 Hallux valgus (acquired), left foot: Secondary | ICD-10-CM

## 2018-04-27 DIAGNOSIS — M779 Enthesopathy, unspecified: Secondary | ICD-10-CM

## 2018-04-27 DIAGNOSIS — M778 Other enthesopathies, not elsewhere classified: Secondary | ICD-10-CM

## 2018-04-27 DIAGNOSIS — M7752 Other enthesopathy of left foot: Secondary | ICD-10-CM

## 2018-04-27 MED ORDER — DICLOFENAC SODIUM 75 MG PO TBEC: 75.0000 mg | DELAYED_RELEASE_TABLET | Freq: Two times a day (BID) | ORAL | 2 refills | Status: DC

## 2018-04-30 NOTE — Progress Notes (Signed)
This encounter was created in error - please disregard.

## 2018-04-30 NOTE — Progress Notes (Signed)
Subjective:   Patient ID: Caroline Conley, female   DOB: 52 y.o.   MRN: [id number]   HPI Patient presents concerned about the possibility for inflammation and pain in the left plantar foot does not remember injury and states she is done well with the surgery F2 neurovascular status intact with inflammation around the lesser MPJs left mild in nature with no significant pain and well-healed surgical site dorsal left   ROS      Objective:  Physical Exam  Above-mentioned     Assessment:  Appears to be inflammatory condition which may be acute or may be chronic in nature     Plan:  H&P x-rays reviewed and today I went ahead and I advised this patient on anti-inflammatories and wrote for diclofenac 75 mg twice daily ice therapy and will be seen back as needed  X-ray indicates the osteotomy is healing well screws in place with no indications of pathology

## 2018-05-09 ENCOUNTER — Other Ambulatory Visit: Payer: Self-pay | Admitting: Family Medicine

## 2018-05-09 DIAGNOSIS — B370 Candidal stomatitis: Secondary | ICD-10-CM

## 2018-05-13 ENCOUNTER — Encounter: Payer: Self-pay | Admitting: Family Medicine

## 2018-05-13 ENCOUNTER — Ambulatory Visit (INDEPENDENT_AMBULATORY_CARE_PROVIDER_SITE_OTHER): Payer: Medicaid Other | Admitting: Family Medicine

## 2018-05-13 VITALS — BP 105/73 | HR 94 | Temp 98.2°F | Resp 16 | Ht 66.0 in | Wt 127.0 lb

## 2018-05-13 DIAGNOSIS — B370 Candidal stomatitis: Secondary | ICD-10-CM

## 2018-05-13 DIAGNOSIS — J441 Chronic obstructive pulmonary disease with (acute) exacerbation: Secondary | ICD-10-CM | POA: Diagnosis not present

## 2018-05-13 MED ORDER — MAGIC MOUTHWASH W/LIDOCAINE: 5.0000 mL | Freq: Three times a day (TID) | ORAL | 0 refills | Status: DC | PRN

## 2018-05-13 MED ORDER — DOXYCYCLINE HYCLATE 100 MG PO TABS: 100.0000 mg | ORAL_TABLET | Freq: Two times a day (BID) | ORAL | 0 refills | Status: DC

## 2018-05-13 MED ORDER — PREDNISONE 20 MG PO TABS: ORAL_TABLET | ORAL | 0 refills | Status: DC

## 2018-05-13 MED ORDER — HYDROCOD POLST-CPM POLST ER 10-8 MG/5ML PO SUER: 5.0000 mL | Freq: Two times a day (BID) | ORAL | 0 refills | Status: DC | PRN

## 2018-05-13 NOTE — Progress Notes (Signed)
Subjective:    Patient ID: Caroline Conley, female    DOB: [birth year], 52 y.o.   MRN: [id number]  Caroline Conley is a 52 y.o. female presenting on 05/13/2018 for COPD (SOB, wheezing, chest tightness, yellowish trace blood mucus onset 5 days)  Patient presents for a same day appointment.  HPI   COPD, Acute Exacerbation Last seen by [facility] on 04/20/18 with Dr Mortimer Fries, at that time she had mild to moderate acute COPD exacerbation, she was treated with Levaquin 500mg  daily x 10 days and then 14 day supply of Prednisone, with significant improvement, then worsening again after finished 2 weeks of prednisone 40mg  daily. Prior to that had other flare up in 04/07/18 treated by me. - Today she describes significant worsening productive cough, thick sputum yellow tastes of "salt" thinks some dehydrated, she feels overall still worse after quitting smoking but she is still smoke free. - She thinks levaquin less helpful more recently with last flare ups compared to in the past. - She was given new pulmicort neb and she is using this regularly along with other inhalers/maintenance - Requests refill on [facility]'s magic mouthwash for thrush, early symptoms recently, has refill diflucan - Admits some chest tightness with coughing and soreness - Admits some occasional blood tinged mucus for past few days - Admits cough wheezing dyspnea - Denies any headache, rash, syncope or near syncope, nausea vomiting, muscle aches, fevers chills  Depression screen [facility] 2/9 08/06/2017 06/07/2016  Decreased Interest 0 0  Down, Depressed, Hopeless 0 0  PHQ - 2 Score 0 0    Social History   Tobacco Use  . Smoking status: Former Smoker    Packs/day: 0.25    Years: 30.00    Pack years: 7.50    Types: Cigarettes    Last attempt to quit: 03/02/2018    Years since quitting: 0.1  . Smokeless tobacco: Former Systems developer  . Tobacco comment: 0.5 to 1 ppd >30 years  Substance Use Topics  . Alcohol use: No  . Drug use: No     Review of Systems Per HPI unless specifically indicated above     Objective:    BP 105/73   Pulse 94   Temp 98.2 F (36.8 C) (Oral)   Resp 16   Ht 5\' 6"  (1.676 m)   Wt 127 lb (57.6 kg)   SpO2 96%   BMI 20.50 kg/m   Wt Readings from Last 3 Encounters:  05/13/18 127 lb (57.6 kg)  04/20/18 129 lb (58.5 kg)  04/07/18 131 lb (59.4 kg)    Physical Exam  Constitutional: She is oriented to person, place, and time. She appears well-developed and well-nourished. No distress.  Thin appearing 52 year old female, comfortable, cooperative  HENT:  Head: Normocephalic and atraumatic.  Mouth/Throat: Oropharynx is clear and moist.  Eyes: Conjunctivae are normal. Right eye exhibits no discharge. Left eye exhibits no discharge.  Neck: Normal range of motion. Neck supple. No thyromegaly present.  Cardiovascular: Normal rate, regular rhythm, normal heart sounds and intact distal pulses.  No murmur heard. Pulmonary/Chest: No respiratory distress. She has decreased breath sounds (bilateral lower lung fields) in the right lower field and the left lower field. She has wheezes (diffuse end expiratory wheezes) in the right lower field and the left lower field. She has rhonchi in the right lower field and the left lower field. She has no rales.  Some coughing. Speaks full sentences.  Musculoskeletal: Normal range of motion. She exhibits no  edema.  Lymphadenopathy:    She has no cervical adenopathy.  Neurological: She is alert and oriented to person, place, and time.  Skin: Skin is warm and dry. No rash noted. She is not diaphoretic. No erythema.  Psychiatric: She has a normal mood and affect. Her behavior is normal.  Well groomed, good eye contact, normal speech and thoughts  Nursing note and vitals reviewed.      Assessment & Plan:   Problem List Items Addressed This Visit    None    Visit Diagnoses    COPD with acute exacerbation ([place])    -  Primary   Relevant Medications   doxycycline  (VIBRA-TABS) 100 MG tablet   predniSONE (DELTASONE) 20 MG tablet   magic mouthwash w/lidocaine SOLN   chlorpheniramine-HYDROcodone (TUSSIONEX PENNKINETIC ER) 10-8 MG/5ML SUER   Oral thrush       Relevant Medications   magic mouthwash w/lidocaine SOLN      Consistent with another recurrent mild to moderate acute exacerbation of COPD with worsening productive cough. Similar to prior exacerbations, last 04/2018, previously in 03/2018. - No hypoxia (96% on RA), afebrile, no recent hospitalization - Continues Advair, Pulmicort Nebulizer - Recent only improved on prolonged prednisone course, concern may soon require prolonged prednisone low dose option for chronic COPD  Plan: 1. Advised to follow-up again with [facility] for long-term plan regarding COPD - since frequent flares, I'm concerned about treatment again so soon after last flare up. - Agree to try different approach, will switch antibiotic this time to Doxycycline 100mg  BID x 7 days - has benefited from this before back in March and October - Trial on Prednisone taper this time, 7 days - may need longer chronic low dose course - Refill [facility] magic mouthwash for thrush secondary to inhaled steroid and already has Diflucan - Agree to fill one time Tussionex syrup - advised her to notify her pain management Follow-up about 1 week if not improving, otherwise strict return criteria to go to ED   Meds ordered this encounter  Medications  . doxycycline (VIBRA-TABS) 100 MG tablet    Sig: Take 1 tablet (100 mg total) by mouth 2 (two) times daily. For 7 days. Take with full glass of water, stay upright 30 min after taking.    Dispense:  14 tablet    Refill:  0  . predniSONE (DELTASONE) 20 MG tablet    Sig: Take daily with food. Start with 60mg  (3 pills) x 2 days, then reduce to 40mg  (2 pills) x 3 days, then 20mg  (1 pill) x 2 days    Dispense:  14 tablet    Refill:  0  . magic mouthwash w/lidocaine SOLN    Sig: Take 5 mLs by mouth  3 (three) times daily as needed for mouth pain. 1 Part viscous lidocaine 2%  1 Part Maalox  1 Part diphenhydramine 12.5 mg per 5 ml elixir 1 Part Nystatin for Thrush Quantity: 120 ml    Dispense:  120 mL    Refill:  0    1 Part viscous lidocaine 2%  1 Part Maalox  1 Part diphenhydramine 12.5 mg per 5 ml elixir 1 Part Nystatin for Thrush Quantity: 120 ml Sig: Swish, gargle, and spit one to two teaspoonfuls every six hours as needed. Shake well before using.  . chlorpheniramine-HYDROcodone (TUSSIONEX PENNKINETIC ER) 10-8 MG/5ML SUER    Sig: Take 5 mLs by mouth every 12 (twelve) hours as needed for cough.    Dispense:  115 mL    Refill:  0    Follow up plan: Return in about 2 weeks (around 05/27/2018), or if symptoms worsen or fail to improve, for COPD.  CC chart to Dr Mortimer Fries [facility])  [name], [facility] 05/13/2018, 9:06 AM

## 2018-05-13 NOTE — Patient Instructions (Addendum)
Thank you for coming to the office today.  1. It sounds like you had an Upper Respiratory Virus that has settled into a Bronchitis, lower respiratory tract infection. I don't have concerns for pneumonia today, and think that this should gradually improve. Once you are feeling better, the cough may take a few weeks to fully resolve. I do hear wheezing and coarse breath sounds, this may be due to the virus, also could be related to smoking.  Concern flare again after 2 weeks, stopped last prednisone - will send message to Dr Mortimer Fries maybe you need longer course at lower dose prednisone in future.  Start Doxycycline antibiotic for 7 days and Prednisone Taper for 7 days  No change to inhalers at this time  - Use nasal saline (Simply Saline or Ocean Spray) to flush nasal congestion multiple times a day, may help cough - Drink plenty of fluids to improve congestion  Refilled magic mouthwash  If your symptoms seem to worsen instead of improve over next several days, including significant fever / chills, worsening shortness of breath, worsening wheezing, or nausea / vomiting and can't take medicines - return sooner or go to hospital Emergency Department for more immediate treatment.  Please schedule a Follow-up Appointment to: Return in about 2 weeks (around 05/27/2018), or if symptoms worsen or fail to improve, for COPD.  If you have any other questions or concerns, please feel free to call the office or send a message through [facility]. You may also schedule an earlier appointment if necessary.  Additionally, you may be receiving a survey about your experience at our office within a few days to 1 week by e-mail or mail. We value your feedback.  [name], DO [place]

## 2018-05-15 ENCOUNTER — Telehealth: Payer: Self-pay | Admitting: Internal Medicine

## 2018-05-15 NOTE — Telephone Encounter (Signed)
Pt states she is wheezing and SOB for 2 weeks and has continued to get worse the last 3-4 days. Pt is seeing dr simonds Monday 6/3 at 9:30

## 2018-05-18 ENCOUNTER — Ambulatory Visit
Admission: RE | Admit: 2018-05-18 | Discharge: 2018-05-18 | Disposition: A | Discharge status: Home / Self Care | Payer: Medicaid Other | Source: Ambulatory Visit | Attending: Pulmonary Disease | Admitting: Pulmonary Disease

## 2018-05-18 ENCOUNTER — Ambulatory Visit (INDEPENDENT_AMBULATORY_CARE_PROVIDER_SITE_OTHER): Payer: Medicaid Other | Admitting: Pulmonary Disease

## 2018-05-18 ENCOUNTER — Encounter: Payer: Self-pay | Admitting: Pulmonary Disease

## 2018-05-18 VITALS — BP 144/90 | HR 120 | Ht 66.0 in | Wt 127.0 lb

## 2018-05-18 DIAGNOSIS — J441 Chronic obstructive pulmonary disease with (acute) exacerbation: Secondary | ICD-10-CM

## 2018-05-18 DIAGNOSIS — F17200 Nicotine dependence, unspecified, uncomplicated: Secondary | ICD-10-CM

## 2018-05-18 MED ORDER — IPRATROPIUM-ALBUTEROL 0.5-2.5 (3) MG/3ML IN SOLN
3.0000 mL | Freq: Once | RESPIRATORY_TRACT | Status: AC
  Administered 2018-05-18: 3 mL via RESPIRATORY_TRACT

## 2018-05-18 MED ORDER — TIOTROPIUM BROMIDE MONOHYDRATE 2.5 MCG/ACT IN AERS: 2.0000 | INHALATION_SPRAY | Freq: Every day | RESPIRATORY_TRACT | 10 refills | Status: DC

## 2018-05-18 NOTE — Telephone Encounter (Signed)
Noted. Pt has appt scheduled.

## 2018-05-18 NOTE — Addendum Note (Signed)
Addended by: Oscar La R on: 05/18/2018 10:25 AM   Modules accepted: Orders

## 2018-05-18 NOTE — Patient Instructions (Addendum)
Continue Symbicort inhaler Will add Spiriva Respimat inhaler -2 inhalations daily Continue albuterol as needed for increased shortness of breath, wheezing, chest tightness, cough Complete prednisone and doxycycline as prescribed by Dr. Raliegh Ip Chest x-ray ordered for today We will contact you at the end of this week to make certain that things are improving Follow-up with Dr. Mortimer Fries next week

## 2018-05-18 NOTE — Progress Notes (Signed)
ACUTE PULMONARY OFFICE VISIT  CHRONIC PULMONARY PROBLEMS: Smoker -abstinent x 2 months COPD, emphysema  ACUTE PROBLEM:   SUBJ: Pt of [facility]K. Last seen in this office 05/06 and was treated as AECOPD with levofloxacin and pred.  She did not feel that she got any better after that and has subsequently been initiated on doxycycline and prednisone by her primary care physician.  She presents today as an acute visit with persistent shortness of breath, chest tightness, purulent sputum, occasional scant hemoptysis, subjective fevers.  She denies lower extremity edema and calf tenderness.  Her symptoms are persistent throughout the day.  She is using her albuterol inhaler 4-6 times per day with minimal relief.  Her maintenance medications are Symbicort and nebulized budesonide (recently initiated by Dr. Mortimer Fries).  She indicates that she quit smoking approximately 2 months ago.  There is very modest exposure to secondhand smoke in the home.  She has no significant occupational or environmental exposures.   OBJ: Vitals:   05/18/18 0927  BP: (!) 144/90  Pulse: (!) 120  SpO2: 98%  Weight: 127 lb (57.6 kg)  Height: 5\' 6"  (1.676 m)  RA  No overt respiratory distress.  Able to speak in full sentences HEENT WNL Neck supple without adenopathy or jugular venous distention Chest examination with normal percussion note throughout. Diffuse coarse wheezes throughout. Tachycardic, no murmurs noted NABS, soft Extremities without clubbing cyanosis or edema No focal neurologic deficits  BMP Latest Ref Rng & Units 07/29/2017 06/24/2016 09/23/2014  Glucose 65 - 99 mg/dL 126(H) 99 92  BUN 7 - 25 mg/dL 6(L) 8 5(L)  Creatinine 0.50 - 1.05 mg/dL 1.04 1.13(H) 1.18  Sodium 135 - 146 mmol/L 140 139 140  Potassium 3.5 - 5.3 mmol/L 3.7 3.9 3.6  Chloride 98 - 110 mmol/L 105 104 107  CO2 20 - 32 mmol/L 25 27 26   Calcium 8.6 - 10.4 mg/dL 9.3 9.1 8.5    CBC Latest Ref Rng & Units 07/29/2017 06/24/2016 09/23/2014  WBC 3.8  - 10.8 K/uL 9.9 9.3 10.2  Hemoglobin 11.7 - 15.5 g/dL 13.4 12.9 12.6  Hematocrit 35.0 - 45.0 % 39.9 37.4 37.6  Platelets 140 - 400 K/uL 266 245 260    CXR: No recent exam available for my review   I have personally reviewed all chest radiographs reported above including CXRs and CT chest unless otherwise indicated  IMPRESSION:   ICD-10-CM   1. Smoker F17.200   2. Refractory COPD exacerbation J44.1 DG Chest 2 View    It is unclear why her symptoms have been so refractory despite very appropriate therapies by both Dr. Mortimer Fries and her primary physician.  PLAN:   Continue Symbicort inhaler New rx: Spiriva Respimat inhaler -2 inhalations daily Continue albuterol as needed for increased shortness of breath, wheezing, chest tightness, cough Complete prednisone and doxycycline as prescribed by Dr. Parks Ranger Chest x-ray ordered for today We will contact her at the end of this week to make certain that things are improving Follow-up with Dr. Mortimer Fries 06/13 - if not improving, would consider hospitalization   I have reviewed this patient's medical problems, current medications and therapies and prior pulmonary office notes in evaluation and formulation of the above assessment and plan    Merton Border, MD PCCM service Mobile [phone] Pager [phone] 05/18/2018 10:04 AM

## 2018-05-22 ENCOUNTER — Emergency Department: Payer: Medicaid Other

## 2018-05-22 ENCOUNTER — Other Ambulatory Visit: Payer: Self-pay

## 2018-05-22 ENCOUNTER — Inpatient Hospital Stay
Admission: EM | Admit: 2018-05-22 | Discharge: 2018-05-24 | DRG: 190 | Disposition: A | Discharge status: Home / Self Care | Payer: Medicaid Other | Attending: Internal Medicine | Admitting: Internal Medicine

## 2018-05-22 ENCOUNTER — Telehealth: Payer: Self-pay | Admitting: Internal Medicine

## 2018-05-22 DIAGNOSIS — E785 Hyperlipidemia, unspecified: Secondary | ICD-10-CM | POA: Diagnosis present

## 2018-05-22 DIAGNOSIS — Z85828 Personal history of other malignant neoplasm of skin: Secondary | ICD-10-CM | POA: Diagnosis not present

## 2018-05-22 DIAGNOSIS — Z7951 Long term (current) use of inhaled steroids: Secondary | ICD-10-CM

## 2018-05-22 DIAGNOSIS — Z885 Allergy status to narcotic agent status: Secondary | ICD-10-CM | POA: Diagnosis not present

## 2018-05-22 DIAGNOSIS — J209 Acute bronchitis, unspecified: Secondary | ICD-10-CM | POA: Diagnosis present

## 2018-05-22 DIAGNOSIS — J9601 Acute respiratory failure with hypoxia: Secondary | ICD-10-CM | POA: Diagnosis present

## 2018-05-22 DIAGNOSIS — R0781 Pleurodynia: Secondary | ICD-10-CM | POA: Diagnosis not present

## 2018-05-22 DIAGNOSIS — Z79899 Other long term (current) drug therapy: Secondary | ICD-10-CM

## 2018-05-22 DIAGNOSIS — J441 Chronic obstructive pulmonary disease with (acute) exacerbation: Secondary | ICD-10-CM

## 2018-05-22 DIAGNOSIS — J440 Chronic obstructive pulmonary disease with acute lower respiratory infection: Secondary | ICD-10-CM | POA: Diagnosis present

## 2018-05-22 DIAGNOSIS — Z789 Other specified health status: Secondary | ICD-10-CM

## 2018-05-22 DIAGNOSIS — Z87891 Personal history of nicotine dependence: Secondary | ICD-10-CM | POA: Diagnosis not present

## 2018-05-22 DIAGNOSIS — Z888 Allergy status to other drugs, medicaments and biological substances status: Secondary | ICD-10-CM | POA: Diagnosis not present

## 2018-05-22 HISTORY — DX: Chronic obstructive pulmonary disease, unspecified: J44.9

## 2018-05-22 HISTORY — DX: Emphysema, unspecified: J43.9

## 2018-05-22 LAB — COMPREHENSIVE METABOLIC PANEL
ALK PHOS: 82 U/L (ref 38–126)
ALT: 14 U/L (ref 14–54)
ANION GAP: 12 (ref 5–15)
AST: 23 U/L (ref 15–41)
Albumin: 3.7 g/dL (ref 3.5–5.0)
BILIRUBIN TOTAL: 0.4 mg/dL (ref 0.3–1.2)
BUN: 10 mg/dL (ref 6–20)
CALCIUM: 9.7 mg/dL (ref 8.9–10.3)
CO2: 25 mmol/L (ref 22–32)
CREATININE: 1.16 mg/dL — AB (ref 0.44–1.00)
Chloride: 101 mmol/L (ref 101–111)
GFR calc Af Amer: >60 mL/min (ref >60)
GFR, EST NON AFRICAN AMERICAN: 53 mL/min — AB (ref >60)
Glucose, Bld: 121 mg/dL — ABNORMAL HIGH (ref 65–99)
Potassium: 3.8 mmol/L (ref 3.5–5.1)
SODIUM: 138 mmol/L (ref 135–145)
TOTAL PROTEIN: 7.3 g/dL (ref 6.5–8.1)

## 2018-05-22 LAB — CBC WITH DIFFERENTIAL/PLATELET
Basophils Absolute: 0 10*3/uL (ref 0–0.1)
Basophils Relative: 0 %
EOS ABS: 0 10*3/uL (ref 0–0.7)
Eosinophils Relative: 0 %
HEMATOCRIT: 39.1 % (ref 35.0–47.0)
HEMOGLOBIN: 13.6 g/dL (ref 12.0–16.0)
LYMPHS ABS: 1.3 10*3/uL (ref 1.0–3.6)
LYMPHS PCT: 10 %
MCH: 32.3 pg (ref 26.0–34.0)
MCHC: 34.8 g/dL (ref 32.0–36.0)
MCV: 92.8 fL (ref 80.0–100.0)
Monocytes Absolute: 0.5 10*3/uL (ref 0.2–0.9)
Monocytes Relative: 4 %
NEUTROS PCT: 86 %
Neutro Abs: 11.3 10*3/uL — ABNORMAL HIGH (ref 1.4–6.5)
Platelets: 346 10*3/uL (ref 150–440)
RBC: 4.21 MIL/uL (ref 3.80–5.20)
RDW: 13.1 % (ref 11.5–14.5)
WBC: 13.1 10*3/uL — ABNORMAL HIGH (ref 3.6–11.0)

## 2018-05-22 LAB — TROPONIN I: Troponin I: <0.03 ng/mL (ref <0.03)

## 2018-05-22 MED ORDER — OXYCODONE HCL 5 MG PO TABS
5.0000 mg | ORAL_TABLET | Freq: Four times a day (QID) | ORAL | Status: DC | PRN
Start: 2018-05-22 — End: 2018-05-24
  Administered 2018-05-23 – 2018-05-24 (×3): 5 mg via ORAL
  Filled 2018-05-22 (×3): qty 1

## 2018-05-22 MED ORDER — HEPARIN SODIUM (PORCINE) 5000 UNIT/ML IJ SOLN
5000.0000 [IU] | Freq: Three times a day (TID) | INTRAMUSCULAR | Status: DC
  Administered 2018-05-23 – 2018-05-24 (×4): 5000 [IU] via SUBCUTANEOUS
  Filled 2018-05-22 (×4): qty 1

## 2018-05-22 MED ORDER — OXYCODONE-ACETAMINOPHEN 10-325 MG PO TABS: 1.0000 | ORAL_TABLET | Freq: Four times a day (QID) | ORAL | Status: DC | PRN

## 2018-05-22 MED ORDER — ALBUTEROL SULFATE (2.5 MG/3ML) 0.083% IN NEBU
2.5000 mg | INHALATION_SOLUTION | Freq: Once | RESPIRATORY_TRACT | Status: AC
  Administered 2018-05-22: 2.5 mg via RESPIRATORY_TRACT
  Filled 2018-05-22: qty 3

## 2018-05-22 MED ORDER — MAGNESIUM SULFATE 2 GM/50ML IV SOLN
2.0000 g | INTRAVENOUS | Status: AC
  Administered 2018-05-22: 2 g via INTRAVENOUS
  Filled 2018-05-22: qty 50

## 2018-05-22 MED ORDER — SODIUM CHLORIDE 0.9 % IV BOLUS
1000.0000 mL | Freq: Once | INTRAVENOUS | Status: AC
  Administered 2018-05-22: 1000 mL via INTRAVENOUS

## 2018-05-22 MED ORDER — OXYCODONE-ACETAMINOPHEN 5-325 MG PO TABS: 1.0000 | ORAL_TABLET | Freq: Four times a day (QID) | ORAL | Status: DC | PRN

## 2018-05-22 MED ORDER — DICLOFENAC SODIUM 75 MG PO TBEC
75.0000 mg | DELAYED_RELEASE_TABLET | Freq: Two times a day (BID) | ORAL | Status: DC
  Filled 2018-05-22: qty 1

## 2018-05-22 MED ORDER — SODIUM CHLORIDE 0.9 % IV SOLN
2.0000 g | Freq: Once | INTRAVENOUS | Status: AC
  Administered 2018-05-22: 2 g via INTRAVENOUS
  Filled 2018-05-22: qty 2

## 2018-05-22 MED ORDER — IPRATROPIUM-ALBUTEROL 0.5-2.5 (3) MG/3ML IN SOLN
3.0000 mL | RESPIRATORY_TRACT | Status: DC
  Administered 2018-05-23 – 2018-05-24 (×8): 3 mL via RESPIRATORY_TRACT
  Filled 2018-05-22 (×9): qty 3

## 2018-05-22 MED ORDER — HYDROCOD POLST-CPM POLST ER 10-8 MG/5ML PO SUER: 5.0000 mL | Freq: Two times a day (BID) | ORAL | Status: DC | PRN

## 2018-05-22 MED ORDER — METHYLPREDNISOLONE SODIUM SUCC 125 MG IJ SOLR
60.0000 mg | Freq: Four times a day (QID) | INTRAMUSCULAR | Status: DC
  Administered 2018-05-23 (×2): 60 mg via INTRAVENOUS
  Filled 2018-05-22 (×2): qty 2

## 2018-05-22 MED ORDER — IBUPROFEN 600 MG PO TABS
600.0000 mg | ORAL_TABLET | Freq: Once | ORAL | Status: AC
Start: 2018-05-22 — End: 2018-05-22
  Administered 2018-05-22: 600 mg via ORAL
  Filled 2018-05-22: qty 1

## 2018-05-22 MED ORDER — DOCUSATE SODIUM 100 MG PO CAPS: 100.0000 mg | ORAL_CAPSULE | Freq: Two times a day (BID) | ORAL | Status: DC | PRN

## 2018-05-22 MED ORDER — ACETYLCYSTEINE 10 % IN SOLN
4.0000 mL | Freq: Two times a day (BID) | RESPIRATORY_TRACT | Status: DC
  Filled 2018-05-22 (×2): qty 4

## 2018-05-22 MED ORDER — IOHEXOL 350 MG/ML SOLN
75.0000 mL | Freq: Once | INTRAVENOUS | Status: AC | PRN
  Administered 2018-05-22: 75 mL via INTRAVENOUS

## 2018-05-22 MED ORDER — IPRATROPIUM-ALBUTEROL 0.5-2.5 (3) MG/3ML IN SOLN
3.0000 mL | Freq: Once | RESPIRATORY_TRACT | Status: AC
Start: 2018-05-22 — End: 2018-05-22
  Administered 2018-05-22: 3 mL via RESPIRATORY_TRACT
  Filled 2018-05-22: qty 3

## 2018-05-22 MED ORDER — TIOTROPIUM BROMIDE MONOHYDRATE 18 MCG IN CAPS
18.0000 ug | ORAL_CAPSULE | Freq: Every day | RESPIRATORY_TRACT | Status: DC
  Administered 2018-05-23 – 2018-05-24 (×2): 18 ug via RESPIRATORY_TRACT
  Filled 2018-05-22: qty 5

## 2018-05-22 MED ORDER — VANCOMYCIN HCL IN DEXTROSE 1-5 GM/200ML-% IV SOLN
1000.0000 mg | Freq: Once | INTRAVENOUS | Status: AC
  Administered 2018-05-23: 1000 mg via INTRAVENOUS
  Filled 2018-05-22: qty 200

## 2018-05-22 MED ORDER — BUDESONIDE 0.5 MG/2ML IN SUSP
0.5000 mg | Freq: Two times a day (BID) | RESPIRATORY_TRACT | Status: DC
  Administered 2018-05-23: 0.5 mg via RESPIRATORY_TRACT
  Filled 2018-05-22: qty 2

## 2018-05-22 MED ORDER — ACETYLCYSTEINE 20 % IN SOLN
4.0000 mL | Freq: Two times a day (BID) | RESPIRATORY_TRACT | Status: DC
  Administered 2018-05-23: 02:00:00 4 mL via RESPIRATORY_TRACT
  Filled 2018-05-22 (×2): qty 4

## 2018-05-22 MED ORDER — METHYLPREDNISOLONE SODIUM SUCC 125 MG IJ SOLR
125.0000 mg | Freq: Once | INTRAMUSCULAR | Status: AC
  Administered 2018-05-22: 125 mg via INTRAVENOUS
  Filled 2018-05-22: qty 2

## 2018-05-22 NOTE — Telephone Encounter (Signed)
We can offer her another 6 day taper of prednisone to get her through until she sees Dr. Mortimer Fries. Otherwise will need to send her the the ED if does not think that she can make it that long.

## 2018-05-22 NOTE — Telephone Encounter (Signed)
Patient calling to check on status  °Please advise  °

## 2018-05-22 NOTE — ED Notes (Signed)
First Nurse Note: Pt to ED c/o Shob. Pt states that she has hx/o COPD, her pulmonologist told her to come to ED if shortness of breath got worse. Pt is in NAD at this time, able to talk in complete sentences.

## 2018-05-22 NOTE — Telephone Encounter (Signed)
Patient advised and she is going to ED now as she feels bad and does not think prednisone will help.

## 2018-05-22 NOTE — ED Triage Notes (Signed)
Pt c/o shortness of breath for the last 2 weeks - she went to PCP and got Doxycycline and prednisone - she called today to report that she was not improved and that she needed to come to the ED

## 2018-05-22 NOTE — H&P (Signed)
[facility] NAME: Caroline Conley    MR#:  [id number]  DATE OF BIRTH:  1966-03-03  DATE OF ADMISSION:  05/22/2018  PRIMARY CARE PHYSICIAN: Olin Hauser, DO   REQUESTING/REFERRING PHYSICIAN: Archie Balboa  CHIEF COMPLAINT:   Chief Complaint  Patient presents with  . Shortness of Breath    HISTORY OF PRESENT ILLNESS: Caroline Conley  is a 52 y.o. female with a known history of COPD, emphysema of lung- uses inhalers at home and nebulizer.For last few weeks she has worsening shortness of breath and cough.She went to her PMDs office, where she was given oral prednisone and Levaquin for 5 days, it did not help much so she tried to get an appointment with her pulmonologist but could not get one and was able to see her primary care and they have prescribed doxycycline plus prednisone for 5 days. Finally she was able to see her pulmonologist this week on Monday. They had advised to continue the treatment and if she does not feel better in next 3-4 days then called back the office again. She called today as she was still feeling short of breath and advised her to go to emergency room. CT scan angiogram was done which is negative for PE or pneumonia but shows bronchitis and emphysema.  PAST MEDICAL HISTORY:   Past Medical History:  Diagnosis Date  . Cancer ([place]) 01/2016   basal cell   . COPD (chronic obstructive pulmonary disease) ([place])   . Emphysema of lung ([place])   . Wears dentures    full upper and lower    PAST SURGICAL HISTORY:  Past Surgical History:  Procedure Laterality Date  . ABDOMINAL HYSTERECTOMY    . APPENDECTOMY    . BACK SURGERY     this will make third time  . CARDIAC CATHETERIZATION  09/20/14   [facility] - Dr. Clayborn Bigness  . COLONOSCOPY WITH PROPOFOL N/A 07/08/2016   Procedure: COLONOSCOPY WITH PROPOFOL;  Surgeon: Lucilla Lame, MD;  Location: [facility];  Service: Endoscopy;  Laterality: N/A;  . DEBRIDEMENT TENNIS ELBOW     . METATARSAL OSTEOTOMY  05/18/2013  . ROTATOR CUFF REPAIR    . TONSILLECTOMY      SOCIAL HISTORY:  Social History   Tobacco Use  . Smoking status: Former Smoker    Packs/day: 0.25    Years: 30.00    Pack years: 7.50    Types: Cigarettes    Last attempt to quit: 03/02/2018    Years since quitting: 0.2  . Smokeless tobacco: Former Systems developer  . Tobacco comment: 0.5 to 1 ppd >30 years  Substance Use Topics  . Alcohol use: No    FAMILY HISTORY:  Family History  Problem Relation Age of Onset  . Diabetes Mother   . High blood pressure Mother   . Gout Mother   . Cancer Father   . Stroke Father   . Arthritis/Rheumatoid Father   . Diabetes Father   . High blood pressure Father     DRUG ALLERGIES:  Allergies  Allergen Reactions  . 2,4-D Dimethylamine (Amisol) Itching    (Pt unsure of this allergy)  . Morphine And Related Itching  . Vicodin [Hydrocodone-Acetaminophen] Nausea And Vomiting    GI distress    REVIEW OF SYSTEMS:   CONSTITUTIONAL: No fever, fatigue or weakness.  EYES: No blurred or double vision.  EARS, NOSE, AND THROAT: No tinnitus or ear pain.  RESPIRATORY: have dry cough, shortness of breath,  wheezing , no hemoptysis.  CARDIOVASCULAR: No chest pain, orthopnea, edema.  GASTROINTESTINAL: No nausea, vomiting, diarrhea or abdominal pain.  GENITOURINARY: No dysuria, hematuria.  ENDOCRINE: No polyuria, nocturia,  HEMATOLOGY: No anemia, easy bruising or bleeding SKIN: No rash or lesion. MUSCULOSKELETAL: No joint pain or arthritis.   NEUROLOGIC: No tingling, numbness, weakness.  PSYCHIATRY: No anxiety or depression.   MEDICATIONS AT HOME:  Prior to Admission medications   Medication Sig Start Date End Date Taking? Authorizing Provider  albuterol (PROVENTIL HFA;VENTOLIN HFA) 108 (90 Base) MCG/ACT inhaler Inhale 2 puffs into the lungs every 6 (six) hours as needed for wheezing or shortness of breath. 03/19/18   Flora Lipps, MD  budesonide (PULMICORT) 0.5 MG/2ML  nebulizer solution Take 2 mLs (0.5 mg total) by nebulization 2 (two) times daily. 04/20/18 04/20/19  Flora Lipps, MD  chlorpheniramine-HYDROcodone (TUSSIONEX PENNKINETIC ER) 10-8 MG/5ML SUER Take 5 mLs by mouth every 12 (twelve) hours as needed for cough. 05/13/18   Karamalegos, Devonne Doughty, DO  diclofenac (VOLTAREN) 75 MG EC tablet Take 1 tablet (75 mg total) by mouth 2 (two) times daily. 04/27/18   Wallene Huh, DPM  doxycycline (VIBRA-TABS) 100 MG tablet Take 1 tablet (100 mg total) by mouth 2 (two) times daily. For 7 days. Take with full glass of water, stay upright 30 min after taking. 05/13/18   Karamalegos, Devonne Doughty, DO  fluconazole (DIFLUCAN) 200 MG tablet TAKE 1 TABLET BY MOUTH ONCE DAILY AS NEEDED FOR YEAST MAY REPEAT DOSEIN 48 HOURS 05/12/18   Karamalegos, Devonne Doughty, DO  Fluticasone-Salmeterol (ADVAIR) 250-50 MCG/DOSE AEPB Inhale 1 puff into the lungs 2 (two) times daily. 02/05/18   Flora Lipps, MD  magic mouthwash w/lidocaine SOLN Take 5 mLs by mouth 3 (three) times daily as needed for mouth pain. 1 Part viscous lidocaine 2%  1 Part Maalox  1 Part diphenhydramine 12.5 mg per 5 ml elixir 1 Part Nystatin for Thrush Quantity: 120 ml 05/13/18   Karamalegos, Devonne Doughty, DO  oxyCODONE-acetaminophen (PERCOCET) 10-325 MG tablet oxycodone-acetaminophen 10 mg-325 mg tablet  1 QIDPRN Fill on 07/30/2017    [provider]  predniSONE (DELTASONE) 20 MG tablet Take daily with food. Start with 60mg  (3 pills) x 2 days, then reduce to 40mg  (2 pills) x 3 days, then 20mg  (1 pill) x 2 days 05/13/18   Olin Hauser, DO  Tiotropium Bromide Monohydrate (SPIRIVA RESPIMAT) 2.5 MCG/ACT AERS Inhale 2 puffs into the lungs daily. 05/18/18   Wilhelmina Mcardle, MD      PHYSICAL EXAMINATION:   VITAL SIGNS: Blood pressure 137/90, pulse 92, temperature 98.4 F (36.9 C), resp. rate 18, height 5\' 6"  (1.676 m), weight 55.8 kg (123 lb), SpO2 (!) 89 %.  GENERAL:  52 y.o.-year-old patient lying in the  bed with no acute distress.  EYES: Pupils equal, round, reactive to light and accommodation. No scleral icterus. Extraocular muscles intact.  HEENT: Head atraumatic, normocephalic. Oropharynx and nasopharynx clear.  NECK:  Supple, no jugular venous distention. No thyroid enlargement, no tenderness.  LUNGS: decreased breath sounds bilaterally, no wheezing, no crepitation. No use of accessory muscles of respiration.  CARDIOVASCULAR: S1, S2 normal. No murmurs, rubs, or gallops.  ABDOMEN: Soft, nontender, nondistended. Bowel sounds present. No organomegaly or mass.  EXTREMITIES: No pedal edema, cyanosis, or clubbing.  NEUROLOGIC: Cranial nerves II through XII are intact. Muscle strength 5/5 in all extremities. Sensation intact. Gait not checked.  PSYCHIATRIC: The patient is alert and oriented x 3.  SKIN: No obvious  rash, lesion, or ulcer.   LABORATORY PANEL:   CBC Recent Labs  Lab 05/22/18 1637  WBC 13.1*  HGB 13.6  HCT 39.1  PLT 346  MCV 92.8  MCH 32.3  MCHC 34.8  RDW 13.1  LYMPHSABS 1.3  MONOABS 0.5  EOSABS 0.0  BASOSABS 0.0   ------------------------------------------------------------------------------------------------------------------  Chemistries  Recent Labs  Lab 05/22/18 1637  NA 138  K 3.8  CL 101  CO2 25  GLUCOSE 121*  BUN 10  CREATININE 1.16*  CALCIUM 9.7  AST 23  ALT 14  ALKPHOS 82  BILITOT 0.4   ------------------------------------------------------------------------------------------------------------------ estimated creatinine clearance is 50 mL/min (A) (by C-G formula based on SCr of 1.16 mg/dL (H)). ------------------------------------------------------------------------------------------------------------------ No results for input(s): TSH, T4TOTAL, T3FREE, THYROIDAB in the last 72 hours.  Invalid input(s): FREET3   Coagulation profile No results for input(s): INR, PROTIME in the last 168  hours. ------------------------------------------------------------------------------------------------------------------- No results for input(s): DDIMER in the last 72 hours. -------------------------------------------------------------------------------------------------------------------  Cardiac Enzymes Recent Labs  Lab 05/22/18 1637  TROPONINI <0.03   ------------------------------------------------------------------------------------------------------------------ Invalid input(s): POCBNP  ---------------------------------------------------------------------------------------------------------------  Urinalysis    Component Value Date/Time   COLORURINE YELLOW 10/17/2011 1212   APPEARANCEUR HAZY (A) 10/17/2011 1212   LABSPEC 1.028 10/17/2011 1212   PHURINE 5.0 10/17/2011 1212   GLUCOSEU NEGATIVE 10/17/2011 1212   HGBUR TRACE (A) 10/17/2011 1212   BILIRUBINUR NEGATIVE 10/17/2011 1212   KETONESUR NEGATIVE 10/17/2011 1212   PROTEINUR NEGATIVE 10/17/2011 1212   UROBILINOGEN 0.2 10/17/2011 1212   NITRITE NEGATIVE 10/17/2011 1212   LEUKOCYTESUR NEGATIVE 10/17/2011 1212     RADIOLOGY: Dg Chest 2 View  Result Date: 05/22/2018 CLINICAL DATA:  Shortness of breath for the past 2 weeks. EXAM: CHEST - 2 VIEW COMPARISON:  Chest x-rays dated May 18, 2018, and August 27, 2017. FINDINGS: The heart size and mediastinal contours are within normal limits. Normal pulmonary vascularity. Diffusely coarsened interstitial markings, similar to prior study. Resolved opacity in the left lower lobe, unchanged. No pleural effusion or pneumothorax. No acute osseous abnormality. IMPRESSION: 1. No active cardiopulmonary disease. Resolved left lower lobe infiltrate. Electronically Signed   By: Titus Dubin M.D.   On: 05/22/2018 17:16   Ct Angio Chest Pe W And/or Wo Contrast  Result Date: 05/22/2018 CLINICAL DATA:  52 year old female with shortness of breath and a clinical history of COPD. EXAM: CT  ANGIOGRAPHY CHEST WITH CONTRAST TECHNIQUE: Multidetector CT imaging of the chest was performed using the standard protocol during bolus administration of intravenous contrast. Multiplanar CT image reconstructions and MIPs were obtained to evaluate the vascular anatomy. CONTRAST:  66mL OMNIPAQUE IOHEXOL 350 MG/ML SOLN COMPARISON:  Prior CT scan of the chest 08/27/2017 FINDINGS: Cardiovascular: Adequate opacification of the pulmonary arteries to the proximal segmental level. No evidence of central filling defect to suggest acute pulmonary embolus. The heart is normal in size. No pericardial effusion. Normal caliber aorta without evidence of aneurysm. Average right subclavian artery noted incidentally. Mild fibrofatty atherosclerotic plaque at the origin of the left subclavian artery results in mild to moderate stenosis. Mediastinum/Nodes: Unremarkable CT appearance of the thyroid gland. No suspicious mediastinal or hilar adenopathy. No soft tissue mediastinal mass. The thoracic esophagus is unremarkable. Lungs/Pleura: Moderately severe combined paraseptal and centrilobular pulmonary emphysema. No evidence of focal airspace consolidation, pulmonary edema, pleural effusion or pneumothorax. No suspicious pulmonary mass or nodule. Diffuse mild bronchial wall thickening. Upper Abdomen: Visualized upper abdominal organs are unremarkable. Musculoskeletal: No acute fracture or aggressive appearing lytic or blastic osseous lesion. Review  of the MIP images confirms the above findings. IMPRESSION: 1. Negative for acute pulmonary embolus, pneumonia or other acute cardiopulmonary process. 2. Moderately severe combined centrilobular and paraseptal pulmonary emphysema as well as diffuse mild bronchial wall thickening consistent with the clinical history of COPD. 3. Average right subclavian artery noted incidentally. Aortic Atherosclerosis (ICD10-I70.0) and Emphysema (ICD10-J43.9). Electronically Signed   By: Jacqulynn Cadet M.D.    On: 05/22/2018 20:54    EKG: Orders placed or performed during the hospital encounter of 05/22/18  . ED EKG within 10 minutes  . ED EKG within 10 minutes  . EKG 12-Lead  . EKG 12-Lead    IMPRESSION AND PLAN:  * acute COPD exacerbation   Ac respi failure with hypoxia.   IV steroid, nebulizer therapy, Spiriva   Inhale steroid.   Will check MRSA screening.   Patient has received Levaquin and doxycycline for 5 days each as outpatient.   She is given cefepime and vancomycin 1 dose in ER.   Clinic to decide further antibiotics depending on MRSA screen.   Pulmonary consult for further management.   Give Mucomyst to help secretions.   All the records are reviewed and case discussed with ED provider. Management plans discussed with the patient, family and they are in agreement.  CODE STATUS: full. Code Status History    Date Active Date Inactive Code Status Order ID Comments User Context   10/21/2011 1912 10/23/2011 1614 Full Code [id number]  Alferd Patee, RN Inpatient       TOTAL TIME TAKING CARE OF THIS PATIENT: 45 minutes.    Vaughan Basta M.D on 05/22/2018   Between 7am to 6pm - Pager - [phone]  After 6pm go to [email or web address] - password [email or web address] [facility]  Office  [phone]  CC: Primary care physician; Olin Hauser, DO   Note: This dictation was prepared with Dragon dictation along with smaller phrase technology. Any transcriptional errors that result from this process are unintentional.

## 2018-05-22 NOTE — ED Notes (Signed)
Spoke with pt about wait times and what to expect next. Advised pt that I am available for further questions if needed.  

## 2018-05-22 NOTE — ED Provider Notes (Signed)
[facility] Emergency Department Provider Note  ____________________________________________  Time seen: Approximately 8:32 PM  I have reviewed the triage vital signs and the nursing notes.   HISTORY  Chief Complaint Shortness of Breath    HPI Caroline Conley is a 52 y.o. female with a history of emphysema and basal cell carcinoma complains of shortness of breath and productive cough for the past 2-3 weeks. She seen her pulmonologist 2 times and her primary care doctor as well. He took 2 courses of antibiotics with Levaquin and doxycycline as well as courses of prednisone. Throughout this she is not improved. She has sharp chest pain with coughing but otherwise denies chest pain. No fever, chills no sweats. No weight changes. No aggravating or alleviating factors. Rates her symptoms as severe      Past Medical History:  Diagnosis Date  . Cancer ([place]) 01/2016   basal cell   . COPD (chronic obstructive pulmonary disease) ([place])   . Emphysema of lung ([place])   . Wears dentures    full upper and lower     Patient Active Problem List   Diagnosis Date Noted  . Full thickness rotator cuff tear 02/05/2018  . Shoulder joint pain 02/05/2018  . Chronic pain of both shoulders 01/01/2018  . Chronic bursitis of right shoulder 01/01/2018  . Frozen shoulder 01/01/2018  . Personal history of malignant melanoma 07/28/2017  . Hyperlipidemia 07/28/2017  . Elevated serum creatinine 07/28/2017  . HAV (hallux abducto valgus) 08/02/2016  . Special screening for malignant neoplasms, colon   . COPD (chronic obstructive pulmonary disease) ([place]) 06/07/2016  . Angular cheilitis 06/07/2016  . Tobacco abuse 06/07/2016  . Melanoma of buttock ([place]) 08/30/2015  . Acid indigestion 09/07/2014  . Dyspepsia 09/07/2014     Past Surgical History:  Procedure Laterality Date  . ABDOMINAL HYSTERECTOMY    . APPENDECTOMY    . BACK SURGERY     this will make third time  . CARDIAC  CATHETERIZATION  09/20/14   [facility] - Dr. Clayborn Bigness  . COLONOSCOPY WITH PROPOFOL N/A 07/08/2016   Procedure: COLONOSCOPY WITH PROPOFOL;  Surgeon: Lucilla Lame, MD;  Location: [facility];  Service: Endoscopy;  Laterality: N/A;  . DEBRIDEMENT TENNIS ELBOW    . METATARSAL OSTEOTOMY  05/18/2013  . ROTATOR CUFF REPAIR    . TONSILLECTOMY       Prior to Admission medications   Medication Sig Start Date End Date Taking? Authorizing Provider  albuterol (PROVENTIL HFA;VENTOLIN HFA) 108 (90 Base) MCG/ACT inhaler Inhale 2 puffs into the lungs every 6 (six) hours as needed for wheezing or shortness of breath. 03/19/18   Flora Lipps, MD  budesonide (PULMICORT) 0.5 MG/2ML nebulizer solution Take 2 mLs (0.5 mg total) by nebulization 2 (two) times daily. 04/20/18 04/20/19  Flora Lipps, MD  chlorpheniramine-HYDROcodone (TUSSIONEX PENNKINETIC ER) 10-8 MG/5ML SUER Take 5 mLs by mouth every 12 (twelve) hours as needed for cough. 05/13/18   Karamalegos, Devonne Doughty, DO  diclofenac (VOLTAREN) 75 MG EC tablet Take 1 tablet (75 mg total) by mouth 2 (two) times daily. 04/27/18   Wallene Huh, DPM  doxycycline (VIBRA-TABS) 100 MG tablet Take 1 tablet (100 mg total) by mouth 2 (two) times daily. For 7 days. Take with full glass of water, stay upright 30 min after taking. 05/13/18   Karamalegos, Devonne Doughty, DO  fluconazole (DIFLUCAN) 200 MG tablet TAKE 1 TABLET BY MOUTH ONCE DAILY AS NEEDED FOR YEAST MAY REPEAT DOSEIN 48 HOURS 05/12/18  Karamalegos, Alexander J, DO  Fluticasone-Salmeterol (ADVAIR) 250-50 MCG/DOSE AEPB Inhale 1 puff into the lungs 2 (two) times daily. 02/05/18   Flora Lipps, MD  magic mouthwash w/lidocaine SOLN Take 5 mLs by mouth 3 (three) times daily as needed for mouth pain. 1 Part viscous lidocaine 2%  1 Part Maalox  1 Part diphenhydramine 12.5 mg per 5 ml elixir 1 Part Nystatin for Thrush Quantity: 120 ml 05/13/18   Karamalegos, Devonne Doughty, DO  oxyCODONE-acetaminophen (PERCOCET) 10-325 MG tablet  oxycodone-acetaminophen 10 mg-325 mg tablet  1 QIDPRN Fill on 07/30/2017    [provider]  predniSONE (DELTASONE) 20 MG tablet Take daily with food. Start with 60mg  (3 pills) x 2 days, then reduce to 40mg  (2 pills) x 3 days, then 20mg  (1 pill) x 2 days 05/13/18   Olin Hauser, DO  Tiotropium Bromide Monohydrate (SPIRIVA RESPIMAT) 2.5 MCG/ACT AERS Inhale 2 puffs into the lungs daily. 05/18/18   Wilhelmina Mcardle, MD     Allergies 2,4-d dimethylamine (amisol); Morphine and related; and Vicodin [hydrocodone-acetaminophen]   Family History  Problem Relation Age of Onset  . Diabetes Mother   . High blood pressure Mother   . Gout Mother   . Cancer Father   . Stroke Father   . Arthritis/Rheumatoid Father   . Diabetes Father   . High blood pressure Father     Social History Social History   Tobacco Use  . Smoking status: Former Smoker    Packs/day: 0.25    Years: 30.00    Pack years: 7.50    Types: Cigarettes    Last attempt to quit: 03/02/2018    Years since quitting: 0.2  . Smokeless tobacco: Former Systems developer  . Tobacco comment: 0.5 to 1 ppd >30 years  Substance Use Topics  . Alcohol use: No  . Drug use: No    Review of Systems  Constitutional:   No fever positive chills.  ENT:   No sore throat. No rhinorrhea. Cardiovascular:  positive pleuritic chest pain without syncope. Respiratory:   positive shortness of breath and productive cough. Gastrointestinal:   Negative for abdominal pain, vomiting and diarrhea.  Musculoskeletal:   Negative for focal pain or swelling All other systems reviewed and are negative except as documented above in ROS and HPI.  ____________________________________________   PHYSICAL EXAM:  VITAL SIGNS: ED Triage Vitals  Enc Vitals Group     BP 05/22/18 1634 (!) 152/91     Pulse Rate 05/22/18 1634 94     Resp 05/22/18 1634 16     Temp 05/22/18 1634 98.4 F (36.9 C)     Temp src --      SpO2 05/22/18 1634 94 %     Weight  05/22/18 1635 123 lb (55.8 kg)     Height 05/22/18 1635 5\' 6"  (1.676 m)     Head Circumference --      Peak Flow --      Pain Score 05/22/18 1635 9     Pain Loc --      Pain Edu? --      Excl. in [place]? --     Vital signs reviewed, nursing assessments reviewed.   Constitutional:   Alert and oriented. ill-appearing Eyes:   Conjunctivae are normal. EOMI. PERRL. ENT      Head:   Normocephalic and atraumatic.      Nose:   No congestion/rhinnorhea.       Mouth/Throat:   MMM, no pharyngeal erythema. No peritonsillar  mass.       Neck:   No meningismus. Full ROM. Hematological/Lymphatic/Immunilogical:   No cervical lymphadenopathy. Cardiovascular:   tachycardia heart rate 105. Symmetric bilateral radial and DP pulses.  No murmurs.  Respiratory:   tachypnea. Diffuse expiratory wheezing with prolonged expiratory phase. No focal crackles. Gastrointestinal:   Soft and nontender. Non distended. There is no CVA tenderness.  No rebound, rigidity, or guarding.  Musculoskeletal:   Normal range of motion in all extremities. No joint effusions.  No lower extremity tenderness.  No edema. Neurologic:   Normal speech and language.  Motor grossly intact. No acute focal neurologic deficits are appreciated.  Skin:    Skin is warm, dry and intact. No rash noted.  No petechiae, purpura, or bullae.  ____________________________________________    LABS (pertinent positives/negatives) (all labs ordered are listed, but only abnormal results are displayed) Labs Reviewed  CBC WITH DIFFERENTIAL/PLATELET - Abnormal; Notable for the following components:      Result Value   WBC 13.1 (*)    Neutro Abs 11.3 (*)    All other components within normal limits  COMPREHENSIVE METABOLIC PANEL - Abnormal; Notable for the following components:   Glucose, Bld 121 (*)    Creatinine, Ser 1.16 (*)    GFR calc non Af Amer 53 (*)    All other components within normal limits  TROPONIN I    ____________________________________________   EKG  interpreted by me Normal sinus rhythm rate of 91, normal axis intervals. Normal QRS ST segments and T waves.  ____________________________________________    RADIOLOGY  Dg Chest 2 View  Result Date: 05/22/2018 CLINICAL DATA:  Shortness of breath for the past 2 weeks. EXAM: CHEST - 2 VIEW COMPARISON:  Chest x-rays dated May 18, 2018, and August 27, 2017. FINDINGS: The heart size and mediastinal contours are within normal limits. Normal pulmonary vascularity. Diffusely coarsened interstitial markings, similar to prior study. Resolved opacity in the left lower lobe, unchanged. No pleural effusion or pneumothorax. No acute osseous abnormality. IMPRESSION: 1. No active cardiopulmonary disease. Resolved left lower lobe infiltrate. Electronically Signed   By: Titus Dubin M.D.   On: 05/22/2018 17:16    ____________________________________________   PROCEDURES Procedures  ____________________________________________  DIFFERENTIAL DIAGNOSIS   persistent COPD exacerbation, healthcare associated pneumonia, outpatient treatment failure, pulmonary embolism  CLINICAL IMPRESSION / ASSESSMENT AND PLAN / ED COURSE  Pertinent labs & imaging results that were available during my care of the patient were reviewed by me and considered in my medical decision making (see chart for details).    patient presents with persistent difficulty breathing and increased work of breathing.some hypoxia on room air. I will start vancomycin and cefepime for presumed healthcare associated pneumonia. Patient is not septic. Due to the persistence of her symptoms despite prolonged antibiotic and steroid courses, I will obtain a CT scan of the chest to evaluate for pulmonary embolism. Patient will need to be hospitalized for control of her symptoms at this point given the outpatient treatment failure.       ____________________________________________   FINAL CLINICAL IMPRESSION(S) / ED DIAGNOSES    Final diagnoses:  COPD exacerbation ([place])  Failure of outpatient treatment     ED Discharge Orders    None      Portions of this note were generated with dragon dictation software. Dictation errors may occur despite best attempts at proofreading.    Carrie Mew, MD 05/22/18 2037

## 2018-05-22 NOTE — Telephone Encounter (Signed)
Please advise on message below.

## 2018-05-22 NOTE — ED Notes (Signed)
Patient transported to CT 

## 2018-05-22 NOTE — Telephone Encounter (Signed)
Patient states at last visit with Dr. Alva Garnet she was told to call back if not doing any better Patient states her lungs are not doing any better and she may need to be admitted Please call to discuss

## 2018-05-23 DIAGNOSIS — J441 Chronic obstructive pulmonary disease with (acute) exacerbation: Principal | ICD-10-CM

## 2018-05-23 LAB — BASIC METABOLIC PANEL
ANION GAP: 9 (ref 5–15)
BUN: 11 mg/dL (ref 6–20)
CHLORIDE: 105 mmol/L (ref 101–111)
CO2: 23 mmol/L (ref 22–32)
Calcium: 9 mg/dL (ref 8.9–10.3)
Creatinine, Ser: 1 mg/dL (ref 0.44–1.00)
GFR calc Af Amer: >60 mL/min (ref >60)
GFR calc non Af Amer: >60 mL/min (ref >60)
GLUCOSE: 124 mg/dL — AB (ref 65–99)
Potassium: 3.8 mmol/L (ref 3.5–5.1)
Sodium: 137 mmol/L (ref 135–145)

## 2018-05-23 LAB — CBC
HEMATOCRIT: 37.3 % (ref 35.0–47.0)
HEMOGLOBIN: 13.2 g/dL (ref 12.0–16.0)
MCH: 32.7 pg (ref 26.0–34.0)
MCHC: 35.4 g/dL (ref 32.0–36.0)
MCV: 92.4 fL (ref 80.0–100.0)
Platelets: 285 10*3/uL (ref 150–440)
RBC: 4.03 MIL/uL (ref 3.80–5.20)
RDW: 13.3 % (ref 11.5–14.5)
WBC: 11.5 10*3/uL — ABNORMAL HIGH (ref 3.6–11.0)

## 2018-05-23 LAB — PROCALCITONIN

## 2018-05-23 LAB — MRSA PCR SCREENING: MRSA by PCR: NEGATIVE

## 2018-05-23 MED ORDER — MAGNESIUM SULFATE 2 GM/50ML IV SOLN: 2.0000 g | Freq: Once | INTRAVENOUS | Status: DC

## 2018-05-23 MED ORDER — BUDESONIDE 0.5 MG/2ML IN SUSP
0.5000 mg | Freq: Two times a day (BID) | RESPIRATORY_TRACT | Status: DC
  Administered 2018-05-23 – 2018-05-24 (×3): 0.5 mg via RESPIRATORY_TRACT
  Filled 2018-05-23 (×3): qty 2

## 2018-05-23 MED ORDER — KETOROLAC TROMETHAMINE 15 MG/ML IJ SOLN
15.0000 mg | Freq: Once | INTRAMUSCULAR | Status: AC
  Administered 2018-05-23: 20:00:00 15 mg via INTRAVENOUS
  Filled 2018-05-23: qty 1

## 2018-05-23 MED ORDER — ACETAMINOPHEN 325 MG PO TABS
650.0000 mg | ORAL_TABLET | Freq: Four times a day (QID) | ORAL | Status: DC | PRN
Start: 2018-05-23 — End: 2018-05-24

## 2018-05-23 MED ORDER — METHYLPREDNISOLONE SODIUM SUCC 40 MG IJ SOLR
40.0000 mg | Freq: Three times a day (TID) | INTRAMUSCULAR | Status: DC
  Administered 2018-05-23 – 2018-05-24 (×3): 40 mg via INTRAVENOUS
  Filled 2018-05-23 (×3): qty 1

## 2018-05-23 MED ORDER — ACETYLCYSTEINE 20 % IN SOLN
4.0000 mL | Freq: Two times a day (BID) | RESPIRATORY_TRACT | Status: DC
  Administered 2018-05-23 – 2018-05-24 (×3): 4 mL via RESPIRATORY_TRACT
  Filled 2018-05-23 (×3): qty 4

## 2018-05-23 MED ORDER — LEVOFLOXACIN IN D5W 500 MG/100ML IV SOLN
500.0000 mg | INTRAVENOUS | Status: DC
  Administered 2018-05-23: 14:00:00 500 mg via INTRAVENOUS
  Filled 2018-05-23 (×2): qty 100

## 2018-05-23 NOTE — Progress Notes (Signed)
[facility] [place] NAME: Caroline Conley    MR#:  [id number]  DATE OF BIRTH:  Sep 09, 1966  SUBJECTIVE:   Eating better this morning.  Still with cough  REVIEW OF SYSTEMS:    Review of Systems  Constitutional: Negative for fever, chills weight loss HENT: Negative for ear pain, nosebleeds, congestion, facial swelling, rhinorrhea, neck pain, neck stiffness and ear discharge.   Respiratory: Positive for cough, shortness of breath, wheezing  Cardiovascular: Negative for chest pain, palpitations and leg swelling.  Gastrointestinal: Negative for heartburn, abdominal pain, vomiting, diarrhea or consitpation Genitourinary: Negative for dysuria, urgency, frequency, hematuria Musculoskeletal: Negative for back pain or joint pain Neurological: Negative for dizziness, seizures, syncope, focal weakness,  numbness and headaches.  Hematological: Does not bruise/bleed easily.  Psychiatric/Behavioral: Negative for hallucinations, confusion, dysphoric mood    Tolerating Diet: yes      DRUG ALLERGIES:   Allergies  Allergen Reactions  . 2,4-D Dimethylamine (Amisol) Itching    (Pt unsure of this allergy)  . Morphine And Related Itching  . Vicodin [Hydrocodone-Acetaminophen] Nausea And Vomiting    GI distress    VITALS:  Blood pressure 137/85, pulse 75, temperature 98.3 F (36.8 C), temperature source Oral, resp. rate 18, height 5\' 6"  (1.676 m), weight 60.3 kg (132 lb 14.4 oz), SpO2 90 %.  PHYSICAL EXAMINATION:  Constitutional: Appears well-developed and well-nourished. No distress. HENT: Normocephalic. Marland Kitchen Oropharynx is clear and moist.  Eyes: Conjunctivae and EOM are normal. PERRLA, no scleral icterus.  Neck: Normal ROM. Neck supple. No JVD. No tracheal deviation. CVS: RRR, S1/S2 +, no murmurs, no gallops, no carotid bruit.  Pulmonary: Normal respiratory effort with bilateral prolonged expiratory wheezing abdominal: Soft. BS +,  no distension,  tenderness, rebound or guarding.  Musculoskeletal: Normal range of motion. No edema and no tenderness.  Neuro: Alert. CN 2-12 grossly intact. No focal deficits. Skin: Skin is warm and dry. No rash noted. Psychiatric: Normal mood and affect.      LABORATORY PANEL:   CBC Recent Labs  Lab 05/23/18 0438  WBC 11.5*  HGB 13.2  HCT 37.3  PLT 285   ------------------------------------------------------------------------------------------------------------------  Chemistries  Recent Labs  Lab 05/22/18 1637 05/23/18 0438  NA 138 137  K 3.8 3.8  CL 101 105  CO2 25 23  GLUCOSE 121* 124*  BUN 10 11  CREATININE 1.16* 1.00  CALCIUM 9.7 9.0  AST 23  --   ALT 14  --   ALKPHOS 82  --   BILITOT 0.4  --    ------------------------------------------------------------------------------------------------------------------  Cardiac Enzymes Recent Labs  Lab 05/22/18 1637  TROPONINI <0.03   ------------------------------------------------------------------------------------------------------------------  RADIOLOGY:  Dg Chest 2 View  Result Date: 05/22/2018 CLINICAL DATA:  Shortness of breath for the past 2 weeks. EXAM: CHEST - 2 VIEW COMPARISON:  Chest x-rays dated May 18, 2018, and August 27, 2017. FINDINGS: The heart size and mediastinal contours are within normal limits. Normal pulmonary vascularity. Diffusely coarsened interstitial markings, similar to prior study. Resolved opacity in the left lower lobe, unchanged. No pleural effusion or pneumothorax. No acute osseous abnormality. IMPRESSION: 1. No active cardiopulmonary disease. Resolved left lower lobe infiltrate. Electronically Signed   By: Titus Dubin M.D.   On: 05/22/2018 17:16   Ct Angio Chest Pe W And/or Wo Contrast  Result Date: 05/22/2018 CLINICAL DATA:  52 year old female with shortness of breath and a clinical history of COPD. EXAM: CT ANGIOGRAPHY CHEST WITH CONTRAST TECHNIQUE: Multidetector CT imaging of  the chest  was performed using the standard protocol during bolus administration of intravenous contrast. Multiplanar CT image reconstructions and MIPs were obtained to evaluate the vascular anatomy. CONTRAST:  43mL OMNIPAQUE IOHEXOL 350 MG/ML SOLN COMPARISON:  Prior CT scan of the chest 08/27/2017 FINDINGS: Cardiovascular: Adequate opacification of the pulmonary arteries to the proximal segmental level. No evidence of central filling defect to suggest acute pulmonary embolus. The heart is normal in size. No pericardial effusion. Normal caliber aorta without evidence of aneurysm. Average right subclavian artery noted incidentally. Mild fibrofatty atherosclerotic plaque at the origin of the left subclavian artery results in mild to moderate stenosis. Mediastinum/Nodes: Unremarkable CT appearance of the thyroid gland. No suspicious mediastinal or hilar adenopathy. No soft tissue mediastinal mass. The thoracic esophagus is unremarkable. Lungs/Pleura: Moderately severe combined paraseptal and centrilobular pulmonary emphysema. No evidence of focal airspace consolidation, pulmonary edema, pleural effusion or pneumothorax. No suspicious pulmonary mass or nodule. Diffuse mild bronchial wall thickening. Upper Abdomen: Visualized upper abdominal organs are unremarkable. Musculoskeletal: No acute fracture or aggressive appearing lytic or blastic osseous lesion. Review of the MIP images confirms the above findings. IMPRESSION: 1. Negative for acute pulmonary embolus, pneumonia or other acute cardiopulmonary process. 2. Moderately severe combined centrilobular and paraseptal pulmonary emphysema as well as diffuse mild bronchial wall thickening consistent with the clinical history of COPD. 3. Average right subclavian artery noted incidentally. Aortic Atherosclerosis (ICD10-I70.0) and Emphysema (ICD10-J43.9). Electronically Signed   By: Jacqulynn Cadet M.D.   On: 05/22/2018 20:54     ASSESSMENT AND PLAN:   52 year old female with  history of COPD who was treated as an outpatient for COPD exacerbation and failed outpatient treatment who presents with shortness of breath and cough.  1.  Acute hypoxic respiratory failure in the setting of acute exacerbation of COPD Patient weaned from BiPAP to nasal cannula May need oxygen upon discharge CT scan was negative for PE or evidence of pneumonia  2.  Acute exacerbation of COPD: Patient symptoms have improved. Wean IV steroids Continue inhaler nebulizer treatment Pulmonary consultation        Management plans discussed with the patient and she is in agreement.  CODE STATUS: full  TOTAL TIME TAKING CARE OF THIS PATIENT: 30 minutes.     POSSIBLE D/C tomorrow, DEPENDING ON CLINICAL CONDITION.   Estella Malatesta M.D on 05/23/2018 at 8:41 AM  Between 7am to 6pm - Pager - [phone] After 6pm go to [email or web address] - password [email or web address] [facility]  Office  [phone]  CC: Primary care physician; Olin Hauser, DO  Note: This dictation was prepared with Dragon dictation along with smaller phrase technology. Any transcriptional errors that result from this process are unintentional.

## 2018-05-23 NOTE — Consult Note (Signed)
[facility] Consultation    ASSESSMENT/PLAN   COPD exacerbation. Failed outpatient therapy. Presently she is on Solu-Medrol, albuterol, Atrovent, budesonide, Mucomyst as needed. As she improves would resume Spiriva, Advair which short-acting bronchodilator therapy. Although no clear evidence of pneumonia would give her a course of antibiotics as this will hasten recovery for COPD exacerbation.  Name: Caroline Conley MRN: [id number] DOB: 1966-05-22    ADMISSION DATE:  05/22/2018 CONSULTATION DATE:  05/22/2018  REFERRING MD :  Dr. Benjie Karvonen  CHIEF COMPLAINT:  Shortness of Breath   HISTORY OF PRESENT ILLNESS:  Caroline Conley is a very pleasant 52 year old Caucasian female with a past medical history remarkable for COPD/emphysema, known patient of Dr. Marzetta Merino followed in the outpatient setting. Her home inhaler regimen includes Spiriva, Advair, rescue short-acting bronchodilators. Has had a recent COPD exacerbation in the outpatient setting which has not responded to therapy. She subsequently presented to the emergency department for admission to the hospital. CT scan of the chest revealed central lobular emphysema without any evidence of pulmonary embolism, no clear pneumonia noted on parenchymal lung windows. She states this morning she is feeling better. She has not required oxygen in the past and is presently on room air  PAST MEDICAL HISTORY :  Past Medical History:  Diagnosis Date  . Cancer ([place]) 01/2016   basal cell   . COPD (chronic obstructive pulmonary disease) ([place])   . Emphysema of lung ([place])   . Wears dentures    full upper and lower   Past Surgical History:  Procedure Laterality Date  . ABDOMINAL HYSTERECTOMY    . APPENDECTOMY    . BACK SURGERY     this will make third time  . CARDIAC CATHETERIZATION  09/20/14   [facility] - Dr. Clayborn Bigness  . COLONOSCOPY WITH PROPOFOL N/A 07/08/2016   Procedure: COLONOSCOPY WITH PROPOFOL;  Surgeon: Lucilla Lame, MD;  Location: [facility];  Service: Endoscopy;  Laterality: N/A;  . DEBRIDEMENT TENNIS ELBOW    . METATARSAL OSTEOTOMY  05/18/2013  . ROTATOR CUFF REPAIR    . TONSILLECTOMY     Prior to Admission medications   Medication Sig Start Date End Date Taking? Authorizing Provider  albuterol (PROVENTIL HFA;VENTOLIN HFA) 108 (90 Base) MCG/ACT inhaler Inhale 2 puffs into the lungs every 6 (six) hours as needed for wheezing or shortness of breath. 03/19/18  Yes Kasa, Maretta Bees, MD  budesonide (PULMICORT) 0.5 MG/2ML nebulizer solution Take 2 mLs (0.5 mg total) by nebulization 2 (two) times daily. 04/20/18 04/20/19 Yes Kasa, Maretta Bees, MD  Fluticasone-Salmeterol (ADVAIR) 250-50 MCG/DOSE AEPB Inhale 1 puff into the lungs 2 (two) times daily. 02/05/18  Yes Flora Lipps, MD  magic mouthwash w/lidocaine SOLN Take 5 mLs by mouth 3 (three) times daily as needed for mouth pain. 1 Part viscous lidocaine 2%  1 Part Maalox  1 Part diphenhydramine 12.5 mg per 5 ml elixir 1 Part Nystatin for Thrush Quantity: 120 ml 05/13/18  Yes Karamalegos, Devonne Doughty, DO  oxyCODONE-acetaminophen (PERCOCET) 10-325 MG tablet oxycodone-acetaminophen 10 mg-325 mg tablet  1 QIDPRN Fill on 07/30/2017   Yes [provider]  Tiotropium Bromide Monohydrate (SPIRIVA RESPIMAT) 2.5 MCG/ACT AERS Inhale 2 puffs into the lungs daily. 05/18/18  Yes Wilhelmina Mcardle, MD  chlorpheniramine-HYDROcodone (TUSSIONEX PENNKINETIC ER) 10-8 MG/5ML SUER Take 5 mLs by mouth every 12 (twelve) hours as needed for cough. Patient not taking: Reported on 05/22/2018 05/13/18   Olin Hauser, DO  diclofenac (VOLTAREN) 75 MG EC tablet Take 1  tablet (75 mg total) by mouth 2 (two) times daily. Patient not taking: Reported on 05/22/2018 04/27/18   Wallene Huh, DPM  doxycycline (VIBRA-TABS) 100 MG tablet Take 1 tablet (100 mg total) by mouth 2 (two) times daily. For 7 days. Take with full glass of water, stay upright 30 min after taking. Patient not taking: Reported on 05/22/2018  05/13/18   Olin Hauser, DO  fluconazole (DIFLUCAN) 200 MG tablet TAKE 1 TABLET BY MOUTH ONCE DAILY AS NEEDED FOR YEAST MAY REPEAT DOSEIN 48 HOURS 05/12/18   Karamalegos, Devonne Doughty, DO  predniSONE (DELTASONE) 20 MG tablet Take daily with food. Start with 60mg  (3 pills) x 2 days, then reduce to 40mg  (2 pills) x 3 days, then 20mg  (1 pill) x 2 days Patient not taking: Reported on 05/22/2018 05/13/18   Olin Hauser, DO   Allergies  Allergen Reactions  . 2,4-D Dimethylamine (Amisol) Itching    (Pt unsure of this allergy)  . Morphine And Related Itching  . Vicodin [Hydrocodone-Acetaminophen] Nausea And Vomiting    GI distress    FAMILY HISTORY:  Family History  Problem Relation Age of Onset  . Diabetes Mother   . High blood pressure Mother   . Gout Mother   . Cancer Father   . Stroke Father   . Arthritis/Rheumatoid Father   . Diabetes Father   . High blood pressure Father    SOCIAL HISTORY:  reports that she quit smoking about 2 months ago. Her smoking use included cigarettes. She has a 7.50 pack-year smoking history. She has never used smokeless tobacco. She reports that she does not drink alcohol or use drugs.  REVIEW OF SYSTEMS:      The remainder of systems were reviewed and were found to be negative other than what is documented in the HPI.    VITAL SIGNS: Temp:  [97.9 F (36.6 C)-98.4 F (36.9 C)] 98.3 F (36.8 C) (06/08 0409) Pulse Rate:  [75-97] 75 (06/08 0439) Resp:  [15-22] 18 (06/08 0439) BP: (137-156)/(77-93) 137/85 (06/08 0409) SpO2:  [89 %-96 %] 90 % (06/08 0720) Weight:  [55.8 kg (123 lb)-60.3 kg (132 lb 14.4 oz)] 60.3 kg (132 lb 14.4 oz) (06/07 2313) HEMODYNAMICS:   VENTILATOR SETTINGS:   INTAKE / OUTPUT: No intake or output data in the 24 hours ending 05/23/18 1006  Physical Examination:   VS: BP 137/85 (BP Location: Right Arm)   Pulse 75   Temp 98.3 F (36.8 C) (Oral)   Resp 18   Ht 5\' 6"  (1.676 m)   Wt 60.3 kg (132 lb 14.4  oz)   SpO2 90%   BMI 21.45 kg/m   General Appearance: No distress  Neuro:without focal findings, mental status, speech normal,. HEENT: PERRLA, EOM intact, no ptosis, no other lesions noticed;  Pulmonary: Prolonged expiratory phase and, diffuse wheezing with coarse rhonchi CardiovascularNormal S1,S2.  No m/r/g.    Abdomen: Benign, Soft, non-tender, No masses, hepatosplenomegaly, No lymphadenopathy Skin:   warm, no rashes, no ecchymosis  Extremities: normal, no cyanosis, clubbing, no edema, warm with normal capillary refill.    LABS: Reviewed   LABORATORY PANEL:   CBC Recent Labs  Lab 05/23/18 0438  WBC 11.5*  HGB 13.2  HCT 37.3  PLT 285    Chemistries  Recent Labs  Lab 05/22/18 1637 05/23/18 0438  NA 138 137  K 3.8 3.8  CL 101 105  CO2 25 23  GLUCOSE 121* 124*  BUN 10 11  CREATININE 1.16* 1.00  CALCIUM 9.7 9.0  AST 23  --   ALT 14  --   ALKPHOS 82  --   BILITOT 0.4  --     No results for input(s): GLUCAP in the last 168 hours. No results for input(s): PHART, PCO2ART, PO2ART in the last 168 hours. Recent Labs  Lab 05/22/18 1637  AST 23  ALT 14  ALKPHOS 82  BILITOT 0.4  ALBUMIN 3.7    Cardiac Enzymes Recent Labs  Lab 05/22/18 1637  TROPONINI <0.03    RADIOLOGY:  Dg Chest 2 View  Result Date: 05/22/2018 CLINICAL DATA:  Shortness of breath for the past 2 weeks. EXAM: CHEST - 2 VIEW COMPARISON:  Chest x-rays dated May 18, 2018, and August 27, 2017. FINDINGS: The heart size and mediastinal contours are within normal limits. Normal pulmonary vascularity. Diffusely coarsened interstitial markings, similar to prior study. Resolved opacity in the left lower lobe, unchanged. No pleural effusion or pneumothorax. No acute osseous abnormality. IMPRESSION: 1. No active cardiopulmonary disease. Resolved left lower lobe infiltrate. Electronically Signed   By: Titus Dubin M.D.   On: 05/22/2018 17:16   Ct Angio Chest Pe W And/or Wo Contrast  Result Date:  05/22/2018 CLINICAL DATA:  52 year old female with shortness of breath and a clinical history of COPD. EXAM: CT ANGIOGRAPHY CHEST WITH CONTRAST TECHNIQUE: Multidetector CT imaging of the chest was performed using the standard protocol during bolus administration of intravenous contrast. Multiplanar CT image reconstructions and MIPs were obtained to evaluate the vascular anatomy. CONTRAST:  80mL OMNIPAQUE IOHEXOL 350 MG/ML SOLN COMPARISON:  Prior CT scan of the chest 08/27/2017 FINDINGS: Cardiovascular: Adequate opacification of the pulmonary arteries to the proximal segmental level. No evidence of central filling defect to suggest acute pulmonary embolus. The heart is normal in size. No pericardial effusion. Normal caliber aorta without evidence of aneurysm. Average right subclavian artery noted incidentally. Mild fibrofatty atherosclerotic plaque at the origin of the left subclavian artery results in mild to moderate stenosis. Mediastinum/Nodes: Unremarkable CT appearance of the thyroid gland. No suspicious mediastinal or hilar adenopathy. No soft tissue mediastinal mass. The thoracic esophagus is unremarkable. Lungs/Pleura: Moderately severe combined paraseptal and centrilobular pulmonary emphysema. No evidence of focal airspace consolidation, pulmonary edema, pleural effusion or pneumothorax. No suspicious pulmonary mass or nodule. Diffuse mild bronchial wall thickening. Upper Abdomen: Visualized upper abdominal organs are unremarkable. Musculoskeletal: No acute fracture or aggressive appearing lytic or blastic osseous lesion. Review of the MIP images confirms the above findings. IMPRESSION: 1. Negative for acute pulmonary embolus, pneumonia or other acute cardiopulmonary process. 2. Moderately severe combined centrilobular and paraseptal pulmonary emphysema as well as diffuse mild bronchial wall thickening consistent with the clinical history of COPD. 3. Average right subclavian artery noted incidentally. Aortic  Atherosclerosis (ICD10-I70.0) and Emphysema (ICD10-J43.9). Electronically Signed   By: Jacqulynn Cadet M.D.   On: 05/22/2018 20:54    Hermelinda Dellen, DO  05/23/2018, 10:06 AM

## 2018-05-24 LAB — HIV ANTIBODY (ROUTINE TESTING W REFLEX): HIV Screen 4th Generation wRfx: NONREACTIVE

## 2018-05-24 MED ORDER — PREDNISONE 10 MG PO TABS: 50.0000 mg | ORAL_TABLET | Freq: Every day | ORAL | 0 refills | Status: DC

## 2018-05-24 MED ORDER — LEVOFLOXACIN 750 MG PO TABS: 750.0000 mg | ORAL_TABLET | Freq: Every day | ORAL | 0 refills | Status: DC

## 2018-05-24 MED ORDER — IPRATROPIUM-ALBUTEROL 0.5-2.5 (3) MG/3ML IN SOLN
3.0000 mL | Freq: Four times a day (QID) | RESPIRATORY_TRACT | Status: DC
Start: 2018-05-24 — End: 2018-05-24

## 2018-05-24 NOTE — Progress Notes (Signed)
Pt was ambulated around the nurses station, to the visitors entrance and back to the medical mall and then back to room. Pt oxygen did not drop below 92% during ambulation, however, her HR went as high as 145. Pt HR did not sustain and went down to 99 at rest. No signs of distress observed during ambulation and after ambulation. Will continue to monitor.

## 2018-05-24 NOTE — Discharge Summary (Signed)
[facility] NAME: Caroline Conley    MR#:  [id number]  DATE OF BIRTH:  1966/06/10  DATE OF ADMISSION:  05/22/2018 ADMITTING PHYSICIAN: Vaughan Basta, MD  DATE OF DISCHARGE: 05/24/2018  PRIMARY CARE PHYSICIAN: Olin Hauser, DO    ADMISSION DIAGNOSIS:  COPD exacerbation ([place]) [J44.1] Failure of outpatient treatment [Z78.9]  DISCHARGE DIAGNOSIS:  Principal Problem:   COPD with acute exacerbation ([place]) Active Problems:   COPD exacerbation ([place])   SECONDARY DIAGNOSIS:   Past Medical History:  Diagnosis Date  . Cancer ([place]) 01/2016   basal cell   . COPD (chronic obstructive pulmonary disease) ([place])   . Emphysema of lung ([place])   . Wears dentures    full upper and lower    HOSPITAL COURSE:  52 year old female with a remote history of smoking and COPD who presents to the emergency room due to shortness of breath.  1.  Acute hypoxic respiratory failure in the setting of acute COPD exacerbation with acute bronchitis.  Patient has been weaned to room air.  2.  Acute exacerbation of COPD with acute bronchitis: Patient symptoms have improved with IV steroids.  She will be discharged on oral prednisone and Levaquin.  She will follow-up with her pulmonologist at the end of the week.  She was evaluated by pulmonary while in the hospital.   DISCHARGE CONDITIONS AND DIET:   Stable for discharge on regular diet  CONSULTS OBTAINED:    DRUG ALLERGIES:   Allergies  Allergen Reactions  . 2,4-D Dimethylamine (Amisol) Itching    (Pt unsure of this allergy)  . Morphine And Related Itching  . Vicodin [Hydrocodone-Acetaminophen] Nausea And Vomiting    GI distress    DISCHARGE MEDICATIONS:   Allergies as of 05/24/2018      Reactions   2,4-d Dimethylamine (amisol) Itching   (Pt unsure of this allergy)   Morphine And Related Itching   Vicodin [hydrocodone-acetaminophen] Nausea And Vomiting   GI distress       Medication List    STOP taking these medications   chlorpheniramine-HYDROcodone 10-8 MG/5ML Suer Commonly known as:  TUSSIONEX PENNKINETIC ER   diclofenac 75 MG EC tablet Commonly known as:  VOLTAREN   doxycycline 100 MG tablet Commonly known as:  VIBRA-TABS     TAKE these medications   albuterol 108 (90 Base) MCG/ACT inhaler Commonly known as:  PROVENTIL HFA;VENTOLIN HFA Inhale 2 puffs into the lungs every 6 (six) hours as needed for wheezing or shortness of breath.   budesonide 0.5 MG/2ML nebulizer solution Commonly known as:  PULMICORT Take 2 mLs (0.5 mg total) by nebulization 2 (two) times daily.   fluconazole 200 MG tablet Commonly known as:  DIFLUCAN TAKE 1 TABLET BY MOUTH ONCE DAILY AS NEEDED FOR YEAST MAY REPEAT DOSEIN 48 HOURS   Fluticasone-Salmeterol 250-50 MCG/DOSE Aepb Commonly known as:  ADVAIR Inhale 1 puff into the lungs 2 (two) times daily.   levofloxacin 750 MG tablet Commonly known as:  LEVAQUIN Take 1 tablet (750 mg total) by mouth daily.   magic mouthwash w/lidocaine Soln Take 5 mLs by mouth 3 (three) times daily as needed for mouth pain. 1 Part viscous lidocaine 2%  1 Part Maalox  1 Part diphenhydramine 12.5 mg per 5 ml elixir 1 Part Nystatin for Thrush Quantity: 120 ml   oxyCODONE-acetaminophen 10-325 MG tablet Commonly known as:  PERCOCET oxycodone-acetaminophen 10 mg-325 mg tablet  1 QIDPRN Fill on 07/30/2017   predniSONE 10 MG  tablet Commonly known as:  DELTASONE Take 5 tablets (50 mg total) by mouth daily with breakfast for 4 days. What changed:    medication strength  how much to take  how to take this  when to take this  additional instructions   Tiotropium Bromide Monohydrate 2.5 MCG/ACT Aers Commonly known as:  SPIRIVA RESPIMAT Inhale 2 puffs into the lungs daily.         Today   CHIEF COMPLAINT:  Patient is doing well this morning.  She is ready to be discharged home.   VITAL SIGNS:  Blood pressure 120/86,  pulse 98, temperature 98.7 F (37.1 C), temperature source Oral, resp. rate 16, height 5\' 6"  (1.676 m), weight 60.3 kg (132 lb 14.4 oz), SpO2 93 %.   REVIEW OF SYSTEMS:  ROS     General ROS: negative  Review of Systems - Respiratory ROS: no cough, shortness of breath, or wheezing  Review of Systems - Cardiovascular ROS: no chest pain or dyspnea on exertion  Review of Systems - Gastrointestinal ROS: no abdominal pain, change in bowel habits, or black or bloody stools  Review of Systems - Musculoskeletal ROS: negative        PHYSICAL EXAMINATION:  GENERAL:  52 y.o.-year-old patient lying in the bed with no acute distress.  NECK:  Supple, no jugular venous distention. No thyroid enlargement, no tenderness.  LUNGS: Normal breath sounds bilaterally, no wheezing, rales,rhonchi  No use of accessory muscles of respiration.  CARDIOVASCULAR: S1, S2 normal. No murmurs, rubs, or gallops.  ABDOMEN: Soft, non-tender, non-distended. Bowel sounds present. No organomegaly or mass.  EXTREMITIES: No pedal edema, cyanosis, or clubbing.  PSYCHIATRIC: The patient is alert and oriented x 3.  SKIN: No obvious rash, lesion, or ulcer.   DATA REVIEW:   CBC Recent Labs  Lab 05/23/18 0438  WBC 11.5*  HGB 13.2  HCT 37.3  PLT 285    Chemistries  Recent Labs  Lab 05/22/18 1637 05/23/18 0438  NA 138 137  K 3.8 3.8  CL 101 105  CO2 25 23  GLUCOSE 121* 124*  BUN 10 11  CREATININE 1.16* 1.00  CALCIUM 9.7 9.0  AST 23  --   ALT 14  --   ALKPHOS 82  --   BILITOT 0.4  --     Cardiac Enzymes Recent Labs  Lab 05/22/18 1637  TROPONINI <0.03    Microbiology Results  @[email or web address] @  RADIOLOGY:  Dg Chest 2 View  Result Date: 05/22/2018 CLINICAL DATA:  Shortness of breath for the past 2 weeks. EXAM: CHEST - 2 VIEW COMPARISON:  Chest x-rays dated May 18, 2018, and August 27, 2017. FINDINGS: The heart size and mediastinal contours are within normal limits. Normal pulmonary vascularity.  Diffusely coarsened interstitial markings, similar to prior study. Resolved opacity in the left lower lobe, unchanged. No pleural effusion or pneumothorax. No acute osseous abnormality. IMPRESSION: 1. No active cardiopulmonary disease. Resolved left lower lobe infiltrate. Electronically Signed   By: Titus Dubin M.D.   On: 05/22/2018 17:16   Ct Angio Chest Pe W And/or Wo Contrast  Result Date: 05/22/2018 CLINICAL DATA:  52 year old female with shortness of breath and a clinical history of COPD. EXAM: CT ANGIOGRAPHY CHEST WITH CONTRAST TECHNIQUE: Multidetector CT imaging of the chest was performed using the standard protocol during bolus administration of intravenous contrast. Multiplanar CT image reconstructions and MIPs were obtained to evaluate the vascular anatomy. CONTRAST:  34mL OMNIPAQUE IOHEXOL 350 MG/ML SOLN COMPARISON:  Prior CT scan  of the chest 08/27/2017 FINDINGS: Cardiovascular: Adequate opacification of the pulmonary arteries to the proximal segmental level. No evidence of central filling defect to suggest acute pulmonary embolus. The heart is normal in size. No pericardial effusion. Normal caliber aorta without evidence of aneurysm. Average right subclavian artery noted incidentally. Mild fibrofatty atherosclerotic plaque at the origin of the left subclavian artery results in mild to moderate stenosis. Mediastinum/Nodes: Unremarkable CT appearance of the thyroid gland. No suspicious mediastinal or hilar adenopathy. No soft tissue mediastinal mass. The thoracic esophagus is unremarkable. Lungs/Pleura: Moderately severe combined paraseptal and centrilobular pulmonary emphysema. No evidence of focal airspace consolidation, pulmonary edema, pleural effusion or pneumothorax. No suspicious pulmonary mass or nodule. Diffuse mild bronchial wall thickening. Upper Abdomen: Visualized upper abdominal organs are unremarkable. Musculoskeletal: No acute fracture or aggressive appearing lytic or blastic  osseous lesion. Review of the MIP images confirms the above findings. IMPRESSION: 1. Negative for acute pulmonary embolus, pneumonia or other acute cardiopulmonary process. 2. Moderately severe combined centrilobular and paraseptal pulmonary emphysema as well as diffuse mild bronchial wall thickening consistent with the clinical history of COPD. 3. Average right subclavian artery noted incidentally. Aortic Atherosclerosis (ICD10-I70.0) and Emphysema (ICD10-J43.9). Electronically Signed   By: Jacqulynn Cadet M.D.   On: 05/22/2018 20:54      Allergies as of 05/24/2018      Reactions   2,4-d Dimethylamine (amisol) Itching   (Pt unsure of this allergy)   Morphine And Related Itching   Vicodin [hydrocodone-acetaminophen] Nausea And Vomiting   GI distress      Medication List    STOP taking these medications   chlorpheniramine-HYDROcodone 10-8 MG/5ML Suer Commonly known as:  TUSSIONEX PENNKINETIC ER   diclofenac 75 MG EC tablet Commonly known as:  VOLTAREN   doxycycline 100 MG tablet Commonly known as:  VIBRA-TABS     TAKE these medications   albuterol 108 (90 Base) MCG/ACT inhaler Commonly known as:  PROVENTIL HFA;VENTOLIN HFA Inhale 2 puffs into the lungs every 6 (six) hours as needed for wheezing or shortness of breath.   budesonide 0.5 MG/2ML nebulizer solution Commonly known as:  PULMICORT Take 2 mLs (0.5 mg total) by nebulization 2 (two) times daily.   fluconazole 200 MG tablet Commonly known as:  DIFLUCAN TAKE 1 TABLET BY MOUTH ONCE DAILY AS NEEDED FOR YEAST MAY REPEAT DOSEIN 48 HOURS   Fluticasone-Salmeterol 250-50 MCG/DOSE Aepb Commonly known as:  ADVAIR Inhale 1 puff into the lungs 2 (two) times daily.   levofloxacin 750 MG tablet Commonly known as:  LEVAQUIN Take 1 tablet (750 mg total) by mouth daily.   magic mouthwash w/lidocaine Soln Take 5 mLs by mouth 3 (three) times daily as needed for mouth pain. 1 Part viscous lidocaine 2%  1 Part Maalox  1 Part  diphenhydramine 12.5 mg per 5 ml elixir 1 Part Nystatin for Thrush Quantity: 120 ml   oxyCODONE-acetaminophen 10-325 MG tablet Commonly known as:  PERCOCET oxycodone-acetaminophen 10 mg-325 mg tablet  1 QIDPRN Fill on 07/30/2017   predniSONE 10 MG tablet Commonly known as:  DELTASONE Take 5 tablets (50 mg total) by mouth daily with breakfast for 4 days. What changed:    medication strength  how much to take  how to take this  when to take this  additional instructions   Tiotropium Bromide Monohydrate 2.5 MCG/ACT Aers Commonly known as:  SPIRIVA RESPIMAT Inhale 2 puffs into the lungs daily.        Management plans discussed with the patient and  she is in agreement. Stable for discharge home  Patient should follow up with pcp  CODE STATUS:     Code Status Orders  (From admission, onward)        Start     Ordered   05/22/18 2326  Full code  Continuous     05/22/18 2325    Code Status History    Date Active Date Inactive Code Status Order ID Comments User Context   10/21/2011 1912 10/23/2011 1614 Full Code [id number]  Alferd Patee, RN Inpatient      TOTAL TIME TAKING CARE OF THIS PATIENT: 38 minutes.    Note: This dictation was prepared with Dragon dictation along with smaller phrase technology. Any transcriptional errors that result from this process are unintentional.  Cheyenna Pankowski M.D on 05/24/2018 at 8:31 AM  Between 7am to 6pm - Pager - [phone] After 6pm go to [email or web address] - password [email or web address] [facility]  Office  [phone]  CC: Primary care physician; Olin Hauser, DO

## 2018-05-24 NOTE — Progress Notes (Signed)
Pt D/C to home by husband. IV removed intact. Education given. All questions answered. Prescriptions given to pt. VSS.

## 2018-05-25 ENCOUNTER — Telehealth: Payer: Self-pay

## 2018-05-25 NOTE — Telephone Encounter (Signed)
I have made the 1st attempt to contact the patient or family member in charge, in order to follow up from recently being discharged from the hospital. I was unable to leave a message on voicemail but I will make another attempt at a different time.  

## 2018-05-25 NOTE — Telephone Encounter (Signed)
I have made the 2nd attempt to contact the patient or family member in charge, in order to follow up from recently being discharged from the hospital.  

## 2018-05-26 ENCOUNTER — Other Ambulatory Visit: Payer: Self-pay | Admitting: *Deleted

## 2018-05-26 DIAGNOSIS — J449 Chronic obstructive pulmonary disease, unspecified: Secondary | ICD-10-CM

## 2018-05-28 ENCOUNTER — Encounter: Payer: Self-pay | Admitting: Internal Medicine

## 2018-05-28 ENCOUNTER — Telehealth: Payer: Self-pay | Admitting: Internal Medicine

## 2018-05-28 ENCOUNTER — Ambulatory Visit (INDEPENDENT_AMBULATORY_CARE_PROVIDER_SITE_OTHER): Payer: Medicaid Other | Admitting: Internal Medicine

## 2018-05-28 VITALS — Ht 66.0 in | Wt 131.0 lb

## 2018-05-28 DIAGNOSIS — J449 Chronic obstructive pulmonary disease, unspecified: Secondary | ICD-10-CM

## 2018-05-28 MED ORDER — HYDROCOD POLST-CPM POLST ER 10-8 MG/5ML PO SUER: 5.0000 mL | Freq: Two times a day (BID) | ORAL | 0 refills | Status: DC | PRN

## 2018-05-28 MED ORDER — BUDESONIDE 0.5 MG/2ML IN SUSP: 0.5000 mg | Freq: Two times a day (BID) | RESPIRATORY_TRACT | 11 refills | Status: DC

## 2018-05-28 NOTE — Patient Instructions (Signed)
Stop second hand smoke exposure continue inhalers as prescribed

## 2018-05-28 NOTE — Telephone Encounter (Signed)
I sent pulmicort nebs and that's the one I saw on dC summary

## 2018-05-28 NOTE — Telephone Encounter (Signed)
What inhaler were you suppose to send in?

## 2018-05-28 NOTE — Telephone Encounter (Signed)
Pt informed Pulmicort is the same medication and has been sent in to the pharmacy. Nothing further needed.

## 2018-05-28 NOTE — Progress Notes (Signed)
   Name: Caroline Conley MRN: [id number] DOB: August 02, 1966    CONSULTATION DATE:  05/28/2018   REFERRING MD : [name]  CC: COPD evaluation  Previous PFT 2017 ratio 18%, Fev1 2.2L 74% fef25/75 0.78L 22%  HISTORY OF PRESENT ILLNESS: 52 yo WF with dx fo COPD Chronic SOB and DOE Still wheezing CT of the chest reviewed with patient and family at last OV  there is no lung masses noted however there is areas of emphysema in the upper lobe region bilaterally  Stopped smoking 1 month ago but still with second hand smoke exposure Uses advair Uses albuteol nebs and inhaler as needed  +wheezing Ongoing symtpoms +productive cough Some chills  recent admission for COPD exacerbation Given IV abx and IV steroids CXR with resolving LLL infiltrate   I have Independently reviewed images of  CXR 6.7.19 Interpretation:resolving LLL infiltrate  Needs cardiology referral for tachyarrythmias   Allergies  Allergen Reactions  . 2,4-D Dimethylamine (Amisol) Itching    (Pt unsure of this allergy)  . Morphine And Related Itching  . Vicodin [Hydrocodone-Acetaminophen] Nausea And Vomiting    GI distress   REVIEW OF SYSTEMS:   Constitutional: Negative for fever, chills, weight loss, +malaise/fatigue  HENT: Negative for hearing loss, ear pain, nosebleeds, congestion, sore throat, neck pain, tinnitus and ear discharge.   Eyes: Negative for blurred vision, double vision, photophobia, pain, discharge and redness.  Respiratory:+cough, -hemoptysis, -sputum production, +shortness of breath, +wheezing and -stridor.   Cardiovascular: Negative for chest pain, palpitations, orthopnea, claudication, leg swelling and PND.  Gastrointestinal: Negative for heartburn, nausea, vomiting, abdominal pain, diarrhea, constipation, blood in stool and melena.  Endo/Heme/Allergies: Negative for environmental allergies and polydipsia. Does not bruise/bleed easily. See HPI     VITAL SIGNS: Ht 5\' 6"  (1.676 m)   Wt  131 lb (59.4 kg)   BMI 21.14 kg/m  132/82 HR 116 95%o2sat  Physical Examination:  GENERAL:NAD, no fevers, chills, no weakness no fatigue HEAD: Normocephalic, atraumatic.  EYES: Pupils equal, round, reactive to light. Extraocular muscles intact. No scleral icterus.  MOUTH: Moist mucosal membrane. Dentition intact. No abscess noted.  EAR, NOSE, THROAT: Clear without exudates. No external lesions.  NECK: Supple. No thyromegaly. No nodules. No JVD.  PULMONARY:  +wheezes CARDIOVASCULAR: S1 and S2. Regular rate and rhythm. No murmurs, rubs, or gallops. No edema. Pedal pulses 2+ bilaterally.  ALL OTHER ROS ARE NEGATIVE    ASSESSMENT / PLAN: 52 yo white female with mild/moderate COPD stage D with COPD-with acute mild COPD exacerabation Slowly resolving with time and abx and steroids  1.COPD-moderate Ongoing wheezing and persistent cough with COPD exacerbation Continue  Advair Continue Symbicort inhaler New rx: Spiriva Respimat inhaler -2 inhalations daily Continue albuterol as needed for increased shortness of breath, wheezing, chest tightness, cough Add pulmicort nebs BID and assess after 30 days tussionex for couigh  2.CT chest +for emphysema   3.Deconditioned state Exercise as tolerated   All questions answered Follow-up in 3 months   Alveta Quintela [name], M.D.  [name]  Medical Director [place] Director [facility] Cardio-Pulmonary Department

## 2018-05-28 NOTE — Telephone Encounter (Signed)
Patient saw Mortimer Fries today and says he sent in the wrong inhaler. Patient would like to use the one that she had while admitted to hospital.  She says its the one that smells like rotten eggs .

## 2018-06-04 ENCOUNTER — Other Ambulatory Visit: Payer: Self-pay

## 2018-06-04 DIAGNOSIS — J449 Chronic obstructive pulmonary disease, unspecified: Secondary | ICD-10-CM

## 2018-07-14 ENCOUNTER — Telehealth: Payer: Self-pay | Admitting: Family Medicine

## 2018-07-14 DIAGNOSIS — Z85820 Personal history of malignant melanoma of skin: Secondary | ICD-10-CM

## 2018-07-14 NOTE — Telephone Encounter (Signed)
Pt  Called states that she need a referral to  Dr Evorn Gong [facility]. Pt  Call back # is  [phone]

## 2018-07-14 NOTE — Telephone Encounter (Signed)
Reviewed chart to find diagnosis and reason for referral.  She has a history of malignant melanoma and has been followed by Dr Evorn Gong in the past. Will place referral to renew her follow-up.  [name], [facility] 07/14/2018, 12:45 PM

## 2018-07-16 ENCOUNTER — Encounter: Payer: Self-pay | Admitting: Internal Medicine

## 2018-07-16 ENCOUNTER — Ambulatory Visit (INDEPENDENT_AMBULATORY_CARE_PROVIDER_SITE_OTHER): Payer: Medicaid Other | Admitting: Internal Medicine

## 2018-07-16 VITALS — BP 120/70 | HR 94 | Resp 16 | Ht 66.0 in | Wt 130.0 lb

## 2018-07-16 DIAGNOSIS — J441 Chronic obstructive pulmonary disease with (acute) exacerbation: Secondary | ICD-10-CM | POA: Diagnosis not present

## 2018-07-16 MED ORDER — LEVOFLOXACIN 500 MG PO TABS: 500.0000 mg | ORAL_TABLET | Freq: Every day | ORAL | 0 refills | Status: AC

## 2018-07-16 MED ORDER — PREDNISONE 20 MG PO TABS: 40.0000 mg | ORAL_TABLET | Freq: Every day | ORAL | 0 refills | Status: DC

## 2018-07-16 NOTE — Progress Notes (Signed)
   Name: Caroline Conley MRN: [id number] DOB: [birth year]    CONSULTATION DATE:  07/16/2018   REFERRING MD : [name]  CC: COPD evaluation  Previous PFT 2017 ratio 18%, Fev1 2.2L 74% fef25/75 0.78L 22%  HISTORY OF PRESENT ILLNESS: 52 yo WF with dx fo COPD Chronic SOB and DOE + wheezing CT of the chest reviewed with patient and family at last OV  there is no lung masses noted however there is areas of emphysema in the upper lobe region bilaterally  Stopped smoking several months ago but still with second hand smoke exposure Uses advair Uses albuteol nebs and inhaler as needed  +wheezing Ongoing symtpoms +productive cough  I have Independently reviewed images of  CXR 6.7.19 Interpretation:resolving LLL infiltrate  She has chronic progressive resp disease She is asking for disability papers  Patient does not read/write 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 2,4-D Dimethylamine (Amisol) Itching    (Pt unsure of this allergy)  . Morphine And Related Itching  . Vicodin [Hydrocodone-Acetaminophen] Nausea And Vomiting    GI distress   REVIEW OF SYSTEMS:   Constitutional: Negative for fever, chills, weight loss, +malaise/fatigue  HENT: Negative for hearing loss, ear pain, nosebleeds, congestion, sore throat, neck pain, tinnitus and ear discharge.   Eyes: Negative for blurred vision, double vision, photophobia, pain, discharge and redness.  Respiratory:+cough, -hemoptysis, -sputum production, +shortness of breath, +wheezing and -stridor.   Cardiovascular: Negative for chest pain, palpitations, orthopnea, claudication, leg swelling and PND.  Gastrointestinal: Negative for heartburn, nausea, vomiting, abdominal pain, diarrhea, constipation, blood in stool and melena.  Endo/Heme/Allergies: Negative for environmental allergies and polydipsia. Does not bruise/bleed easily. See HPI     VITAL SIGNS: BP 120/70 (BP Location: Left Arm, Cuff Size:  Normal)   Pulse 94   Resp 16   Ht 5\' 6"  (1.676 m)   Wt 130 lb (59 kg)   SpO2 97%   BMI 20.98 kg/m  132/82 HR 116 95%o2sat  Physical Examination:  GENERAL:NAD, no fevers, chills, no weakness no fatigue HEAD: Normocephalic, atraumatic.  EYES: Pupils equal, round, reactive to light. Extraocular muscles intact. No scleral icterus.  MOUTH: Moist mucosal membrane. Dentition intact. No abscess noted.  EAR, NOSE, THROAT: Clear without exudates. No external lesions.  NECK: Supple. No thyromegaly. No nodules. No JVD.  PULMONARY:  +wheezes CARDIOVASCULAR: S1 and S2. Regular rate and rhythm. No murmurs, rubs, or gallops. No edema. Pedal pulses 2+ bilaterally.  ALL OTHER ROS ARE NEGATIVE    ASSESSMENT / PLAN: 52 yo white female with mild/moderate COPD stage D with COPD-with acute mild COPD exacerabation She will need another round of ABX and steroids  1.COPD-moderate Ongoing wheezing and persistent cough with COPD exacerbation Continue Symbicort inhaler New rx: Spiriva Respimat inhaler -2 inhalations daily Continue albuterol as needed for increased shortness of breath, wheezing, chest tightness, cough Add pulmicort nebs BID  tussionex for couigh  2.CT chest +for emphysema   3.Deconditioned state Exercise as tolerated  4.COPD exacerbation Prednisone and Levquin given today   All questions answered Follow-up in 3 months   Reneka Nebergall [name], M.D.  [name]  Medical Director [place] Director [facility] Cardio-Pulmonary Department

## 2018-07-16 NOTE — Patient Instructions (Signed)
Start prednisone 40 mg daily for 10 days Start Levaquin use as prescribed  Continue inhalers as prescribed

## 2018-07-17 ENCOUNTER — Other Ambulatory Visit: Payer: Self-pay | Admitting: Internal Medicine

## 2018-07-17 ENCOUNTER — Other Ambulatory Visit: Payer: Self-pay | Admitting: Family Medicine

## 2018-07-17 DIAGNOSIS — B370 Candidal stomatitis: Secondary | ICD-10-CM

## 2018-07-28 NOTE — Progress Notes (Signed)
New Outpatient Visit Date: 07/29/2018  Primary Care Provider: Olin Hauser, DO [address] [place], [place] [address]  Chief Complaint: Fast heart rate  HPI:  Caroline Conley is a 52 y.o. female who is being seen today as a self-referral for evaluation of tachycardia. She has a history of COPD.  She was hospitalized at [facility] in June for worsening shortness of breath.  She reports a diagnosis of pneumonia, though discharge summary mentions only COPD exacerbation.  While in the hospital, [name] was told that her heart rate was frequently elevated, though again I see no mention of this in the discharge summary.  Since leaving the hospital, she has continued to have waxing and waning shortness of breath with mild activity and was recently placed back on a prednisone burst and course of antibiotics by her pulmonologist, Dr. Mortimer Fries.  [name] notes frequent rapid heartbeats with mild activity.  She has not had any palpitations at rest.  She frequently feels short of breath and "achy all over."  She also notes occasional right-sided chest discomfort radiating up to her neck.  It is sharp and lasts several hours at a time.  This is not related to any particular activity and has been present for many years (including prior to catheterization by Dr. Clayborn Bigness in 2015).  She has chronic 4 pillow orthopnea; she sometimes even sleeps in a recliner.  She also notes intermittent dependent leg edema.  She happily reports that she stopped smoking 4-1/2 months ago.  --------------------------------------------------------------------------------------------------  Cardiovascular History & Procedures: Cardiovascular Problems:  Tachycardia  Atypical chest pain  Risk Factors:  Tobacco use  Cath/PCI:  LHC (2015): Report not available.  Images personally reviewed, demonstrating no significant CAD and normal LVEF.  CV Surgery:  None  EP Procedures and Devices:  None  Non-Invasive  Evaluation(s):  None  Recent CV Pertinent Labs: Lab Results  Component Value Date   CHOL 169 07/29/2017   HDL 35 (L) 07/29/2017   LDLCALC 85 07/29/2017   TRIG 243 (H) 07/29/2017   CHOLHDL 4.8 07/29/2017   INR 1.0 05/21/2007   K 3.8 05/23/2018   K 3.6 09/23/2014   BUN 11 05/23/2018   BUN 5 (L) 09/23/2014   CREATININE 1.00 05/23/2018   CREATININE 1.04 07/29/2017    --------------------------------------------------------------------------------------------------  Past Medical History:  Diagnosis Date  . Cancer ([place]) 01/2016   basal cell   . COPD (chronic obstructive pulmonary disease) ([place])   . Emphysema of lung ([place])   . Wears dentures    full upper and lower    Past Surgical History:  Procedure Laterality Date  . ABDOMINAL HYSTERECTOMY    . APPENDECTOMY    . BACK SURGERY     this will make third time  . CARDIAC CATHETERIZATION  09/20/14   [facility] - Dr. Clayborn Bigness  . COLONOSCOPY WITH PROPOFOL N/A 07/08/2016   Procedure: COLONOSCOPY WITH PROPOFOL;  Surgeon: Lucilla Lame, MD;  Location: [facility];  Service: Endoscopy;  Laterality: N/A;  . DEBRIDEMENT TENNIS ELBOW    . METATARSAL OSTEOTOMY  05/18/2013  . ROTATOR CUFF REPAIR    . TONSILLECTOMY      No outpatient medications have been marked as taking for the 07/29/18 encounter (Office Visit) with Sahas Sluka, Harrell Gave, MD.    Allergies: 2,4-d dimethylamine (amisol); Morphine and related; and Vicodin [hydrocodone-acetaminophen]  Social History   Tobacco Use  . Smoking status: Former Smoker    Packs/day: 0.25    Years: 30.00    Pack  years: 7.50    Types: Cigarettes    Last attempt to quit: 03/02/2018    Years since quitting: 0.4  . Smokeless tobacco: Never Used  . Tobacco comment: 0.5 to 1 ppd >30 years  Substance Use Topics  . Alcohol use: No  . Drug use: No    Family History  Problem Relation Age of Onset  . Diabetes Mother   . High blood pressure Mother   . Gout Mother   . Cancer Father   . Stroke  Father   . Arthritis/Rheumatoid Father   . Diabetes Father   . High blood pressure Father   . Heart failure Father   . Heart failure Brother   . Diabetes Brother     Review of Systems: A 12-system review of systems was performed and was negative except as noted in the HPI.  --------------------------------------------------------------------------------------------------  Physical Exam: BP (!) 162/88 (BP Location: Right Arm, Patient Position: Sitting, Cuff Size: Normal)   Ht 5\' 6"  (1.676 m)   Wt 131 lb (59.4 kg)   BMI 21.14 kg/m   General: NAD. HEENT: No conjunctival pallor or scleral icterus. Moist mucous membranes. OP clear. Neck: Supple without lymphadenopathy, thyromegaly, JVD, or HJR. No carotid bruit. Lungs: Normal work of breathing.  Mildly diminished breath sounds throughout with diffuse Aliea Bobe expiratory wheezes.  No crackles. Heart: Regular rate and rhythm without murmurs, rubs, or gallops. Non-displaced PMI. Abd: Bowel sounds present. Soft, NT/ND without hepatosplenomegaly Ext: No lower extremity edema. Radial, PT, and DP pulses are 2+ bilaterally Skin: Warm and dry without rash. Neuro: CNIII-XII intact. Strength and fine-touch sensation intact in upper and lower extremities bilaterally. Psych: Normal mood and affect.  EKG: Normal sinus rhythm with left atrial enlargement.  Otherwise, no significant abnormalities.  Lab Results  Component Value Date   WBC 11.5 (H) 05/23/2018   HGB 13.2 05/23/2018   HCT 37.3 05/23/2018   MCV 92.4 05/23/2018   PLT 285 05/23/2018    Lab Results  Component Value Date   NA 137 05/23/2018   K 3.8 05/23/2018   CL 105 05/23/2018   CO2 23 05/23/2018   BUN 11 05/23/2018   CREATININE 1.00 05/23/2018   GLUCOSE 124 (H) 05/23/2018   ALT 14 05/22/2018    Lab Results  Component Value Date   CHOL 169 07/29/2017   HDL 35 (L) 07/29/2017   LDLCALC 85 07/29/2017   TRIG 243 (H) 07/29/2017   CHOLHDL 4.8 07/29/2017      --------------------------------------------------------------------------------------------------  ASSESSMENT AND PLAN: Tachycardia my palpitations, and shortness of breath Patient has a normal heart rate today.  She reports elevated heart rates during recent hospitalization for pneumonia/COPD, though I see no mention of this in the discharge summaries.  She has frequent palpitations with activity but none at rest.  I suspect that deconditioning and underlying lung disease bring on sinus tachycardia.  She certainly is at risk for structural heart disease, including pulmonary hypertension and right heart failure in the setting of her advanced lung disease.  I recommended a transthoracic echocardiogram for further assessment.  We also place a 48-hour Holter monitor to further evaluate her palpitations and reported tachycardia.  If the studies are unremarkable, I do not recommend any further cardiac testing.  Atypical chest pain Long-standing and unlikely related to coronary artery disease, given clean catheterization in 2015.  Continue with primary prevention.  No ischemia work-up recommended at this time.  Follow-up: Return to clinic in 1 month to reassess symptoms and review results of  echo and Holter monitor.  [name], MD 07/30/2018 6:41 AM

## 2018-07-29 ENCOUNTER — Ambulatory Visit (INDEPENDENT_AMBULATORY_CARE_PROVIDER_SITE_OTHER): Payer: Medicaid Other | Admitting: Internal Medicine

## 2018-07-29 ENCOUNTER — Encounter: Payer: Self-pay | Admitting: Internal Medicine

## 2018-07-29 ENCOUNTER — Telehealth: Payer: Self-pay | Admitting: Internal Medicine

## 2018-07-29 VITALS — BP 162/88 | Ht 66.0 in | Wt 131.0 lb

## 2018-07-29 DIAGNOSIS — R0602 Shortness of breath: Secondary | ICD-10-CM

## 2018-07-29 DIAGNOSIS — R002 Palpitations: Secondary | ICD-10-CM

## 2018-07-29 DIAGNOSIS — R000 Tachycardia, unspecified: Secondary | ICD-10-CM

## 2018-07-29 NOTE — Patient Instructions (Signed)
Medication Instructions:  Your physician recommends that you continue on your current medications as directed. Please refer to the Current Medication list given to you today.   Labwork: none  Testing/Procedures: Your physician has requested that you have an echocardiogram. Echocardiography is a painless test that uses sound waves to create images of your heart. It provides your doctor with information about the size and shape of your heart and how well your heart's chambers and valves are working. This procedure takes approximately one hour. There are no restrictions for this procedure. You may get an IV, if needed, to receive an ultrasound enhancing agent through to better visualize your heart.   Your physician has recommended that you wear a 48 HOUR holter monitor. Holter monitors are medical devices that record the heart's electrical activity. Doctors most often use these monitors to diagnose arrhythmias. Arrhythmias are problems with the speed or rhythm of the heartbeat. The monitor is a small, portable device. You can wear one while you do your normal daily activities. This is usually used to diagnose what is causing palpitations/syncope (passing out).    Follow-Up: Your physician recommends that you schedule a follow-up appointment in: 1 MONTH WITH APP.      Holter Monitoring A Holter monitor is a small device that is used to detect abnormal heart rhythms. It clips to your clothing and is connected by wires to flat, sticky disks (electrodes) that attach to your chest. It is worn continuously for 24-48 hours. Follow these instructions at home:  Wear your Holter monitor at all times, even while exercising and sleeping, for as long as directed by your health care provider.  Make sure that the Holter monitor is safely clipped to your clothing or close to your body as recommended by your health care provider.  Do not get the monitor or wires wet.  Do not put body lotion or  moisturizer on your chest.  Keep your skin clean.  Keep a diary of your daily activities, such as walking and doing chores. If you feel that your heartbeat is abnormal or that your heart is fluttering or skipping a beat: ? Record what you are doing when it happens. ? Record what time of day the symptoms occur.  Return your Holter monitor as directed by your health care provider.  Keep all follow-up visits as directed by your health care provider. This is important. Get help right away if:  You feel lightheaded or you faint.  You have trouble breathing.  You feel pain in your chest, upper arm, or jaw.  You feel sick to your stomach and your skin is pale, cool, or damp.  You heartbeat feels unusual or abnormal. This information is not intended to replace advice given to you by your health care provider. Make sure you discuss any questions you have with your health care provider. Document Released: 08/30/2004 Document Revised: 05/09/2016 Document Reviewed: 07/11/2014 Elsevier Interactive Patient Education  2018 [employer].    Echocardiogram An echocardiogram, or echocardiography, uses sound waves (ultrasound) to produce an image of your heart. The echocardiogram is simple, painless, obtained within a short period of time, and offers valuable information to your health care provider. The images from an echocardiogram can provide information such as:  Evidence of coronary artery disease (CAD).  Heart size.  Heart muscle function.  Heart valve function.  Aneurysm detection.  Evidence of a past heart attack.  Fluid buildup around the heart.  Heart muscle thickening.  Assess heart valve function.  Tell a health care provider about:  Any allergies you have.  All medicines you are taking, including vitamins, herbs, eye drops, creams, and over-the-counter medicines.  Any problems you or family members have had with anesthetic medicines.  Any blood disorders you  have.  Any surgeries you have had.  Any medical conditions you have.  Whether you are pregnant or may be pregnant. What happens before the procedure? No special preparation is needed. Eat and drink normally. What happens during the procedure?  In order to produce an image of your heart, gel will be applied to your chest and a wand-like tool (transducer) will be moved over your chest. The gel will help transmit the sound waves from the transducer. The sound waves will harmlessly bounce off your heart to allow the heart images to be captured in real-time motion. These images will then be recorded.  You may need an IV to receive a medicine that improves the quality of the pictures. What happens after the procedure? You may return to your normal schedule including diet, activities, and medicines, unless your health care provider tells you otherwise. This information is not intended to replace advice given to you by your health care provider. Make sure you discuss any questions you have with your health care provider. Document Released: 11/29/2000 Document Revised: 07/20/2016 Document Reviewed: 08/09/2013 Elsevier Interactive Patient Education  2017 [employer].

## 2018-07-29 NOTE — Telephone Encounter (Signed)
Recieved request from : Patient with Disability forms    Forwarded to [employer] for processing with ROI

## 2018-07-30 ENCOUNTER — Encounter: Payer: Self-pay | Admitting: Internal Medicine

## 2018-07-30 DIAGNOSIS — R002 Palpitations: Secondary | ICD-10-CM | POA: Insufficient documentation

## 2018-07-30 DIAGNOSIS — R000 Tachycardia, unspecified: Secondary | ICD-10-CM | POA: Insufficient documentation

## 2018-07-30 DIAGNOSIS — R0602 Shortness of breath: Secondary | ICD-10-CM | POA: Insufficient documentation

## 2018-08-04 ENCOUNTER — Other Ambulatory Visit: Payer: Self-pay

## 2018-08-04 ENCOUNTER — Ambulatory Visit (INDEPENDENT_AMBULATORY_CARE_PROVIDER_SITE_OTHER): Payer: Medicaid Other

## 2018-08-04 DIAGNOSIS — R002 Palpitations: Secondary | ICD-10-CM | POA: Diagnosis not present

## 2018-08-04 DIAGNOSIS — R0602 Shortness of breath: Secondary | ICD-10-CM | POA: Diagnosis not present

## 2018-08-06 ENCOUNTER — Ambulatory Visit (INDEPENDENT_AMBULATORY_CARE_PROVIDER_SITE_OTHER): Payer: Medicaid Other

## 2018-08-06 DIAGNOSIS — R002 Palpitations: Secondary | ICD-10-CM | POA: Diagnosis not present

## 2018-08-06 DIAGNOSIS — R0602 Shortness of breath: Secondary | ICD-10-CM | POA: Diagnosis not present

## 2018-08-13 ENCOUNTER — Encounter: Payer: Self-pay | Admitting: Family Medicine

## 2018-08-13 ENCOUNTER — Ambulatory Visit
Admission: RE | Admit: 2018-08-13 | Discharge: 2018-08-13 | Disposition: A | Discharge status: Home / Self Care | Payer: Medicaid Other | Source: Ambulatory Visit | Attending: Family Medicine | Admitting: Family Medicine

## 2018-08-13 ENCOUNTER — Ambulatory Visit (INDEPENDENT_AMBULATORY_CARE_PROVIDER_SITE_OTHER): Payer: Medicaid Other | Admitting: Family Medicine

## 2018-08-13 VITALS — BP 117/73 | HR 98 | Temp 98.4°F | Resp 16 | Ht 66.0 in | Wt 131.0 lb

## 2018-08-13 DIAGNOSIS — R05 Cough: Secondary | ICD-10-CM | POA: Diagnosis not present

## 2018-08-13 DIAGNOSIS — J441 Chronic obstructive pulmonary disease with (acute) exacerbation: Secondary | ICD-10-CM

## 2018-08-13 MED ORDER — DOXYCYCLINE HYCLATE 100 MG PO TABS: 100.0000 mg | ORAL_TABLET | Freq: Two times a day (BID) | ORAL | 0 refills | Status: DC

## 2018-08-13 MED ORDER — PREDNISONE 50 MG PO TABS: 50.0000 mg | ORAL_TABLET | Freq: Every day | ORAL | 0 refills | Status: DC

## 2018-08-13 NOTE — Progress Notes (Signed)
Subjective:    Patient ID: Caroline Conley, female    DOB: [birth year], 52 y.o.   MRN: [id number]  Caroline Conley is a 52 y.o. female presenting on 08/13/2018 for Nasal Congestion (cough onset 4 days) and COPD   HPI   COPD, Acute Exacerbation / Sinus Congestion Reports recent course with COPD was hospitalized in 05/2018 for AECOPD and PNA, treated in office by [facility] Dr Mortimer Fries multiple times in outpatient for COPD as well, last course in 07/16/18 given Levaquin 500mg  daily for 10 days antibiotic and prednisone course for up to 10 days. She states interval improvement only up to 85% better but did not completely resolve. She did have prior X-ray after PNA showing improvement - Today she reports recent symptoms now worse for past 4 days with sinus congestion and thicker productive cough. Describes "lungs and chest on fire" with problem after persistent coughing spell - Using her breathing treatments at home currently some relief - Admits dyspnea at times with coughing - Admits mild low grade temp by report - Denies any chills sweats, nausea vomiting, chest pain  HM UTD PNA vaccine Pneumovax-23 < age 35 Due for Flu vaccine, will return when in stock   Depression screen [facility] 2/9 08/13/2018 08/06/2017 06/07/2016  Decreased Interest 0 0 0  Down, Depressed, Hopeless 0 0 0  PHQ - 2 Score 0 0 0    Social History   Tobacco Use  . Smoking status: Former Smoker    Packs/day: 0.25    Years: 30.00    Pack years: 7.50    Types: Cigarettes    Last attempt to quit: 03/02/2018    Years since quitting: 0.4  . Smokeless tobacco: Former Systems developer  . Tobacco comment: 0.5 to 1 ppd >30 years  Substance Use Topics  . Alcohol use: No  . Drug use: No    Review of Systems Per HPI unless specifically indicated above     Objective:    BP 117/73   Pulse 98   Temp 98.4 F (36.9 C) (Oral)   Resp 16   Ht 5\' 6"  (1.676 m)   Wt 131 lb (59.4 kg)   SpO2 97%   BMI 21.14 kg/m   Wt Readings from Last 3  Encounters:  08/13/18 131 lb (59.4 kg)  07/29/18 131 lb (59.4 kg)  07/16/18 130 lb (59 kg)    Physical Exam  Constitutional: She is oriented to person, place, and time. She appears well-developed and well-nourished. No distress.  Mostly well appearing, comfortable, cooperative  HENT:  Head: Normocephalic and atraumatic.  Mouth/Throat: Oropharynx is clear and moist.  Eyes: Conjunctivae are normal. Right eye exhibits no discharge. Left eye exhibits no discharge.  Neck: Normal range of motion. Neck supple. No thyromegaly present.  Cardiovascular: Normal rate, regular rhythm, normal heart sounds and intact distal pulses.  No murmur heard. Pulmonary/Chest: No respiratory distress. She has wheezes (diffuse worse upper bilateral airway wheezing with coarse sounds, bilateral lower mild scattered wheezes). She has no rales.  Abnormal breath sounds today with wheezing as noted. Reduced air movement diffusely only mild reduced upper airway and worse lower air movement with reduced movement. No focal crackles.  Occasional cough.  Musculoskeletal: Normal range of motion. She exhibits no edema.  Lymphadenopathy:    She has no cervical adenopathy.  Neurological: She is alert and oriented to person, place, and time.  Skin: Skin is warm and dry. No rash noted. She is not diaphoretic. No erythema.  Psychiatric:  She has a normal mood and affect. Her behavior is normal.  Well groomed, good eye contact, normal speech and thoughts  Nursing note and vitals reviewed.    I have personally reviewed the radiology report from Chest X-ray STAT on 08/13/18.  CLINICAL DATA:  History of recent LEFT lower lobe pneumonia. Worsening productive cough. Suspected COPD exacerbation. Now wheezing in upper airways, reduced air movement lower.  EXAM: CHEST - 2 VIEW  COMPARISON:  Chest x-rays dated 05/22/2018 and 05/18/2018.  FINDINGS: Lungs are now clear. No pleural effusion or pneumothorax seen. Chronic  bronchitic changes noted centrally. Heart size and mediastinal contours are within normal limits. Osseous structures about the chest are unremarkable.  IMPRESSION: 1. No active cardiopulmonary disease on today's exam. No evidence of pneumonia or pulmonary edema. 2. Chronic bronchitic changes.   Electronically Signed   By: Franki Cabot M.D.   On: 08/13/2018 12:51  Results for orders placed or performed during the hospital encounter of 05/22/18  MRSA PCR Screening  Result Value Ref Range   MRSA by PCR NEGATIVE NEGATIVE  Troponin I  Result Value Ref Range   Troponin I <0.03 <0.03 ng/mL  CBC with Differential  Result Value Ref Range   WBC 13.1 (H) 3.6 - 11.0 K/uL   RBC 4.21 3.80 - 5.20 MIL/uL   Hemoglobin 13.6 12.0 - 16.0 g/dL   HCT 39.1 35.0 - 47.0 %   MCV 92.8 80.0 - 100.0 fL   MCH 32.3 26.0 - 34.0 pg   MCHC 34.8 32.0 - 36.0 g/dL   RDW 13.1 11.5 - 14.5 %   Platelets 346 150 - 440 K/uL   Neutrophils Relative % 86 %   Neutro Abs 11.3 (H) 1.4 - 6.5 K/uL   Lymphocytes Relative 10 %   Lymphs Abs 1.3 1.0 - 3.6 K/uL   Monocytes Relative 4 %   Monocytes Absolute 0.5 0.2 - 0.9 K/uL   Eosinophils Relative 0 %   Eosinophils Absolute 0.0 0 - 0.7 K/uL   Basophils Relative 0 %   Basophils Absolute 0.0 0 - 0.1 K/uL  Comprehensive metabolic panel  Result Value Ref Range   Sodium 138 135 - 145 mmol/L   Potassium 3.8 3.5 - 5.1 mmol/L   Chloride 101 101 - 111 mmol/L   CO2 25 22 - 32 mmol/L   Glucose, Bld 121 (H) 65 - 99 mg/dL   BUN 10 6 - 20 mg/dL   Creatinine, Ser 1.16 (H) 0.44 - 1.00 mg/dL   Calcium 9.7 8.9 - 10.3 mg/dL   Total Protein 7.3 6.5 - 8.1 g/dL   Albumin 3.7 3.5 - 5.0 g/dL   AST 23 15 - 41 U/L   ALT 14 14 - 54 U/L   Alkaline Phosphatase 82 38 - 126 U/L   Total Bilirubin 0.4 0.3 - 1.2 mg/dL   GFR calc non Af Amer 53 (L) >60 mL/min   GFR calc Af Amer >60 >60 mL/min   Anion gap 12 5 - 15  HIV antibody (Routine Testing)  Result Value Ref Range   HIV Screen 4th  Generation wRfx Non Reactive Non Reactive  Basic metabolic panel  Result Value Ref Range   Sodium 137 135 - 145 mmol/L   Potassium 3.8 3.5 - 5.1 mmol/L   Chloride 105 101 - 111 mmol/L   CO2 23 22 - 32 mmol/L   Glucose, Bld 124 (H) 65 - 99 mg/dL   BUN 11 6 - 20 mg/dL   Creatinine, Ser 1.00  0.44 - 1.00 mg/dL   Calcium 9.0 8.9 - 10.3 mg/dL   GFR calc non Af Amer >60 >60 mL/min   GFR calc Af Amer >60 >60 mL/min   Anion gap 9 5 - 15  CBC  Result Value Ref Range   WBC 11.5 (H) 3.6 - 11.0 K/uL   RBC 4.03 3.80 - 5.20 MIL/uL   Hemoglobin 13.2 12.0 - 16.0 g/dL   HCT 37.3 35.0 - 47.0 %   MCV 92.4 80.0 - 100.0 fL   MCH 32.7 26.0 - 34.0 pg   MCHC 35.4 32.0 - 36.0 g/dL   RDW 13.3 11.5 - 14.5 %   Platelets 285 150 - 440 K/uL  Procalcitonin - Baseline  Result Value Ref Range   Procalcitonin <0.10 ng/mL      Assessment & Plan:   Problem List Items Addressed This Visit    COPD with acute exacerbation (HCC) - Primary   Relevant Medications   doxycycline (VIBRA-TABS) 100 MG tablet   predniSONE (DELTASONE) 50 MG tablet   Other Relevant Orders   DG Chest 2 View (Completed)      Consistent with mild early acute exacerbation of COPD with worsening productive cough. Similar to prior exacerbations, last 07/16/18. - No hypoxia (97% on RA), afebrile but reported subjective low grade temp, last hospitalization >2 months ago in 05/2018  Plan: Check STAT CXR today - agreed to empiric treatment w/ antibiotic today regardless, given high risk patient and long weekend clinic closed Monday, I will be out Tuesday - After visit sent results to patient, negative for CAP at this time.  1. Start Doxycycline 100mg  BID x 10 days, precautions given - agreed to switch to alternative antibiotic since recently treated with levaquin 2. Start Prednisone 50mg  x 5 day steroid burst 3. Use home nebulizers as prescribed for PRNs and maintenance 4. RTC about 1 week if not improving, otherwise strict return criteria to go  to ED  May return to Pulmonology in near future if not improving.   Meds ordered this encounter  Medications  . doxycycline (VIBRA-TABS) 100 MG tablet    Sig: Take 1 tablet (100 mg total) by mouth 2 (two) times daily. For 10 days. Take with full glass of water, stay upright 30 min after taking.    Dispense:  20 tablet    Refill:  0  . predniSONE (DELTASONE) 50 MG tablet    Sig: Take 1 tablet (50 mg total) by mouth daily with breakfast.    Dispense:  5 tablet    Refill:  0    Follow up plan: Return in about 1 week (around 08/20/2018), or if symptoms worsen or fail to improve, for COPD.  [name], [facility] 08/13/2018, 1:22 PM

## 2018-08-13 NOTE — Patient Instructions (Addendum)
Thank you for coming to the office today.  Ask Pulmonology (Dr Mortimer Fries) about Mucomyst nebulizer (generic name is acetylcysteine) - this can break up secretions is used short term to reduce thick secretions.  See if they can prescribe it for you and what instructions are - as I am not routinely prescribing this and am aware it is used in hospital  Check X-ray today stay tuned for results.  Start taking Doxycycline antibiotic 100mg  twice daily for 10 days. Take with full glass of water and stay upright for at least 30 min after taking, may be seated or standing, but should NOT lay down. This is just a safety precaution, if this medicine does not go all the way down throat well it could cause some burning discomfort to throat and esophagus.  Prednisone course daily for 5 days.  If worsening breathing, fevers chills short of breath medicine not working contact office or pulmonology or go to hospital  Please schedule a Follow-up Appointment to: Return in about 1 week (around 08/20/2018), or if symptoms worsen or fail to improve, for COPD.  If you have any other questions or concerns, please feel free to call the office or send a message through [place]. You may also schedule an earlier appointment if necessary.  Additionally, you may be receiving a survey about your experience at our office within a few days to 1 week by e-mail or mail. We value your feedback.  [name], DO [place]

## 2018-08-18 ENCOUNTER — Ambulatory Visit (INDEPENDENT_AMBULATORY_CARE_PROVIDER_SITE_OTHER): Payer: Medicaid Other | Admitting: Orthopaedic Surgery

## 2018-08-18 ENCOUNTER — Encounter (INDEPENDENT_AMBULATORY_CARE_PROVIDER_SITE_OTHER): Payer: Self-pay | Admitting: Orthopaedic Surgery

## 2018-08-18 ENCOUNTER — Ambulatory Visit (INDEPENDENT_AMBULATORY_CARE_PROVIDER_SITE_OTHER): Payer: Self-pay

## 2018-08-18 ENCOUNTER — Ambulatory Visit (INDEPENDENT_AMBULATORY_CARE_PROVIDER_SITE_OTHER): Payer: Medicaid Other

## 2018-08-18 VITALS — BP 136/91 | HR 96 | Ht 66.0 in | Wt 130.0 lb

## 2018-08-18 DIAGNOSIS — M542 Cervicalgia: Secondary | ICD-10-CM

## 2018-08-18 DIAGNOSIS — M545 Low back pain: Secondary | ICD-10-CM | POA: Diagnosis not present

## 2018-08-18 NOTE — Progress Notes (Signed)
Office Visit Note   Patient: Caroline Conley           Date of Birth: August 16, 1966           MRN: [id number] Visit Date: 08/18/2018              Requested by: Olin Hauser, DO [address] [place], [place] [address] PCP: Olin Hauser, DO   Assessment & Plan: Visit Diagnoses:  1. Neck pain   2. Acute right-sided low back pain, with sciatica presence unspecified     Plan: We will schedule patient for some physical therapy for treatment of her thoracic spine discomfort.  Reflexes are intact no nerve root tension signs currently.  I will recheck her back again in 6 weeks.  No change in her medications.  Follow-Up Instructions: No follow-ups on file.   Orders:  Orders Placed This Encounter  Procedures  . XR Cervical Spine 2 or 3 views  . XR Lumbar Spine 2-3 Views   No orders of the defined types were placed in this encounter.     Procedures: No procedures performed   Clinical Data: No additional findings.   Subjective: Chief Complaint  Patient presents with  . Neck - Pain  . Lower Back - Pain    HPI 52 year old female seen with pain between her shoulder blades present for 2 months.  Some of the pain radiates in her left arm stops at the elbow states occasionally the arm feels weak she is right-hand dominant.  She is also had some pressure over the lumbar spine on the right side adjacent to the sacroiliac joint no numbness or tingling no weakness lower extremities.  Review of Systems positive for chronic pain management on Percocet 10/325  # 120 per month.  Previous L5-S1 disc herniation with instrumented fusion later revision for a broken screw.  Positive for COPD she quit smoking 6 months ago.  Positive hyperlipidemia history malignant melanoma tachycardia, palpitations.  Negative CT angio chest.  Otherwise negative as pertains HPI.  14 system update.   Objective: Vital Signs: BP (!) 136/91   Pulse 96   Ht 5\' 6"  (1.676 m)   Wt 130 lb (59 kg)   BMI  20.98 kg/m   Physical Exam  Constitutional: She is oriented to person, place, and time. She appears well-developed.  HENT:  Head: Normocephalic.  Right Ear: External ear normal.  Left Ear: External ear normal.  Eyes: Pupils are equal, round, and reactive to light.  Neck: No tracheal deviation present. No thyromegaly present.  Cardiovascular: Normal rate.  Pulmonary/Chest: Effort normal.  Abdominal: Soft.  Neurological: She is alert and oriented to person, place, and time.  Skin: Skin is warm and dry.  Psychiatric: She has a normal mood and affect. Her behavior is normal.    Ortho Exam patient has a good cervical range of motion minimal discomfort some tenderness between her shoulder blades.  No winging of the scapula.  Upper extremity reflexes are 2+ and symmetrical.  Well-healed lumbar incision.  No sciatic notch tenderness no trochanteric bursal tenderness mild tenderness over the right sacroiliac joint.  Anterior tib gastrocsoleus is strong.  Specialty Comments:  No specialty comments available.  Imaging: Xr Cervical Spine 2 Or 3 Views  Result Date: 08/18/2018 AP lateral cervical spine x-rays are obtained and reviewed.  This shows normal cervical lordosis.  Well-maintained disc space height no anterolisthesis.  Minimal uncovertebral changes at C5-6. Impression: Normal cervical spine x-rays other than mild C5-6  early changes.  Xr Lumbar Spine 2-3 Views  Result Date: 08/18/2018 AP lateral lumbar spine x-rays obtained and reviewed.  This shows old gallbladder clips.  Instrumented fusion L5-S1 with no loosening and satisfactory graft incorporation.  No acute changes. Impression: Previous L5-S1 fusion.  No adjacent level changes.    PMFS History: Patient Active Problem List   Diagnosis Date Noted  . Tachycardia 07/30/2018  . Palpitations 07/30/2018  . SOB (shortness of breath) 07/30/2018  . COPD exacerbation ([place]) 05/22/2018  . Full thickness rotator cuff tear 02/05/2018  .  Shoulder joint pain 02/05/2018  . Chronic pain of both shoulders 01/01/2018  . Chronic bursitis of right shoulder 01/01/2018  . Frozen shoulder 01/01/2018  . Personal history of malignant melanoma 07/28/2017  . Hyperlipidemia 07/28/2017  . Elevated serum creatinine 07/28/2017  . HAV (hallux abducto valgus) 08/02/2016  . Special screening for malignant neoplasms, colon   . COPD with acute exacerbation ([place]) 06/07/2016  . Angular cheilitis 06/07/2016  . Tobacco abuse 06/07/2016  . Melanoma of buttock ([place]) 08/30/2015  . Acid indigestion 09/07/2014  . Dyspepsia 09/07/2014   Past Medical History:  Diagnosis Date  . Cancer ([place]) 01/2016   basal cell   . COPD (chronic obstructive pulmonary disease) ([place])   . Emphysema of lung ([place])   . Wears dentures    full upper and lower    Family History  Problem Relation Age of Onset  . Diabetes Mother   . High blood pressure Mother   . Gout Mother   . Cancer Father   . Stroke Father   . Arthritis/Rheumatoid Father   . Diabetes Father   . High blood pressure Father   . Heart failure Father   . Heart failure Brother   . Diabetes Brother     Past Surgical History:  Procedure Laterality Date  . ABDOMINAL HYSTERECTOMY    . APPENDECTOMY    . BACK SURGERY     this will make third time  . CARDIAC CATHETERIZATION  09/20/14   [facility] - Dr. Clayborn Bigness  . COLONOSCOPY WITH PROPOFOL N/A 07/08/2016   Procedure: COLONOSCOPY WITH PROPOFOL;  Surgeon: Lucilla Lame, MD;  Location: [facility];  Service: Endoscopy;  Laterality: N/A;  . DEBRIDEMENT TENNIS ELBOW    . METATARSAL OSTEOTOMY  05/18/2013  . ROTATOR CUFF REPAIR    . TONSILLECTOMY     Social History   Occupational History  . Not on file  Tobacco Use  . Smoking status: Former Smoker    Packs/day: 0.25    Years: 30.00    Pack years: 7.50    Types: Cigarettes    Last attempt to quit: 03/02/2018    Years since quitting: 0.4  . Smokeless tobacco: Former Systems developer  . Tobacco comment: 0.5 to 1  ppd >30 years  Substance and Sexual Activity  . Alcohol use: No  . Drug use: No  . Sexual activity: Yes

## 2018-08-21 DIAGNOSIS — D2271 Melanocytic nevi of right lower limb, including hip: Secondary | ICD-10-CM | POA: Diagnosis not present

## 2018-08-21 DIAGNOSIS — Z85820 Personal history of malignant melanoma of skin: Secondary | ICD-10-CM | POA: Diagnosis not present

## 2018-08-21 DIAGNOSIS — Z872 Personal history of diseases of the skin and subcutaneous tissue: Secondary | ICD-10-CM | POA: Diagnosis not present

## 2018-08-21 DIAGNOSIS — M545 Low back pain: Secondary | ICD-10-CM | POA: Diagnosis not present

## 2018-08-21 DIAGNOSIS — Z79899 Other long term (current) drug therapy: Secondary | ICD-10-CM | POA: Diagnosis not present

## 2018-08-21 DIAGNOSIS — L821 Other seborrheic keratosis: Secondary | ICD-10-CM | POA: Diagnosis not present

## 2018-08-21 DIAGNOSIS — M25512 Pain in left shoulder: Secondary | ICD-10-CM | POA: Diagnosis not present

## 2018-08-21 DIAGNOSIS — D2262 Melanocytic nevi of left upper limb, including shoulder: Secondary | ICD-10-CM | POA: Diagnosis not present

## 2018-08-21 DIAGNOSIS — Z09 Encounter for follow-up examination after completed treatment for conditions other than malignant neoplasm: Secondary | ICD-10-CM | POA: Diagnosis not present

## 2018-08-21 DIAGNOSIS — M791 Myalgia, unspecified site: Secondary | ICD-10-CM | POA: Diagnosis not present

## 2018-08-21 DIAGNOSIS — Z08 Encounter for follow-up examination after completed treatment for malignant neoplasm: Secondary | ICD-10-CM | POA: Diagnosis not present

## 2018-08-21 DIAGNOSIS — D2261 Melanocytic nevi of right upper limb, including shoulder: Secondary | ICD-10-CM | POA: Diagnosis not present

## 2018-08-21 DIAGNOSIS — D2272 Melanocytic nevi of left lower limb, including hip: Secondary | ICD-10-CM | POA: Diagnosis not present

## 2018-08-31 ENCOUNTER — Ambulatory Visit
Admission: RE | Admit: 2018-08-31 | Discharge: 2018-08-31 | Disposition: A | Discharge status: Home / Self Care | Payer: Medicaid Other | Source: Ambulatory Visit | Attending: Internal Medicine | Admitting: Internal Medicine

## 2018-08-31 DIAGNOSIS — R002 Palpitations: Secondary | ICD-10-CM | POA: Diagnosis not present

## 2018-08-31 NOTE — Telephone Encounter (Addendum)
Patient in office to check status of forms.  Spoke with Patrici Ranks at [employer].  Packet was mailed to patient with instructions for payment and how to remit.  Patient says she did not receive this.    [employer] will re mail the instructions

## 2018-09-07 ENCOUNTER — Telehealth: Payer: Self-pay | Admitting: *Deleted

## 2018-09-07 MED ORDER — DILTIAZEM HCL ER 120 MG PO CP24: 120.0000 mg | ORAL_CAPSULE | Freq: Every day | ORAL | 2 refills | Status: DC

## 2018-09-07 NOTE — Telephone Encounter (Signed)
Results called to pt. Pt verbalized understanding of results and plan of care. SHe would like to try the diltiazem as she has some days it feels like her heart is going to beat out of her chest. Rx sent to verified pharmacy. Patient did not have f/u scheduled yet. Scheduled her to appointment on 10/21/18. She is aware of appointment date and time.

## 2018-09-07 NOTE — Telephone Encounter (Signed)
-----   Message from [name], MD sent at 09/06/2018  8:35 PM EDT ----- Please let the patient know that her monitor did not show any sustained arrhythmias, though her average heart rate was a bit high.  Brief atrial runs also noted.  If she is still symptomatic, I recommend that she try diltiazem XR 120 mg daily and f/u as previously discussed.

## 2018-09-21 ENCOUNTER — Encounter: Payer: Self-pay | Admitting: Family Medicine

## 2018-09-21 ENCOUNTER — Ambulatory Visit: Payer: Medicaid Other | Admitting: Family Medicine

## 2018-09-21 VITALS — BP 133/80 | HR 82 | Temp 98.3°F | Resp 16 | Ht 66.0 in | Wt 129.0 lb

## 2018-09-21 DIAGNOSIS — E538 Deficiency of other specified B group vitamins: Secondary | ICD-10-CM

## 2018-09-21 DIAGNOSIS — J441 Chronic obstructive pulmonary disease with (acute) exacerbation: Secondary | ICD-10-CM

## 2018-09-21 DIAGNOSIS — R238 Other skin changes: Secondary | ICD-10-CM

## 2018-09-21 DIAGNOSIS — R233 Spontaneous ecchymoses: Secondary | ICD-10-CM

## 2018-09-21 MED ORDER — PREDNISONE 20 MG PO TABS: ORAL_TABLET | ORAL | 0 refills | Status: DC

## 2018-09-21 NOTE — Progress Notes (Signed)
Subjective:    Patient ID: Caroline Conley, female    DOB: 07-Jul-1966, 52 y.o.   MRN: [id number]  Caroline Conley is a 52 y.o. female presenting on 09/21/2018 for chest congestion (cough, bodyache but not chills or fever onset 4 days)  Patient presents for a same day appointment.  HPI   ACUTE COPD / COUGHING - Last visit for similar AECOPD flare 08/13/18 - treated with Doxycycline, Prednisone with very good results. - Now interval worsening about 6 weeks later has concerns of another exacerbation. - Followed by Dr Jill Poling Pulmonology, has not been back recently. - Today she reports worse breathing and coughing over past 4 days, with worsening now. She states very recent weather change has triggered some symptoms but they were present before, also outdoors by campfire recently and in cold air seemed worse. - She still describes very thick sticky, white sputum, in past she has improved inpatient on Mucomyst treatment nebulized, she attempted to ask pulm about this - Admits associated burning and soreness within chest after coughing spells Denies myalgias, fever chills, sick contacts, hemoptysis  Additional complaint - Easy Bruising / Hematoma - reports upper arms mostly, R under bicep with notable new bruising from minor bumps and injury, she has one area still sore like a "knot" with bruising under R arm from fall. States new problem past few weeks only, previous not issue. Not taking NSAID or ASA or BC. She is requesting lab work. - Denies bleeding from gums or nose bleed or other bruising or swelling  Health Maintenance: Defer Flu vaccine, due to ill today  Depression screen [facility] 2/9 09/21/2018 08/13/2018 08/06/2017  Decreased Interest 0 0 0  Down, Depressed, Hopeless 0 0 0  PHQ - 2 Score 0 0 0    Social History   Tobacco Use  . Smoking status: Former Smoker    Packs/day: 0.25    Years: 30.00    Pack years: 7.50    Types: Cigarettes    Last attempt to quit: 03/02/2018    Years  since quitting: 0.5  . Smokeless tobacco: Former Systems developer  . Tobacco comment: 0.5 to 1 ppd >30 years  Substance Use Topics  . Alcohol use: No  . Drug use: No    Review of Systems Per HPI unless specifically indicated above     Objective:    BP 133/80   Pulse 82   Temp 98.3 F (36.8 C) (Oral)   Resp 16   Ht 5\' 6"  (1.676 m)   Wt 129 lb (58.5 kg)   SpO2 99%   BMI 20.82 kg/m   Wt Readings from Last 3 Encounters:  09/21/18 129 lb (58.5 kg)  08/18/18 130 lb (59 kg)  08/13/18 131 lb (59.4 kg)    Physical Exam  Constitutional: She is oriented to person, place, and time. She appears well-developed and well-nourished. No distress.  Mostly well appearing thin, comfortable, cooperative  HENT:  Head: Normocephalic and atraumatic.  Mouth/Throat: Oropharynx is clear and moist.  Eyes: Conjunctivae are normal. Right eye exhibits no discharge. Left eye exhibits no discharge.  Neck: Normal range of motion. Neck supple. No thyromegaly present.  Cardiovascular: Normal rate, regular rhythm, normal heart sounds and intact distal pulses.  No murmur heard. Pulmonary/Chest: No respiratory distress. She has wheezes (Somewhat improved but still persistent diffuse upper bilateral airway high pitched wheezing, now without coarse breath sounds or focal). She has no rales.  Abnormal breath sounds today with wheezing as noted. Reduced  air movement diffusely only mild reduced upper airway and worse lower air movement with reduced movement. No focal crackles.  Occasional cough.  Musculoskeletal: Normal range of motion. She exhibits no edema.  Lymphadenopathy:    She has no cervical adenopathy.  Neurological: She is alert and oriented to person, place, and time.  Skin: Skin is warm and dry. No rash noted. She is not diaphoretic. No erythema.  Ecchymosis scattered small areas upper arms bilateral, one larger area under R arm, posterior bicep area - with small 1 x 1 cm hematoma mild tender  Psychiatric: She  has a normal mood and affect. Her behavior is normal.  Well groomed, good eye contact, normal speech and thoughts  Nursing note and vitals reviewed.  Results for orders placed or performed during the hospital encounter of 05/22/18  MRSA PCR Screening  Result Value Ref Range   MRSA by PCR NEGATIVE NEGATIVE  Troponin I  Result Value Ref Range   Troponin I <0.03 <0.03 ng/mL  CBC with Differential  Result Value Ref Range   WBC 13.1 (H) 3.6 - 11.0 K/uL   RBC 4.21 3.80 - 5.20 MIL/uL   Hemoglobin 13.6 12.0 - 16.0 g/dL   HCT 39.1 35.0 - 47.0 %   MCV 92.8 80.0 - 100.0 fL   MCH 32.3 26.0 - 34.0 pg   MCHC 34.8 32.0 - 36.0 g/dL   RDW 13.1 11.5 - 14.5 %   Platelets 346 150 - 440 K/uL   Neutrophils Relative % 86 %   Neutro Abs 11.3 (H) 1.4 - 6.5 K/uL   Lymphocytes Relative 10 %   Lymphs Abs 1.3 1.0 - 3.6 K/uL   Monocytes Relative 4 %   Monocytes Absolute 0.5 0.2 - 0.9 K/uL   Eosinophils Relative 0 %   Eosinophils Absolute 0.0 0 - 0.7 K/uL   Basophils Relative 0 %   Basophils Absolute 0.0 0 - 0.1 K/uL  Comprehensive metabolic panel  Result Value Ref Range   Sodium 138 135 - 145 mmol/L   Potassium 3.8 3.5 - 5.1 mmol/L   Chloride 101 101 - 111 mmol/L   CO2 25 22 - 32 mmol/L   Glucose, Bld 121 (H) 65 - 99 mg/dL   BUN 10 6 - 20 mg/dL   Creatinine, Ser 1.16 (H) 0.44 - 1.00 mg/dL   Calcium 9.7 8.9 - 10.3 mg/dL   Total Protein 7.3 6.5 - 8.1 g/dL   Albumin 3.7 3.5 - 5.0 g/dL   AST 23 15 - 41 U/L   ALT 14 14 - 54 U/L   Alkaline Phosphatase 82 38 - 126 U/L   Total Bilirubin 0.4 0.3 - 1.2 mg/dL   GFR calc non Af Amer 53 (L) >60 mL/min   GFR calc Af Amer >60 >60 mL/min   Anion gap 12 5 - 15  HIV antibody (Routine Testing)  Result Value Ref Range   HIV Screen 4th Generation wRfx Non Reactive Non Reactive  Basic metabolic panel  Result Value Ref Range   Sodium 137 135 - 145 mmol/L   Potassium 3.8 3.5 - 5.1 mmol/L   Chloride 105 101 - 111 mmol/L   CO2 23 22 - 32 mmol/L   Glucose, Bld  124 (H) 65 - 99 mg/dL   BUN 11 6 - 20 mg/dL   Creatinine, Ser 1.00 0.44 - 1.00 mg/dL   Calcium 9.0 8.9 - 10.3 mg/dL   GFR calc non Af Amer >60 >60 mL/min   GFR calc Af Amer >  60 >60 mL/min   Anion gap 9 5 - 15  CBC  Result Value Ref Range   WBC 11.5 (H) 3.6 - 11.0 K/uL   RBC 4.03 3.80 - 5.20 MIL/uL   Hemoglobin 13.2 12.0 - 16.0 g/dL   HCT 37.3 35.0 - 47.0 %   MCV 92.4 80.0 - 100.0 fL   MCH 32.7 26.0 - 34.0 pg   MCHC 35.4 32.0 - 36.0 g/dL   RDW 13.3 11.5 - 14.5 %   Platelets 285 150 - 440 K/uL  Procalcitonin - Baseline  Result Value Ref Range   Procalcitonin <0.10 ng/mL      Assessment & Plan:   Problem List Items Addressed This Visit    COPD with acute exacerbation ([place]) - Primary  Consistent with early mild acute exacerbation of COPD with worsening productive cough / still very thick sputum. Similar to prior exacerbations, last 08/13/18 - resolved prednisone / doxy. - No hypoxia (99% on RA), afebrile, no recent hospitalization - Continues maintenance inhalers Followed by [facility] Dr Mortimer Fries  Plan: 1. Start Prednisone taper over 7 days - Continue current acute inhaler/nebulizeres - Considered antibiotics, however afebrile and no hypoxia, would not be indicated in mild AECOPD, unless recurrent or not improved on steroids - may contact office within 48 hours if need can add back Doxycycline RTC about 1 week if not improving, otherwise strict return criteria to go to ED  Also sent message to [facility] Dr Mortimer Fries to ask about possibility of Mucomyst / Acetylcysteine nebulizer for her secretion clearance as adjuvant therapy in future.     Relevant Medications   predniSONE (DELTASONE) 20 MG tablet    Other Visit Diagnoses    Easy bruising      Uncertain etiology why worsening now, likely combination thin skin and possibly related to prednisone use and smoking history. No other common culprit medications currently. Prior CBC unremarkable 05/2018. - Will check additional lab tests by  request - return tomorrow for labs      Meds ordered this encounter  Medications  . predniSONE (DELTASONE) 20 MG tablet    Sig: Take daily with food. Start with 60mg  (3 pills) x 2 days, then reduce to 40mg  (2 pills) x 2 days, then 20mg  (1 pill) x 3 days    Dispense:  13 tablet    Refill:  0    Follow up plan: Return if symptoms worsen or fail to improve, for COPD.  Future labs ordered for 09/22/18  [name], [facility] 09/21/2018, 5:44 PM

## 2018-09-21 NOTE — Patient Instructions (Addendum)
Thank you for coming to the office today.  Trial on Prednisone taper now for 7 days, follow instructions  No antibiotic for now, if not improve within 48 hours, call us back and we can add Doxy again for another round.  No X-ray today. Reassuring, likely just thicker mucus and wheezing.  Labs to determine if any cause of easy bruising.  I'm going to contact Dr Mortimer Fries - ask about the Mucomyst once more to see if he thinks this is a good option.  DUE for NON FASTING BLOOD WORK   SCHEDULE "Lab Only" visit in the morning at the clinic for lab draw in 1-2 days  - Make sure Lab Only appointment is at about 1 week before your next appointment, so that results will be available  For Lab Results, once available within 2-3 days of blood draw, you can can log in to MyChart online to view your results and a brief explanation. Also, we can discuss results at next follow-up visit.   Please schedule a Follow-up Appointment to: Return if symptoms worsen or fail to improve, for COPD.  If you have any other questions or concerns, please feel free to call the office or send a message through [place]. You may also schedule an earlier appointment if necessary.  Additionally, you may be receiving a survey about your experience at our office within a few days to 1 week by e-mail or mail. We value your feedback.  Caroline Putnam, DO [place]

## 2018-09-22 ENCOUNTER — Other Ambulatory Visit: Payer: Medicaid Other

## 2018-09-22 ENCOUNTER — Telehealth: Payer: Self-pay | Admitting: Family Medicine

## 2018-09-22 ENCOUNTER — Telehealth: Payer: Self-pay | Admitting: Internal Medicine

## 2018-09-22 NOTE — Telephone Encounter (Signed)
Please call regarding trial medication for nebulizer.

## 2018-09-22 NOTE — Telephone Encounter (Signed)
Patient notified as per Dr. Raliegh Ip she will call Dr. Zoila Shutter office for follow up plan.

## 2018-09-22 NOTE — Telephone Encounter (Signed)
Please notify patient that I spoke with her Pulmonologist Dr Mortimer Fries and that they agree to do a trial of the Mucomyst inhaler to help with her thick secretions. They will discuss this with her further and may request that she return to their office before prescribing, she may follow-up with them for more information.  Caroline Putnam, DO [facility] 09/22/2018, 1:13 PM   --------------------------------  Recent Epic staff message conversation with Dr Mortimer Fries at [facility] pulmonology as copied below:  Flora Lipps, MD  Olin Hauser, DO         I usually don't prescribe Mucomyst inhalers due to the fact that sometimes Because They May cause bronchoconstriction, However we can give a trial run to see if it helps her   Previous Messages    ----- Message -----  From: Olin Hauser, DO  Sent: 09/21/2018  5:33 PM EDT  To: Flora Lipps, MD  Subject: Possibility of Mucomyst              Dr Mortimer Fries,   I wanted to reach out to you with a question regarding a mutual patient. I just saw Caroline Conley today again 09/21/18 for AECOPD, this time only given prednisone taper instead of antibiotics based on her exam and symptoms.   She is describing a lot of thick white difficult to clear sputum, similar in past. I am not sure if she reached you with this question but figured I would ask as well.   What are your thoughts on Mucomyst / Acetylcysteine inhaled/nebulized for COPD patients outpatient therapy for clearing secretions? I am not familiar with this being used outside of the hospital. I believe this has helped her before inpatient.   Any advice or recommendations would be appreciated, if you think this is a good idea I would appreciate if you could rx it appropriately, otherwise, not a problem.   Thanks for your time.   Caroline Conley, [facility]  09/21/2018, 5:39 PM

## 2018-09-23 NOTE — Telephone Encounter (Signed)
Pt made aware refills are at [facility] with 11 refills. Nothing further needed.

## 2018-09-23 NOTE — Telephone Encounter (Signed)
LMCTB

## 2018-09-24 ENCOUNTER — Other Ambulatory Visit: Payer: Self-pay | Admitting: Family Medicine

## 2018-09-24 DIAGNOSIS — B370 Candidal stomatitis: Secondary | ICD-10-CM

## 2018-09-29 ENCOUNTER — Ambulatory Visit (INDEPENDENT_AMBULATORY_CARE_PROVIDER_SITE_OTHER): Payer: Medicaid Other | Admitting: Orthopaedic Surgery

## 2018-10-03 DIAGNOSIS — J440 Chronic obstructive pulmonary disease with acute lower respiratory infection: Secondary | ICD-10-CM | POA: Diagnosis not present

## 2018-10-09 ENCOUNTER — Telehealth: Payer: Self-pay | Admitting: Family Medicine

## 2018-10-09 DIAGNOSIS — R238 Other skin changes: Secondary | ICD-10-CM | POA: Diagnosis not present

## 2018-10-09 DIAGNOSIS — J441 Chronic obstructive pulmonary disease with (acute) exacerbation: Secondary | ICD-10-CM

## 2018-10-09 DIAGNOSIS — E538 Deficiency of other specified B group vitamins: Secondary | ICD-10-CM | POA: Diagnosis not present

## 2018-10-09 MED ORDER — DOXYCYCLINE HYCLATE 100 MG PO TABS: 100.0000 mg | ORAL_TABLET | Freq: Two times a day (BID) | ORAL | 0 refills | Status: DC

## 2018-10-09 MED ORDER — HYDROCOD POLST-CPM POLST ER 10-8 MG/5ML PO SUER: 5.0000 mL | Freq: Two times a day (BID) | ORAL | 0 refills | Status: DC | PRN

## 2018-10-09 MED ORDER — PREDNISONE 20 MG PO TABS: ORAL_TABLET | ORAL | 0 refills | Status: DC

## 2018-10-09 NOTE — Telephone Encounter (Signed)
Seen on 09/21/18, see note, deferred repeat antibiotic at that time, only prednisone.  Given recurrence, will try repeat coverage again for COPD with doxycycline and prednisone, similar to prior flare. She will follow-up as scheduled w/ Pulm on 10/31 already.  [name], DO [facility] 10/09/2018, 5:56 PM

## 2018-10-09 NOTE — Telephone Encounter (Signed)
Pt was in last week for sick visit and was told if she couldn't shake it antibiotics could be called in at [facility].  Pt asked for a refill on prednisone and a new prescription for tussinex.  Her call back number is [phone].  If not all, mainly antibiotics.

## 2018-10-10 LAB — CBC WITH DIFFERENTIAL/PLATELET
Basophils Absolute: 56 cells/uL (ref 0–200)
Basophils Relative: 0.3 %
Eosinophils Absolute: 131 cells/uL (ref 15–500)
Eosinophils Relative: 0.7 %
HCT: 37.4 % (ref 35.0–45.0)
Hemoglobin: 13.1 g/dL (ref 11.7–15.5)
LYMPHS ABS: 4544 {cells}/uL — AB (ref 850–3900)
MCH: 32.4 pg (ref 27.0–33.0)
MCHC: 35 g/dL (ref 32.0–36.0)
MCV: 92.6 fL (ref 80.0–100.0)
MPV: 9.1 fL (ref 7.5–12.5)
Monocytes Relative: 6.5 %
NEUTROS PCT: 68.2 %
Neutro Abs: 12753 cells/uL — ABNORMAL HIGH (ref 1500–7800)
Platelets: 324 10*3/uL (ref 140–400)
RBC: 4.04 10*6/uL (ref 3.80–5.10)
RDW: 12 % (ref 11.0–15.0)
Total Lymphocyte: 24.3 %
WBC: 18.7 10*3/uL — ABNORMAL HIGH (ref 3.8–10.8)
WBCMIX: 1216 {cells}/uL — AB (ref 200–950)

## 2018-10-10 LAB — PROTIME-INR
INR: 0.9
Prothrombin Time: 9.9 s (ref 9.0–11.5)

## 2018-10-10 LAB — VITAMIN B12: Vitamin B-12: 236 pg/mL (ref 200–1100)

## 2018-10-15 ENCOUNTER — Ambulatory Visit: Payer: Medicaid Other | Admitting: Internal Medicine

## 2018-10-16 ENCOUNTER — Encounter: Payer: Self-pay | Admitting: Internal Medicine

## 2018-10-20 ENCOUNTER — Ambulatory Visit (INDEPENDENT_AMBULATORY_CARE_PROVIDER_SITE_OTHER): Payer: Medicaid Other

## 2018-10-20 DIAGNOSIS — Z23 Encounter for immunization: Secondary | ICD-10-CM

## 2018-10-21 ENCOUNTER — Encounter: Payer: Self-pay | Admitting: Nurse Practitioner

## 2018-10-21 ENCOUNTER — Ambulatory Visit: Payer: Medicaid Other | Admitting: Nurse Practitioner

## 2018-10-21 NOTE — Progress Notes (Deleted)
Office Visit    Patient Name: Caroline Conley Date of Encounter: 10/21/2018  Primary Care Provider:  Olin Hauser, DO Primary Cardiologist:  [name], MD  Chief Complaint    ***  Past Medical History    Past Medical History:  Diagnosis Date  . Atypical chest pain    a. 2015 Cath ([place]): Nl cors. Nl EF.  . Cancer ([place]) 01/2016   basal cell   . COPD (chronic obstructive pulmonary disease) ([place])   . Emphysema of lung ([place])   . Palpitations    a. 07/2018 Echo: EF 60-65%, no rwma, Gr1 DD, Nl RV fxn; b. 48h Holter: predominantly RSR, avg rate 106 (69-166 bpm), Rare PAC's and occas PVC's. 2 atrial runs up to 5 beats. No sustained arrhythmias or prolonged pauses. No symptoms reported.  . Wears dentures    full upper and lower   Past Surgical History:  Procedure Laterality Date  . ABDOMINAL HYSTERECTOMY    . APPENDECTOMY    . BACK SURGERY     this will make third time  . CARDIAC CATHETERIZATION  09/20/14   [facility] - Dr. Clayborn Bigness  . COLONOSCOPY WITH PROPOFOL N/A 07/08/2016   Procedure: COLONOSCOPY WITH PROPOFOL;  Surgeon: Lucilla Lame, MD;  Location: [facility];  Service: Endoscopy;  Laterality: N/A;  . DEBRIDEMENT TENNIS ELBOW    . METATARSAL OSTEOTOMY  05/18/2013  . ROTATOR CUFF REPAIR    . TONSILLECTOMY      Allergies  Allergies  Allergen Reactions  . 2,4-D Dimethylamine (Amisol) Itching    (Pt unsure of this allergy)  . Morphine And Related Itching  . Vicodin [Hydrocodone-Acetaminophen] Nausea And Vomiting    GI distress    History of Present Illness    ***  Home Medications    Prior to Admission medications   Medication Sig Start Date End Date Taking? Authorizing Provider  budesonide (PULMICORT) 0.5 MG/2ML nebulizer solution Take 2 mLs (0.5 mg total) by nebulization 2 (two) times daily. 04/20/18 04/20/19  Flora Lipps, MD  chlorpheniramine-HYDROcodone (TUSSIONEX PENNKINETIC ER) 10-8 MG/5ML SUER Take 5 mLs by mouth every 12 (twelve)  hours as needed for cough. 10/09/18   Karamalegos, Devonne Doughty, DO  diltiazem (CARDIZEM CD) 120 MG 24 hr capsule  09/07/18   [provider]  diltiazem (DILACOR XR) 120 MG 24 hr capsule Take 1 capsule (120 mg total) by mouth daily. 09/07/18   End, Harrell Gave, MD  doxycycline (VIBRA-TABS) 100 MG tablet Take 1 tablet (100 mg total) by mouth 2 (two) times daily. For 10 days. Take with full glass of water, stay upright 30 min after taking. 10/09/18   Karamalegos, Devonne Doughty, DO  fluconazole (DIFLUCAN) 200 MG tablet TAKE 1 TABLET BY MOUTH ONCE DAILY AS NEEDED YEAST MAY REPEAT IN 48 HRS 09/24/18   Karamalegos, Devonne Doughty, DO  Fluticasone-Salmeterol (ADVAIR) 250-50 MCG/DOSE AEPB Inhale 1 puff into the lungs 2 (two) times daily. 02/05/18   Flora Lipps, MD  oxyCODONE-acetaminophen (PERCOCET) 10-325 MG tablet oxycodone-acetaminophen 10 mg-325 mg tablet  1 QIDPRN Fill on 07/30/2017    [provider]  predniSONE (DELTASONE) 20 MG tablet Take daily with food. Start with 60mg  (3 pills) x 2 days, then reduce to 40mg  (2 pills) x 2 days, then 20mg  (1 pill) x 3 days 10/09/18   Olin Hauser, DO  Tiotropium Bromide Monohydrate (SPIRIVA RESPIMAT) 2.5 MCG/ACT AERS Inhale 2 puffs into the lungs daily. 05/18/18   Wilhelmina Mcardle, MD  VENTOLIN HFA 108 (  90 Base) MCG/ACT inhaler INHALE 2 PUFFS INTO THE LUNGS EVERY 6 HOURS AS NEEDED FOR SHORTNESS OF BREATH OR WHEZZING. 07/17/18   Flora Lipps, MD    Review of Systems    ***.  All other systems reviewed and are otherwise negative except as noted above.  Physical Exam    VS:  There were no vitals taken for this visit. , BMI There is no height or weight on file to calculate BMI. GEN: Well nourished, well developed, in no acute distress. HEENT: normal. Neck: Supple, no JVD, carotid bruits, or masses. Cardiac: RRR, no murmurs, rubs, or gallops. No clubbing, cyanosis, edema.  Radials/DP/PT 2+ and equal bilaterally.  Respiratory:  Respirations  regular and unlabored, clear to auscultation bilaterally. GI: Soft, nontender, nondistended, BS + x 4. MS: no deformity or atrophy. Skin: warm and dry, no rash. Neuro:  Strength and sensation are intact. Psych: Normal affect.  Accessory Clinical Findings    ECG personally reviewed by me today - *** - no acute changes.  Assessment & Plan    1.  ***   Murray Hodgkins, NP 10/21/2018, 9:58 AM

## 2018-10-26 ENCOUNTER — Other Ambulatory Visit: Payer: Self-pay | Admitting: Internal Medicine

## 2018-10-28 ENCOUNTER — Ambulatory Visit (INDEPENDENT_AMBULATORY_CARE_PROVIDER_SITE_OTHER): Payer: Medicaid Other | Admitting: Nurse Practitioner

## 2018-10-28 ENCOUNTER — Encounter: Payer: Self-pay | Admitting: Nurse Practitioner

## 2018-10-28 VITALS — BP 118/72 | HR 98 | Ht 66.0 in | Wt 132.0 lb

## 2018-10-28 DIAGNOSIS — Z720 Tobacco use: Secondary | ICD-10-CM

## 2018-10-28 DIAGNOSIS — R002 Palpitations: Secondary | ICD-10-CM | POA: Diagnosis not present

## 2018-10-28 DIAGNOSIS — I200 Unstable angina: Secondary | ICD-10-CM | POA: Diagnosis not present

## 2018-10-28 MED ORDER — DILTIAZEM HCL ER 180 MG PO CP24: 180.0000 mg | ORAL_CAPSULE | Freq: Every day | ORAL | 1 refills | Status: DC

## 2018-10-28 NOTE — Progress Notes (Signed)
Office Visit    Patient Name: Caroline Conley Date of Encounter: 10/28/2018  Primary Care Provider:  Olin Hauser, DO Primary Cardiologist:  [name], MD  Chief Complaint    Caroline Conley is a 52 y.o. female with a history of tachycardia, palpitations s/p heart cath in 2015 with normal coronary arteries, COPD, atypical chest pain, prior tobacco use (quit 7 months ago), and basal cell carcinoma who presents today for follow-up of her holter monitor.  Past Medical History    Past Medical History:  Diagnosis Date  . Atypical chest pain    a. 2015 Cath ([place]): Nl cors. Nl EF.  . Cancer ([place]) 01/2016   basal cell   . COPD (chronic obstructive pulmonary disease) ([place])   . Emphysema of lung ([place])   . Palpitations    a. 07/2018 Echo: EF 60-65%, no rwma, Gr1 DD, Nl RV fxn; b. 07/2018 48h Holter: predominantly RSR, avg rate 106 (69-166 bpm), Rare PAC's and occas PVC's. 2 atrial runs up to 5 beats. No sustained arrhythmias or prolonged pauses. No symptoms reported.  . Wears dentures    full upper and lower   Past Surgical History:  Procedure Laterality Date  . ABDOMINAL HYSTERECTOMY    . APPENDECTOMY    . BACK SURGERY     this will make third time  . CARDIAC CATHETERIZATION  09/20/14   [facility] - Dr. Clayborn Bigness  . COLONOSCOPY WITH PROPOFOL N/A 07/08/2016   Procedure: COLONOSCOPY WITH PROPOFOL;  Surgeon: Lucilla Lame, MD;  Location: [facility];  Service: Endoscopy;  Laterality: N/A;  . DEBRIDEMENT TENNIS ELBOW    . METATARSAL OSTEOTOMY  05/18/2013  . ROTATOR CUFF REPAIR    . TONSILLECTOMY      Allergies  Allergies  Allergen Reactions  . 2,4-D Dimethylamine (Amisol) Itching    (Pt unsure of this allergy)  . Morphine And Related Itching  . Vicodin [Hydrocodone-Acetaminophen] Nausea And Vomiting    GI distress    History of Present Illness    Caroline Conley is a 52 y.o. female with a history of atypical chest pain s/p normal heart cath in 2015, COPD  with exacerbations requiring hospitalization, palpitations, and prior tobacco use (quit 7 months ago).  She was hospitalized on 6/7 - 05/24/2018 for COPD exacerbation and was told that her heart rate was elevated.  CTA of the chest showed emphysema without PE or acute cardiac process.   She was seen in our office on 07/29/2018 for fast heart beat (sinus tach). Echo on 08/04/2018 showed EF 60-65%, G1DD.  On 08/06/2018 patient had a 48 hour monitor which showed average HR 106 bpm with no significant arrhythmia and rare PACs/PVCs.  She was started on Diltiazem 120 qd to help with the palpitations.  Today she reports continued sensation of fast heart beats, shortness of breath and chest pain.  Her palpitations are unchanged from previous and occur daily, she does not think the diltiazem helped.  She describes elevated heart rates with normal activities.  She does not experience sensations of skipped beats, or tachpalps per se, simply mildly elevated heart rates.  She also has substernal chest pain on exertion which she describes as "an elephant sitting on my chest", lasting for ~5 minutes, with associated shortness of breath, wheezing, and occasionally left shoulder pain.  She first noticed it several months ago and states that is has worsened somewhat since then.  She denies diaphoresis, N/V, edema, dizziness, or syncope.  The chest  pain goes away when she sits down to rest, and has never occurred while at rest.  Her shortness of breath is unchanged for the past "several months" occurring frequently, and she says that she wheezes "all the time".  She also has a chronic cough unchanged from previous.  At night she starts sleeping flat on the bed with one pillow but she wakes up severely short of breath, and has to move to a recliner to sleep. She is concerned that her chest pain, which was previously described as sharp right sided chest pain, has now changed and is more consistent with unstable angina.  Home Medications      Prior to Admission medications   Medication Sig Start Date End Date Taking? Authorizing Provider  budesonide (PULMICORT) 0.5 MG/2ML nebulizer solution Take 2 mLs (0.5 mg total) by nebulization 2 (two) times daily. 04/20/18 04/20/19  Flora Lipps, MD  chlorpheniramine-HYDROcodone (TUSSIONEX PENNKINETIC ER) 10-8 MG/5ML SUER Take 5 mLs by mouth every 12 (twelve) hours as needed for cough. 10/09/18   Karamalegos, Devonne Doughty, DO  diltiazem (DILACOR XR) 120 MG 24 hr capsule Take 1 capsule (120 mg total) by mouth daily. 09/07/18   End, Harrell Gave, MD  doxycycline (VIBRA-TABS) 100 MG tablet Take 1 tablet (100 mg total) by mouth 2 (two) times daily. For 10 days. Take with full glass of water, stay upright 30 min after taking. 10/09/18   Karamalegos, Devonne Doughty, DO  fluconazole (DIFLUCAN) 200 MG tablet TAKE 1 TABLET BY MOUTH ONCE DAILY AS NEEDED YEAST MAY REPEAT IN 48 HRS 09/24/18   Karamalegos, Devonne Doughty, DO  Fluticasone-Salmeterol (ADVAIR) 250-50 MCG/DOSE AEPB Inhale 1 puff into the lungs 2 (two) times daily. 02/05/18   Flora Lipps, MD  oxyCODONE-acetaminophen (PERCOCET) 10-325 MG tablet oxycodone-acetaminophen 10 mg-325 mg tablet  1 QIDPRN Fill on 07/30/2017    [provider]  predniSONE (DELTASONE) 20 MG tablet Take daily with food. Start with 60mg  (3 pills) x 2 days, then reduce to 40mg  (2 pills) x 2 days, then 20mg  (1 pill) x 3 days 10/09/18   Olin Hauser, DO  Tiotropium Bromide Monohydrate (SPIRIVA RESPIMAT) 2.5 MCG/ACT AERS Inhale 2 puffs into the lungs daily. 05/18/18   Wilhelmina Mcardle, MD  VENTOLIN HFA 108 (90 Base) MCG/ACT inhaler INHALE 2 PUFFS BY MOUTH EVERY 6 HOURS ASNEEDED WHEEZING/ SHORTNESS OFBREATH 10/26/18   Flora Lipps, MD    Review of Systems    Positive for chest pain, SOB, wheezing, cough, palpitations, and orthopnea. Denies edema, n/v, dizziness, syncope, or diaphoresis.  All other systems reviewed and are otherwise negative except as noted  above.  Physical Exam    VS:  BP 118/72 (BP Location: Left Arm, Patient Position: Sitting, Cuff Size: Normal)   Pulse 98   Ht 5\' 6"  (1.676 m)   Wt 132 lb (59.9 kg)   BMI 21.31 kg/m  , BMI Body mass index is 21.31 kg/m. GEN: Well nourished, well developed, thin woman in no acute distress. HEENT: normal. Neck: Supple, no JVD, carotid bruits, or masses. Cardiac: RRR, no murmurs, rubs, or gallops. No clubbing, cyanosis, edema.  Radials 2+ and equal bilaterally.  Respiratory:  Respirations regular and unlabored, diminished, coarse lung sounds/rhonchi with diffuse expiratory wheezes, without rales. GI: Soft, nontender, nondistended, BS + x 4. MS: no deformity or atrophy. Skin: warm and dry, no rash. Neuro:  Strength and sensation are intact. Psych: Normal affect.  Accessory Clinical Findings    ECG personally reviewed by me today - NSR  rate 98 bpm, unchanged from previous - no acute changes.  Assessment & Plan    1.  Unstable angina: Pt with COPD, chronic cough and SOB, s/p normal heart cath in 2015, normal echo in 07/2018, with exertional chest pain and SOB.  Symptoms are now different than what she experienced in 2015 (more sharp and right sided @ that time), and are described as being substernal chest heaviness.  Her EKG is reassuring.  Given ongoing risk factors with new symptoms, will further evaluate with Lexiscan Myoview scheduled for this Friday (10/15).  If nl, would recommend ongoing eval and treatment of COPD.  2. Palpitations: Holter in August showed sinus tachycardia.  No significant/sustained arrhythmias.  She continues to note mild elevation in heart rates with usual activities - no significant change since starting diltiazem, though HR 96 today (sl improved). She is amenable to increasing diltiazem to 180 mg daily to see if that helps.  She recognizes that her COPD is very likely the primary contributor to her elevated HR.  3. COPD/Tobacco Abuse:  Chronic, relatively stable  DOE, though she was recently placed on abx and steroid taper by PCP.  She notes frequent wheezing, esp with activity and also when lying flat  this is chronic.  Echo showed nl EF w/ Gr1 DD.  Pt is euvolemic on exam. She is followed by [facility].  Discussed importance of remaining off of cigarettes and congratulated her on being smoke-free for 7 months.  Unfortunately, multiple family members continue to smoke around her.  4.  Disposition: F/u myoview as above.  F/u in clinic in 2 mos or sooner if necessary.  Murray Hodgkins, NP 10/28/2018, 3:20 PM

## 2018-10-28 NOTE — Patient Instructions (Addendum)
Medication Instructions:  INCREASE Diltiazem to 180 mg daily  If you need a refill on your cardiac medications before your next appointment, please call your pharmacy.   Lab work: None ordered  Testing/Procedures: Your physician has requested that you have a lexiscan myoview. For further information please visit [email or web address]. Please follow instruction sheet, as given.  Follow-Up: At [facility], you and your health needs are our priority.  As part of our continuing mission to provide you with exceptional heart care, we have created designated Provider Care Teams.  These Care Teams include your primary Cardiologist (physician) and Advanced Practice Providers (APPs -  Physician Assistants and Nurse Practitioners) who all work together to provide you with the care you need, when you need it. You will need a follow up appointment in 2 months.You may see [name], MD or one of the following Advanced Practice Providers on your designated Care Team:   Murray Hodgkins, NP Christell Faith, PA-C . Marrianne Mood, PA-C  Any Other Special Instructions Will Be Listed Below (If Applicable).  [place]  Your provider has ordered a Stress Test with nuclear imaging. The purpose of this test is to evaluate the blood supply to your heart muscle. This procedure is referred to as a "Non-Invasive Stress Test." This is because other than having an IV started in your vein, nothing is inserted or "invades" your body. Cardiac stress tests are done to find areas of poor blood flow to the heart by determining the extent of coronary artery disease (CAD). Some patients exercise on a treadmill, which naturally increases the blood flow to your heart, while others who are unable to walk on a treadmill due to physical limitations have a pharmacologic/chemical stress agent called Lexiscan . This medicine will mimic walking on a treadmill by temporarily increasing your coronary blood flow.   Please note: these  test may take anywhere between 2-4 hours to complete  PLEASE REPORT TO [facility] AT THE FIRST DESK WILL DIRECT YOU WHERE TO GO  Date of Procedure:_____________________________________  Arrival Time for Procedure:______________________________  Instructions regarding medication:  None to hold  PLEASE NOTIFY THE OFFICE AT LEAST 24 HOURS IN ADVANCE IF YOU ARE UNABLE TO KEEP YOUR APPOINTMENT.  [phone] AND  PLEASE NOTIFY NUCLEAR MEDICINE AT [facility] AT LEAST 24 HOURS IN ADVANCE IF YOU ARE UNABLE TO KEEP YOUR APPOINTMENT. [phone]  How to prepare for your Myoview test:  1. Do not eat or drink after midnight 2. No caffeine for 24 hours prior to test 3. No smoking 24 hours prior to test. 4. Your medication may be taken with water.  If your doctor stopped a medication because of this test, do not take that medication. 5. Ladies, please do not wear dresses.  Skirts or pants are appropriate. Please wear a short sleeve shirt. 6. No perfume, cologne or lotion. 7. Wear comfortable walking shoes. No heels!

## 2018-10-30 ENCOUNTER — Ambulatory Visit
Admission: RE | Admit: 2018-10-30 | Discharge: 2018-10-30 | Disposition: A | Discharge status: Home / Self Care | Payer: Medicaid Other | Source: Ambulatory Visit | Attending: Nurse Practitioner | Admitting: Nurse Practitioner

## 2018-10-30 DIAGNOSIS — I200 Unstable angina: Secondary | ICD-10-CM | POA: Diagnosis not present

## 2018-10-30 LAB — NM MYOCAR MULTI W/SPECT W/WALL MOTION / EF
CSEPEDS: 0 s
CSEPHR: 71 %
Estimated workload: 1 METS
Exercise duration (min): 0 min
LV dias vol: 36 mL (ref 46–106)
LVSYSVOL: 11 mL
MPHR: 168 {beats}/min
Peak HR: 168 {beats}/min
Rest HR: 79 {beats}/min
SDS: 0
SRS: 2
SSS: 0
TID: 1

## 2018-10-30 MED ORDER — TECHNETIUM TC 99M TETROFOSMIN IV KIT
32.1700 | PACK | Freq: Once | INTRAVENOUS | Status: AC | PRN
  Administered 2018-10-30: 32.17 via INTRAVENOUS

## 2018-10-30 MED ORDER — REGADENOSON 0.4 MG/5ML IV SOLN
0.4000 mg | Freq: Once | INTRAVENOUS | Status: AC
  Administered 2018-10-30: 0.4 mg via INTRAVENOUS

## 2018-10-30 MED ORDER — TECHNETIUM TC 99M TETROFOSMIN IV KIT
11.0640 | PACK | Freq: Once | INTRAVENOUS | Status: AC | PRN
  Administered 2018-10-30: 11.064 via INTRAVENOUS

## 2018-11-04 ENCOUNTER — Encounter: Payer: Self-pay | Admitting: Internal Medicine

## 2018-11-04 ENCOUNTER — Ambulatory Visit: Payer: Medicaid Other | Admitting: Internal Medicine

## 2018-11-04 VITALS — BP 110/66 | HR 101 | Resp 16 | Ht 66.0 in | Wt 127.0 lb

## 2018-11-04 DIAGNOSIS — J441 Chronic obstructive pulmonary disease with (acute) exacerbation: Secondary | ICD-10-CM | POA: Diagnosis not present

## 2018-11-04 DIAGNOSIS — J449 Chronic obstructive pulmonary disease, unspecified: Secondary | ICD-10-CM

## 2018-11-04 MED ORDER — LEVOFLOXACIN 500 MG PO TABS: 500.0000 mg | ORAL_TABLET | Freq: Every day | ORAL | 0 refills | Status: AC

## 2018-11-04 MED ORDER — PREDNISONE 20 MG PO TABS: 20.0000 mg | ORAL_TABLET | Freq: Every day | ORAL | 0 refills | Status: DC

## 2018-11-04 MED ORDER — BUDESONIDE-FORMOTEROL FUMARATE 160-4.5 MCG/ACT IN AERO: 2.0000 | INHALATION_SPRAY | Freq: Two times a day (BID) | RESPIRATORY_TRACT | 6 refills | Status: DC

## 2018-11-04 MED ORDER — HYDROCOD POLST-CPM POLST ER 10-8 MG/5ML PO SUER: 5.0000 mL | Freq: Three times a day (TID) | ORAL | 0 refills | Status: DC | PRN

## 2018-11-04 NOTE — Patient Instructions (Signed)
Start prednisone 20 mg daily for 10 days  Levaquin 500 mg twice daily for 10 days  Tussionex as needed

## 2018-11-04 NOTE — Progress Notes (Signed)
   Name: MEDA DUDZINSKI MRN: [id number] DOB: 1966/07/09    CONSULTATION DATE:  11/04/2018   REFERRING MD : [name]  CC: COPD evaluation  Previous PFT 2017 ratio 18%, Fev1 2.2L 74% fef25/75 0.78L 22% CT of the chest reviewed with patient and family at last OV  there is no lung masses noted however there is areas of emphysema in the upper lobe region bilaterally  HISTORY OF PRESENT ILLNESS: Progressive SOB Ongoing wheezing +fevers +productive cough  Exposed to ongoing second hand smoke    Progressive symptoms despite being on Symbicort  +COPD exacerbation at this time      Allergies  Allergen Reactions  . 2,4-D Dimethylamine (Amisol) Itching    (Pt unsure of this allergy)  . Morphine And Related Itching  . Vicodin [Hydrocodone-Acetaminophen] Nausea And Vomiting    GI distress    Review of Systems:  Gen:  Denies  fever, sweats, chills weigh loss  HEENT: Denies blurred vision, double vision, ear pain, eye pain, hearing loss, nose bleeds, sore throat Cardiac:  No dizziness, chest pain or heaviness, chest tightness,edema, No JVD Resp:   + cough, +sputum production, +shortness of breath,+wheezing, -hemoptysis,  Gi: Denies swallowing difficulty, stomach pain, nausea or vomiting, diarrhea, constipation, bowel incontinence Gu:  Denies bladder incontinence, burning urine Ext:   Denies Joint pain, stiffness or swelling Skin: Denies  skin rash, easy bruising or bleeding or hives Endoc:  Denies polyuria, polydipsia , polyphagia or weight change Psych:   Denies depression, insomnia or hallucinations  Other:  All other systems negative     BP 110/66 (BP Location: Left Arm, Cuff Size: Normal)   Pulse (!) 101   Resp 16   Ht 5\' 6"  (1.676 m)   Wt 127 lb (57.6 kg)   SpO2 96%   BMI 20.50 kg/m    Physical Examination:  GENERAL:NAD, no fevers, chills, no weakness no fatigue HEAD: Normocephalic, atraumatic.  EYES: Pupils equal, round, reactive to light. Extraocular  muscles intact. No scleral icterus.  MOUTH: Moist mucosal membrane. Dentition intact. No abscess noted.  EAR, NOSE, THROAT: Clear without exudates. No external lesions.  NECK: Supple. No thyromegaly. No nodules. No JVD.  PULMONARY:  +wheezes CARDIOVASCULAR: S1 and S2. Regular rate and rhythm. No murmurs, rubs, or gallops. No edema. Pedal pulses 2+ bilaterally.  ALL OTHER ROS ARE NEGATIVE      ASSESSMENT AND PLAN  52 year old white female with moderate COPD Gold stage D With recurrent acute mild COPD exacerbation with constant exposure to secondhand smoke  COPD exacerbation Start prednisone 20 mg daily for 10 days  Start Levaquin 500 mg daily for 10 days   Cough related to her COPD exacerbation and wheezing Tussionex as needed    Emphysema based on CT scan findings From COPD and extensive smoking history Avoid secondhand smoke exposure   Deconditioned state -Recommend increased daily activity and exercise  Patient with recurrent COPD exacerbations Will need to address PDE 4 inhibitor and next Will consider theophylline therapy and next visit     Patient satisfied with Plan of action and management. All questions answered  Corrin Parker, M.D.  [name]  Medical Director [place] Director [facility]

## 2018-11-09 ENCOUNTER — Other Ambulatory Visit: Payer: Self-pay | Admitting: Family Medicine

## 2018-11-09 DIAGNOSIS — B370 Candidal stomatitis: Secondary | ICD-10-CM

## 2018-11-09 MED ORDER — MAGIC MOUTHWASH W/LIDOCAINE: ORAL | 0 refills | Status: DC

## 2018-11-09 MED ORDER — FLUCONAZOLE 200 MG PO TABS: ORAL_TABLET | ORAL | 2 refills | Status: DC

## 2018-11-10 ENCOUNTER — Ambulatory Visit: Payer: Medicaid Other | Admitting: Internal Medicine

## 2018-11-20 DIAGNOSIS — M791 Myalgia, unspecified site: Secondary | ICD-10-CM | POA: Diagnosis not present

## 2018-11-20 DIAGNOSIS — Z79899 Other long term (current) drug therapy: Secondary | ICD-10-CM | POA: Diagnosis not present

## 2018-11-20 DIAGNOSIS — M25512 Pain in left shoulder: Secondary | ICD-10-CM | POA: Diagnosis not present

## 2018-11-20 DIAGNOSIS — M545 Low back pain: Secondary | ICD-10-CM | POA: Diagnosis not present

## 2018-12-01 ENCOUNTER — Telehealth: Payer: Self-pay | Admitting: Internal Medicine

## 2018-12-01 DIAGNOSIS — S46011D Strain of muscle(s) and tendon(s) of the rotator cuff of right shoulder, subsequent encounter: Secondary | ICD-10-CM | POA: Diagnosis not present

## 2018-12-01 NOTE — Telephone Encounter (Signed)
Spoke to patient, she is going to call her pharmacy and have them send [place] the denial so we can work on PA for her inhaler.

## 2018-12-07 ENCOUNTER — Ambulatory Visit: Payer: Self-pay

## 2018-12-07 ENCOUNTER — Ambulatory Visit (INDEPENDENT_AMBULATORY_CARE_PROVIDER_SITE_OTHER): Payer: Medicaid Other | Admitting: Podiatry

## 2018-12-07 ENCOUNTER — Encounter: Payer: Self-pay | Admitting: Podiatry

## 2018-12-07 ENCOUNTER — Other Ambulatory Visit: Payer: Self-pay | Admitting: Podiatry

## 2018-12-07 DIAGNOSIS — D3610 Benign neoplasm of peripheral nerves and autonomic nervous system, unspecified: Secondary | ICD-10-CM

## 2018-12-07 DIAGNOSIS — M778 Other enthesopathies, not elsewhere classified: Secondary | ICD-10-CM

## 2018-12-07 DIAGNOSIS — M7752 Other enthesopathy of left foot: Secondary | ICD-10-CM | POA: Diagnosis not present

## 2018-12-07 DIAGNOSIS — M779 Enthesopathy, unspecified: Principal | ICD-10-CM

## 2018-12-07 NOTE — Progress Notes (Signed)
This patient presents the office with chief complaint of pain noted in her left forefoot for approximately 10 days.  She feels there is a tightness and puffiness noted at the site of her previous surgery.  She points to an area over the third metatarsal dorsally as the site of her pain.  She states she feels like she is walking on a rock for the last 10 days.  She denies any numbness or tingling into her toes.  She has tried to treat herself with Epson salts and hot and cold packs with minimal relief.  She does have a history of having surgery performed in February 2019.  She says she was doing well until approximately 10 days ago when pain started with no history of trauma or injury.  Vascular  Dorsalis pedis and posterior tibial pulses are palpable  B/L.  Capillary return  WNL.  Temperature gradient is  WNL.  Skin turgor  WNL  Sensorium  Senn Weinstein monofilament wire  WNL. Normal tactile sensation.  Nail Exam  Patient has normal nails with no evidence of bacterial or fungal infection.  Orthopedic  Exam  Muscle tone and muscle strength  WNL.  No limitations of motion feet  B/L.  No crepitus or joint effusion noted.  Foot type is unremarkable and digits show no abnormalities.  Patient has swelling and pain noted at the head of the third metatarsal and in the second metatarsal  interspace between the second and third met heads.  No pain noted upon range of motion of the third MPJ left.  Palpable pain noted to the nerve in the second interspace left foot.  Skin  No open lesions.  Normal skin texture and turgor.  ROV  Xray was taken revealing healing noted at the osteotomy site of the third metatarsal left foot.  There are screws noted in the first second and third metatarsal left foot.   Discussed this condition with this patient.  Explained that she is having inflammation around the nerve in the second interspace left foot.  Treated with injection therapy left foot.  Injection therapy using 1.0 cc. Of  2% xylocaine( 20 mg.) plus 1 cc. of kenalog-la ( 10 mg) plus 1/2 cc. of dexamethazone phosphate ( 2 mg).  Patient is to return to the office in 4 weeks for follow-up evaluation and treatment.  Gardiner Barefoot DPM I

## 2018-12-14 ENCOUNTER — Ambulatory Visit: Payer: Medicaid Other | Admitting: Family Medicine

## 2018-12-14 ENCOUNTER — Other Ambulatory Visit: Payer: Self-pay | Admitting: Family Medicine

## 2018-12-14 DIAGNOSIS — B370 Candidal stomatitis: Secondary | ICD-10-CM

## 2018-12-14 MED ORDER — MAGIC MOUTHWASH W/LIDOCAINE: ORAL | 0 refills | Status: DC

## 2018-12-18 ENCOUNTER — Ambulatory Visit: Payer: Medicaid Other | Admitting: Family Medicine

## 2018-12-21 DIAGNOSIS — S46011D Strain of muscle(s) and tendon(s) of the rotator cuff of right shoulder, subsequent encounter: Secondary | ICD-10-CM | POA: Diagnosis not present

## 2018-12-23 ENCOUNTER — Encounter: Payer: Self-pay | Admitting: Podiatry

## 2018-12-23 ENCOUNTER — Ambulatory Visit (INDEPENDENT_AMBULATORY_CARE_PROVIDER_SITE_OTHER): Payer: Medicaid Other

## 2018-12-23 ENCOUNTER — Ambulatory Visit (INDEPENDENT_AMBULATORY_CARE_PROVIDER_SITE_OTHER): Payer: Medicaid Other | Admitting: Podiatry

## 2018-12-23 DIAGNOSIS — M2042 Other hammer toe(s) (acquired), left foot: Secondary | ICD-10-CM

## 2018-12-23 DIAGNOSIS — M779 Enthesopathy, unspecified: Principal | ICD-10-CM

## 2018-12-23 DIAGNOSIS — M778 Other enthesopathies, not elsewhere classified: Secondary | ICD-10-CM

## 2018-12-23 DIAGNOSIS — M7752 Other enthesopathy of left foot: Secondary | ICD-10-CM | POA: Diagnosis not present

## 2018-12-23 MED ORDER — TRIAMCINOLONE ACETONIDE 10 MG/ML IJ SUSP
10.0000 mg | Freq: Once | INTRAMUSCULAR | Status: AC
  Administered 2018-12-23: 10 mg

## 2018-12-23 NOTE — Progress Notes (Signed)
Subjective:   Patient ID: Caroline Conley, female   DOB: 53 y.o.   MRN: [id number]   HPI Patient presents with inflammation pain of the second metatarsal phalangeal joint left stating that is occurred over the last couple months and did not respond to previous injection which was not done intra-articular   ROS      Objective:  Physical Exam  Neurovascular status intact with inflammation second MPJ left with mild fluid within the joint itself with no pain surrounding the area     Assessment:  Capsulitis second MPJ left     Plan:  Recommended a more aggressive treatment plan for this and she wants this understanding the risk of injection.  Today I did a proximal nerve block sterile prep applied second MPJ aspirated the joint could not a small amount of clear fluid injected quarter cc dexamethasone Kenalog and applied padding to reduce pressure of the joint surface patient will be seen back if symptoms persist

## 2018-12-30 ENCOUNTER — Encounter: Payer: Self-pay | Admitting: Family Medicine

## 2018-12-30 ENCOUNTER — Ambulatory Visit (INDEPENDENT_AMBULATORY_CARE_PROVIDER_SITE_OTHER): Payer: Medicaid Other | Admitting: Family Medicine

## 2018-12-30 VITALS — BP 130/85 | HR 113 | Temp 98.1°F | Resp 16 | Ht 66.0 in | Wt 136.0 lb

## 2018-12-30 DIAGNOSIS — E876 Hypokalemia: Secondary | ICD-10-CM

## 2018-12-30 DIAGNOSIS — R252 Cramp and spasm: Secondary | ICD-10-CM

## 2018-12-30 NOTE — Patient Instructions (Addendum)
Thank you for coming to the office today.  Likely muscle cramps are due to low potassium and magnesium and electrolyte imbalance  Most likely this is caused by frequent Albuterol inhaler use - this lowers body potassium  Sometimes too much water can dilute out electrolytes - try adding electrolytes back to your water 1-2 times a day, add to drinking bottle water with [place] or other option at grocery store  Leg cramps - Try spoonful of yellow mustard to relieve leg cramps or try daily to prevent the problem  - OTC natural option is Hyland's Leg Cramps (Dissolving tablet) take as needed for muscle cramps - OR another option, is Nelva Nay  Will notify you after lab results - we may add a rx Potassium and or Magnesium supplement - if test is normal - then just start a Multivitamin with mag / K in it   DUE for NON FASTING BLOOD WORK   SCHEDULE "Lab Only" visit in the morning at the clinic for lab draw in 1 day  - Make sure Lab Only appointment is at about 1 week before your next appointment, so that results will be available  For Lab Results, once available within 2-3 days of blood draw, you can can log in to MyChart online to view your results and a brief explanation. Also, we can discuss results at next follow-up visit.   Please schedule a Follow-up Appointment to: Return for Muscle cramps.  If you have any other questions or concerns, please feel free to call the office or send a message through [place]. You may also schedule an earlier appointment if necessary.  Additionally, you may be receiving a survey about your experience at our office within a few days to 1 week by e-mail or mail. We value your feedback.  [name], DO [place]

## 2018-12-30 NOTE — Progress Notes (Signed)
Subjective:    Patient ID: Caroline Conley, female    DOB: 08-Feb-1966, 53 y.o.   MRN: [id number]  Caroline Conley is a 53 y.o. female presenting on 12/30/2018 for leg cramp (hand cramps onset week getting worst every day)  Patient presents for a same day appointment.  HPI   Muscle Cramps, hands, lower extremity / Suspected Hypokalemia Reports recent problem with worsening muscle cramping and "locking" of fingers and hands at times, worse in lower extremity calves, wake up at night with symptoms. She has not tried any treatment for it. She drinks water for hydration. Not taking any electrolyte supplement or vitamin. Last labs >6 months ago. She admits prior history of Low K years ago. - Continues to use Albuterol inhaler / nebulizer regularly for COPD recently worse in past week Denies any injury, trauma, redness swelling, numbness tingling   Depression screen [facility] 2/9 12/30/2018 09/21/2018 08/13/2018  Decreased Interest 0 0 0  Down, Depressed, Hopeless 0 0 0  PHQ - 2 Score 0 0 0    Social History   Tobacco Use  . Smoking status: Former Smoker    Packs/day: 0.25    Years: 30.00    Pack years: 7.50    Types: Cigarettes    Last attempt to quit: 03/02/2018    Years since quitting: 0.8  . Smokeless tobacco: Former Systems developer  . Tobacco comment: 0.5 to 1 ppd >30 years  Substance Use Topics  . Alcohol use: No  . Drug use: No    Review of Systems Per HPI unless specifically indicated above     Objective:    BP 130/85   Pulse (!) 113   Temp 98.1 F (36.7 C) (Oral)   Resp 16   Ht 5\' 6"  (1.676 m)   Wt 136 lb (61.7 kg)   SpO2 99%   BMI 21.95 kg/m   Wt Readings from Last 3 Encounters:  12/30/18 136 lb (61.7 kg)  11/04/18 127 lb (57.6 kg)  10/28/18 132 lb (59.9 kg)    Physical Exam Vitals signs and nursing note reviewed.  Constitutional:      General: She is not in acute distress.    Appearance: She is well-developed. She is not diaphoretic.     Comments: Well-appearing,  comfortable, cooperative  HENT:     Head: Normocephalic and atraumatic.  Eyes:     General:        Right eye: No discharge.        Left eye: No discharge.     Conjunctiva/sclera: Conjunctivae normal.  Cardiovascular:     Rate and Rhythm: Normal rate.  Pulmonary:     Effort: Pulmonary effort is normal.  Skin:    General: Skin is warm and dry.     Findings: No erythema or rash.  Neurological:     Mental Status: She is alert and oriented to person, place, and time.  Psychiatric:        Behavior: Behavior normal.     Comments: Well groomed, good eye contact, normal speech and thoughts    Results for orders placed or performed in visit on 10/28/18  NM Myocar Multi W/Spect W/Wall Motion / EF  Result Value Ref Range   Rest HR 79 bpm   Rest BP 106/73 mmHg   Exercise duration (sec) 0 sec   Percent HR 71 %   Exercise duration (min) 0 min   Estimated workload 1.0 METS   Peak HR 168 bpm   Peak BP  117/61 mmHg   MPHR 168 bpm   SSS 0    SRS 2    SDS 0    TID 1.00    LV sys vol 11 mL   LV dias vol 36 46 - 106 mL      Assessment & Plan:   Problem List Items Addressed This Visit    None    Visit Diagnoses    Muscle cramping    -  Primary   Relevant Orders   Basic Metabolic Panel (BMET)   Magnesium   Hypokalemia       Relevant Orders   Basic Metabolic Panel (BMET)   Magnesium      Clinically with episodic worsening muscle cramping, suspect poor hydration, with reduced electrolyte intake. Also suspect hypokalemia with frequent albuterol usage  Plan Check labs tomorrow BMET, Mag - replete as indicated - may need a daily K supplement if using albuterol regularly. Also consider mag supplement - if normal can try MVI. - Advised improve hydration, with additional electrolyte support added - Also review home remedy per AVS, mustard PRN - Trial OTC Hyland Leg Cramp PRN - Follow-up as needed - future may consider muscle relaxant if needed  No orders of the defined types were  placed in this encounter.   Follow up plan: Return for Muscle cramps.  Future lab ordered for tomorrow 1/16  Caroline Conley, [facility] 12/30/2018, 2:01 PM

## 2018-12-31 ENCOUNTER — Ambulatory Visit: Payer: Medicaid Other | Admitting: Internal Medicine

## 2018-12-31 ENCOUNTER — Other Ambulatory Visit: Payer: Self-pay

## 2018-12-31 DIAGNOSIS — E876 Hypokalemia: Secondary | ICD-10-CM | POA: Diagnosis not present

## 2018-12-31 DIAGNOSIS — R252 Cramp and spasm: Secondary | ICD-10-CM | POA: Diagnosis not present

## 2019-01-01 DIAGNOSIS — M25511 Pain in right shoulder: Secondary | ICD-10-CM | POA: Diagnosis not present

## 2019-01-01 DIAGNOSIS — S46011A Strain of muscle(s) and tendon(s) of the rotator cuff of right shoulder, initial encounter: Secondary | ICD-10-CM | POA: Diagnosis not present

## 2019-01-01 LAB — BASIC METABOLIC PANEL
BUN / CREAT RATIO: 9 (calc) (ref 6–22)
BUN: 11 mg/dL (ref 7–25)
CO2: 27 mmol/L (ref 20–32)
Calcium: 9.6 mg/dL (ref 8.6–10.4)
Chloride: 100 mmol/L (ref 98–110)
Creat: 1.16 mg/dL — ABNORMAL HIGH (ref 0.50–1.05)
Glucose, Bld: 109 mg/dL — ABNORMAL HIGH (ref 65–99)
Potassium: 4.3 mmol/L (ref 3.5–5.3)
Sodium: 134 mmol/L — ABNORMAL LOW (ref 135–146)

## 2019-01-01 LAB — MAGNESIUM: Magnesium: 1.8 mg/dL (ref 1.5–2.5)

## 2019-01-06 ENCOUNTER — Ambulatory Visit: Payer: Medicaid Other | Admitting: Podiatry

## 2019-01-12 ENCOUNTER — Encounter: Payer: Self-pay | Admitting: Podiatry

## 2019-01-12 ENCOUNTER — Ambulatory Visit (INDEPENDENT_AMBULATORY_CARE_PROVIDER_SITE_OTHER): Payer: Medicaid Other | Admitting: Nurse Practitioner

## 2019-01-12 ENCOUNTER — Encounter: Payer: Self-pay | Admitting: Nurse Practitioner

## 2019-01-12 VITALS — BP 120/70 | HR 84 | Ht 66.0 in | Wt 135.0 lb

## 2019-01-12 DIAGNOSIS — R55 Syncope and collapse: Secondary | ICD-10-CM | POA: Diagnosis not present

## 2019-01-12 DIAGNOSIS — R079 Chest pain, unspecified: Secondary | ICD-10-CM

## 2019-01-12 DIAGNOSIS — R002 Palpitations: Secondary | ICD-10-CM

## 2019-01-12 DIAGNOSIS — M25511 Pain in right shoulder: Secondary | ICD-10-CM | POA: Diagnosis not present

## 2019-01-12 MED ORDER — DILTIAZEM HCL ER 120 MG PO CP24: 120.0000 mg | ORAL_CAPSULE | Freq: Every day | ORAL | 3 refills | Status: DC

## 2019-01-12 NOTE — Progress Notes (Signed)
Office Visit    Patient Name: Caroline Conley Date of Encounter: 01/12/2019  Primary Care Provider:  Olin Hauser, DO Primary Cardiologist:  [name], MD  Chief Complaint    53 y/o ? with a h/o tachypalpitations, atypical chest pain with nl cors by cath in 2015, COPD, prior tobacco abuse, and basal cell carcinoma who presents for follow-up after recent stress testing.  Past Medical History    Past Medical History:  Diagnosis Date  . Atypical chest pain    a. 2015 Cath ([place]): Nl cors. Nl EF; b. 10/2018 MV: No ischemia/infarct. EF>65%.  . Cancer ([place]) 01/2016   basal cell   . COPD (chronic obstructive pulmonary disease) ([place])   . Emphysema of lung ([place])   . Palpitations    a. 07/2018 Echo: EF 60-65%, no rwma, Gr1 DD, Nl RV fxn; b. 07/2018 48h Holter: predominantly RSR, avg rate 106 (69-166 bpm), Rare PAC's and occas PVC's. 2 atrial runs up to 5 beats. No sustained arrhythmias or prolonged pauses. No symptoms reported.  . Wears dentures    full upper and lower   Past Surgical History:  Procedure Laterality Date  . ABDOMINAL HYSTERECTOMY    . APPENDECTOMY    . BACK SURGERY     this will make third time  . CARDIAC CATHETERIZATION  09/20/14   [facility] - Dr. Clayborn Bigness  . COLONOSCOPY WITH PROPOFOL N/A 07/08/2016   Procedure: COLONOSCOPY WITH PROPOFOL;  Surgeon: Lucilla Lame, MD;  Location: [facility];  Service: Endoscopy;  Laterality: N/A;  . DEBRIDEMENT TENNIS ELBOW    . METATARSAL OSTEOTOMY  05/18/2013  . ROTATOR CUFF REPAIR    . TONSILLECTOMY      Allergies  Allergies  Allergen Reactions  . 2,4-D Dimethylamine (Amisol) Itching    (Pt unsure of this allergy)  . Morphine And Related Itching  . Vicodin [Hydrocodone-Acetaminophen] Nausea And Vomiting    GI distress    History of Present Illness    54 year old female with the above past medical history including atypical chest pain status post catheterization 2015 revealing normal coronary  arteries, COPD, prior tobacco abuse, and palpitations.  She was evaluated in the summer 2019 secondary to tachypalpitations-sinus tachycardia.  An echocardiogram in August 20 showed normal LV function with grade 1 diastolic dysfunction.  She also wore a Holter monitor which showed an average heart rate of 106 bpm with no significant arrhythmia and rare PACs/PVCs.  She was placed on diltiazem at that time and when she was seen back in clinic on November 13, she continued to report elevated heart rates with normal activities as well as intermittent chest discomfort and dyspnea.  In that setting, we arranged for stress testing, which was nonischemic.  Since her last visit, she has done reasonably well.  She was encouraged by her negative stress test but still notes occasional exertional chest tightness as much as a few times per week, lasting a few minutes, resolving with rest.  She also has chronic dyspnea on exertion and wheezing in the setting of COPD.  This is overall unchanged.  She is fairly active and frequently watches multiple grandchildren.  Her frequency of palpitations is unchanged, noting 1 or 2 isolated palpitations most days of the week but no sustained tachycardias.  This past Sunday, she was in her kitchen and fixing a snack and then when she started walking she had sudden lightheadedness followed by syncope.  Her daughter saw this and helped her to the floor.  She thinks she was without consciousness for under a minute.  When she regained consciousness, she did not note any lightheadedness, chest pain, dyspnea, diaphoresis, nausea, or vomiting.  Since then however, she has noted some lightheadedness when moving from a lying or seated to a standing position.  She thinks she is hydrates well though recent labs showed mild creatinine elevation at 1.16.  She denies PND, orthopnea, edema, or early satiety.  Home Medications    Prior to Admission medications   Medication Sig Start Date End Date  Taking? Authorizing Provider  budesonide (PULMICORT) 0.5 MG/2ML nebulizer solution Take 2 mLs (0.5 mg total) by nebulization 2 (two) times daily. 04/20/18 04/20/19 Yes Kasa, Maretta Bees, MD  budesonide-formoterol (SYMBICORT) 160-4.5 MCG/ACT inhaler Inhale 2 puffs into the lungs 2 (two) times daily. 11/04/18  Yes Flora Lipps, MD  chlorpheniramine-HYDROcodone (TUSSIONEX PENNKINETIC ER) 10-8 MG/5ML SUER Take 5 mLs by mouth 3 (three) times daily as needed for cough. 11/04/18  Yes Flora Lipps, MD  diazepam (VALIUM) 2 MG tablet Valium 2 mg tablet  Take 1 tab 1 hour prior to MRI then 1 tab 1/2 hr prior if needed.   Yes [provider]  diltiazem (DILACOR XR) 180 MG 24 hr capsule Take 1 capsule (180 mg total) by mouth daily. 10/28/18  Yes Theora Gianotti, NP  fluconazole (DIFLUCAN) 200 MG tablet TAKE 1 TABLET BY MOUTH ONCE DAILY AS NEEDED YEAST MAY REPEAT IN 48 HRS 11/09/18  Yes Karamalegos, Devonne Doughty, DO  magic mouthwash w/lidocaine SOLN Swish, gargle, and spit one to two teaspoonfuls every six hours as needed. Shake well before using. 12/14/18  Yes Karamalegos, Devonne Doughty, DO  oxyCODONE-acetaminophen (PERCOCET) 10-325 MG tablet oxycodone-acetaminophen 10 mg-325 mg tablet  1 QIDPRN Fill on 07/30/2017   Yes [provider]  predniSONE (DELTASONE) 20 MG tablet Take 1 tablet (20 mg total) by mouth daily with breakfast. 10 days 11/04/18  Yes Flora Lipps, MD  SPIRIVA RESPIMAT 2.5 MCG/ACT AERS  12/01/18  Yes [provider]  UNABLE TO FIND diltiazem CD 180 mg capsule,extended release 24 hr   Yes [provider]  VENTOLIN HFA 108 (90 Base) MCG/ACT inhaler INHALE 2 PUFFS BY MOUTH EVERY 6 HOURS ASNEEDED WHEEZING/ SHORTNESS OFBREATH 10/26/18  Yes Flora Lipps, MD    Review of Systems    Syncopal episode earlier this week.  Occasional exertional chest tightness-stable over several years.  Chronic dyspnea on exertion and wheezing in the setting of prior smoking history.   Occasional brief palpitations.  No PND, orthopnea, edema, or early satiety.  All other systems reviewed and are otherwise negative except as noted above.  Physical Exam    VS:  BP 120/70 (BP Location: Left Arm, Patient Position: Sitting, Cuff Size: Normal)   Pulse 84   Ht 5\' 6"  (1.676 m)   Wt 135 lb (61.2 kg)   BMI 21.79 kg/m  , BMI Body mass index is 21.79 kg/m. Orthostatic VS for the past 24 hrs:  BP- Lying Pulse- Lying BP- Sitting Pulse- Sitting BP- Standing at 0 minutes Pulse- Standing at 0 minutes  01/12/19 1200 117/81 82 121/83 83 110/77 93     GEN: Thin, in no acute distress. HEENT: normal. Neck: Supple, no JVD, carotid bruits, or masses. Cardiac: RRR, no murmurs, rubs, or gallops. No clubbing, cyanosis, edema.  Radials/PT 2+ and equal bilaterally.  Respiratory:  Respirations regular and unlabored, diffuse bilateral inspiratory and expiratory wheezing. GI: Soft, nontender, nondistended, BS + x 4. MS: no deformity or  atrophy. Skin: warm and dry, no rash. Neuro:  Strength and sensation are intact. Psych: Normal affect.  Accessory Clinical Findings    ECG personally reviewed by me today -regular sinus rhythm, 84, probable left atrial enlargement- no acute changes.  Lab Results  Component Value Date   CREATININE 1.16 (H) 12/31/2018   BUN 11 12/31/2018   NA 134 (L) 12/31/2018   K 4.3 12/31/2018   CL 100 12/31/2018   CO2 27 12/31/2018   Lab Results  Component Value Date   WBC 18.7 (H) 10/09/2018   HGB 13.1 10/09/2018   HCT 37.4 10/09/2018   MCV 92.6 10/09/2018   PLT 324 10/09/2018    Assessment & Plan    1.  Syncope: Patient suffered a syncopal spell while walking in her kitchen 2 days ago.  She says it felt like the floor fell out from under her.  She does not remember falling and believes she was without consciousness for under a minute.  This was witnessed by her daughter.  Since then, she has had some orthostatic lightheadedness.  She is minimally orthostatic  by heart rate and blood pressure today.  I am going to reduce her diltiazem to 120 mg daily.  Recent lab work on January 16 showed mild creatinine elevation.  I did advise her to be sure that she is hydrating appropriately.  In the setting of syncope with prior history of palpitations, I will place a two-week ZIO monitor to assess for arrhythmias.  She recently had a negative Myoview with normal LV function in November 2019 and prior normal echo in August 2019.  2.  Chest pain: Patient with a long history of intermittent exertional chest tightness.  She also has chronic dyspnea on exertion in setting of COPD and wheezing.  Recent Myoview in November was negative for ischemia.  Symptoms have been stable.  No further ischemic evaluation at this time.  3.  Palpitations: Stable on diltiazem therapy.  Previous Holter showed 2 atrial runs up to 5 beats but no sustained arrhythmias or prolonged pauses.  In the setting of syncope earlier this week, I am placing an event monitor for 2 weeks.  As above, I am reducing diltiazem dose in the setting of orthostatic symptoms.  4.  COPD: She quit smoking last year.  She remains on Pulmicort, Symbicort, and albuterol as needed.  She is wheezing today.  She says overall, her chronic dyspnea on exertion has been stable.  5.  Disposition: Follow-up ZIO monitoring.  Follow-up in clinic in 6 to 8 weeks.  Murray Hodgkins, NP 01/12/2019, 12:08 PM

## 2019-01-12 NOTE — Patient Instructions (Signed)
Medication Instructions:  Your physician has recommended you make the following change in your medication:  1- DECREASE Diltiazem to 1 tablet (120 mg) once daily   If you need a refill on your cardiac medications before your next appointment, please call your pharmacy.   Lab work: None ordered  If you have labs (blood work) drawn today and your tests are completely normal, you will receive your results only by: Marland Kitchen MyChart Message (if you have MyChart) OR . A paper copy in the mail If you have any lab test that is abnormal or we need to change your treatment, we will call you to review the results.  Testing/Procedures: 1- Zio AT   Zio AT: We will place order and you will receive it in the mail.  You may get a call from [place] @ either  [phone] Or  [phone] for them to confirm your address before it will be sent to you.  You will wear the monitor for 14 days, remove it and send it back to the company. They will send [place] a report. Then we will call you with the results. PLEASE CALL THE OFFICE WHEN YOU PLACE MONITOR. [phone]   Follow-Up: At [facility], you and your health needs are our priority.  As part of our continuing mission to provide you with exceptional heart care, we have created designated Provider Care Teams.  These Care Teams include your primary Cardiologist (physician) and Advanced Practice Providers (APPs -  Physician Assistants and Nurse Practitioners) who all work together to provide you with the care you need, when you need it. You will need a follow up appointment in 6-8 weeks. You may see [name], MD or one of the following Advanced Practice Providers on your designated Care Team:   Murray Hodgkins, NP Christell Faith, PA-C . Marrianne Mood, PA-C

## 2019-01-17 DIAGNOSIS — X501XXA Overexertion from prolonged static or awkward postures, initial encounter: Secondary | ICD-10-CM | POA: Diagnosis not present

## 2019-01-17 DIAGNOSIS — F17210 Nicotine dependence, cigarettes, uncomplicated: Secondary | ICD-10-CM | POA: Diagnosis not present

## 2019-01-17 DIAGNOSIS — Z79891 Long term (current) use of opiate analgesic: Secondary | ICD-10-CM | POA: Diagnosis not present

## 2019-01-17 DIAGNOSIS — M545 Low back pain: Secondary | ICD-10-CM | POA: Diagnosis not present

## 2019-01-17 DIAGNOSIS — M5431 Sciatica, right side: Secondary | ICD-10-CM | POA: Diagnosis not present

## 2019-01-17 DIAGNOSIS — S39012A Strain of muscle, fascia and tendon of lower back, initial encounter: Secondary | ICD-10-CM | POA: Diagnosis not present

## 2019-01-17 DIAGNOSIS — Z981 Arthrodesis status: Secondary | ICD-10-CM | POA: Diagnosis not present

## 2019-01-20 DIAGNOSIS — S46011D Strain of muscle(s) and tendon(s) of the rotator cuff of right shoulder, subsequent encounter: Secondary | ICD-10-CM | POA: Diagnosis not present

## 2019-01-25 ENCOUNTER — Telehealth: Payer: Self-pay

## 2019-01-25 DIAGNOSIS — S46011A Strain of muscle(s) and tendon(s) of the rotator cuff of right shoulder, initial encounter: Secondary | ICD-10-CM | POA: Diagnosis not present

## 2019-01-25 DIAGNOSIS — M25511 Pain in right shoulder: Secondary | ICD-10-CM | POA: Diagnosis not present

## 2019-01-25 DIAGNOSIS — G8918 Other acute postprocedural pain: Secondary | ICD-10-CM | POA: Diagnosis not present

## 2019-01-25 NOTE — Telephone Encounter (Signed)
Received records request from [place] to [employer].

## 2019-01-28 ENCOUNTER — Telehealth: Payer: Self-pay | Admitting: *Deleted

## 2019-01-28 NOTE — Telephone Encounter (Signed)
Received call from [employer] that they have been unable to reach patient and requested that we reach out to her and have her call in with reference number. Number was [phone] Ref# [id number]  Left detailed voicemail message on patients phone with number, reference number, and instructions to call for billing with our number as well if she should have any further questions.

## 2019-02-01 ENCOUNTER — Ambulatory Visit (INDEPENDENT_AMBULATORY_CARE_PROVIDER_SITE_OTHER): Payer: Medicaid Other

## 2019-02-01 DIAGNOSIS — R55 Syncope and collapse: Secondary | ICD-10-CM

## 2019-02-19 ENCOUNTER — Telehealth: Payer: Self-pay | Admitting: Family Medicine

## 2019-02-19 DIAGNOSIS — M25512 Pain in left shoulder: Secondary | ICD-10-CM | POA: Diagnosis not present

## 2019-02-19 DIAGNOSIS — Z5181 Encounter for therapeutic drug level monitoring: Secondary | ICD-10-CM | POA: Diagnosis not present

## 2019-02-19 DIAGNOSIS — M791 Myalgia, unspecified site: Secondary | ICD-10-CM | POA: Diagnosis not present

## 2019-02-19 DIAGNOSIS — Z79891 Long term (current) use of opiate analgesic: Secondary | ICD-10-CM | POA: Diagnosis not present

## 2019-02-19 DIAGNOSIS — M545 Low back pain: Secondary | ICD-10-CM | POA: Diagnosis not present

## 2019-02-19 NOTE — Telephone Encounter (Signed)
Can you check on this - I believe this is a Medicaid Access approval for the apt on Monday?  She should not need new order in system from me.  [name], [facility] 02/19/2019, 2:26 PM

## 2019-02-19 NOTE — Telephone Encounter (Signed)
Pt. called said she need a referral to [facility] for Monday for  Dr. Evorn Gong.Per pt they called today with appt. Pt call back # is  [phone]

## 2019-02-19 NOTE — Telephone Encounter (Signed)
The form needed to be fill out from [facility] with NPI number and how many visit and duration. Called [name] she will take care of this verbal was given for 6 visit and 6 months.

## 2019-02-25 DIAGNOSIS — M25511 Pain in right shoulder: Secondary | ICD-10-CM | POA: Diagnosis not present

## 2019-02-25 DIAGNOSIS — M25611 Stiffness of right shoulder, not elsewhere classified: Secondary | ICD-10-CM | POA: Diagnosis not present

## 2019-02-28 DIAGNOSIS — R42 Dizziness and giddiness: Secondary | ICD-10-CM | POA: Diagnosis not present

## 2019-03-02 ENCOUNTER — Telehealth: Payer: Self-pay | Admitting: Family Medicine

## 2019-03-02 ENCOUNTER — Ambulatory Visit (INDEPENDENT_AMBULATORY_CARE_PROVIDER_SITE_OTHER): Payer: Medicaid Other | Admitting: Family Medicine

## 2019-03-02 ENCOUNTER — Ambulatory Visit: Payer: Medicaid Other | Admitting: Nurse Practitioner

## 2019-03-02 ENCOUNTER — Other Ambulatory Visit: Payer: Self-pay

## 2019-03-02 ENCOUNTER — Telehealth: Payer: Self-pay

## 2019-03-02 ENCOUNTER — Encounter: Payer: Self-pay | Admitting: Family Medicine

## 2019-03-02 VITALS — BP 132/80 | HR 80 | Temp 98.5°F | Resp 16 | Ht 66.0 in | Wt 135.0 lb

## 2019-03-02 DIAGNOSIS — J441 Chronic obstructive pulmonary disease with (acute) exacerbation: Secondary | ICD-10-CM | POA: Diagnosis not present

## 2019-03-02 DIAGNOSIS — J431 Panlobular emphysema: Secondary | ICD-10-CM | POA: Diagnosis not present

## 2019-03-02 MED ORDER — HYDROCOD POLST-CPM POLST ER 10-8 MG/5ML PO SUER: 5.0000 mL | Freq: Three times a day (TID) | ORAL | 0 refills | Status: DC | PRN

## 2019-03-02 MED ORDER — PREDNISONE 50 MG PO TABS: 50.0000 mg | ORAL_TABLET | Freq: Every day | ORAL | 0 refills | Status: DC

## 2019-03-02 MED ORDER — LEVOFLOXACIN 500 MG PO TABS: 500.0000 mg | ORAL_TABLET | Freq: Every day | ORAL | 0 refills | Status: DC

## 2019-03-02 NOTE — Telephone Encounter (Signed)
Patient calling back Decided to cancel appointment, does not want to risk coming in due to [place]

## 2019-03-02 NOTE — Progress Notes (Signed)
Subjective:    Patient ID: Caroline Conley, female    DOB: [birth year], 53 y.o.   MRN: [id number]  Caroline Conley is a 53 y.o. female presenting on 03/02/2019 for COPD (SOB, chest congestion, coughing onset 3 days)  Patient presents for a same day appointment. Patient was triaged prior to appointment, and was determined to not have fever and no high risk travel or known exposures that would increase risk of COVID19.  HPI   ACUTE COPD EXACERBATION / Panlobular Emphysema - Last visit for similar AECOPD flare 09/2018 - at that time treated with prednisone burst only then limited result, later required antibiotic doxycycline repeat prednisone and tussionx, see prior note - Interval update - she has done very well over winter season without any significant COPD exacerbation in about 5-6 months - Followed by Dr [facility], has not been back recently since 10/2018 - Today she reports worse wheezing and coughing and shortness of breath for past 3 days, she wanted to come in to get it evaluated and treated earlier than usual. Feels similar to prior COPD - She denies any other sick symptoms at this time. She has mostly remained at home and avoided contact with other people due to risk of COVID19 Denies myalgias, fever chills, hemoptysis, chest pain, sweats, nausea vomiting  Health Maintenance: UTD Flu vaccine 10/2018  Depression screen [facility] 2/9 03/02/2019 12/30/2018 09/21/2018  Decreased Interest 0 0 0  Down, Depressed, Hopeless 0 0 0  PHQ - 2 Score 0 0 0    Social History   Tobacco Use  . Smoking status: Former Smoker    Packs/day: 0.25    Years: 30.00    Pack years: 7.50    Types: Cigarettes    Last attempt to quit: 03/02/2018    Years since quitting: 1.0  . Smokeless tobacco: Former Systems developer  . Tobacco comment: 0.5 to 1 ppd >30 years  Substance Use Topics  . Alcohol use: No  . Drug use: No    Review of Systems Per HPI unless specifically indicated above     Objective:   BP 132/80   Pulse 80   Temp 98.5 F (36.9 C) (Oral)   Resp 16   Ht 5\' 6"  (1.676 m)   Wt 135 lb (61.2 kg)   SpO2 97%   BMI 21.79 kg/m   Wt Readings from Last 3 Encounters:  03/02/19 135 lb (61.2 kg)  01/12/19 135 lb (61.2 kg)  12/30/18 136 lb (61.7 kg)    Physical Exam Vitals signs and nursing note reviewed.  Constitutional:      General: She is not in acute distress.    Appearance: She is well-developed. She is not diaphoretic.     Comments: Well-appearing, comfortable, cooperative  HENT:     Head: Normocephalic and atraumatic.  Eyes:     General:        Right eye: No discharge.        Left eye: No discharge.     Conjunctiva/sclera: Conjunctivae normal.  Neck:     Musculoskeletal: Normal range of motion and neck supple.     Thyroid: No thyromegaly.  Cardiovascular:     Rate and Rhythm: Normal rate and regular rhythm.     Heart sounds: Normal heart sounds. No murmur.  Pulmonary:     Effort: Pulmonary effort is normal. No respiratory distress.     Breath sounds: Wheezing present. No rales.     Comments: Diffuse end expiratory wheezes, coarse breath  sounds, non focal. No active coughing in office today. Speaks full sentences. Musculoskeletal: Normal range of motion.  Lymphadenopathy:     Cervical: No cervical adenopathy.  Skin:    General: Skin is warm and dry.     Findings: No erythema or rash.  Neurological:     Mental Status: She is alert and oriented to person, place, and time.  Psychiatric:        Behavior: Behavior normal.     Comments: Well groomed, good eye contact, normal speech and thoughts    Results for orders placed or performed in visit on 12/31/18  Magnesium  Result Value Ref Range   Magnesium 1.8 1.5 - 2.5 mg/dL  Basic Metabolic Panel (BMET)  Result Value Ref Range   Glucose, Bld 109 (H) 65 - 99 mg/dL   BUN 11 7 - 25 mg/dL   Creat 1.16 (H) 0.50 - 1.05 mg/dL   BUN/Creatinine Ratio 9 6 - 22 (calc)   Sodium 134 (L) 135 - 146 mmol/L   Potassium  4.3 3.5 - 5.3 mmol/L   Chloride 100 98 - 110 mmol/L   CO2 27 20 - 32 mmol/L   Calcium 9.6 8.6 - 10.4 mg/dL      Assessment & Plan:   Problem List Items Addressed This Visit    Panlobular emphysema (HCC)   Relevant Medications   levofloxacin (LEVAQUIN) 500 MG tablet   predniSONE (DELTASONE) 50 MG tablet   chlorpheniramine-HYDROcodone (TUSSIONEX PENNKINETIC ER) 10-8 MG/5ML SUER    Other Visit Diagnoses    COPD with acute exacerbation (HCC)    -  Primary   Relevant Medications   levofloxacin (LEVAQUIN) 500 MG tablet   predniSONE (DELTASONE) 50 MG tablet   chlorpheniramine-HYDROcodone (TUSSIONEX PENNKINETIC ER) 10-8 MG/5ML SUER      Consistent with mild early acute exacerbation of COPD with worsening wheezing and dyspnea, in setting of patient with chronic complex COPD emphysema with recurrent flares. Similar to last exacerbation 09/2018 - No hypoxia (97% on RA), afebrile, no recent hospitalization Followed by Dr Karilyn Cota  Plan: 1. Start Prednisone 50mg  x 5 day steroid burst 2. Start taking Levaquin antibiotic 500mg  daily x 7 days 3. Tussionex rx - effective in past, failed other cough therapy before 4. Use Albuterol / inhaler / neb PRN - continue current maintenance therapy  Follow-up within 1 week if not improving, otherwise strict return criteria to go to ED - Future consider Chest X-ray if indicated - Recommend self quarantine for now until symptoms improve, patient is high risk for comorbid problem if she were to be exposed to [place] - Discussion on recommendations IF DEVELOP FEVER or any concern for COVID19 needs to contact by phone first and then may warrant E-Visit or remote testing.   Meds ordered this encounter  Medications  . levofloxacin (LEVAQUIN) 500 MG tablet    Sig: Take 1 tablet (500 mg total) by mouth daily. For 7 days    Dispense:  7 tablet    Refill:  0  . predniSONE (DELTASONE) 50 MG tablet    Sig: Take 1 tablet (50 mg total) by mouth daily with  breakfast.    Dispense:  5 tablet    Refill:  0  . chlorpheniramine-HYDROcodone (TUSSIONEX PENNKINETIC ER) 10-8 MG/5ML SUER    Sig: Take 5 mLs by mouth 3 (three) times daily as needed for cough.    Dispense:  140 mL    Refill:  0     Follow up plan: Return in  about 1 week (around 03/09/2019), or if symptoms worsen or fail to improve, for COPD.   [name], [facility] 03/02/2019, 11:24 AM

## 2019-03-02 NOTE — Telephone Encounter (Signed)
Dr. Roosvelt Harps office had never received paperwork form [name], advised [name] to follow up for this referral.

## 2019-03-02 NOTE — Telephone Encounter (Signed)
Spoke with patient and let her know that we will place her on our list to recall once cleared from concerns regarding the virus. She verbalized understanding with no further questions at this time.

## 2019-03-02 NOTE — Telephone Encounter (Signed)
Call to patient for prescreening prior to OV.   She reports ongoing syncopal episodes and will need to continue with scheduled appt today.   Prescreening for Covid 19  1. No contact w sick people 2. Always have cough, copd episodes vomiting after syncope 3. No international travel in the past month   Pt made aware of our current visitors policy and procedures.   Pt agreeable.

## 2019-03-02 NOTE — Telephone Encounter (Signed)
Pt forgot to mention she needs a referral to Dr. Evorn Gong.  Her call back [phone]

## 2019-03-02 NOTE — Patient Instructions (Addendum)
Thank you for coming to the office today.   1. It sounds like you had an Upper Respiratory Virus that has settled into a Bronchitis, lower respiratory tract infection. I don't have concerns for pneumonia today, and think that this should gradually improve. Once you are feeling better, the cough may take a few weeks to fully resolve. I do hear wheezing and coarse breath sounds, this may be due to the virus, also could be related to smoking.  Start taking Levaquin antibiotic 500mg  daily x 7 days  Start Prednisone 50mg  daily for next 5 days - this will open up lungs allow you to breath better and treat that wheezing or bronchospasm  - Drink plenty of fluids to improve congestion  If your symptoms seem to worsen instead of improve over next several days, including significant fever / chills, worsening shortness of breath, worsening wheezing, or nausea / vomiting and can't take medicines - return sooner or go to hospital Emergency Department for more immediate treatment.   Please schedule a Follow-up Appointment to: Return in about 1 week (around 03/09/2019), or if symptoms worsen or fail to improve, for COPD.  If you have any other questions or concerns, please feel free to call the office or send a message through [place]. You may also schedule an earlier appointment if necessary.  Additionally, you may be receiving a survey about your experience at our office within a few days to 1 week by e-mail or mail. We value your feedback.  [name], DO [place]

## 2019-03-08 ENCOUNTER — Telehealth: Payer: Self-pay | Admitting: Family Medicine

## 2019-03-08 DIAGNOSIS — J431 Panlobular emphysema: Secondary | ICD-10-CM

## 2019-03-08 DIAGNOSIS — J441 Chronic obstructive pulmonary disease with (acute) exacerbation: Secondary | ICD-10-CM

## 2019-03-08 MED ORDER — PREDNISONE 50 MG PO TABS: 50.0000 mg | ORAL_TABLET | Freq: Every day | ORAL | 0 refills | Status: DC

## 2019-03-08 MED ORDER — DOXYCYCLINE HYCLATE 100 MG PO TABS: 100.0000 mg | ORAL_TABLET | Freq: Two times a day (BID) | ORAL | 0 refills | Status: DC

## 2019-03-08 NOTE — Telephone Encounter (Signed)
Pt is feeling better but thinks she needs another round of levaquin and prednisone to get over COPD [phone]

## 2019-03-08 NOTE — Telephone Encounter (Signed)
Last visit 03/02/19 for AECOPD. See note for full documentation. Since it is < 7 days from last E&M coded office visit - we did not perform a full telephonic medical office visit today. Provided her brief phone follow-up - her symptoms significantly improved, but after finish meds still has persistent thicker green sputum and cough congestion, and some wheezing, she requested a repeat round of different antibiotic in past has required doxycycline.  Advised her that typically I prefer to wait a few days in between, she should wait 24-72 hours to see if symptoms still resolve or improve or if not improving at all or worse she can start therapy - given limited access at this time due to [place] concerns we will agree to rx these medications for her and she can pick up and use only if needed  Return criteria reviewed if worse fever, chills, sweats, shortness of breath or new concerns.  [name], [facility] 03/08/2019, 3:23 PM

## 2019-03-09 ENCOUNTER — Telehealth: Payer: Self-pay

## 2019-03-09 NOTE — Telephone Encounter (Signed)
-----   Message from Theora Gianotti, NP sent at 03/08/2019 11:25 AM EDT ----- Lots of baseline artifact, limiting data, but overall a reassuring study.  No significant arrhythmias.  Rare extra beats from the top and bottom chambers and two very brief (up to 5 beats) of atrial tachycardia/SVT.  Overall pretty similar to prior holter findings.  Cont dilt @ current dose.

## 2019-03-09 NOTE — Telephone Encounter (Signed)
Call to patient to discuss results from cardiac event monitor. Pt verbalized understanding and had no further questions at this time.   Advised pt to call for any further questions or concerns.

## 2019-03-15 NOTE — Telephone Encounter (Signed)
LMOV to schedule possible evisit  °

## 2019-03-16 DIAGNOSIS — M898X1 Other specified disorders of bone, shoulder: Secondary | ICD-10-CM | POA: Diagnosis not present

## 2019-03-16 DIAGNOSIS — M7581 Other shoulder lesions, right shoulder: Secondary | ICD-10-CM | POA: Diagnosis not present

## 2019-03-16 DIAGNOSIS — M79621 Pain in right upper arm: Secondary | ICD-10-CM | POA: Diagnosis not present

## 2019-03-25 NOTE — Telephone Encounter (Signed)
°  LMOV to schedule evisit   °  ° ° °

## 2019-03-25 NOTE — Telephone Encounter (Signed)
Spoke with patient. She is fine with doing a [place] virtual visit. Consent sent to her MyChart. Awaiting response.

## 2019-03-26 NOTE — Progress Notes (Signed)
Virtual Visit via Video Note   This visit type was conducted due to national recommendations for restrictions regarding the COVID-19 Pandemic (e.g. social distancing) in an effort to limit this patient's exposure and mitigate transmission in our community.  Due to her co-morbid illnesses, this patient is at least at moderate risk for complications without adequate follow up.  This format is felt to be most appropriate for this patient at this time.  All issues noted in this document were discussed and addressed.  A limited physical exam was performed with this format.  Verbal consent was obtained from the patient.  Evaluation Performed:  Follow-up visit  Date:  03/29/2019   ID:  Caroline, Conley 08-Jul-1966, MRN [id number]  Patient Location: Home  Provider Location: Office  PCP:  Olin Hauser, DO  Cardiologist:  [name], MD Electrophysiologist:  None   Chief Complaint:  Vertigo  History of Present Illness:    Caroline Conley is a 53 y.o. female who presents via audio/video conferencing for a telehealth visit today.  She has a history of tachycardia/palpitations, atypical chest pain with clean coronaries by [facility] in 2015, COPD, prior tobacco use, and basal cell carcinoma.  We are speaking today for follow-up of her palpitations and chest pain.  She was last seen in our office by [name], NP in January.  Preceding stress test was unrevealing.  She continued to complain of occasional chest tightness and chronic dyspnea and wheezing.  She also reported an episode of lightheadedness with syncope while walking in the kitchen.  Due to mild orthostatic hypotension at the time of her visit, diltiazem was decreased and adequate hydration encouraged.  14-day event monitor showed predominantly sinus rhythm with PACs and PVCs as well as 2 brief atrial runs, though study was limited by significant artifact.  Today, Caroline Conley reports that she has been very dizzy over the last 2-2.5  weeks.  It began while she was bending over in her camper.  When she stood up, it felt like things were moving around her.  She also had nausea and threw up twice.  Dizziness has recurred and led her to visit an urgent care, where she was prescribe meclizine.  This has helped somewhat, though she continues to have intermittent dizziness during the day.  She has not passed out but has stumbled and fallen a couple of times.  Caroline Conley also notes increased shortness of breath over the last 1-2 months.  She has been treaded with 2 courses of antibiotics as well as prednisone for COPD flares.  Though breathing is slowly improving, it is not back to her baseline.  She denies chest pain, other than soreness with coughing during the worst part of her COPD exacerbation.  She denies orthopnea, PND, and edema, though her heart races after using her nebulizer.  The patient does not have symptoms concerning for COVID-19 infection (fever, chills, cough, or new shortness of breath).    Past Medical History:  Diagnosis Date  . Atypical chest pain    a. 2015 Cath ([facility]): Nl cors. Nl EF; b. 10/2018 MV: No ischemia/infarct. EF>65%.  . Cancer ([place]) 01/2016   basal cell   . Emphysema of lung ([place])   . Palpitations    a. 07/2018 Echo: EF 60-65%, no rwma, Gr1 DD, Nl RV fxn; b. 07/2018 48h Holter: predominantly RSR, avg rate 106 (69-166 bpm), Rare PAC's and occas PVC's. 2 atrial runs up to 5 beats. No sustained arrhythmias or prolonged  pauses. No symptoms reported.  . Wears dentures    full upper and lower   Past Surgical History:  Procedure Laterality Date  . ABDOMINAL HYSTERECTOMY    . APPENDECTOMY    . BACK SURGERY     this will make third time  . CARDIAC CATHETERIZATION  09/20/14   [facility] - Dr. Clayborn Bigness  . COLONOSCOPY WITH PROPOFOL N/A 07/08/2016   Procedure: COLONOSCOPY WITH PROPOFOL;  Surgeon: Lucilla Lame, MD;  Location: [facility];  Service: Endoscopy;  Laterality: N/A;  . DEBRIDEMENT TENNIS ELBOW     . METATARSAL OSTEOTOMY  05/18/2013  . ROTATOR CUFF REPAIR    . TONSILLECTOMY       Current Meds  Medication Sig  . budesonide (PULMICORT) 0.5 MG/2ML nebulizer solution Take 2 mLs (0.5 mg total) by nebulization 2 (two) times daily. (Patient taking differently: Take 0.5 mg by nebulization 4 (four) times daily. )  . budesonide-formoterol (SYMBICORT) 160-4.5 MCG/ACT inhaler Inhale 2 puffs into the lungs 2 (two) times daily.  Marland Kitchen diltiazem (DILACOR XR) 120 MG 24 hr capsule Take 1 capsule (120 mg total) by mouth daily.  . fluconazole (DIFLUCAN) 200 MG tablet TAKE 1 TABLET BY MOUTH ONCE DAILY AS NEEDED YEAST MAY REPEAT IN 48 HRS  . magic mouthwash w/lidocaine SOLN Swish, gargle, and spit one to two teaspoonfuls every six hours as needed. Shake well before using.  . meclizine (ANTIVERT) 12.5 MG tablet Take 12.5 mg by mouth daily.   Marland Kitchen oxyCODONE-acetaminophen (PERCOCET) 10-325 MG tablet oxycodone-acetaminophen 10 mg-325 mg tablet  1 QIDPRN Fill on 07/30/2017  . SPIRIVA RESPIMAT 2.5 MCG/ACT AERS   . VENTOLIN HFA 108 (90 Base) MCG/ACT inhaler INHALE 2 PUFFS BY MOUTH EVERY 6 HOURS ASNEEDED WHEEZING/ SHORTNESS OFBREATH     Allergies:   2,4-d dimethylamine (amisol); Morphine and related; and Vicodin [hydrocodone-acetaminophen]   Social History   Tobacco Use  . Smoking status: Former Smoker    Packs/day: 0.25    Years: 30.00    Pack years: 7.50    Types: Cigarettes    Last attempt to quit: 03/02/2018    Years since quitting: 1.0  . Smokeless tobacco: Former Systems developer  . Tobacco comment: 0.5 to 1 ppd >30 years  Substance Use Topics  . Alcohol use: No  . Drug use: No     Family Hx: The patient's family history includes Arthritis/Rheumatoid in her father; Cancer in her father; Diabetes in her brother, father, and mother; Gout in her mother; Heart failure in her brother and father; High blood pressure in her father and mother; Stroke in her father.  ROS:   Please see the history of present illness.    All other systems reviewed and are negative.   Prior CV studies:   The following studies were reviewed today:  14-day event monitor (03/04/2019): Limited study due to significant artifact.  Usable tracings show predominantly sinus rhythm with rare PAC's and PVC's.  Two brief atrial runs noted (up to 5 beats).  Pharmacologic MPI (10/30/2018): Low risk study without ischemia or scar.  LVEF greater than 65%.  48-hour Holter monitor (08/06/2018): Predominantly sinus rhythm with rare PAC's and PVC's, as well as brief atrial runs.  Average rate during the monitoring period was elevated (106 bpm).  TTE (08/04/2018): Normal LV size and wall thickness.  LVEF 60-65% with normal wall motion.  Grade 1 diastolic dysfunction.  Normal RV size and function.  PA pressure unable to be determined.  Labs/Other Tests and Data Reviewed:  EKG:  No ECG reviewed.  Recent Labs: 05/22/2018: ALT 14 10/09/2018: Hemoglobin 13.1; Platelets 324 12/31/2018: BUN 11; Creat 1.16; Magnesium 1.8; Potassium 4.3; Sodium 134   Recent Lipid Panel Lab Results  Component Value Date/Time   CHOL 169 07/29/2017 12:29 PM   TRIG 243 (H) 07/29/2017 12:29 PM   HDL 35 (L) 07/29/2017 12:29 PM   CHOLHDL 4.8 07/29/2017 12:29 PM   LDLCALC 85 07/29/2017 12:29 PM    Wt Readings from Last 3 Encounters:  03/29/19 137 lb (62.1 kg)  03/02/19 135 lb (61.2 kg)  01/12/19 135 lb (61.2 kg)     Objective:    Vital Signs:  Ht 5\' 6"  (1.676 m)   Wt 137 lb (62.1 kg)   BMI 22.11 kg/m    Well nourished, well developed female in no acute distress.  ASSESSMENT & PLAN:    Vertigo: Dizziness with accomopanying balance issues and nausea are most consistent with vertigo.  I agree with continued use of meclizine.  If dizziness does not improve, she should contact her PCP for further evaluation.  COPD: Recent exacerbations treated with antibiotics and prednisone.  Though breathing is slowly improving, it is not back to baseline.  Based on prior  testing and her description of the symptoms, underlying cardiac cause for her shortness of breath is felt to be less likely.  Continue follow-up with PCP and pulmonary.  Palpitations: Well-controlled, other than elevated heart rates when using nebulizer.  We will continue with low-dose diltiazem, which Ms. Leonardo is tolerating well.  COVID-19 Education: The signs and symptoms of COVID-19 were discussed with the patient and how to seek care for testing (follow up with PCP or arrange E-visit).  The importance of social distancing was discussed today.  Time:   Today, I have spent 13 minutes with the patient with telehealth technology discussing the above problems.     Medication Adjustments/Labs and Tests Ordered: Current medicines are reviewed at length with the patient today.  Concerns regarding medicines are outlined above.   Tests Ordered: None.  Medication Changes: None.  Disposition:  Follow up in 6 month(s)  Signed, [name], MD  03/29/2019 3:33 PM    [facility]

## 2019-03-29 ENCOUNTER — Encounter: Payer: Self-pay | Admitting: Internal Medicine

## 2019-03-29 ENCOUNTER — Telehealth (INDEPENDENT_AMBULATORY_CARE_PROVIDER_SITE_OTHER): Payer: Medicaid Other | Admitting: Internal Medicine

## 2019-03-29 ENCOUNTER — Other Ambulatory Visit: Payer: Self-pay

## 2019-03-29 VITALS — Ht 66.0 in | Wt 137.0 lb

## 2019-03-29 DIAGNOSIS — R002 Palpitations: Secondary | ICD-10-CM | POA: Diagnosis not present

## 2019-03-29 DIAGNOSIS — R42 Dizziness and giddiness: Secondary | ICD-10-CM

## 2019-03-29 DIAGNOSIS — J449 Chronic obstructive pulmonary disease, unspecified: Secondary | ICD-10-CM

## 2019-03-29 NOTE — Patient Instructions (Signed)
Medication Instructions:  Your physician recommends that you continue on your current medications as directed. Please refer to the Current Medication list given to you today.  If you need a refill on your cardiac medications before your next appointment, please call your pharmacy.   Lab work: none If you have labs (blood work) drawn today and your tests are completely normal, you will receive your results only by: Marland Kitchen MyChart Message (if you have MyChart) OR . A paper copy in the mail If you have any lab test that is abnormal or we need to change your treatment, we will call you to review the results.  Testing/Procedures: none  Follow-Up: At [facility], you and your health needs are our priority.  As part of our continuing mission to provide you with exceptional heart care, we have created designated Provider Care Teams.  These Care Teams include your primary Cardiologist (physician) and Advanced Practice Providers (APPs -  Physician Assistants and Nurse Practitioners) who all work together to provide you with the care you need, when you need it. You will need a follow up appointment in 6 months.  Please call our office 2 months in advance to schedule this appointment.  You may see [name], MD or one of the following Advanced Practice Providers on your designated Care Team:   Murray Hodgkins, NP Christell Faith, PA-C . Marrianne Mood, PA-C  Any Other Special Instructions Will Be Listed Below (If Applicable).  If you dizziness or shortness of breath get worse, please contact your Primary Care Provider or Pulmonologist.

## 2019-03-30 ENCOUNTER — Ambulatory Visit (INDEPENDENT_AMBULATORY_CARE_PROVIDER_SITE_OTHER): Payer: Medicaid Other | Admitting: Family Medicine

## 2019-03-30 ENCOUNTER — Encounter: Payer: Self-pay | Admitting: Family Medicine

## 2019-03-30 ENCOUNTER — Other Ambulatory Visit: Payer: Self-pay

## 2019-03-30 DIAGNOSIS — R42 Dizziness and giddiness: Secondary | ICD-10-CM | POA: Insufficient documentation

## 2019-03-30 DIAGNOSIS — H8113 Benign paroxysmal vertigo, bilateral: Secondary | ICD-10-CM | POA: Diagnosis not present

## 2019-03-30 DIAGNOSIS — J441 Chronic obstructive pulmonary disease with (acute) exacerbation: Secondary | ICD-10-CM | POA: Insufficient documentation

## 2019-03-30 DIAGNOSIS — J449 Chronic obstructive pulmonary disease, unspecified: Secondary | ICD-10-CM | POA: Insufficient documentation

## 2019-03-30 NOTE — Patient Instructions (Addendum)
1. You have symptoms of Vertigo (Benign Paroxysmal Positional Vertigo) - This is commonly caused by inner ear fluid imbalance, sometimes can be worsened by allergies and sinus symptoms, otherwise it can occur randomly sometimes and we may never discover the exact cause. - To treat this, try the Epley Manuever (see diagrams/instructions below) at home up to 3 times a day for 1-2 weeks or until symptoms resolve - You may take Meclizine as needed up to 3 times a day for dizziness, this will not cure symptoms but may help. Caution may make you drowsy.  If you develop significant worsening episode with vertigo that does not improve and you get severe headache, loss of vision, arm or leg weakness, slurred speech, or other concerning symptoms please seek immediate medical attention at Emergency Department.  Please schedule a follow-up appointment with Dr Parks Ranger within 4 weeks if Vertigo not improving, and will consider Referral to Vestibular Rehab  See the next page for images describing the Epley Manuever.     ----------------------------------------------------------------------------------------------------------------------         If you have any other questions or concerns, please feel free to call the office or send a message through [place]. You may also schedule an earlier appointment if necessary.  Additionally, you may be receiving a survey about your experience at our office within a few days to 1 week by e-mail or mail. We value your feedback.  [name], DO [facility]

## 2019-03-30 NOTE — Progress Notes (Signed)
Virtual Visit via Telephone The purpose of this virtual visit is to provide medical care while limiting exposure to the novel coronavirus (COVID19) for both patient and office staff.  Consent was obtained for phone visit:  Yes.   Answered questions that patient had about telehealth interaction:  Yes.   I discussed the limitations, risks, security and privacy concerns of performing an evaluation and management service by telephone. I also discussed with the patient that there may be a patient responsible charge related to this service. The patient expressed understanding and agreed to proceed.  Patient Location: Home Provider Location: [facility])  ---------------------------------------------------------------------- Chief Complaint  Patient presents with  . Dizziness    onset today throwing up, cardiologist prefers that patient call PCP     S: Reviewed CMA documentation. I have called patient and gathered additional HPI as follows:  VERTIGO Recent history, with episodic vertigo acute onset back about 1 month ago, onset in March 2020, she went to [facility] and diagnosed treated with Vertigo with meclizine 12.5mg  PRN. She has discussed this as well with her Cardiology recently during a telephone visit yesterday Dr End, they recommended that she contact us for further advice. - Today reports that symptoms have not resolved or significantly improved over past 1 month, seems that she will have a few days of improved symptoms and no symptoms at all and then will get waves of worsening episodic vertigo spinning off balance, dizzy spells worse with position change and also turning head or bending forward, acute onset, unable to tell if R or L side worse, no prior episodes similar - Admits associated fatigue, occasional episodic nausea vomiting only acutely then resolved - Denies any focal neurological deficits numbness tingling weakness,   Denies any high risk  travel to areas of current concern for COVID19. Denies any known or suspected exposure to person with or possibly with COVID19.  Denies any fevers, chills, sweats, body ache, cough, shortness of breath, sinus pain or pressure, headache, abdominal pain, diarrhea  Past Medical History:  Diagnosis Date  . Atypical chest pain    a. 2015 Cath ([place]): Nl cors. Nl EF; b. 10/2018 MV: No ischemia/infarct. EF>65%.  . Cancer ([place]) 01/2016   basal cell   . Emphysema of lung ([place])   . Palpitations    a. 07/2018 Echo: EF 60-65%, no rwma, Gr1 DD, Nl RV fxn; b. 07/2018 48h Holter: predominantly RSR, avg rate 106 (69-166 bpm), Rare PAC's and occas PVC's. 2 atrial runs up to 5 beats. No sustained arrhythmias or prolonged pauses. No symptoms reported.  . Wears dentures    full upper and lower   Social History   Tobacco Use  . Smoking status: Former Smoker    Packs/day: 0.25    Years: 30.00    Pack years: 7.50    Types: Cigarettes    Last attempt to quit: 03/02/2018    Years since quitting: 1.0  . Smokeless tobacco: Former Systems developer  . Tobacco comment: 0.5 to 1 ppd >30 years  Substance Use Topics  . Alcohol use: No  . Drug use: No    Current Outpatient Medications:  .  budesonide (PULMICORT) 0.5 MG/2ML nebulizer solution, Take 2 mLs (0.5 mg total) by nebulization 2 (two) times daily. (Patient taking differently: Take 0.5 mg by nebulization 4 (four) times daily. ), Disp: 120 mL, Rfl: 11 .  budesonide-formoterol (SYMBICORT) 160-4.5 MCG/ACT inhaler, Inhale 2 puffs into the lungs 2 (two) times daily., Disp: 1 Inhaler,  Rfl: 6 .  diltiazem (DILACOR XR) 120 MG 24 hr capsule, Take 1 capsule (120 mg total) by mouth daily., Disp: 90 capsule, Rfl: 3 .  fluconazole (DIFLUCAN) 200 MG tablet, TAKE 1 TABLET BY MOUTH ONCE DAILY AS NEEDED YEAST MAY REPEAT IN 48 HRS, Disp: 5 tablet, Rfl: 2 .  magic mouthwash w/lidocaine SOLN, Swish, gargle, and spit one to two teaspoonfuls every six hours as needed. Shake well before  using., Disp: 120 mL, Rfl: 0 .  meclizine (ANTIVERT) 12.5 MG tablet, Take 12.5 mg by mouth daily. , Disp: , Rfl:  .  oxyCODONE-acetaminophen (PERCOCET) 10-325 MG tablet, oxycodone-acetaminophen 10 mg-325 mg tablet  1 QIDPRN Fill on 07/30/2017, Disp: , Rfl:  .  SPIRIVA RESPIMAT 2.5 MCG/ACT AERS, , Disp: , Rfl:  .  VENTOLIN HFA 108 (90 Base) MCG/ACT inhaler, INHALE 2 PUFFS BY MOUTH EVERY 6 HOURS ASNEEDED WHEEZING/ SHORTNESS OFBREATH, Disp: 18 g, Rfl: 3  Depression screen [facility] 2/9 03/30/2019 03/02/2019 12/30/2018  Decreased Interest 0 0 0  Down, Depressed, Hopeless 0 0 0  PHQ - 2 Score 0 0 0    No flowsheet data found.  -------------------------------------------------------------------------- O: No physical exam performed due to remote telephone encounter.   Recent Results (from the past 2160 hour(s))  Magnesium     Status: None   Collection Time: 12/31/18  9:29 AM  Result Value Ref Range   Magnesium 1.8 1.5 - 2.5 mg/dL  Basic Metabolic Panel (BMET)     Status: Abnormal   Collection Time: 12/31/18  9:29 AM  Result Value Ref Range   Glucose, Bld 109 (H) 65 - 99 mg/dL    Comment: .            Fasting reference interval . For someone without known diabetes, a glucose value between 100 and 125 mg/dL is consistent with prediabetes and should be confirmed with a follow-up test. .    BUN 11 7 - 25 mg/dL   Creat 1.16 (H) 0.50 - 1.05 mg/dL    Comment: For patients >54 years of age, the reference limit for Creatinine is approximately 13% higher for people identified as African-American. .    BUN/Creatinine Ratio 9 6 - 22 (calc)   Sodium 134 (L) 135 - 146 mmol/L   Potassium 4.3 3.5 - 5.3 mmol/L   Chloride 100 98 - 110 mmol/L   CO2 27 20 - 32 mmol/L   Calcium 9.6 8.6 - 10.4 mg/dL    -------------------------------------------------------------------------- A&P:  Problem List Items Addressed This Visit    None    Visit Diagnoses    BPPV (benign paroxysmal positional vertigo),  bilateral    -  Primary     Suspected bilateral vs unspecified BPPV by history supported - No other significant neurological findings or focal deficits  Plan: 1. AVS available - Handout given with Epley maneuver TID for 1-2 weeks until resolved - do bilateral 2. Use existing meclizine PRN for breakthrough symptoms if need only - otherwise can stop - Offer anti-emetic PRN but declined, since rare nausea symptoms - Improve hydration 3. Return criteria, if not improved consider vestibular PT referral vs ENT   No orders of the defined types were placed in this encounter.   Follow-up: - Return in 1-2 weeks as needed if vertigo not improved  Patient verbalizes understanding with the above medical recommendations including the limitation of remote medical advice.  Specific follow-up and call-back criteria were given for patient to follow-up or seek medical care more urgently if  needed.   - Time spent in direct consultation with patient on phone: 11 minutes  [name], [facility] 03/30/2019, 2:28 PM

## 2019-04-15 ENCOUNTER — Ambulatory Visit (INDEPENDENT_AMBULATORY_CARE_PROVIDER_SITE_OTHER): Payer: Medicaid Other | Admitting: Internal Medicine

## 2019-04-15 ENCOUNTER — Other Ambulatory Visit: Payer: Self-pay | Admitting: Family Medicine

## 2019-04-15 ENCOUNTER — Encounter: Payer: Self-pay | Admitting: Internal Medicine

## 2019-04-15 DIAGNOSIS — J431 Panlobular emphysema: Secondary | ICD-10-CM

## 2019-04-15 DIAGNOSIS — J441 Chronic obstructive pulmonary disease with (acute) exacerbation: Secondary | ICD-10-CM

## 2019-04-15 MED ORDER — AMOXICILLIN-POT CLAVULANATE 875-125 MG PO TABS: 1.0000 | ORAL_TABLET | Freq: Two times a day (BID) | ORAL | 0 refills | Status: AC

## 2019-04-15 MED ORDER — PREDNISONE 20 MG PO TABS: 40.0000 mg | ORAL_TABLET | Freq: Every day | ORAL | 1 refills | Status: DC

## 2019-04-15 MED ORDER — HYDROCOD POLST-CPM POLST ER 10-8 MG/5ML PO SUER: 5.0000 mL | Freq: Two times a day (BID) | ORAL | 0 refills | Status: DC | PRN

## 2019-04-15 NOTE — Progress Notes (Signed)
Name: Caroline Conley MRN: [id number] DOB: February 24, 1966    CONSULTATION DATE:  04/15/2019   REFERRING MD : [name]    VIDEO/TELEPHONE VISIT    In the setting of the current Covid19 crisis, you are scheduled for a  visit with me on 04/15/2019  Just as we do with many in-office visits, in order for you to participate in this visit, we must obtain consent.   I can obtain your verbal consent now.  PATIENT AGREES AND CONFIRMS -YES This Visit has Audio and Visual Capabilities for optimal patient care experience   Evaluation Performed:  Follow-up visit  This visit type was conducted due to national recommendations for restrictions regarding the COVID-19 Pandemic (e.g. social distancing).  This format is felt to be most appropriate for this patient at this time.  All issues noted in this document were discussed and addressed.      Virtual Visit Via VIDEO  I connected with patient on 04/15/2019 by Video and verified that I am speaking with the correct person using two identifiers.   I discussed the limitations, risks, security and privacy concerns of performing an evaluation and management service by telephone and the availability of in person appointments. I also discussed with the patient that there may be a patient responsible charge related to this service. The patient expressed understanding and agreed to proceed.   Location of the patient: Home Location of provider: Home Participating persons: Patient and provider only  Previous PFT 2017 ratio 18%, Fev1 2.2L 74% fef25/75 0.78L 22%-moderate obstructive airways disease CT of the chest reviewed with patient and family at last OV  there is no lung masses noted however there is areas of emphysema in the upper lobe region bilaterally  CC: follow up COPD    HISTORY OF PRESENT ILLNESS: Progressive SOB Ongoing wheezing +fevers +productive cough  Exposed to ongoing second hand smoke    Progressive symptoms despite being on  Symbicort  +COPD exacerbation at this time      Allergies  Allergen Reactions  . 2,4-D Dimethylamine (Amisol) Itching    (Pt unsure of this allergy)  . Morphine And Related Itching  . Vicodin [Hydrocodone-Acetaminophen] Nausea And Vomiting    GI distress     Review of Systems:  Gen:  Denies  fever, sweats, chills weigh loss  HEENT: Denies blurred vision, double vision, ear pain, eye pain, hearing loss, nose bleeds, sore throat Cardiac:  No dizziness, chest pain or heaviness, chest tightness,edema, No JVD Resp:   + cough, +sputum production, +shortness of breath,+wheezing, -hemoptysis,  Gi: Denies swallowing difficulty, stomach pain, nausea or vomiting, diarrhea, constipation, bowel incontinence Gu:  Denies bladder incontinence, burning urine Ext:   Denies Joint pain, stiffness or swelling Skin: Denies  skin rash, easy bruising or bleeding or hives Endoc:  Denies polyuria, polydipsia , polyphagia or weight change Psych:   Denies depression, insomnia or hallucinations  Other:  All other systems negative    Physical Examination:  Well nourished, well developed female in no acute distress. No Obvious Respiratory Distress noted EOMI intact, NO obvious oral lesions Facial Skin intact-no facial rash noted CN 3-12 intact   Insight, judgment intact. -depression -anxiety       ASSESSMENT AND PLAN  53 year old white female with moderate COPD Gold stage D With recurrent acute mild COPD exacerbation with constant exposure to secondhand smoke  COPD exacerbation Prednisone 40 mg daily for 10 days  Augmentin 875 BID for 10 days   Cough related to her COPD  exacerbation and wheezing Tussionex as needed-can not prescribe at this time    Emphysema based on CT scan findings From COPD and extensive smoking history Avoid secondhand smoke exposure   Deconditioned state -Recommend increased daily activity and exercise  Patient with recurrent COPD exacerbations Will need to  address PDE 4 inhibitor and next Will consider theophylline therapy and next visit      COVID-19 Education: The signs and symptoms of COVID-19 were discussed with the patient and how to seek care for testing (follow up with PCP or arrange E-visit).  The importance of social distancing was discussed today.   Patient satisfied with Plan of action and management. All questions answered  Corrin Parker, M.D.  [name]  Medical Director [place] Director [facility] Cardio-Pulmonary Department

## 2019-04-15 NOTE — Patient Instructions (Signed)
ABX and PREDNISONE for COPD EXACERBATION

## 2019-04-15 NOTE — Progress Notes (Signed)
Patient seen by Dr Mortimer Fries today pulmonology, see note, treated for COPD exacerbation, recommended Tussionex, but could not prescribe, patient asked to contact us. I will rx Tussionex as requested, sent to [facility], sent via E-script.  [name], [facility] 04/15/2019, 11:13 AM

## 2019-05-13 ENCOUNTER — Ambulatory Visit: Payer: Medicaid Other | Admitting: Internal Medicine

## 2019-05-17 ENCOUNTER — Other Ambulatory Visit: Payer: Self-pay | Admitting: Family Medicine

## 2019-05-17 ENCOUNTER — Other Ambulatory Visit: Payer: Self-pay | Admitting: Internal Medicine

## 2019-05-17 DIAGNOSIS — Z8709 Personal history of other diseases of the respiratory system: Secondary | ICD-10-CM | POA: Diagnosis not present

## 2019-05-17 DIAGNOSIS — B370 Candidal stomatitis: Secondary | ICD-10-CM

## 2019-05-17 DIAGNOSIS — R0602 Shortness of breath: Secondary | ICD-10-CM | POA: Diagnosis not present

## 2019-05-17 DIAGNOSIS — J40 Bronchitis, not specified as acute or chronic: Secondary | ICD-10-CM | POA: Diagnosis not present

## 2019-05-17 DIAGNOSIS — R05 Cough: Secondary | ICD-10-CM | POA: Diagnosis not present

## 2019-05-19 ENCOUNTER — Other Ambulatory Visit: Payer: Self-pay

## 2019-05-19 ENCOUNTER — Encounter: Payer: Self-pay | Admitting: Family Medicine

## 2019-05-19 ENCOUNTER — Ambulatory Visit (INDEPENDENT_AMBULATORY_CARE_PROVIDER_SITE_OTHER): Payer: Medicaid Other | Admitting: Family Medicine

## 2019-05-19 DIAGNOSIS — J441 Chronic obstructive pulmonary disease with (acute) exacerbation: Secondary | ICD-10-CM

## 2019-05-19 DIAGNOSIS — R42 Dizziness and giddiness: Secondary | ICD-10-CM

## 2019-05-19 DIAGNOSIS — J431 Panlobular emphysema: Secondary | ICD-10-CM | POA: Diagnosis not present

## 2019-05-19 MED ORDER — HYDROCOD POLST-CPM POLST ER 10-8 MG/5ML PO SUER: 5.0000 mL | Freq: Two times a day (BID) | ORAL | 0 refills | Status: DC | PRN

## 2019-05-19 MED ORDER — LEVOFLOXACIN 500 MG PO TABS: 500.0000 mg | ORAL_TABLET | Freq: Every day | ORAL | 0 refills | Status: DC

## 2019-05-19 MED ORDER — PREDNISONE 20 MG PO TABS: ORAL_TABLET | ORAL | 0 refills | Status: DC

## 2019-05-19 NOTE — Progress Notes (Signed)
Subjective:    Patient ID: Caroline Conley, female    DOB: 08/20/1966, 53 y.o.   MRN: [id number]  Caroline Conley is a 53 y.o. female presenting on 05/19/2019 for COPD (thick yellow mucus, fatigue onset week denies fever but has cough and SOB)  Virtual / Telehealth Encounter - Video Visit via D[employer]me The purpose of this virtual visit is to provide medical care while limiting exposure to the novel coronavirus (COVID19) for both patient and office staff.  Consent was obtained for remote visit:  Yes.   Answered questions that patient had about telehealth interaction:  Yes.   I discussed the limitations, risks, security and privacy concerns of performing an evaluation and management service by video/telephone. I also discussed with the patient that there may be a patient responsible charge related to this service. The patient expressed understanding and agreed to proceed.  Patient Location: Home Provider Location: [facility] (Office)   HPI  ACUTE COPD EXACERBATION / Panlobular Emphysema - Last visit for similar AECOPD flare 03/02/19 at our office and 04/15/19 at Dr Mortimer Fries office (pulmonology), last flare up treated with augmentin and prednisone. - interval update - no sick contacts, has been home quarantine and isolated. - Reports current onset symptoms started about 7 days ago. She has had worsening symptoms in past 2-3 days, she could not get acute apt with our office or pulm and she went to [facility], she had x-ray there that was negative for pneumonia but did show osteoporosis and DJD. She was given rx generic Augmentin, Prednisone 10mg  (20 pill taper), Benzonatate cough medication, Cough syrup  - she had side effect nausea on augmentin, stopped taking, benzonatate did not help, and allergic to the cough syrup - Worsening cough and dyspnea now - she thinks the maintenance therapy is less effective now, symbicort is less effective - She has apt with Dr Karilyn Cota on 6/17 Denies  myalgias, fever chills, hemoptysis, chest pain, sweats, nausea vomiting  History of Vertigo - Last treated 03/30/19, had some temporary relief then return of symptoms recently seemed to be provoked by camping trip, she usually goes to Dr Kathyrn Sheriff, requesting updated referral to return to him due to her insurance.   Depression screen [facility] 2/9 05/19/2019 03/30/2019 03/02/2019  Decreased Interest 0 0 0  Down, Depressed, Hopeless 0 0 0  PHQ - 2 Score 0 0 0    Social History   Tobacco Use  . Smoking status: Former Smoker    Packs/day: 0.25    Years: 30.00    Pack years: 7.50    Types: Cigarettes    Last attempt to quit: 03/02/2018    Years since quitting: 1.2  . Smokeless tobacco: Former Systems developer  . Tobacco comment: 0.5 to 1 ppd >30 years  Substance Use Topics  . Alcohol use: No  . Drug use: No    Review of Systems Per HPI unless specifically indicated above     Objective:    There were no vitals taken for this visit.  Wt Readings from Last 3 Encounters:  03/29/19 137 lb (62.1 kg)  03/02/19 135 lb (61.2 kg)  01/12/19 135 lb (61.2 kg)    Physical Exam   Note examination was completely remotely via video observation objective data only  Gen - well-appearing, no acute distress or apparent pain, comfortable HEENT - eyes appear clear without discharge or redness Heart/Lungs - cannot examine virtually - observed no evidence of coughing or labored breathing. Abd - cannot examine  virtually  Skin - face visible today- no rash Neuro - awake, alert, oriented Psych - not anxious appearing   Results for orders placed or performed in visit on 12/31/18  Magnesium  Result Value Ref Range   Magnesium 1.8 1.5 - 2.5 mg/dL  Basic Metabolic Panel (BMET)  Result Value Ref Range   Glucose, Bld 109 (H) 65 - 99 mg/dL   BUN 11 7 - 25 mg/dL   Creat 1.16 (H) 0.50 - 1.05 mg/dL   BUN/Creatinine Ratio 9 6 - 22 (calc)   Sodium 134 (L) 135 - 146 mmol/L   Potassium 4.3 3.5 - 5.3 mmol/L   Chloride 100  98 - 110 mmol/L   CO2 27 20 - 32 mmol/L   Calcium 9.6 8.6 - 10.4 mg/dL      Assessment & Plan:   Problem List Items Addressed This Visit    Chronic obstructive pulmonary disease (HCC) - Primary   Relevant Medications   levofloxacin (LEVAQUIN) 500 MG tablet   predniSONE (DELTASONE) 20 MG tablet   chlorpheniramine-HYDROcodone (TUSSIONEX PENNKINETIC ER) 10-8 MG/5ML SUER   Panlobular emphysema (HCC)   Relevant Medications   predniSONE (DELTASONE) 20 MG tablet   chlorpheniramine-HYDROcodone (TUSSIONEX PENNKINETIC ER) 10-8 MG/5ML SUER   Vertigo   Relevant Orders   Ambulatory referral to ENT      Clinically with acute COPD exacerbation, worsening productive cough and dyspnea Advanced COPD, seems to have persistent recurrent flares, recently about 3 flares in 3 months, improved with antibiotics / prednisone - Continues on maintenance therapy inhalers/nebs Followed by Dr Karilyn Cota  Plan - Switch antibiotic from augmentin (intolerance) over to Levaquin 500 x 7 days - add extended prednisone course 7 day taper instructions - Re order Tussionex syrup - Return to Pulm - Follow-up if not improving  Meds ordered this encounter  Medications  . levofloxacin (LEVAQUIN) 500 MG tablet    Sig: Take 1 tablet (500 mg total) by mouth daily. For 7 days    Dispense:  7 tablet    Refill:  0  . predniSONE (DELTASONE) 20 MG tablet    Sig: Take daily with food. Start with 60mg  (3 pills) x 2 days, then reduce to 40mg  (2 pills) x 2 days, then 20mg  (1 pill) x 3 days    Dispense:  13 tablet    Refill:  0  . chlorpheniramine-HYDROcodone (TUSSIONEX PENNKINETIC ER) 10-8 MG/5ML SUER    Sig: Take 5 mLs by mouth every 12 (twelve) hours as needed for cough.    Dispense:  140 mL    Refill:  0    Follow-up: - Return in 1 week as needed COPD  Patient verbalizes understanding with the above medical recommendations including the limitation of remote medical advice.  Specific follow-up and call-back criteria  were given for patient to follow-up or seek medical care more urgently if needed.  Total duration of direct patient care provided via video conference: 17 minutes  [name], [facility] 05/19/2019, 2:42 PM

## 2019-05-19 NOTE — Patient Instructions (Addendum)
Thank you for coming to the office today.  Referral to Dr Kathyrn Sheriff [facility] [place]) check with them next week for status of referral.  For COPD - ordered Levaquin and Prednisone taper as instructed  Continue breathing treatments.  Keep apt with Dr Mortimer Fries on 6/17 - discuss future other COPD medication options, it will likely be beneficial if your current treatment is not working as it should.  If not improving or worsening significantly seek care at hospital ED if needed.  Please schedule a Follow-up Appointment to: Return in about 1 week (around 05/26/2019), or if symptoms worsen or fail to improve, for COPD.  If you have any other questions or concerns, please feel free to call the office or send a message through [name]. You may also schedule an earlier appointment if necessary.  Additionally, you may be receiving a survey about your experience at our office within a few days to 1 week by e-mail or mail. We value your feedback.  [name], DO [place]

## 2019-05-21 DIAGNOSIS — M25512 Pain in left shoulder: Secondary | ICD-10-CM | POA: Diagnosis not present

## 2019-05-21 DIAGNOSIS — Z79899 Other long term (current) drug therapy: Secondary | ICD-10-CM | POA: Diagnosis not present

## 2019-05-21 DIAGNOSIS — M791 Myalgia, unspecified site: Secondary | ICD-10-CM | POA: Diagnosis not present

## 2019-05-21 DIAGNOSIS — M545 Low back pain: Secondary | ICD-10-CM | POA: Diagnosis not present

## 2019-05-27 DIAGNOSIS — J984 Other disorders of lung: Secondary | ICD-10-CM | POA: Diagnosis not present

## 2019-05-27 DIAGNOSIS — H903 Sensorineural hearing loss, bilateral: Secondary | ICD-10-CM | POA: Diagnosis not present

## 2019-05-27 DIAGNOSIS — R42 Dizziness and giddiness: Secondary | ICD-10-CM | POA: Diagnosis not present

## 2019-05-27 DIAGNOSIS — I519 Heart disease, unspecified: Secondary | ICD-10-CM | POA: Diagnosis not present

## 2019-05-28 DIAGNOSIS — S39012A Strain of muscle, fascia and tendon of lower back, initial encounter: Secondary | ICD-10-CM | POA: Diagnosis not present

## 2019-05-28 DIAGNOSIS — M5410 Radiculopathy, site unspecified: Secondary | ICD-10-CM | POA: Diagnosis not present

## 2019-05-28 DIAGNOSIS — M5431 Sciatica, right side: Secondary | ICD-10-CM | POA: Diagnosis not present

## 2019-06-02 ENCOUNTER — Ambulatory Visit (INDEPENDENT_AMBULATORY_CARE_PROVIDER_SITE_OTHER): Payer: Medicaid Other | Admitting: Internal Medicine

## 2019-06-02 ENCOUNTER — Encounter: Payer: Self-pay | Admitting: Internal Medicine

## 2019-06-02 DIAGNOSIS — J449 Chronic obstructive pulmonary disease, unspecified: Secondary | ICD-10-CM | POA: Diagnosis not present

## 2019-06-02 MED ORDER — SPIRIVA RESPIMAT 1.25 MCG/ACT IN AERS: 2.0000 | INHALATION_SPRAY | Freq: Two times a day (BID) | RESPIRATORY_TRACT | 5 refills | Status: DC

## 2019-06-02 MED ORDER — DALIRESP 500 MCG PO TABS: 500.0000 ug | ORAL_TABLET | Freq: Every day | ORAL | 3 refills | Status: DC

## 2019-06-02 MED ORDER — PREDNISONE 20 MG PO TABS: 40.0000 mg | ORAL_TABLET | Freq: Every day | ORAL | 1 refills | Status: DC

## 2019-06-02 NOTE — Progress Notes (Signed)
Name: Caroline Conley MRN: [id number] DOB: 01-11-1966    CONSULTATION DATE:  06/02/2019   REFERRING MD : Candise Bowens VISIT    In the setting of the current Covid19 crisis, you are scheduled for a  visit with me on 06/02/2019  Just as we do with many in-office visits, in order for you to participate in this visit, we must obtain consent.   I can obtain your verbal consent now.  PATIENT AGREES AND CONFIRMS -YES This Visit has Audio and Visual Capabilities for optimal patient care experience   Evaluation Performed:  Follow-up visit  This visit type was conducted due to national recommendations for restrictions regarding the COVID-19 Pandemic (e.g. social distancing).  This format is felt to be most appropriate for this patient at this time.  All issues noted in this document were discussed and addressed.       I connected with patient on 06/02/2019 by Video and verified that I am speaking with the correct person using two identifiers.   I discussed the limitations, risks, security and privacy concerns of performing an evaluation and management service by telephone and the availability of in person appointments. I also discussed with the patient that there may be a patient responsible charge related to this service. The patient expressed understanding and agreed to proceed.   Location of the patient: Home Location of provider: Office Participating persons: Patient and provider only  Previous PFT 2017 ratio 18%, Fev1 2.2L 74% fef25/75 0.78L 22%-moderate obstructive airways disease CT of the chest reviewed with patient and family at last OV  there is no lung masses noted however there is areas of emphysema in the upper lobe region bilaterally  CC: Follow up COPD    HISTORY OF PRESENT ILLNESS: +SOB +feeling BAD Ongoing wheezing  Exposed to second hand smoke On Symbicort  Several bouts of COPD exacerbation +weight loss +loss of appetite     Allergies   Allergen Reactions  . 2,4-D Dimethylamine (Amisol) Itching    (Pt unsure of this allergy)  . Morphine And Related Itching  . Vicodin [Hydrocodone-Acetaminophen] Nausea And Vomiting    GI distress      Review of Systems:  Gen:  Denies  fever, sweats, chills weigh loss  HEENT: Denies blurred vision, double vision, ear pain, eye pain, hearing loss, nose bleeds, sore throat Cardiac:  No dizziness, chest pain or heaviness, chest tightness,edema, No JVD Resp:  +cough, -sputum production, +shortness of breath,+wheezing, -hemoptysis,  Gi: Denies swallowing difficulty, stomach pain, nausea or vomiting, diarrhea, constipation, bowel incontinence Gu:  Denies bladder incontinence, burning urine Ext:   Denies Joint pain, stiffness or swelling Skin: Denies  skin rash, easy bruising or bleeding or hives Endoc:  Denies polyuria, polydipsia , polyphagia or weight change Psych:   Denies depression, insomnia or hallucinations  Other:  All other systems negative         ASSESSMENT AND PLAN  53 year old white female with moderate to severe COPD Gold stage D based on the fact that she has had multiple recurrent bouts of COPD exacerbation in the setting of constant exposure to secondhand smoke  COPD exacerbation 40 mg prednisone daily for 10 days Start Daliresp 500 mg daily Continue Symbicort Start Spiriva 1.25  Cough related to her COPD exacerbation and wheezing Recommend over-the-counter Robitussin-DM for cough suppression  Patient advised to avoid secondhand smoke exposure  Deconditioned state Recommend daily activity and exercise as tolerated   COVID-19 EDUCATION: The signs and symptoms of  COVID-19 were discussed with the patient and how to seek care for testing.  The importance of social distancing was discussed today. Hand Washing Techniques and avoid touching face was advised.  MEDICATION ADJUSTMENTS/LABS AND TESTS ORDERED: PREDNISONE 40 mg DAILY for 10 days Start DALIRESP  START SPIRIIVA 1.25  Obtain CT chest to assess for pneumonia and cancer  CURRENT MEDICATIONS REVIEWED AT LENGTH WITH PATIENT TODAY   Patient/Family are satisfied with Plan of action and management. All questions answered Follow up in 6 months  Total time spent 27 minutes  Corrin Parker, M.D.  [name]  Medical Director [place] Director [facility] Cardio-Pulmonary Department

## 2019-06-02 NOTE — Patient Instructions (Signed)
Start PREDNISONE 40 mg DAILY for 10 days Start DALIRESP Part SPIRIIVA 1.25  Obtain CT chest to assess for pneumonia and cancer

## 2019-06-07 ENCOUNTER — Ambulatory Visit: Payer: BC Managed Care – PPO | Attending: Physical Medicine and Rehabilitation | Admitting: Physical Therapy

## 2019-06-07 ENCOUNTER — Encounter: Payer: Self-pay | Admitting: Physical Therapy

## 2019-06-07 ENCOUNTER — Other Ambulatory Visit: Payer: Self-pay

## 2019-06-07 DIAGNOSIS — M6281 Muscle weakness (generalized): Secondary | ICD-10-CM | POA: Diagnosis not present

## 2019-06-07 DIAGNOSIS — R269 Unspecified abnormalities of gait and mobility: Secondary | ICD-10-CM

## 2019-06-07 DIAGNOSIS — M5441 Lumbago with sciatica, right side: Secondary | ICD-10-CM | POA: Insufficient documentation

## 2019-06-07 DIAGNOSIS — G8929 Other chronic pain: Secondary | ICD-10-CM | POA: Diagnosis not present

## 2019-06-07 NOTE — Patient Instructions (Signed)
Access Code: [email or web address]  URL: [email or web address]  Date: 06/07/2019  Prepared by: Dorcas Carrow   Exercises  Supine Piriformis Stretch with Leg Straight - 3 reps - 1 sets - 20 sec hold - 2x daily - 7x weekly  Supine Double Knee to Chest - 3 reps - 1 sets - 20 sec hold - 2x daily - 7x weekly  Supine ITB Stretch - 3 reps - 1 sets - 20 sec hold - 2x daily - 7x weekly

## 2019-06-08 NOTE — Therapy (Signed)
[facility] [address] [place], [place], [address] Phone: [phone]   Fax:  [phone]  Physical Therapy Evaluation  Patient Details  Name: Caroline Conley MRN: [id number] Date of Birth: 1965-12-19 Referring Provider (PT): Dr. Thereasa Parkin    Encounter Date: 06/07/2019  PT End of Session - 06/08/19 0744    Visit Number  1    Number of Visits  8    Date for PT Re-Evaluation  07/05/19    Authorization - Visit Number  1    Authorization - Number of Visits  10    PT Start Time  1404    PT Stop Time  1501    PT Time Calculation (min)  57 min    Activity Tolerance  Patient tolerated treatment well;Patient limited by pain    Behavior During Therapy  [facility] for tasks assessed/performed       Past Medical History:  Diagnosis Date  . Atypical chest pain    a. 2015 Cath ([place]): Nl cors. Nl EF; b. 10/2018 MV: No ischemia/infarct. EF>65%.  . Cancer ([place]) 01/2016   basal cell   . Emphysema of lung ([place])   . Palpitations    a. 07/2018 Echo: EF 60-65%, no rwma, Gr1 DD, Nl RV fxn; b. 07/2018 48h Holter: predominantly RSR, avg rate 106 (69-166 bpm), Rare PAC's and occas PVC's. 2 atrial runs up to 5 beats. No sustained arrhythmias or prolonged pauses. No symptoms reported.  . Wears dentures    full upper and lower    Past Surgical History:  Procedure Laterality Date  . ABDOMINAL HYSTERECTOMY    . APPENDECTOMY    . BACK SURGERY     this will make third time  . CARDIAC CATHETERIZATION  09/20/14   [facility] - Dr. Clayborn Bigness  . COLONOSCOPY WITH PROPOFOL N/A 07/08/2016   Procedure: COLONOSCOPY WITH PROPOFOL;  Surgeon: Lucilla Lame, MD;  Location: [facility];  Service: Endoscopy;  Laterality: N/A;  . DEBRIDEMENT TENNIS ELBOW    . METATARSAL OSTEOTOMY  05/18/2013  . ROTATOR CUFF REPAIR    . TONSILLECTOMY      There were no vitals filed for this visit.   Subjective Assessment - 06/07/19 1421    Subjective  Pt. reports 8/10 R low back  pain.  Pt. states 6/10 at best and 10/10 at worst.  Significant PMHx with low back and recent issues with L shoulder s/p repair.  Pt. has had PT for L shoulder and low back in past at varying PT clinics.    Pertinent History  Dr. Lorin Mercy repaired herniated disc in 2012- pt. states 6 weeks later the back was fused.  Pt. reports her back did well for several years and then while lifting some drinks at work she broke a screw and then the cage dropped resulting in another surgery.  Pt. using ice packs and changes position frequently.  Pt. is on Percocet to manage pain.  Pt. has been to pain mgmt. in past.  No working at this time.  Pt. enjoys going camping/ fishing (difficulty secondary to R shoulder issues).    Limitations  Standing;Walking;Lifting;House hold activities    Diagnostic tests  X-ray    Patient Stated Goals  Decrease low back pain.    Currently in Pain?  Yes    Pain Score  8     Pain Location  Back    Pain Orientation  Lower    Pain Descriptors / Indicators  Constant  Pain Type  Chronic pain    Pain Radiating Towards  R hip/leg         [facility] PT Assessment - 06/08/19 0001      Assessment   Medical Diagnosis  Lumbar radiculitis/ h/o L5-S1 fusion with stable XR    Referring Provider (PT)  Dr. Thereasa Parkin     Onset Date/Surgical Date  12/16/10    Hand Dominance  Right    Prior Therapy  yes, [facility]      [place] residence      Prior Function   Level of Independence  Independent         See HEP  Pt. Enjoys playing Pokemon Go which requires a lot of walking Increase low back pain with L/R rotn. R shoulder ROM significantly limited/ guarded during any PROM    PT Education - 06/07/19 1512    Education Details  See supine stretches.  Discuss modalites for home use    Person(s) Educated  Patient    Methods  Explanation;Demonstration;Handout    Comprehension  Verbalized understanding;Returned demonstration          PT Long  Term Goals - 06/08/19 1620      PT LONG TERM GOAL #1   Title  Pt. will increase FOTO to 47 to improve pain-free mobility.    Baseline  Initial FOTO: 37.    Time  4    Period  Weeks    Status  New    Target Date  07/05/19      PT LONG TERM GOAL #2   Title  Pt. independent with HEP to increase core/ LE muscle strength 1/2 muscle grade to improve household tasks.    Baseline  B LE muscle strength grossly 4/5 MMT except L hip flexion/ quad/ hamstring 4+/5 MMT.    Time  4    Period  Weeks    Status  New    Target Date  07/05/19      PT LONG TERM GOAL #3   Title  Pt. will report <6/10 low back pain at worst with lifting/ daily household tasks.    Baseline  Pt. reports 8/10 low back pain currently at rest, 6/10 at best and 10/10 at worst    Time  4    Period  Weeks    Status  New    Target Date  07/05/19      PT LONG TERM GOAL #4   Title  Pt. will report no R piriformis muscle tenderness to improve seated tolerance/ pain-free mobility.    Baseline  (+) R piriformis pain causing pain to lateral lean to L    Time  4    Period  Weeks    Status  New    Target Date  07/05/19         Plan - 06/08/19 0745    Clinical Impression Statement  Pt. is a pleasant 53 y/o female with chronic h/o low back pain with recert exacerbation of symptoms (no MOI).  Pt. has extensive PMHx/ low back surgeries with hardware in place.  Pt. reports 8/10 low back pain currently at rest with use of pain meds.  Pt. presents with good lumbar AROM WFL.  B hamstring flexibility 50 deg. (pain limited/ guarded).  No LLD.  Increase pain with lumbar rotn. in supine/ standing posture. B LE muscle strength grossly 4/5 MMT except L hip flexion/ quad/ hamstring 4+/5 MMT.  R shoulder has been limited  since RTC repair and PT at previous clinic.  Pt. presents with generalized lumbar paraspinal tenderness, esp. over R piriformis region.  Pt. ambulates with moderate R antalgic gait pattern esp. with initial steps after standing.   FOTO: initial 37/ goal 47.  Pt. will benefit from skilled PT services to increase pain-free lumbar ROM/ core stability to improve mobility.    Stability/Clinical Decision Making  Evolving/Moderate complexity    Clinical Decision Making  Moderate    Rehab Potential  Fair    PT Frequency  2x / week    PT Duration  4 weeks    PT Treatment/Interventions  ADLs/Self Care Home Management;Aquatic Therapy;Cryotherapy;Electrical Stimulation;Moist Heat;Gait training;Functional mobility training;Therapeutic activities;Therapeutic exercise;Balance training;Patient/family education;Neuromuscular re-education;Manual techniques;Dry needling;Passive range of motion    PT Next Visit Plan  Reassess lumbar AROM/ progress core stability/ lumbar MDT    PT Home Exercise Plan  See HEP       Patient will benefit from skilled therapeutic intervention in order to improve the following deficits and impairments:  Abnormal gait, Decreased endurance, Decreased mobility, Hypomobility, Improper body mechanics, Decreased range of motion, Decreased activity tolerance, Decreased strength, Impaired flexibility, Impaired UE functional use, Postural dysfunction, Pain  Visit Diagnosis: 1. Chronic bilateral low back pain with right-sided sciatica   2. Muscle weakness (generalized)   3. Gait difficulty        Problem List Patient Active Problem List   Diagnosis Date Noted  . Vertigo 03/30/2019  . Chronic obstructive pulmonary disease ([place]) 03/30/2019  . Panlobular emphysema ([place]) 03/02/2019  . Tachycardia 07/30/2018  . Palpitations 07/30/2018  . SOB (shortness of breath) 07/30/2018  . Full thickness rotator cuff tear 02/05/2018  . Shoulder joint pain 02/05/2018  . Chronic pain of both shoulders 01/01/2018  . Chronic bursitis of right shoulder 01/01/2018  . Frozen shoulder 01/01/2018  . Personal history of malignant melanoma 07/28/2017  . Hyperlipidemia 07/28/2017  . Elevated serum creatinine 07/28/2017  . HAV (hallux  abducto valgus) 08/02/2016  . Special screening for malignant neoplasms, colon   . Angular cheilitis 06/07/2016  . Tobacco abuse 06/07/2016  . Melanoma of buttock ([place]) 08/30/2015  . Acid indigestion 09/07/2014  . Dyspepsia 09/07/2014   Pura Spice, PT, DPT # [id number] 06/08/2019, 4:29 PM   [facility] [address] [place], [place], [address] Phone: [phone]   Fax:  [phone]  Name: Caroline Conley MRN: [id number] Date of Birth: 24-Jan-1966

## 2019-06-09 ENCOUNTER — Other Ambulatory Visit: Payer: Self-pay

## 2019-06-10 ENCOUNTER — Ambulatory Visit: Payer: BC Managed Care – PPO | Admitting: Physical Therapy

## 2019-06-14 ENCOUNTER — Ambulatory Visit: Payer: BC Managed Care – PPO | Admitting: Physical Therapy

## 2019-06-16 ENCOUNTER — Other Ambulatory Visit: Payer: Self-pay | Admitting: Internal Medicine

## 2019-06-17 ENCOUNTER — Ambulatory Visit: Payer: Medicaid Other | Attending: Physical Medicine and Rehabilitation | Admitting: Physical Therapy

## 2019-06-17 ENCOUNTER — Encounter: Payer: Self-pay | Admitting: Physical Therapy

## 2019-06-17 ENCOUNTER — Other Ambulatory Visit: Payer: Self-pay

## 2019-06-17 DIAGNOSIS — M6281 Muscle weakness (generalized): Secondary | ICD-10-CM

## 2019-06-17 DIAGNOSIS — M5441 Lumbago with sciatica, right side: Secondary | ICD-10-CM | POA: Diagnosis not present

## 2019-06-17 DIAGNOSIS — R269 Unspecified abnormalities of gait and mobility: Secondary | ICD-10-CM

## 2019-06-17 DIAGNOSIS — G8929 Other chronic pain: Secondary | ICD-10-CM | POA: Diagnosis not present

## 2019-06-17 NOTE — Therapy (Signed)
[facility] [address]. [place], [place], [address] Phone: [phone]   Fax:  [phone]  Physical Therapy Treatment  Patient Details  Name: Caroline Conley MRN: [id number] Date of Birth: Jun 07, 1966 Referring Provider (PT): Dr. Thereasa Parkin    Encounter Date: 06/17/2019  PT End of Session - 06/17/19 1308    Visit Number  2    Number of Visits  8    Date for PT Re-Evaluation  07/05/19    Authorization - Visit Number  2    Authorization - Number of Visits  10    PT Start Time  3845    PT Stop Time  1401    PT Time Calculation (min)  58 min    Activity Tolerance  Patient tolerated treatment well;Patient limited by pain    Behavior During Therapy  [facility] for tasks assessed/performed       Past Medical History:  Diagnosis Date  . Atypical chest pain    a. 2015 Cath ([place]): Nl cors. Nl EF; b. 10/2018 MV: No ischemia/infarct. EF>65%.  . Cancer ([place]) 01/2016   basal cell   . Emphysema of lung ([place])   . Palpitations    a. 07/2018 Echo: EF 60-65%, no rwma, Gr1 DD, Nl RV fxn; b. 07/2018 48h Holter: predominantly RSR, avg rate 106 (69-166 bpm), Rare PAC's and occas PVC's. 2 atrial runs up to 5 beats. No sustained arrhythmias or prolonged pauses. No symptoms reported.  . Wears dentures    full upper and lower    Past Surgical History:  Procedure Laterality Date  . ABDOMINAL HYSTERECTOMY    . APPENDECTOMY    . BACK SURGERY     this will make third time  . CARDIAC CATHETERIZATION  09/20/14   [facility] - Dr. Clayborn Bigness  . COLONOSCOPY WITH PROPOFOL N/A 07/08/2016   Procedure: COLONOSCOPY WITH PROPOFOL;  Surgeon: Lucilla Lame, MD;  Location: [place];  Service: Endoscopy;  Laterality: N/A;  . DEBRIDEMENT TENNIS ELBOW    . METATARSAL OSTEOTOMY  05/18/2013  . ROTATOR CUFF REPAIR    . TONSILLECTOMY      There were no vitals filed for this visit.  Subjective Assessment - 06/17/19 1305    Subjective  Pt. reports 6/10 R low back pain.   Pt. states 9/10 in R shoulder, esp. in lat. region.    Pertinent History  Dr. Lorin Mercy repaired herniated disc in 2012- pt. states 6 weeks later the back was fused.  Pt. reports her back did well for several years and then while lifting some drinks at work she broke a screw and then the cage dropped resulting in another surgery.  Pt. using ice packs and changes position frequently.  Pt. is on Percocet to manage pain.  Pt. has been to pain mgmt. in past.  No working at this time.  Pt. enjoys going camping/ fishing (difficulty secondary to R shoulder issues).    Limitations  Standing;Walking;Lifting;House hold activities    Diagnostic tests  X-ray    Patient Stated Goals  Decrease low back pain.    Currently in Pain?  Yes    Pain Score  6     Pain Location  Back    Pain Orientation  Right    Pain Descriptors / Indicators  Aching    Multiple Pain Sites  Yes         Manual tx.:  Supine LE/lumbar generalized stretches (10 min.) Prone STM to B lumbar paraspinals at end  of tx.  There.ex.:  Reviewed current HEP/ stretches Supine core program (handouts pages 1-3) Supine ball knee to chest/ rotn. 20x each  Prone position: ice to lumbar region while PT discussed HEP    PT Long Term Goals - 06/08/19 1620      PT LONG TERM GOAL #1   Title  Pt. will increase FOTO to 47 to improve pain-free mobility.    Baseline  Initial FOTO: 37.    Time  4    Period  Weeks    Status  New    Target Date  07/05/19      PT LONG TERM GOAL #2   Title  Pt. independent with HEP to increase core/ LE muscle strength 1/2 muscle grade to improve household tasks.    Baseline  B LE muscle strength grossly 4/5 MMT except L hip flexion/ quad/ hamstring 4+/5 MMT.    Time  4    Period  Weeks    Status  New    Target Date  07/05/19      PT LONG TERM GOAL #3   Title  Pt. will report <6/10 low back pain at worst with lifting/ daily household tasks.    Baseline  Pt. reports 8/10 low back pain currently at rest, 6/10 at  best and 10/10 at worst    Time  4    Period  Weeks    Status  New    Target Date  07/05/19      PT LONG TERM GOAL #4   Title  Pt. will report no R piriformis muscle tenderness to improve seated tolerance/ pain-free mobility.    Baseline  (+) R piriformis pain causing pain to lateral lean to L    Time  4    Period  Weeks    Status  New    Target Date  07/05/19            Plan - 06/17/19 1308    Clinical Impression Statement  Moderate B hamstring tightnesss noted during manual stretches with slight increase noted after several reps./ flossing.  Pt. able to maintain TrA muscle contraction during supine progressive strengthening program (handouts issued).  Pt. limited with any prone press-ups/ exercises involving R UE due to pain/ joint stiffness.  PT recommended pt. contact ortho MD to discuss R shoulder pain/ limitations.    Stability/Clinical Decision Making  Evolving/Moderate complexity    Clinical Decision Making  Moderate    Rehab Potential  Fair    PT Frequency  2x / week    PT Duration  4 weeks    PT Treatment/Interventions  ADLs/Self Care Home Management;Aquatic Therapy;Cryotherapy;Electrical Stimulation;Moist Heat;Gait training;Functional mobility training;Therapeutic activities;Therapeutic exercise;Balance training;Patient/family education;Neuromuscular re-education;Manual techniques;Dry needling;Passive range of motion    PT Next Visit Plan  Discuss core stability program/ lumbar MDT    PT Home Exercise Plan  See HEP       Patient will benefit from skilled therapeutic intervention in order to improve the following deficits and impairments:  Abnormal gait, Decreased endurance, Decreased mobility, Hypomobility, Improper body mechanics, Decreased range of motion, Decreased activity tolerance, Decreased strength, Impaired flexibility, Impaired UE functional use, Postural dysfunction, Pain  Visit Diagnosis: 1. Muscle weakness (generalized)   2. Chronic bilateral low back  pain with right-sided sciatica   3. Gait difficulty        Problem List Patient Active Problem List   Diagnosis Date Noted  . Vertigo 03/30/2019  . Chronic obstructive pulmonary disease ([place]) 03/30/2019  .  Panlobular emphysema ([place]) 03/02/2019  . Tachycardia 07/30/2018  . Palpitations 07/30/2018  . SOB (shortness of breath) 07/30/2018  . Full thickness rotator cuff tear 02/05/2018  . Shoulder joint pain 02/05/2018  . Chronic pain of both shoulders 01/01/2018  . Chronic bursitis of right shoulder 01/01/2018  . Frozen shoulder 01/01/2018  . Personal history of malignant melanoma 07/28/2017  . Hyperlipidemia 07/28/2017  . Elevated serum creatinine 07/28/2017  . HAV (hallux abducto valgus) 08/02/2016  . Special screening for malignant neoplasms, colon   . Angular cheilitis 06/07/2016  . Tobacco abuse 06/07/2016  . Melanoma of buttock ([place]) 08/30/2015  . Acid indigestion 09/07/2014  . Dyspepsia 09/07/2014   Pura Spice, PT, DPT # [phone] 06/17/2019, 9:05 PM  [facility] [address] [place], [place], [address] Phone: [phone]   Fax:  [phone]  Name: Caroline Conley MRN: [id number] Date of Birth: [birth year]

## 2019-06-21 ENCOUNTER — Encounter: Payer: Self-pay | Admitting: Physical Therapy

## 2019-06-21 ENCOUNTER — Ambulatory Visit: Payer: Medicaid Other | Admitting: Physical Therapy

## 2019-06-21 DIAGNOSIS — R269 Unspecified abnormalities of gait and mobility: Secondary | ICD-10-CM

## 2019-06-21 DIAGNOSIS — G8929 Other chronic pain: Secondary | ICD-10-CM

## 2019-06-21 DIAGNOSIS — M6281 Muscle weakness (generalized): Secondary | ICD-10-CM

## 2019-06-21 DIAGNOSIS — M5441 Lumbago with sciatica, right side: Secondary | ICD-10-CM

## 2019-06-21 NOTE — Therapy (Addendum)
[facility] [address] [place], [place], [address] Phone: [phone]   Fax:  [phone]  Physical Therapy Treatment  Patient Details  Name: Caroline Conley MRN: [id number] Date of Birth: July 23, 1966 Referring Provider (PT): Dr. Thereasa Parkin    Encounter Date: 06/21/2019  PT End of Session - 06/21/19 1423    Visit Number  3    Number of Visits  8    Date for PT Re-Evaluation  07/05/19    Authorization - Visit Number  3    Authorization - Number of Visits  10    PT Start Time  9485    PT Stop Time  1423    PT Time Calculation (min)  75 min    Activity Tolerance  Patient tolerated treatment well;Patient limited by pain    Behavior During Therapy  [facility] for tasks assessed/performed       Past Medical History:  Diagnosis Date  . Atypical chest pain    a. 2015 Cath ([place]): Nl cors. Nl EF; b. 10/2018 MV: No ischemia/infarct. EF>65%.  . Cancer ([place]) 01/2016   basal cell   . Emphysema of lung ([place])   . Palpitations    a. 07/2018 Echo: EF 60-65%, no rwma, Gr1 DD, Nl RV fxn; b. 07/2018 48h Holter: predominantly RSR, avg rate 106 (69-166 bpm), Rare PAC's and occas PVC's. 2 atrial runs up to 5 beats. No sustained arrhythmias or prolonged pauses. No symptoms reported.  . Wears dentures    full upper and lower    Past Surgical History:  Procedure Laterality Date  . ABDOMINAL HYSTERECTOMY    . APPENDECTOMY    . BACK SURGERY     this will make third time  . CARDIAC CATHETERIZATION  09/20/14   [facility] - Dr. Clayborn Bigness  . COLONOSCOPY WITH PROPOFOL N/A 07/08/2016   Procedure: COLONOSCOPY WITH PROPOFOL;  Surgeon: Lucilla Lame, MD;  Location: [place];  Service: Endoscopy;  Laterality: N/A;  . DEBRIDEMENT TENNIS ELBOW    . METATARSAL OSTEOTOMY  05/18/2013  . ROTATOR CUFF REPAIR    . TONSILLECTOMY      There were no vitals filed for this visit.  Subjective Assessment - 06/21/19 1422    Subjective  Pt. reports increase R gluteal/ low  back tenderness (muscle knot reported).  PT noted L gluteal tenderness during prone STM.  Pt. was active this past weekend wtih walking/ playing PokemonGo    Pertinent History  Dr. Lorin Mercy repaired herniated disc in 2012- pt. states 6 weeks later the back was fused.  Pt. reports her back did well for several years and then while lifting some drinks at work she broke a screw and then the cage dropped resulting in another surgery.  Pt. using ice packs and changes position frequently.  Pt. is on Percocet to manage pain.  Pt. has been to pain mgmt. in past.  No working at this time.  Pt. enjoys going camping/ fishing (difficulty secondary to R shoulder issues).    Limitations  Standing;Walking;Lifting;House hold activities    Diagnostic tests  X-ray    Patient Stated Goals  Decrease low back pain.    Currently in Pain?  Yes    Pain Score  6     Pain Location  Back    Pain Orientation  Right;Left         Manual tx.:  Supine hamstring/ piriformis manual stretches 4x each with nerve glides added (as tolerated) Prone STM to B gluteals/  lumbar paraspinals (tenderness noted but no radicular symptoms) Prone upper thoracic STM 3 min.  (L T3 tenderness)  There.ex.:  Supine trunk rotn. L/R 10x.   Seated ex. Ball: pelvic tilts/ scap. Retraction/ LAQ with core muscle activation (mirror feedback).   Long seated hamstring stretches L/R (cuing to increase static holds/ neutral spine) Standing hamstring stretches with plinth support for balance (moderate cuing required to improve technique) Supine TrA education (tactile feedback issued):  Progressed to pelvic tilts and marches Discussed current HEP  Ice to low back in supine during marching/ tilts.     PT Long Term Goals - 06/08/19 1620      PT LONG TERM GOAL #1   Title  Pt. will increase FOTO to 47 to improve pain-free mobility.    Baseline  Initial FOTO: 37.    Time  4    Period  Weeks    Status  New    Target Date  07/05/19      PT LONG TERM  GOAL #2   Title  Pt. independent with HEP to increase core/ LE muscle strength 1/2 muscle grade to improve household tasks.    Baseline  B LE muscle strength grossly 4/5 MMT except L hip flexion/ quad/ hamstring 4+/5 MMT.    Time  4    Period  Weeks    Status  New    Target Date  07/05/19      PT LONG TERM GOAL #3   Title  Pt. will report <6/10 low back pain at worst with lifting/ daily household tasks.    Baseline  Pt. reports 8/10 low back pain currently at rest, 6/10 at best and 10/10 at worst    Time  4    Period  Weeks    Status  New    Target Date  07/05/19      PT LONG TERM GOAL #4   Title  Pt. will report no R piriformis muscle tenderness to improve seated tolerance/ pain-free mobility.    Baseline  (+) R piriformis pain causing pain to lateral lean to L    Time  4    Period  Weeks    Status  New    Target Date  07/05/19            Plan - 06/21/19 1424    Clinical Impression Statement  Pts. pelvic/lumbar AROM limited by pain, esp. noted during posterior pelvic tilt.  Pt. prefers a more flexed, kyphotic posture with limited lumbar lordosis noted.  Pt. has (+) tenderness with palpation over L./R T12-L1 region in prone position.  No radicular symtoms reported during STM.  (+) tenderness over L T3 paraspinals with palpation.  PT focused on proper TrA muscle activation in supine/ seated posture with tactile feedback.  Pt. continues to present with B tight hamstrings (increase R hamstring flexibility as compared to L).  Pt. enjoys ice and instructed to focus on previous core progressive ex. program.    Stability/Clinical Decision Making  Evolving/Moderate complexity    Clinical Decision Making  Moderate    Rehab Potential  Fair    PT Frequency  2x / week    PT Duration  4 weeks    PT Treatment/Interventions  ADLs/Self Care Home Management;Aquatic Therapy;Cryotherapy;Electrical Stimulation;Moist Heat;Gait training;Functional mobility training;Therapeutic activities;Therapeutic  exercise;Balance training;Patient/family education;Neuromuscular re-education;Manual techniques;Dry needling;Passive range of motion    PT Next Visit Plan  Progress core stability    PT Home Exercise Plan  See HEP  Patient will benefit from skilled therapeutic intervention in order to improve the following deficits and impairments:  Abnormal gait, Decreased endurance, Decreased mobility, Hypomobility, Improper body mechanics, Decreased range of motion, Decreased activity tolerance, Decreased strength, Impaired flexibility, Impaired UE functional use, Postural dysfunction, Pain  Visit Diagnosis: 1. Chronic bilateral low back pain with right-sided sciatica   2. Muscle weakness (generalized)   3. Gait difficulty        Problem List Patient Active Problem List   Diagnosis Date Noted  . Vertigo 03/30/2019  . Chronic obstructive pulmonary disease ([place]) 03/30/2019  . Panlobular emphysema ([place]) 03/02/2019  . Tachycardia 07/30/2018  . Palpitations 07/30/2018  . SOB (shortness of breath) 07/30/2018  . Full thickness rotator cuff tear 02/05/2018  . Shoulder joint pain 02/05/2018  . Chronic pain of both shoulders 01/01/2018  . Chronic bursitis of right shoulder 01/01/2018  . Frozen shoulder 01/01/2018  . Personal history of malignant melanoma 07/28/2017  . Hyperlipidemia 07/28/2017  . Elevated serum creatinine 07/28/2017  . HAV (hallux abducto valgus) 08/02/2016  . Special screening for malignant neoplasms, colon   . Angular cheilitis 06/07/2016  . Tobacco abuse 06/07/2016  . Melanoma of buttock ([place]) 08/30/2015  . Acid indigestion 09/07/2014  . Dyspepsia 09/07/2014   Pura Spice, PT, DPT # [id number] 06/21/2019, 2:30 PM  [facility] [address] [place], [place], [address] Phone: [phone]   Fax:  [phone]  Name: Caroline Conley MRN: [id number] Date of Birth: 18-Aug-1966

## 2019-06-22 ENCOUNTER — Other Ambulatory Visit: Payer: Self-pay

## 2019-06-22 DIAGNOSIS — J441 Chronic obstructive pulmonary disease with (acute) exacerbation: Secondary | ICD-10-CM

## 2019-06-23 ENCOUNTER — Ambulatory Visit: Payer: Medicaid Other | Admitting: Physical Therapy

## 2019-06-28 ENCOUNTER — Ambulatory Visit: Payer: Medicaid Other | Admitting: Physical Therapy

## 2019-06-28 ENCOUNTER — Other Ambulatory Visit: Payer: Self-pay

## 2019-06-28 ENCOUNTER — Encounter: Payer: Self-pay | Admitting: Physical Therapy

## 2019-06-28 DIAGNOSIS — M5441 Lumbago with sciatica, right side: Secondary | ICD-10-CM | POA: Diagnosis not present

## 2019-06-28 DIAGNOSIS — G8929 Other chronic pain: Secondary | ICD-10-CM

## 2019-06-28 DIAGNOSIS — M6281 Muscle weakness (generalized): Secondary | ICD-10-CM

## 2019-06-28 DIAGNOSIS — R269 Unspecified abnormalities of gait and mobility: Secondary | ICD-10-CM

## 2019-06-28 NOTE — Therapy (Signed)
[facility] [address] [place], [place], [address] Phone: [phone]   Fax:  [phone]  Physical Therapy Treatment  Patient Details  Name: Caroline Conley MRN: [id number] Date of Birth: 02-Jun-1966 Referring Provider (PT): Dr. Thereasa Parkin    Encounter Date: 06/28/2019  PT End of Session - 06/28/19 1435    Visit Number  4    Number of Visits  8    Date for PT Re-Evaluation  07/05/19    Authorization - Visit Number  4    Authorization - Number of Visits  10    PT Start Time  1350    PT Stop Time  1433    PT Time Calculation (min)  43 min    Activity Tolerance  Patient tolerated treatment well;Patient limited by pain    Behavior During Therapy  [facility] for tasks assessed/performed       Past Medical History:  Diagnosis Date  . Atypical chest pain    a. 2015 Cath ([place]): Nl cors. Nl EF; b. 10/2018 MV: No ischemia/infarct. EF>65%.  . Cancer ([place]) 01/2016   basal cell   . Emphysema of lung ([place])   . Palpitations    a. 07/2018 Echo: EF 60-65%, no rwma, Gr1 DD, Nl RV fxn; b. 07/2018 48h Holter: predominantly RSR, avg rate 106 (69-166 bpm), Rare PAC's and occas PVC's. 2 atrial runs up to 5 beats. No sustained arrhythmias or prolonged pauses. No symptoms reported.  . Wears dentures    full upper and lower    Past Surgical History:  Procedure Laterality Date  . ABDOMINAL HYSTERECTOMY    . APPENDECTOMY    . BACK SURGERY     this will make third time  . CARDIAC CATHETERIZATION  09/20/14   [facility] - Dr. Clayborn Bigness  . COLONOSCOPY WITH PROPOFOL N/A 07/08/2016   Procedure: COLONOSCOPY WITH PROPOFOL;  Surgeon: Lucilla Lame, MD;  Location: [place];  Service: Endoscopy;  Laterality: N/A;  . DEBRIDEMENT TENNIS ELBOW    . METATARSAL OSTEOTOMY  05/18/2013  . ROTATOR CUFF REPAIR    . TONSILLECTOMY      There were no vitals filed for this visit.  Subjective Assessment - 06/28/19 1431    Subjective  Pt reports 9/10 pain in R shoulder  today and states she "can hardly move" her shoulder. Pt reports 5-6/10 LBP. Pt reports she had been doing piriformis stretch at home and that it helps decrease her pain.    Pertinent History  Dr. Lorin Mercy repaired herniated disc in 2012- pt. states 6 weeks later the back was fused.  Pt. reports her back did well for several years and then while lifting some drinks at work she broke a screw and then the cage dropped resulting in another surgery.  Pt. using ice packs and changes position frequently.  Pt. is on Percocet to manage pain.  Pt. has been to pain mgmt. in past.  No working at this time.  Pt. enjoys going camping/ fishing (difficulty secondary to R shoulder issues).    Limitations  Standing;Walking;Lifting;House hold activities    Diagnostic tests  X-ray    Patient Stated Goals  Decrease low back pain.    Currently in Pain?  Yes    Pain Score  6     Pain Location  Back    Pain Orientation  Lower           Therapeutic Exercise:  Supine hamstring/ piriformis stretch - 2x60 sec B Supine assisted  LE nerve glide - 2x30 sec B Supine TA activation - x10. Cuing provided. Supine Marches with TA activation - 2x20 B Supine modified deadbug - 2x20 B Glute bridges - 2x20  Standing spinal flexion, lateral flexion, extension, B rotation x 2-3 each. Limited in flexion, lateral flexion, and extension.     Manual Therapy:  L sidelying (due to R shoulder limitations) STM to B paravertebral musculature T8-L5 - x9 min       PT Education - 06/28/19 1434    Education Details  Pt educated on holding stretches for 20-30 seconds to make ROM gains    Person(s) Educated  Patient    Methods  Explanation    Comprehension  Verbalized understanding          PT Long Term Goals - 06/08/19 1620      PT LONG TERM GOAL #1   Title  Pt. will increase FOTO to 47 to improve pain-free mobility.    Baseline  Initial FOTO: 37.    Time  4    Period  Weeks    Status  New    Target Date  07/05/19       PT LONG TERM GOAL #2   Title  Pt. independent with HEP to increase core/ LE muscle strength 1/2 muscle grade to improve household tasks.    Baseline  B LE muscle strength grossly 4/5 MMT except L hip flexion/ quad/ hamstring 4+/5 MMT.    Time  4    Period  Weeks    Status  New    Target Date  07/05/19      PT LONG TERM GOAL #3   Title  Pt. will report <6/10 low back pain at worst with lifting/ daily household tasks.    Baseline  Pt. reports 8/10 low back pain currently at rest, 6/10 at best and 10/10 at worst    Time  4    Period  Weeks    Status  New    Target Date  07/05/19      PT LONG TERM GOAL #4   Title  Pt. will report no R piriformis muscle tenderness to improve seated tolerance/ pain-free mobility.    Baseline  (+) R piriformis pain causing pain to lateral lean to L    Time  4    Period  Weeks    Status  New    Target Date  07/05/19            Plan - 06/28/19 1436    Clinical Impression Statement  Pt able to reduce LBP in session today with supine piriformis stretch with overpressure. However, pt still with limited spinal flexion, extension and lateral flexion secondary to pain. Pt TTP along lumbar spine B and with pain with SLR indicating increased neural tension. Pt will continue to benefit from further physical therapy to increase ROM of the thoracic and lumbar spine and to decrease LBP.    Stability/Clinical Decision Making  Evolving/Moderate complexity    Clinical Decision Making  Moderate    Rehab Potential  Fair    PT Frequency  2x / week    PT Duration  4 weeks    PT Treatment/Interventions  ADLs/Self Care Home Management;Aquatic Therapy;Cryotherapy;Electrical Stimulation;Moist Heat;Gait training;Functional mobility training;Therapeutic activities;Therapeutic exercise;Balance training;Patient/family education;Neuromuscular re-education;Manual techniques;Dry needling;Passive range of motion    PT Next Visit Plan  Progress core stability.   ISSUE NEW HEP    PT  Home Exercise Plan  See HEP  Patient will benefit from skilled therapeutic intervention in order to improve the following deficits and impairments:  Abnormal gait, Decreased endurance, Decreased mobility, Hypomobility, Improper body mechanics, Decreased range of motion, Decreased activity tolerance, Decreased strength, Impaired flexibility, Impaired UE functional use, Postural dysfunction, Pain  Visit Diagnosis: 1. Chronic bilateral low back pain with right-sided sciatica   2. Muscle weakness (generalized)   3. Gait difficulty        Problem List Patient Active Problem List   Diagnosis Date Noted  . Vertigo 03/30/2019  . Chronic obstructive pulmonary disease ([place]) 03/30/2019  . Panlobular emphysema ([place]) 03/02/2019  . Tachycardia 07/30/2018  . Palpitations 07/30/2018  . SOB (shortness of breath) 07/30/2018  . Full thickness rotator cuff tear 02/05/2018  . Shoulder joint pain 02/05/2018  . Chronic pain of both shoulders 01/01/2018  . Chronic bursitis of right shoulder 01/01/2018  . Frozen shoulder 01/01/2018  . Personal history of malignant melanoma 07/28/2017  . Hyperlipidemia 07/28/2017  . Elevated serum creatinine 07/28/2017  . HAV (hallux abducto valgus) 08/02/2016  . Special screening for malignant neoplasms, colon   . Angular cheilitis 06/07/2016  . Tobacco abuse 06/07/2016  . Melanoma of buttock ([place]) 08/30/2015  . Acid indigestion 09/07/2014  . Dyspepsia 09/07/2014   Pura Spice, PT, DPT # [phone] 06/28/2019, 2:46 PM  [facility] [address] [place], [place], [address] Phone: [phone]   Fax:  [phone]  Name: JULENA BARBOUR MRN: [id number] Date of Birth: [birth year]

## 2019-06-30 ENCOUNTER — Ambulatory Visit: Payer: Medicaid Other | Admitting: Physical Therapy

## 2019-07-01 ENCOUNTER — Ambulatory Visit: Payer: Medicaid Other | Admitting: Physical Therapy

## 2019-07-05 ENCOUNTER — Encounter: Payer: BC Managed Care – PPO | Admitting: Physical Therapy

## 2019-07-07 ENCOUNTER — Ambulatory Visit: Payer: Medicaid Other | Admitting: Physical Therapy

## 2019-07-12 ENCOUNTER — Encounter: Payer: BC Managed Care – PPO | Admitting: Physical Therapy

## 2019-07-14 ENCOUNTER — Encounter: Payer: BC Managed Care – PPO | Admitting: Physical Therapy

## 2019-07-15 IMAGING — CR DG CHEST 2V
1 series · 2 of 2 positions shown · non-contrast
Comparison: 08/27/2017

CLINICAL DATA: COPD and cough

EXAM:
CHEST - 2 VIEW

[Series 1: dg chest 2 view · 0.14mm/px · 2 of 2 slices shown]
[im 1/2]
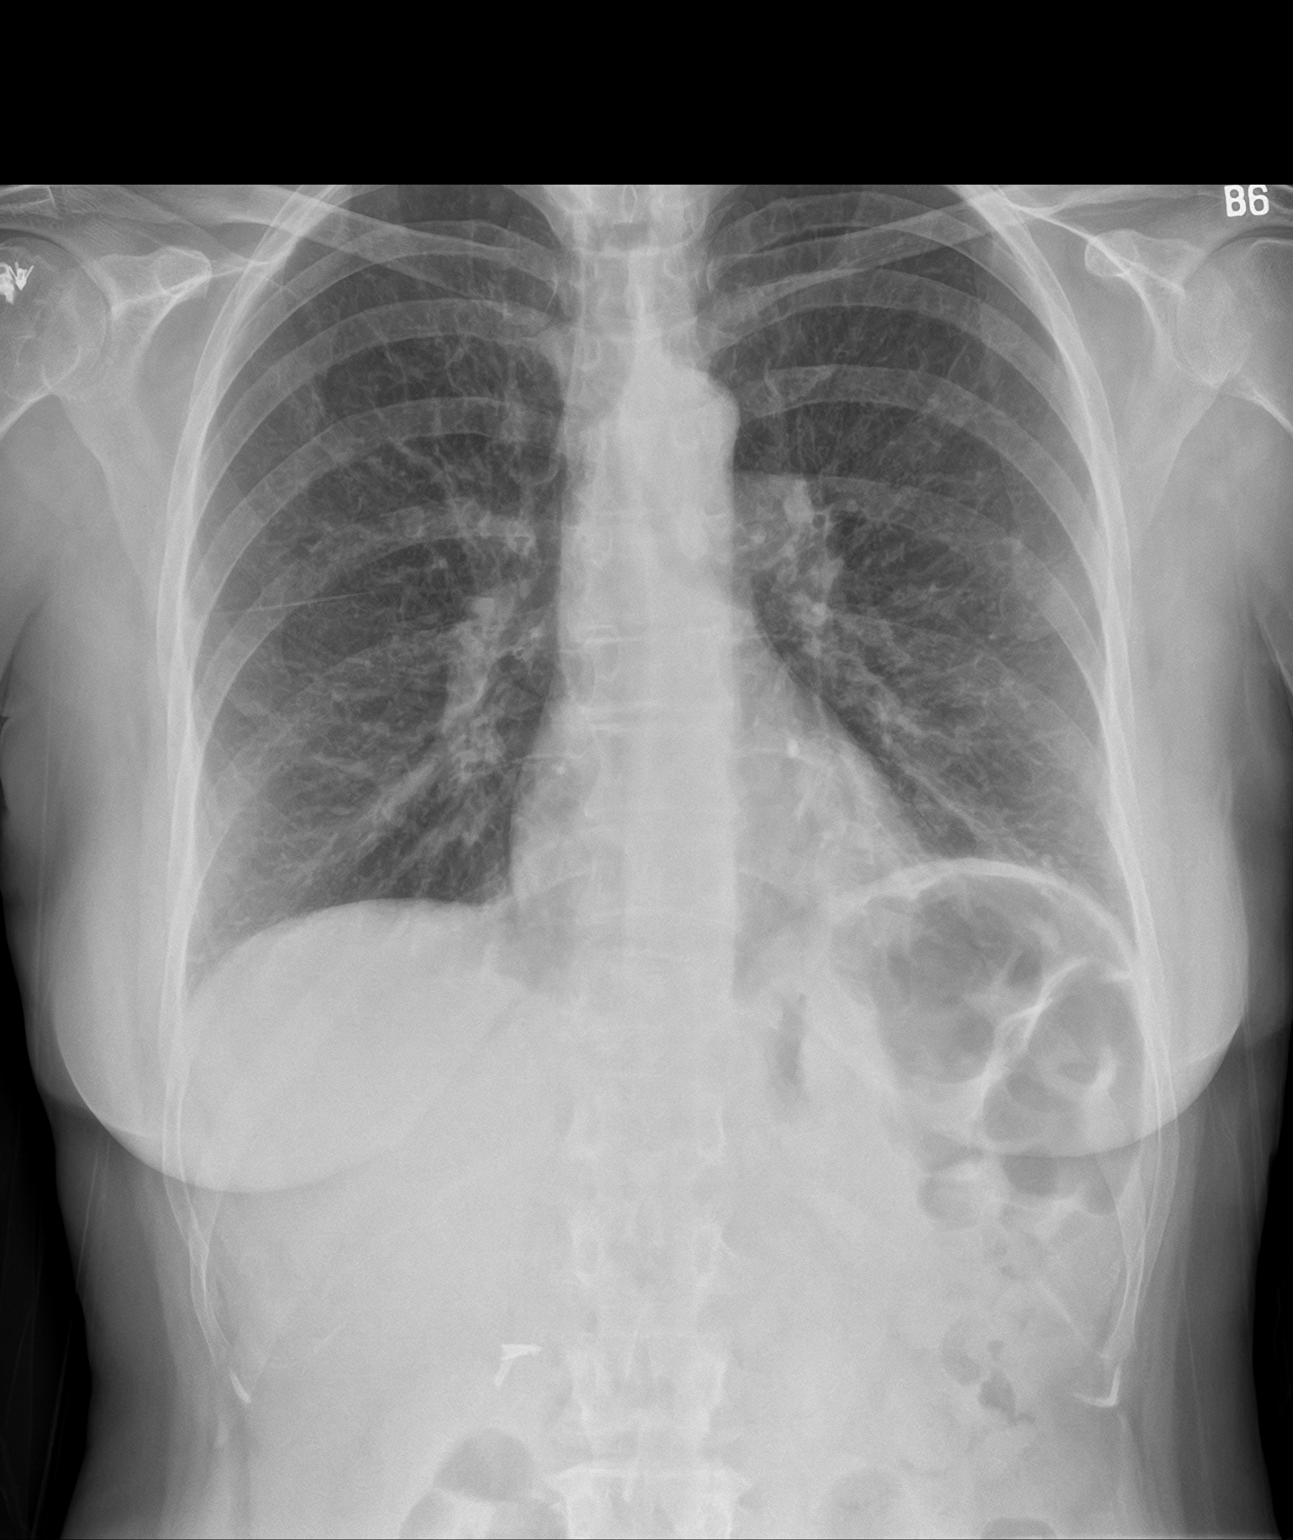
[im 2/2]
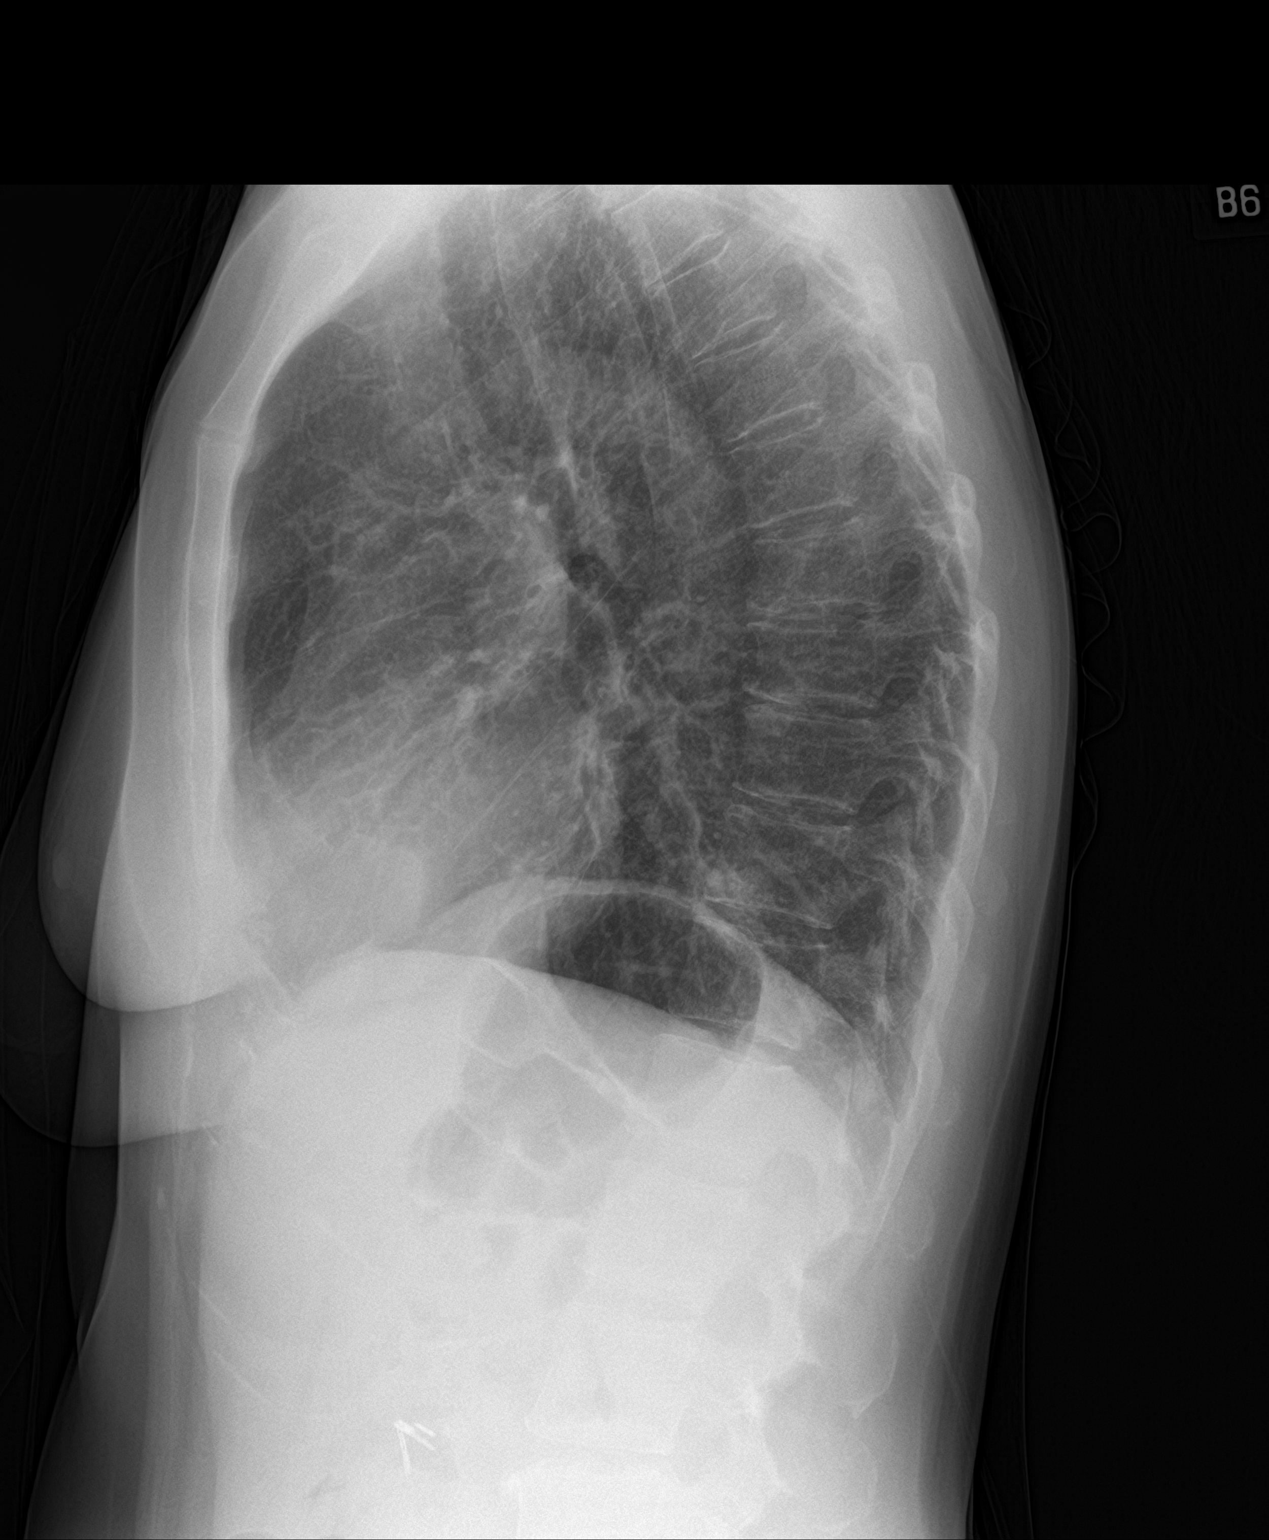

[2 of 2 positions shown; findings below may reference images not displayed]

FINDINGS: Cardiac shadow is stable. The lungs are well aerated bilaterally.
Minimal left basilar infiltrate is noted no sizable effusion is
seen. No bony abnormality is noted.
IMPRESSION: Minimal left basilar infiltrate.

## 2019-07-19 ENCOUNTER — Telehealth: Payer: Self-pay | Admitting: Internal Medicine

## 2019-07-19 IMAGING — CT CT ANGIO CHEST
2 of 6 series · 18 of 46 positions shown · IV contrast (APPLIED)
Comparison: Prior CT scan of the chest 08/27/2017

CLINICAL DATA: 51-year-old female with shortness of breath and a
clinical history of COPD.

EXAM:
CT ANGIOGRAPHY CHEST WITH CONTRAST
TECHNIQUE: Multidetector CT imaging of the chest was performed using the
standard protocol during bolus administration of intravenous
contrast. Multiplanar CT image reconstructions and MIPs were
obtained to evaluate the vascular anatomy.
CONTRAST:  75mL OMNIPAQUE IOHEXOL 350 MG/ML SOLN

[Series 5: thins · axial · 0.66mm/px · z∈[-18,+276]mm · 16 of 324 slices shown]
[im 15/324  lung]
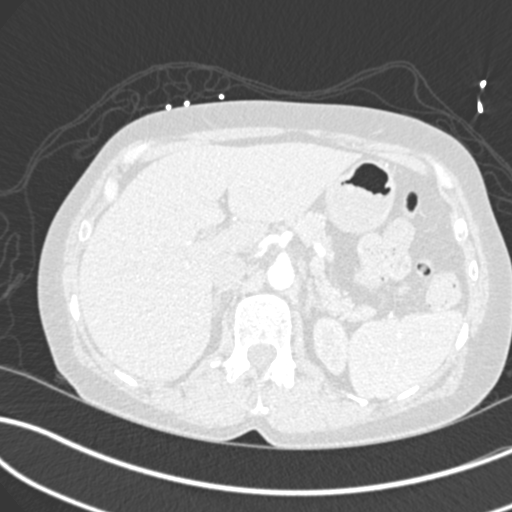
[im 43/324  soft-tissue]
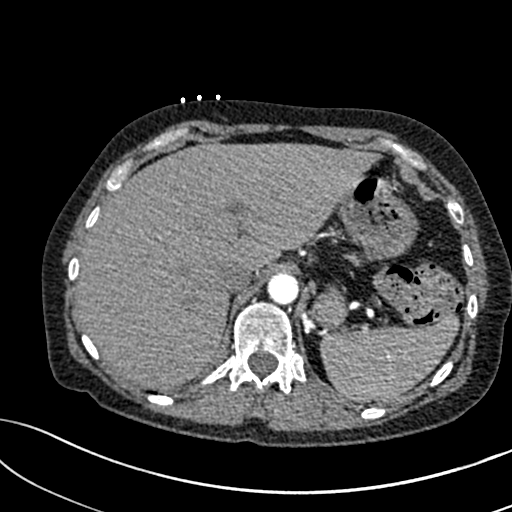
[im 57/324  lung]
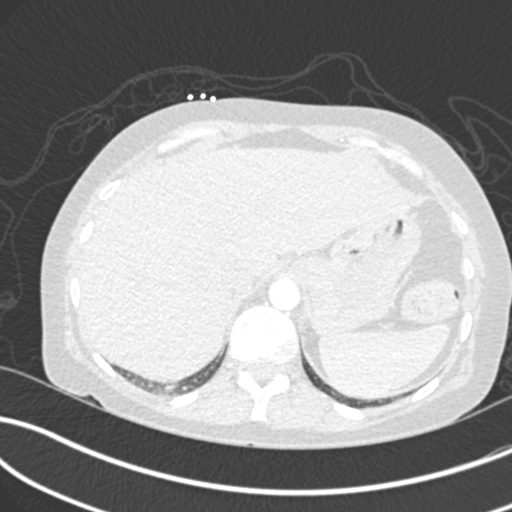
[im 71/324  soft-tissue]
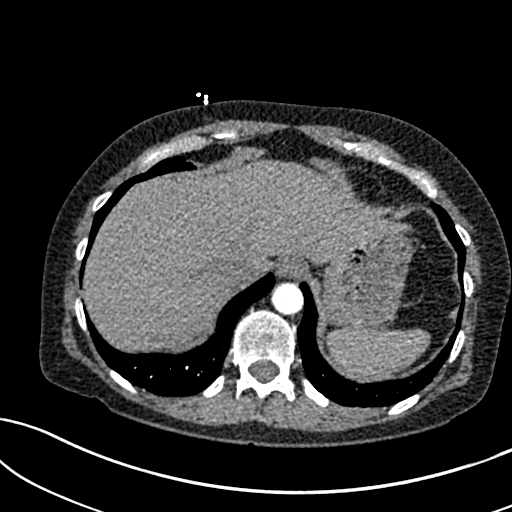
[im 99/324  lung]
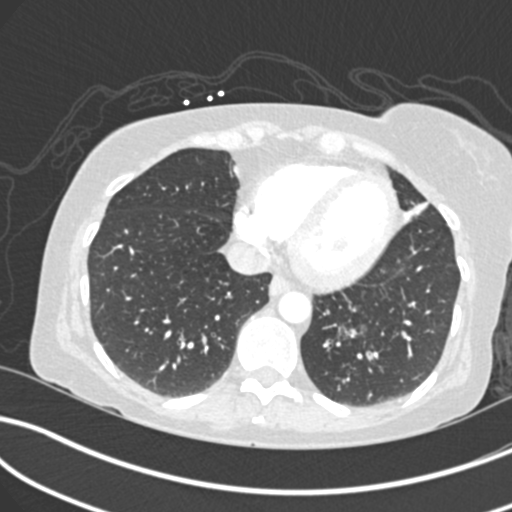
[im 113/324  soft-tissue]
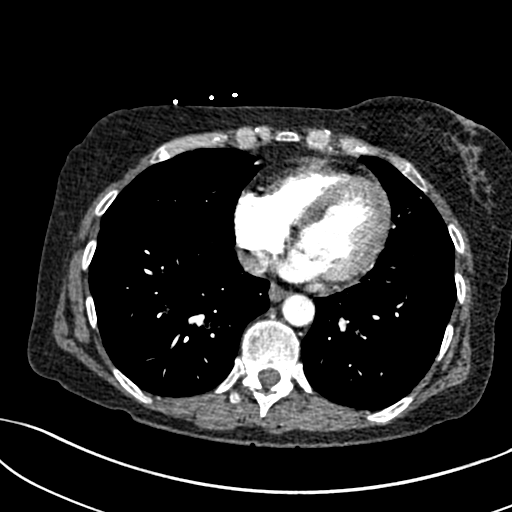
[im 127/324  lung]
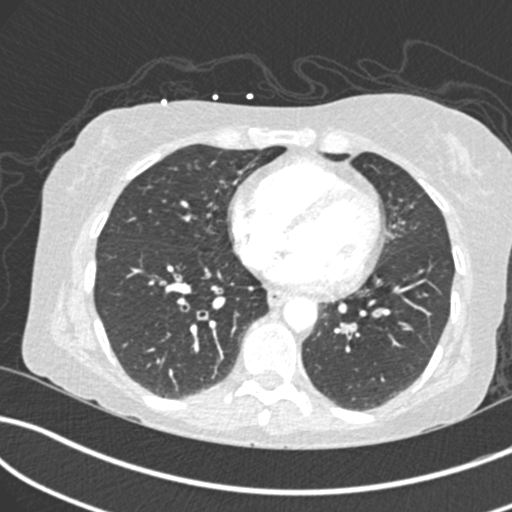
[im 155/324  soft-tissue]
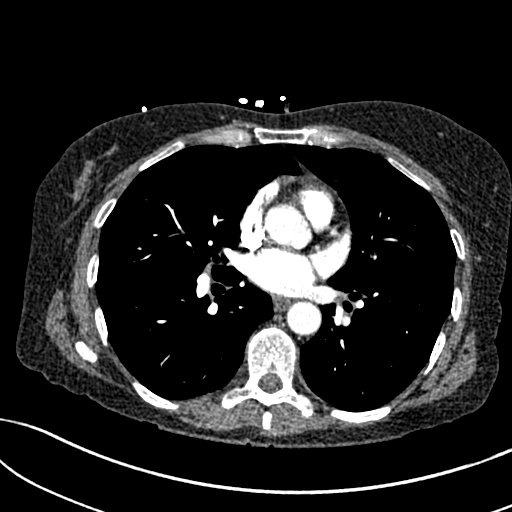
[im 169/324  lung]
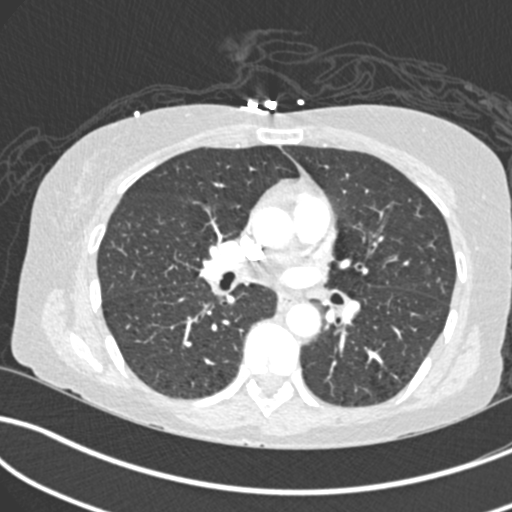
[im 197/324  soft-tissue]
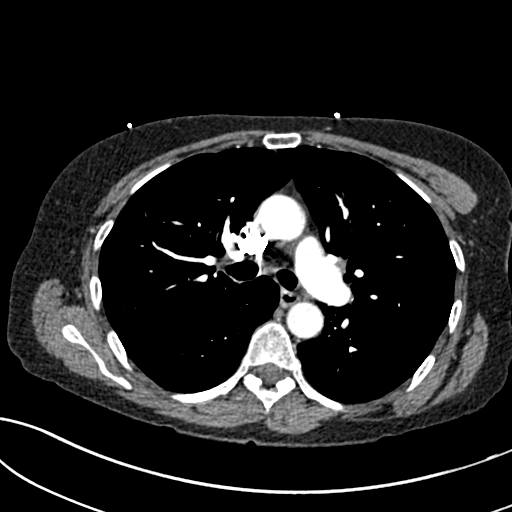
[im 211/324  lung]
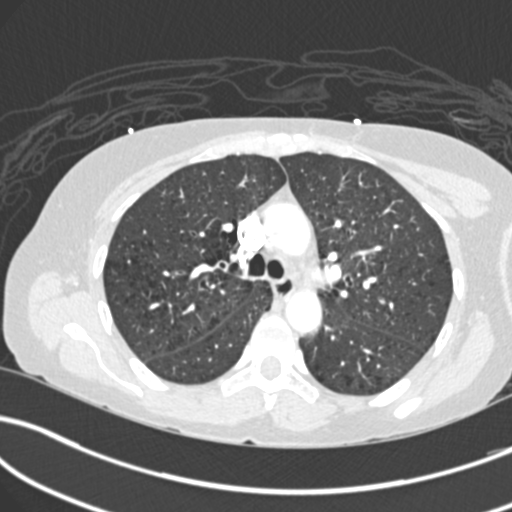
[im 225/324  soft-tissue]
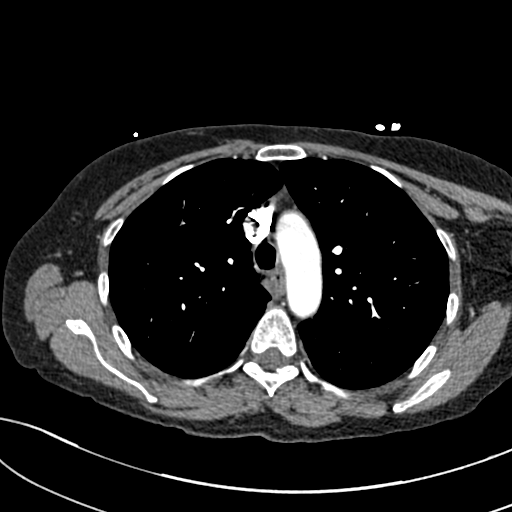
[im 253/324  lung]
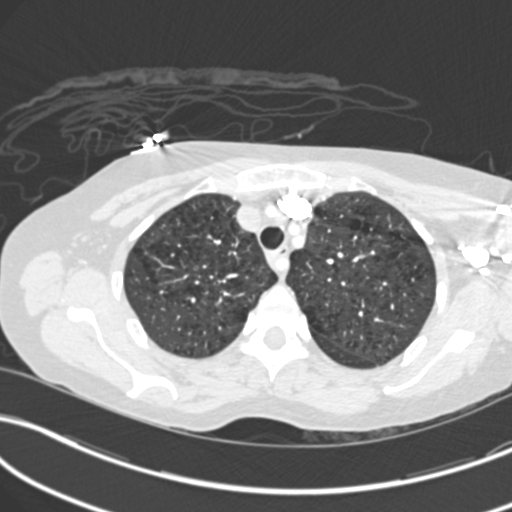
[im 267/324  soft-tissue]
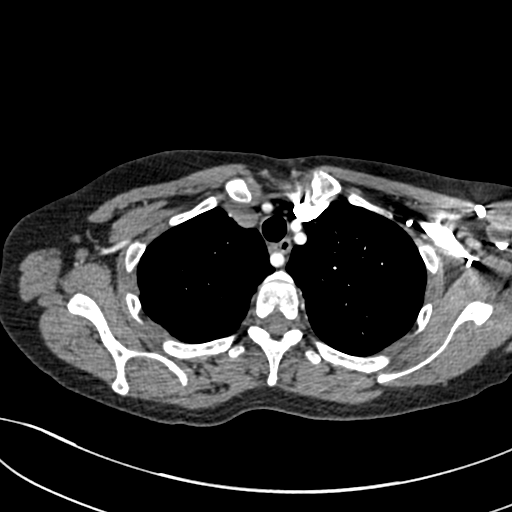
[im 281/324  lung]
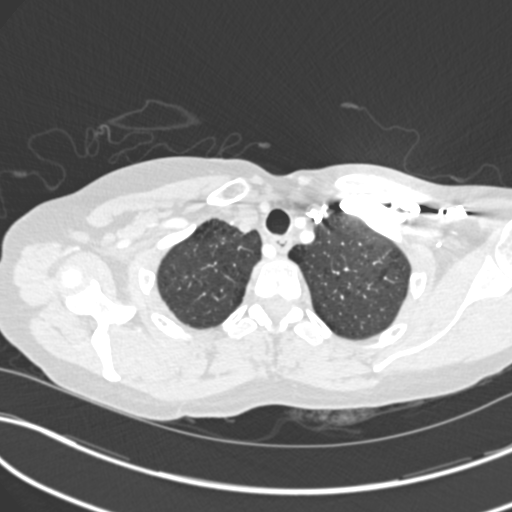
[im 309/324  soft-tissue]
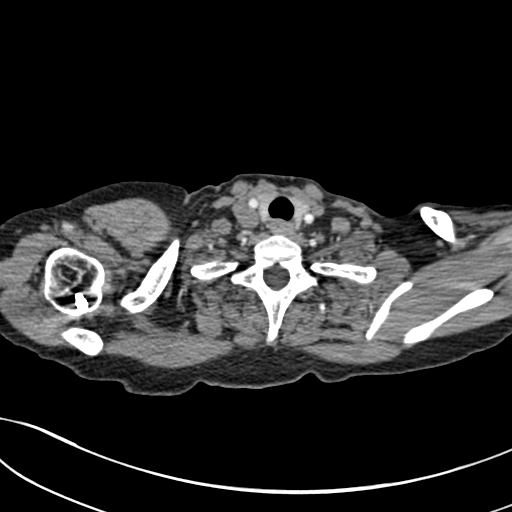

[Series 7: coronal mpr · coronal · 0.58mm/px · 2 of 76 slices shown]
[im 26/76  soft-tissue]
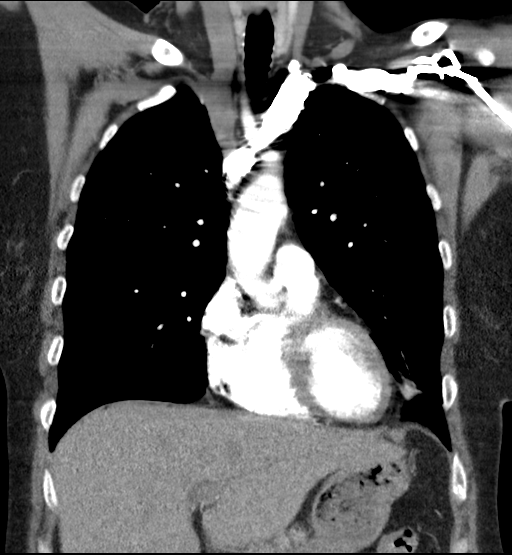
[im 51/76  soft-tissue]
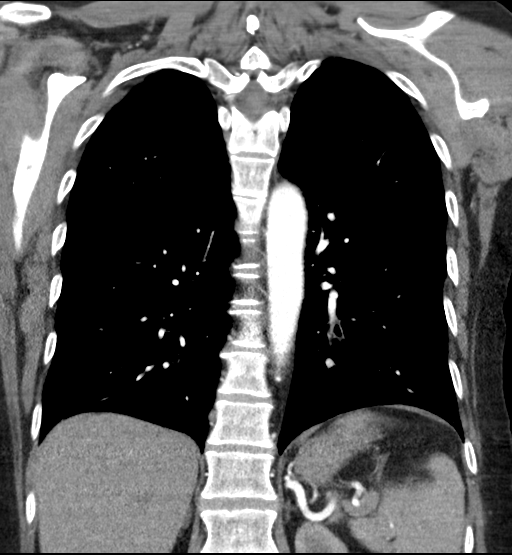

[18 of 46 positions shown; findings below may reference images not displayed]

FINDINGS: Cardiovascular: Adequate opacification of the pulmonary arteries to
the proximal segmental level. No evidence of central filling defect
to suggest acute pulmonary embolus. The heart is normal in size. No
pericardial effusion. Normal caliber aorta without evidence of
aneurysm. Average right subclavian artery noted incidentally. Mild
fibrofatty atherosclerotic plaque at the origin of the left
subclavian artery results in mild to moderate stenosis.

Mediastinum/Nodes: Unremarkable CT appearance of the thyroid gland.
No suspicious mediastinal or hilar adenopathy. No soft tissue
mediastinal mass. The thoracic esophagus is unremarkable.

Lungs/Pleura: Moderately severe combined paraseptal and
centrilobular pulmonary emphysema. No evidence of focal airspace
consolidation, pulmonary edema, pleural effusion or pneumothorax. No
suspicious pulmonary mass or nodule. Diffuse mild bronchial wall
thickening.

Upper Abdomen: Visualized upper abdominal organs are unremarkable.

Musculoskeletal: No acute fracture or aggressive appearing lytic or
blastic osseous lesion.

Review of the MIP images confirms the above findings.
IMPRESSION: 1. Negative for acute pulmonary embolus, pneumonia or other acute
cardiopulmonary process.
2. Moderately severe combined centrilobular and paraseptal pulmonary
emphysema as well as diffuse mild bronchial wall thickening
consistent with the clinical history of COPD.
3. Average right subclavian artery noted incidentally.

Aortic Atherosclerosis (AIWZ0-253.3) and Emphysema (AIWZ0-EYK.L).

## 2019-07-19 NOTE — Telephone Encounter (Signed)
PA for Daliresp 557mcg has been approved.  Both pt and pharmacy has been made aware of approval.  Nothing further is needed.

## 2019-07-26 DIAGNOSIS — S46011A Strain of muscle(s) and tendon(s) of the rotator cuff of right shoulder, initial encounter: Secondary | ICD-10-CM | POA: Diagnosis not present

## 2019-07-27 ENCOUNTER — Ambulatory Visit
Admission: RE | Admit: 2019-07-27 | Discharge: 2019-07-27 | Disposition: A | Discharge status: Home / Self Care | Payer: BC Managed Care – PPO | Source: Ambulatory Visit | Attending: Internal Medicine | Admitting: Internal Medicine

## 2019-07-27 DIAGNOSIS — J441 Chronic obstructive pulmonary disease with (acute) exacerbation: Secondary | ICD-10-CM

## 2019-07-28 ENCOUNTER — Other Ambulatory Visit: Payer: Self-pay | Admitting: Family Medicine

## 2019-07-28 DIAGNOSIS — B370 Candidal stomatitis: Secondary | ICD-10-CM

## 2019-08-02 ENCOUNTER — Other Ambulatory Visit: Payer: Self-pay

## 2019-08-02 ENCOUNTER — Encounter: Payer: Self-pay | Admitting: Podiatry

## 2019-08-02 ENCOUNTER — Encounter: Payer: Self-pay | Admitting: Family Medicine

## 2019-08-02 ENCOUNTER — Ambulatory Visit (INDEPENDENT_AMBULATORY_CARE_PROVIDER_SITE_OTHER): Payer: Medicaid Other | Admitting: Podiatry

## 2019-08-02 ENCOUNTER — Ambulatory Visit (INDEPENDENT_AMBULATORY_CARE_PROVIDER_SITE_OTHER): Payer: Medicaid Other | Admitting: Family Medicine

## 2019-08-02 VITALS — Resp 16 | Ht 66.0 in

## 2019-08-02 VITALS — Temp 98.7°F

## 2019-08-02 DIAGNOSIS — M7752 Other enthesopathy of left foot: Secondary | ICD-10-CM

## 2019-08-02 DIAGNOSIS — M778 Other enthesopathies, not elsewhere classified: Secondary | ICD-10-CM

## 2019-08-02 DIAGNOSIS — D3610 Benign neoplasm of peripheral nerves and autonomic nervous system, unspecified: Secondary | ICD-10-CM

## 2019-08-02 DIAGNOSIS — J441 Chronic obstructive pulmonary disease with (acute) exacerbation: Secondary | ICD-10-CM | POA: Diagnosis not present

## 2019-08-02 DIAGNOSIS — J431 Panlobular emphysema: Secondary | ICD-10-CM

## 2019-08-02 MED ORDER — DOXYCYCLINE HYCLATE 100 MG PO TABS: 100.0000 mg | ORAL_TABLET | Freq: Two times a day (BID) | ORAL | 0 refills | Status: DC

## 2019-08-02 MED ORDER — HYDROCOD POLST-CPM POLST ER 10-8 MG/5ML PO SUER: 5.0000 mL | Freq: Two times a day (BID) | ORAL | 0 refills | Status: DC | PRN

## 2019-08-02 MED ORDER — PREDNISONE 20 MG PO TABS: ORAL_TABLET | ORAL | 0 refills | Status: DC

## 2019-08-02 NOTE — Progress Notes (Signed)
This patient presents the office with chief complaint of pain noted in her left forefoot for approximately 5 days.  She says she received an injection from Dr. Paulla Dolly in January 2020 and her foot has calmed down until 5 days ago.  Her left forefoot now is swollen and painful in the second interspace left foot.  She presents the office today for continued evaluation and treatment of this painful left forefoot.  Vascular  Dorsalis pedis and posterior tibial pulses are palpable  B/L.  Capillary return  WNL.  Temperature gradient is  WNL.  Skin turgor  WNL  Sensorium  Senn Weinstein monofilament wire  WNL. Normal tactile sensation.  Nail Exam  Patient has normal nails with no evidence of bacterial or fungal infection.  Orthopedic  Exam  Muscle tone and muscle strength  WNL.  No limitations of motion feet  B/L.  No crepitus or joint effusion noted.  Foot type is unremarkable and digits show no abnormalities.  Patient has swelling and pain noted  in the second metatarsal  interspace between the second and third met heads.  No pain noted upon range of motion of the third MPJ left.  Palpable pain noted to the nerve in the second interspace left foot.  Skin  No open lesions.  Normal skin texture and turgor.  Neuroma second interspace left foot.  ROV  Injection therapy second interspace left foot.  Injection therapy using 1.0 cc. Of 2% xylocaine( 20 mg.) plus 1 cc. of kenalog-la ( 10 mg) plus 1/2 cc. of dexamethazone phosphate ( 2 mg).  RTC prn.  Gardiner Barefoot DPM I

## 2019-08-02 NOTE — Patient Instructions (Addendum)
Doxycycline Prednisone 10 day, 7 days at 40mg  and last 3 days at lower dose 20mg  Tussionex refill  F/u with Dr Mortimer Fries for CT and further evaluation  Please schedule a Follow-up Appointment to: Return if symptoms worsen or fail to improve, for COPD.  If you have any other questions or concerns, please feel free to call the office or send a message through [place]. You may also schedule an earlier appointment if necessary.  Additionally, you may be receiving a survey about your experience at our office within a few days to 1 week by e-mail or mail. We value your feedback.  [name], DO [place]

## 2019-08-02 NOTE — Progress Notes (Signed)
Virtual Visit via Telephone The purpose of this virtual visit is to provide medical care while limiting exposure to the novel coronavirus (COVID19) for both patient and office staff.  Consent was obtained for phone visit:  Yes.   Answered questions that patient had about telehealth interaction:  Yes.   I discussed the limitations, risks, security and privacy concerns of performing an evaluation and management service by telephone. I also discussed with the patient that there may be a patient responsible charge related to this service. The patient expressed understanding and agreed to proceed.  Patient Location: Home Provider Location: [facility])   ---------------------------------------------------------------------- Chief Complaint  Patient presents with  . COPD    Telephone Visit     S: Reviewed CMA documentation. I have called patient and gathered additional HPI as follows:  ACUTE COPD EXACERBATION / Moderate to Severe Panlobular Emphysema - Last visit for similar AECOPD flare 05/19/2019 virtual visit from our office, treated levaquin and prednisone and then on 06/02/19 with Dr Mortimer Fries (Pulmonology) virtual visit - started trial on Daliresp, prednisone taper, Tiotropium bromide, awaiting on future approval CT - already had CXR 07/27/19 - She denies any sick contacts, has been at home 98% of the time she says, wears mask - Today current onset symptoms started about 7 days ago. She has had worsening symptoms in past 2-3 days worsening. In past she was on longer prednisone course upto 10 days per Pulm instead of usual 5 day course. Describes thick sputum production and shortness of breath - Continues on Symbicort, Spiriva - At risk with second hand smoke exposure Denies myalgias, fever chills,hemoptysis, chest pain, sweats, nausea vomiting  Denies any high risk travel to areas of current concern for COVID19. Denies any known or suspected exposure to person with or  possibly with COVID19.  Denies any fevers, chills, sweats, body ache, cough, shortness of breath, sinus pain or pressure, headache, abdominal pain, diarrhea  -------------------------------------------------------------------------- O: No physical exam performed due to remote telephone encounter.  -------------------------------------------------------------------------- A&P:   Clinically with acute COPD exacerbation, worsening productive cough and dyspnea Advanced COPD, seems to have persistent recurrent flares, recently about 3 flares in 3 months, improved with antibiotics / prednisone - Continues on maintenance therapy inhalers/nebs - failed daliresp Followed by Dr Karilyn Cota  Plan - Start taking Doxycycline antibiotic 100mg  twice daily for 10 days. Take with full glass of water and stay upright for at least 30 min after taking, may be seated or standing, but should NOT lay down. This is just a safety precaution, if this medicine does not go all the way down throat well it could cause some burning discomfort to throat and esophagus. - Trial on extended prednisone course similar to Pulmonology - 10 day course, 7 day at 40mg  then 3 day at 20mg  - Re order Tussionex syrup - Return to [facility] as planned for upcoming CT evaluation   Meds ordered this encounter  Medications  . doxycycline (VIBRA-TABS) 100 MG tablet    Sig: Take 1 tablet (100 mg total) by mouth 2 (two) times daily. For 10 days. Take with full glass of water, stay upright 30 min after taking.    Dispense:  20 tablet    Refill:  0  . predniSONE (DELTASONE) 20 MG tablet    Sig: Take 2 tablets daily (40mg ) for 7 days, take 1 tab daily (20mg ) for 3 days    Dispense:  17 tablet    Refill:  0  . chlorpheniramine-HYDROcodone (TUSSIONEX  PENNKINETIC ER) 10-8 MG/5ML SUER    Sig: Take 5 mLs by mouth every 12 (twelve) hours as needed for cough.    Dispense:  140 mL    Refill:  0    OPTIONAL RECOMMENDED self quarantine for patient  safety for PREVENTION ONLY. It is not required based on current clinical symptoms. If they were to develop fever or worsening shortness of breath, then emphasis on REQUIRED quarantine for up to 7-14 days that could be resolved if fever free >3 days AND if symptoms improving after 7 days.   If symptoms do not resolve or significantly improve OR if WORSENING - fever / cough - or worsening shortness of breath - then should contact us and seek advice on next steps in treatment at home vs where/when to seek care at Urgent Care or Hospital ED for further intervention and possible testing if indicated.  Patient verbalizes understanding with the above medical recommendations including the limitation of remote medical advice.  Specific follow-up / call-back criteria were given for patient to follow-up or seek medical care more urgently if needed.   - Time spent in direct consultation with patient on phone: 11 minutes  [name], [facility] 08/02/2019, 2:51 PM

## 2019-08-10 DIAGNOSIS — H5212 Myopia, left eye: Secondary | ICD-10-CM | POA: Diagnosis not present

## 2019-08-11 DIAGNOSIS — S46011A Strain of muscle(s) and tendon(s) of the rotator cuff of right shoulder, initial encounter: Secondary | ICD-10-CM | POA: Diagnosis not present

## 2019-08-26 ENCOUNTER — Ambulatory Visit: Payer: BC Managed Care – PPO

## 2019-08-30 DIAGNOSIS — M545 Low back pain: Secondary | ICD-10-CM | POA: Diagnosis not present

## 2019-08-30 DIAGNOSIS — M791 Myalgia, unspecified site: Secondary | ICD-10-CM | POA: Diagnosis not present

## 2019-08-30 DIAGNOSIS — M25512 Pain in left shoulder: Secondary | ICD-10-CM | POA: Diagnosis not present

## 2019-08-30 DIAGNOSIS — Z79899 Other long term (current) drug therapy: Secondary | ICD-10-CM | POA: Diagnosis not present

## 2019-08-31 DIAGNOSIS — S46011A Strain of muscle(s) and tendon(s) of the rotator cuff of right shoulder, initial encounter: Secondary | ICD-10-CM | POA: Diagnosis not present

## 2019-09-01 ENCOUNTER — Ambulatory Visit: Payer: BC Managed Care – PPO

## 2019-09-07 DIAGNOSIS — S46011D Strain of muscle(s) and tendon(s) of the rotator cuff of right shoulder, subsequent encounter: Secondary | ICD-10-CM | POA: Diagnosis not present

## 2019-09-08 ENCOUNTER — Encounter: Payer: Self-pay | Admitting: Family Medicine

## 2019-09-08 ENCOUNTER — Ambulatory Visit (INDEPENDENT_AMBULATORY_CARE_PROVIDER_SITE_OTHER): Payer: Medicare Other | Admitting: Family Medicine

## 2019-09-08 ENCOUNTER — Other Ambulatory Visit: Payer: Self-pay

## 2019-09-08 DIAGNOSIS — E559 Vitamin D deficiency, unspecified: Secondary | ICD-10-CM | POA: Diagnosis not present

## 2019-09-08 DIAGNOSIS — B370 Candidal stomatitis: Secondary | ICD-10-CM

## 2019-09-08 DIAGNOSIS — R1013 Epigastric pain: Secondary | ICD-10-CM | POA: Diagnosis not present

## 2019-09-08 MED ORDER — MAGIC MOUTHWASH W/LIDOCAINE: ORAL | 0 refills | Status: DC

## 2019-09-08 MED ORDER — VITAMIN D (ERGOCALCIFEROL) 1.25 MG (50000 UNIT) PO CAPS: 50000.0000 [IU] | ORAL_CAPSULE | ORAL | 0 refills | Status: DC

## 2019-09-08 MED ORDER — OMEPRAZOLE 20 MG PO CPDR: 20.0000 mg | DELAYED_RELEASE_CAPSULE | Freq: Every day | ORAL | 2 refills | Status: DC

## 2019-09-08 MED ORDER — FLUCONAZOLE 200 MG PO TABS: 200.0000 mg | ORAL_TABLET | ORAL | 2 refills | Status: DC | PRN

## 2019-09-08 NOTE — Progress Notes (Signed)
Virtual Visit via Telephone The purpose of this virtual visit is to provide medical care while limiting exposure to the novel coronavirus (COVID19) for both patient and office staff.  Consent was obtained for phone visit:  Yes.   Answered questions that patient had about telehealth interaction:  Yes.   I discussed the limitations, risks, security and privacy concerns of performing an evaluation and management service by telephone. I also discussed with the patient that there may be a patient responsible charge related to this service. The patient expressed understanding and agreed to proceed.  Patient Location: Home Provider Location: [facility])  ---------------------------------------------------------------------- Chief Complaint  Patient presents with  . Medication Refill  . Vitamin D Deficiency  . Gastroesophageal Reflux    S: Reviewed CMA documentation. I have called patient and gathered additional HPI as follows:  Vitamin D Deficiency / Calcium Followed by Pain Management, and she was recommended to start Vitamin D therapy. She was advised to take calcium as well and have it re-checked after treatment. She started OTC calcium, not sure how much is in her diet.  GERD Reports dyspepsia symptoms acid reflux after meals at times, worse. Tried OTC Omeprazole 20mg  with good results. Denies dark stool, blood in stool  Recurrent Oral Thrush / Yeast infection Improved in past on Diflucan, request repeat course and [employer] Magic Mouthwash re order. Uses steroid medication causes it to provoke for her.  Denies any high risk travel to areas of current concern for COVID19. Denies any known or suspected exposure to person with or possibly with COVID19.  Denies any fevers, chills, sweats, body ache, cough, shortness of breath, sinus pain or pressure, headache, abdominal pain, diarrhea  Past Medical History:  Diagnosis Date  . Atypical chest pain    a. 2015 Cath  ([place]): Nl cors. Nl EF; b. 10/2018 MV: No ischemia/infarct. EF>65%.  . Cancer ([place]) 01/2016   basal cell   . Emphysema of lung ([place])   . Palpitations    a. 07/2018 Echo: EF 60-65%, no rwma, Gr1 DD, Nl RV fxn; b. 07/2018 48h Holter: predominantly RSR, avg rate 106 (69-166 bpm), Rare PAC's and occas PVC's. 2 atrial runs up to 5 beats. No sustained arrhythmias or prolonged pauses. No symptoms reported.  . Wears dentures    full upper and lower   Social History   Tobacco Use  . Smoking status: Former Smoker    Packs/day: 0.25    Years: 30.00    Pack years: 7.50    Types: Cigarettes    Quit date: 03/02/2018    Years since quitting: 1.5  . Smokeless tobacco: Former Systems developer  . Tobacco comment: 0.5 to 1 ppd >30 years  Substance Use Topics  . Alcohol use: No  . Drug use: No    Current Outpatient Medications:  .  budesonide (PULMICORT) 0.5 MG/2ML nebulizer solution, Take 2 mLs (0.5 mg total) by nebulization 2 (two) times daily. (Patient taking differently: Take 0.5 mg by nebulization 4 (four) times daily. ), Disp: 120 mL, Rfl: 11 .  fluconazole (DIFLUCAN) 200 MG tablet, Take 1 tablet (200 mg total) by mouth every other day as needed., Disp: 5 tablet, Rfl: 2 .  oxyCODONE-acetaminophen (PERCOCET) 10-325 MG tablet, oxycodone-acetaminophen 10 mg-325 mg tablet  1 QIDPRN Fill on 07/30/2017, Disp: , Rfl:  .  roflumilast (DALIRESP) 500 MCG TABS tablet, Take 1 tablet (500 mcg total) by mouth daily., Disp: 30 tablet, Rfl: 3 .  SYMBICORT 160-4.5 MCG/ACT inhaler, INHALE 2 PUFFS  BY MOUTH TWICE DAILY, Disp: 10.2 g, Rfl: 2 .  Tiotropium Bromide Monohydrate (SPIRIVA RESPIMAT) 1.25 MCG/ACT AERS, Inhale 2 puffs into the lungs 2 (two) times daily., Disp: 1 Inhaler, Rfl: 5 .  VENTOLIN HFA 108 (90 Base) MCG/ACT inhaler, INHALE 2 PUFFS BY MOUTH EVERY 6 HOURS ASNEEDED WHEEZING/ SHORTNESS OFBREATH, Disp: 18 g, Rfl: 3 .  magic mouthwash w/lidocaine SOLN, Swish, gargle, and spit one to two teaspoonfuls every six hours as  needed. Shake well before using., Disp: 120 mL, Rfl: 0 .  omeprazole (PRILOSEC) 20 MG capsule, Take 1 capsule (20 mg total) by mouth daily before breakfast., Disp: 30 capsule, Rfl: 2 .  Vitamin D, Ergocalciferol, (DRISDOL) 1.25 MG (50000 UT) CAPS capsule, Take 1 capsule (50,000 Units total) by mouth once a week. For 12 weeks. Then start OTC Vitamin D3 2,000 unit daily., Disp: 12 capsule, Rfl: 0  Depression screen [facility] 2/9 09/08/2019 05/19/2019 03/30/2019  Decreased Interest 0 0 0  Down, Depressed, Hopeless 0 0 0  PHQ - 2 Score 0 0 0    No flowsheet data found.  -------------------------------------------------------------------------- O: No physical exam performed due to remote telephone encounter.  Lab results reviewed.  No results found for this or any previous visit (from the past 2160 hour(s)).  -------------------------------------------------------------------------- A&P:  Problem List Items Addressed This Visit    Dyspepsia Clinically classic GERD symptoms based on history Improved on PPI May be multiple factors Can improve diet limit trigger foods Start Omeprazole 20mg  daily before breakfast for 1-3 months can taper down - consider longer use if need F/u results    Relevant Medications   omeprazole (PRILOSEC) 20 MG capsule    Other Visit Diagnoses    Vitamin D deficiency    -  Primary In setting of deficiency and pain management requested replacement treatment  Start Vit D3 50k weekly for 12 weeks then down to OTC Vit D3 2k daily for maintenance  Return 3 months check Vit D    Relevant Medications   Vitamin D, Ergocalciferol, (DRISDOL) 1.25 MG (50000 UT) CAPS capsule   Oral yeast infection     Recurrent flare Re order her typical regimen Diflucan and [facility] Magic Mouthwash - Faxed printed mouthwash rx to [employer] court   Relevant Medications   fluconazole (DIFLUCAN) 200 MG tablet   magic mouthwash w/lidocaine SOLN        Meds ordered this encounter   Medications  . Vitamin D, Ergocalciferol, (DRISDOL) 1.25 MG (50000 UT) CAPS capsule    Sig: Take 1 capsule (50,000 Units total) by mouth once a week. For 12 weeks. Then start OTC Vitamin D3 2,000 unit daily.    Dispense:  12 capsule    Refill:  0  . fluconazole (DIFLUCAN) 200 MG tablet    Sig: Take 1 tablet (200 mg total) by mouth every other day as needed.    Dispense:  5 tablet    Refill:  2  . DISCONTD: magic mouthwash w/lidocaine SOLN    Sig: Swish, gargle, and spit one to two teaspoonfuls every six hours as needed. Shake well before using.    Dispense:  120 mL    Refill:  0    1 Part viscous lidocaine 2%  1 Part Maalox  1 Part diphenhydramine 12.5 mg per 5 ml elixir 1 Part Nystatin  . omeprazole (PRILOSEC) 20 MG capsule    Sig: Take 1 capsule (20 mg total) by mouth daily before breakfast.    Dispense:  30 capsule  Refill:  2  . magic mouthwash w/lidocaine SOLN    Sig: Swish, gargle, and spit one to two teaspoonfuls every six hours as needed. Shake well before using.    Dispense:  120 mL    Refill:  0    1 Part viscous lidocaine 2%  1 Part Maalox  1 Part diphenhydramine 12.5 mg per 5 ml elixir 1 Part Nystatin    Follow-up: - Return in 3 months for Vitamin D test  Patient verbalizes understanding with the above medical recommendations including the limitation of remote medical advice.  Specific follow-up and call-back criteria were given for patient to follow-up or seek medical care more urgently if needed.   - Time spent in direct consultation with patient on phone: 12 minutes  [name], [facility] 09/08/2019, 1:46 PM

## 2019-09-08 NOTE — Patient Instructions (Addendum)
Start rx Vitamin D3 50,000 iu daily for 12 weeks then reduce to OTC Vitamin D3 2,000 iu daily for maintenance  We can re-check vitamin D in 3 months  Continue omeprazole before breakfast daily for 1-3 months, try to TAPER OFF of it slowly when ready to stop, it can cause rebound flare up if you stop cold [place]  Please schedule a Follow-up Appointment to: Return in about 3 months (around 12/08/2019) for vitamin d.  If you have any other questions or concerns, please feel free to call the office or send a message through [place]. You may also schedule an earlier appointment if necessary.  Additionally, you may be receiving a survey about your experience at our office within a few days to 1 week by e-mail or mail. We value your feedback.  [name], DO [place]

## 2019-09-09 ENCOUNTER — Ambulatory Visit
Admission: RE | Admit: 2019-09-09 | Discharge: 2019-09-09 | Disposition: A | Discharge status: Home / Self Care | Payer: BC Managed Care – PPO | Source: Ambulatory Visit | Attending: Internal Medicine | Admitting: Internal Medicine

## 2019-09-09 ENCOUNTER — Other Ambulatory Visit: Payer: Self-pay

## 2019-09-09 DIAGNOSIS — J189 Pneumonia, unspecified organism: Secondary | ICD-10-CM | POA: Diagnosis not present

## 2019-09-09 DIAGNOSIS — J449 Chronic obstructive pulmonary disease, unspecified: Secondary | ICD-10-CM | POA: Insufficient documentation

## 2019-09-09 DIAGNOSIS — J432 Centrilobular emphysema: Secondary | ICD-10-CM | POA: Diagnosis not present

## 2019-09-14 ENCOUNTER — Telehealth: Payer: Self-pay | Admitting: *Deleted

## 2019-09-14 NOTE — Telephone Encounter (Signed)
Refill Request received for Diltiazem 120 mg 24 hr capsule Take 1 capsule by mouth qd. Contacted pt to verify if pt is taking medication due to med not being on current med list.  Awaiting pt to return call.

## 2019-10-05 ENCOUNTER — Encounter: Payer: Self-pay | Admitting: Family Medicine

## 2019-10-05 ENCOUNTER — Other Ambulatory Visit: Payer: Self-pay

## 2019-10-05 ENCOUNTER — Ambulatory Visit (INDEPENDENT_AMBULATORY_CARE_PROVIDER_SITE_OTHER): Payer: Medicare Other | Admitting: Family Medicine

## 2019-10-05 DIAGNOSIS — D229 Melanocytic nevi, unspecified: Secondary | ICD-10-CM

## 2019-10-05 DIAGNOSIS — Z85820 Personal history of malignant melanoma of skin: Secondary | ICD-10-CM | POA: Diagnosis not present

## 2019-10-05 DIAGNOSIS — J431 Panlobular emphysema: Secondary | ICD-10-CM

## 2019-10-05 DIAGNOSIS — J441 Chronic obstructive pulmonary disease with (acute) exacerbation: Secondary | ICD-10-CM | POA: Diagnosis not present

## 2019-10-05 MED ORDER — PREDNISONE 20 MG PO TABS: ORAL_TABLET | ORAL | 0 refills | Status: DC

## 2019-10-05 MED ORDER — LEVOFLOXACIN 500 MG PO TABS: 500.0000 mg | ORAL_TABLET | Freq: Every day | ORAL | 0 refills | Status: DC

## 2019-10-05 NOTE — Patient Instructions (Addendum)
Referral sent to Dr Evorn Gong  Levaquin and Prednisone For COPD  Please schedule a Follow-up Appointment to: Return in about 1 week (around 10/12/2019), or if symptoms worsen or fail to improve, for COPD.  If you have any other questions or concerns, please feel free to call the office or send a message through [place]. You may also schedule an earlier appointment if necessary.  Additionally, you may be receiving a survey about your experience at our office within a few days to 1 week by e-mail or mail. We value your feedback.  [name], DO [place]

## 2019-10-05 NOTE — Progress Notes (Signed)
Virtual Visit via Telephone The purpose of this virtual visit is to provide medical care while limiting exposure to the novel coronavirus (COVID19) for both patient and office staff.  Consent was obtained for phone visit:  Yes.   Answered questions that patient had about telehealth interaction:  Yes.   I discussed the limitations, risks, security and privacy concerns of performing an evaluation and management service by telephone. I also discussed with the patient that there may be a patient responsible charge related to this service. The patient expressed understanding and agreed to proceed.  Patient Location: Home Provider Location: [facility])  ---------------------------------------------------------------------- Chief Complaint  Patient presents with  . Referral    Dermatology - Abnormal moles, history of melanoma  . Shortness of Breath    S: Reviewed CMA documentation. I have called patient and gathered additional HPI as follows:  History of Melanoma / Abnormal Nevi with changes Reports that symptoms started 2.5 months ago, with several abnormal moles, on arm and back, some areas with darker and discoloration, requesting return to Dr Evorn Gong at [facility] for melanoma surveillance and skin biopsy, already has apt scheduled for tomorrow 10/06/19. Requesting medicaid referral today.   Additional complaint:  Chest Tightness / Shortness of Breath/ Productive Cough Reports onset symptoms for past 4 days with productive yellow sputum, shortness of breath, worsening, with cough. No other sick contacts or any other concerns. Last flare up 2 months ago with treated Prednisone and Doxycycline. Patient says in past levaquin effective. Pulmonology did CT imaging 1 month ago.  Remains in isolation Denies any high risk travel to areas of current concern for COVID19. Denies any known or suspected exposure to person with or possibly with COVID19.  Denies any  fevers, chills, sweats, body ache, sinus pain or pressure, headache, abdominal pain, diarrhea  Past Medical History:  Diagnosis Date  . Atypical chest pain    a. 2015 Cath ([place]): Nl cors. Nl EF; b. 10/2018 MV: No ischemia/infarct. EF>65%.  . Cancer ([place]) 01/2016   basal cell   . Emphysema of lung ([place])   . Palpitations    a. 07/2018 Echo: EF 60-65%, no rwma, Gr1 DD, Nl RV fxn; b. 07/2018 48h Holter: predominantly RSR, avg rate 106 (69-166 bpm), Rare PAC's and occas PVC's. 2 atrial runs up to 5 beats. No sustained arrhythmias or prolonged pauses. No symptoms reported.  . Wears dentures    full upper and lower   Social History   Tobacco Use  . Smoking status: Former Smoker    Packs/day: 0.25    Years: 30.00    Pack years: 7.50    Types: Cigarettes    Quit date: 03/02/2018    Years since quitting: 1.5  . Smokeless tobacco: Former Systems developer  . Tobacco comment: 0.5 to 1 ppd >30 years  Substance Use Topics  . Alcohol use: No  . Drug use: No    Current Outpatient Medications:  .  fluconazole (DIFLUCAN) 200 MG tablet, Take 1 tablet (200 mg total) by mouth every other day as needed., Disp: 5 tablet, Rfl: 2 .  magic mouthwash w/lidocaine SOLN, Swish, gargle, and spit one to two teaspoonfuls every six hours as needed. Shake well before using., Disp: 120 mL, Rfl: 0 .  omeprazole (PRILOSEC) 20 MG capsule, Take 1 capsule (20 mg total) by mouth daily before breakfast., Disp: 30 capsule, Rfl: 2 .  oxyCODONE-acetaminophen (PERCOCET) 10-325 MG tablet, oxycodone-acetaminophen 10 mg-325 mg tablet  1 QIDPRN Fill on 07/30/2017, Disp: ,  Rfl:  .  roflumilast (DALIRESP) 500 MCG TABS tablet, Take 1 tablet (500 mcg total) by mouth daily., Disp: 30 tablet, Rfl: 3 .  SYMBICORT 160-4.5 MCG/ACT inhaler, INHALE 2 PUFFS BY MOUTH TWICE DAILY, Disp: 10.2 g, Rfl: 2 .  Tiotropium Bromide Monohydrate (SPIRIVA RESPIMAT) 1.25 MCG/ACT AERS, Inhale 2 puffs into the lungs 2 (two) times daily., Disp: 1 Inhaler, Rfl: 5 .   VENTOLIN HFA 108 (90 Base) MCG/ACT inhaler, INHALE 2 PUFFS BY MOUTH EVERY 6 HOURS ASNEEDED WHEEZING/ SHORTNESS OFBREATH, Disp: 18 g, Rfl: 3 .  Vitamin D, Ergocalciferol, (DRISDOL) 1.25 MG (50000 UT) CAPS capsule, Take 1 capsule (50,000 Units total) by mouth once a week. For 12 weeks. Then start OTC Vitamin D3 2,000 unit daily., Disp: 12 capsule, Rfl: 0 .  budesonide (PULMICORT) 0.5 MG/2ML nebulizer solution, Take 2 mLs (0.5 mg total) by nebulization 2 (two) times daily. (Patient taking differently: Take 0.5 mg by nebulization 4 (four) times daily. ), Disp: 120 mL, Rfl: 11 .  levofloxacin (LEVAQUIN) 500 MG tablet, Take 1 tablet (500 mg total) by mouth daily. For 7 days, Disp: 7 tablet, Rfl: 0 .  predniSONE (DELTASONE) 20 MG tablet, Take daily with food. Start with 60mg  (3 pills) x 2 days, then reduce to 40mg  (2 pills) x 3 days, then 20mg  (1 pill) x 2 days, Disp: 14 tablet, Rfl: 0  Depression screen [facility] 2/9 10/05/2019 09/08/2019 05/19/2019  Decreased Interest 0 0 0  Down, Depressed, Hopeless 0 0 0  PHQ - 2 Score 0 0 0  Altered sleeping 0 - -  Tired, decreased energy 0 - -  Change in appetite 0 - -  Feeling bad or failure about yourself  0 - -  Trouble concentrating 0 - -  Moving slowly or fidgety/restless 0 - -  Suicidal thoughts 0 - -  PHQ-9 Score 0 - -    No flowsheet data found.  -------------------------------------------------------------------------- O: No physical exam performed due to remote telephone encounter.  Lab results reviewed.  I have personally reviewed the radiology report CT.  CT CHEST WO CONTRAST [id number] Resulted: 09/10/19 0902  Order Status: Completed Updated: 09/10/19 0904  Narrative:   CLINICAL DATA: Pneumonia   EXAM:  CT CHEST WITHOUT CONTRAST   TECHNIQUE:  Multidetector CT imaging of the chest was performed following the  standard protocol without IV contrast.   COMPARISON: 05/22/2018   FINDINGS:  Cardiovascular: Minimal aortic atherosclerosis.  Incidental note of  aberrant retroesophageal origin of the right subclavian artery.  Normal heart size. No pericardial effusion.   Mediastinum/Nodes: No enlarged mediastinal, hilar, or axillary lymph  nodes. Thyroid gland, trachea, and esophagus demonstrate no  significant findings.   Lungs/Pleura: Moderate centrilobular emphysema. Diffuse bilateral  bronchial wall thickening and mucous plugging, particularly in the  lower lobes. Bandlike scarring or atelectasis of the lingula. Benign  calcified pulmonary nodule in the dependent left lung base. No  pleural effusion or pneumothorax.   Upper Abdomen: No acute abnormality.   Musculoskeletal: No chest wall mass or suspicious bone lesions  identified.   IMPRESSION:  1. Moderate centrilobular emphysema. Emphysema (ICD10-J43.9).   2. Diffuse bilateral bronchial wall thickening and mucous plugging,  particularly in the lower lobes, consistent with nonspecific  infectious or inflammatory bronchitis.   3. Aortic Atherosclerosis (ICD10-I70.0).    Electronically Signed  By: Eddie Candle M.D.  On: 09/10/2019 09:02      No results found for this or any previous visit (from the past 2160 hour(s)).  -------------------------------------------------------------------------- A&P:  Problem List Items Addressed This Visit    Personal history of malignant melanoma   Relevant Orders   Ambulatory referral to Dermatology   Panlobular emphysema ([place])   Relevant Medications   levofloxacin (LEVAQUIN) 500 MG tablet   predniSONE (DELTASONE) 20 MG tablet    Other Visit Diagnoses    Acute exacerbation of chronic obstructive pulmonary disease (COPD) ([place])    -  Primary   Relevant Medications   levofloxacin (LEVAQUIN) 500 MG tablet   predniSONE (DELTASONE) 20 MG tablet   Change in multiple nevi       Relevant Orders   Ambulatory referral to Dermatology      #Dermatology / History melanoma / changing nevi Concern with known personal  history of melanoma, s/p treatment previously, now with some changes in skin with moles over past 2 months, agree with return to Dermatology - Referral placed for [facility] Dr Evorn Gong, already scheduled for tomorrow 10/21 - we will expedite to get her medicaid authorization today  # ACUTE COPD EXAC Clinically with similar to previous flare acute COPD exacerbation, worsening productive cough and dyspnea Advanced COPD, seems to have persistent recurrent flares - Continues on maintenance therapy inhalers/nebs - failed daliresp Recent CT imaging 08/2019 Followed by Dr Karilyn Cota  Plan Start Levaquin 500 daily x 7 days Start Prednisone 7 day course If need to add on 3 additional days max 10 day prednisone we could do that Has cough medicine Has diflucan PRN F/u as planned / advised  Meds ordered this encounter  Medications  . levofloxacin (LEVAQUIN) 500 MG tablet    Sig: Take 1 tablet (500 mg total) by mouth daily. For 7 days    Dispense:  7 tablet    Refill:  0  . predniSONE (DELTASONE) 20 MG tablet    Sig: Take daily with food. Start with 60mg  (3 pills) x 2 days, then reduce to 40mg  (2 pills) x 3 days, then 20mg  (1 pill) x 2 days    Dispense:  14 tablet    Refill:  0    Follow-up: - Return as scheduled  Patient verbalizes understanding with the above medical recommendations including the limitation of remote medical advice.  Specific follow-up and call-back criteria were given for patient to follow-up or seek medical care more urgently if needed.   - Time spent in direct consultation with patient on phone: 12 minutes  [name], [facility] 10/05/2019, 9:54 AM

## 2019-11-06 ENCOUNTER — Other Ambulatory Visit: Payer: Self-pay | Admitting: Family Medicine

## 2019-11-06 DIAGNOSIS — B370 Candidal stomatitis: Secondary | ICD-10-CM

## 2019-11-24 ENCOUNTER — Encounter: Payer: Self-pay | Admitting: Family Medicine

## 2019-11-24 ENCOUNTER — Ambulatory Visit (INDEPENDENT_AMBULATORY_CARE_PROVIDER_SITE_OTHER): Payer: Medicare Other | Admitting: Family Medicine

## 2019-11-24 ENCOUNTER — Other Ambulatory Visit: Payer: Self-pay

## 2019-11-24 DIAGNOSIS — J441 Chronic obstructive pulmonary disease with (acute) exacerbation: Secondary | ICD-10-CM

## 2019-11-24 DIAGNOSIS — H8110 Benign paroxysmal vertigo, unspecified ear: Secondary | ICD-10-CM

## 2019-11-24 MED ORDER — PULSE OXIMETER FOR FINGER MISC: 1.0000 | 0 refills | Status: AC | PRN

## 2019-11-24 MED ORDER — DOXYCYCLINE HYCLATE 100 MG PO TABS: 100.0000 mg | ORAL_TABLET | Freq: Two times a day (BID) | ORAL | 0 refills | Status: DC

## 2019-11-24 MED ORDER — HYDROCOD POLST-CPM POLST ER 10-8 MG/5ML PO SUER: 5.0000 mL | Freq: Two times a day (BID) | ORAL | 0 refills | Status: DC | PRN

## 2019-11-24 MED ORDER — PREDNISONE 20 MG PO TABS: ORAL_TABLET | ORAL | 0 refills | Status: DC

## 2019-11-24 NOTE — Patient Instructions (Addendum)
Start taking Doxycycline antibiotic 100mg  twice daily for 10 days. Take with full glass of water and stay upright for at least 30 min after taking, may be seated or standing, but should NOT lay down. This is just a safety precaution, if this medicine does not go all the way down throat well it could cause some burning discomfort to throat and esophagus.  Start Prednisone up to 10 day taper, can finish at day 7 if need  Refill Tussionex syrup  Attempted to order Pulse Oximeter for finger check o2 level - call or send new request if we need to handwrite the order  1. You have symptoms of Vertigo (Benign Paroxysmal Positional Vertigo) - This is commonly caused by inner ear fluid imbalance, sometimes can be worsened by allergies and sinus symptoms, otherwise it can occur randomly sometimes and we may never discover the exact cause. - To treat this, try the Epley Manuever (see diagrams/instructions below) at home up to 3 times a day for 1-2 weeks or until symptoms resolve - You may take Meclizine as needed up to 3 times a day for dizziness, this will not cure symptoms but may help. Caution may make you drowsy.  If you develop significant worsening episode with vertigo that does not improve and you get severe headache, loss of vision, arm or leg weakness, slurred speech, or other concerning symptoms please seek immediate medical attention at Emergency Department.   See the next page for images describing the Epley Manuever.     ----------------------------------------------------------------------------------------------------------------------        Please schedule a Follow-up Appointment to: Return in about 1 week (around 12/01/2019), or if symptoms worsen or fail to improve, for COPD, Vertigo.  If you have any other questions or concerns, please feel free to call the office or send a message through [place]. You may also schedule an earlier appointment if necessary.  Additionally, you  may be receiving a survey about your experience at our office within a few days to 1 week by e-mail or mail. We value your feedback.  [name], DO [place]

## 2019-11-24 NOTE — Progress Notes (Signed)
Virtual Visit via Telephone The purpose of this virtual visit is to provide medical care while limiting exposure to the novel coronavirus (COVID19) for both patient and office staff.  Consent was obtained for phone visit:  Yes.   Answered questions that patient had about telehealth interaction:  Yes.   I discussed the limitations, risks, security and privacy concerns of performing an evaluation and management service by telephone. I also discussed with the patient that there may be a patient responsible charge related to this service. The patient expressed understanding and agreed to proceed.  Patient Location: Home Provider Location: [facility])  ---------------------------------------------------------------------- Chief Complaint  Patient presents with  . COPD    onset 3 days covid test negative 5 days ago  . Dizziness    throwed up onset 2 days ago only once    S: Reviewed CMA documentation. I have called patient and gathered additional HPI as follows:  ACUTE COPD EXACERBATION / VERTIGO Reports that symptoms started 3 days ago with bad dizzy spell, she fell onto a bed, and felt even more dizzy as she laid there, she did have sudden nausea and vomiting, she had severe spinning dizziness as if in a "spinning room". - Additionally has felt some shortness of breath and cough tightness in chest - Recent COVID19 test negative 5 days ago - Now she still feels fatigued tired and fairly sick with breathing. But still experiencing mild vertigo symptoms with dizziness on quick movements or head movement but no more severe spinning dizziness episodes. - Last AECOPD flare 6 weeks ago approx treated with Levaquin / Prednisone with good results. Followed by Pulmonology.  Denies any high risk travel to areas of current concern for COVID19. Denies any known or suspected exposure to person with or possibly with COVID19.  Admits cough, dyspnea Denies any fevers, chills,  sweats, body ache, sinus pain or pressure, headache, abdominal pain, diarrhea  Past Medical History:  Diagnosis Date  . Atypical chest pain    a. 2015 Cath ([place]): Nl cors. Nl EF; b. 10/2018 MV: No ischemia/infarct. EF>65%.  . Cancer ([place]) 01/2016   basal cell   . Emphysema of lung ([place])   . Palpitations    a. 07/2018 Echo: EF 60-65%, no rwma, Gr1 DD, Nl RV fxn; b. 07/2018 48h Holter: predominantly RSR, avg rate 106 (69-166 bpm), Rare PAC's and occas PVC's. 2 atrial runs up to 5 beats. No sustained arrhythmias or prolonged pauses. No symptoms reported.  . Wears dentures    full upper and lower   Social History   Tobacco Use  . Smoking status: Former Smoker    Packs/day: 0.25    Years: 30.00    Pack years: 7.50    Types: Cigarettes    Quit date: 03/02/2018    Years since quitting: 1.7  . Smokeless tobacco: Former Systems developer  . Tobacco comment: 0.5 to 1 ppd >30 years  Substance Use Topics  . Alcohol use: No  . Drug use: No    Current Outpatient Medications:  .  fluconazole (DIFLUCAN) 200 MG tablet, TAKE 1 TABLET BY MOUTH ONCE DAILY AS NEEDED YEAST MAY REPEAT IN 48 HRS, Disp: 5 tablet, Rfl: 2 .  magic mouthwash w/lidocaine SOLN, Swish, gargle, and spit one to two teaspoonfuls every six hours as needed. Shake well before using., Disp: 120 mL, Rfl: 0 .  omeprazole (PRILOSEC) 20 MG capsule, Take 1 capsule (20 mg total) by mouth daily before breakfast., Disp: 30 capsule, Rfl: 2 .  oxyCODONE-acetaminophen (PERCOCET) 10-325 MG tablet, oxycodone-acetaminophen 10 mg-325 mg tablet  1 QIDPRN Fill on 07/30/2017, Disp: , Rfl:  .  roflumilast (DALIRESP) 500 MCG TABS tablet, Take 1 tablet (500 mcg total) by mouth daily., Disp: 30 tablet, Rfl: 3 .  SYMBICORT 160-4.5 MCG/ACT inhaler, INHALE 2 PUFFS BY MOUTH TWICE DAILY, Disp: 10.2 g, Rfl: 2 .  Tiotropium Bromide Monohydrate (SPIRIVA RESPIMAT) 1.25 MCG/ACT AERS, Inhale 2 puffs into the lungs 2 (two) times daily., Disp: 1 Inhaler, Rfl: 5 .  VENTOLIN HFA  108 (90 Base) MCG/ACT inhaler, INHALE 2 PUFFS BY MOUTH EVERY 6 HOURS ASNEEDED WHEEZING/ SHORTNESS OFBREATH, Disp: 18 g, Rfl: 3 .  Vitamin D, Ergocalciferol, (DRISDOL) 1.25 MG (50000 UT) CAPS capsule, Take 1 capsule (50,000 Units total) by mouth once a week. For 12 weeks. Then start OTC Vitamin D3 2,000 unit daily., Disp: 12 capsule, Rfl: 0 .  budesonide (PULMICORT) 0.5 MG/2ML nebulizer solution, Take 2 mLs (0.5 mg total) by nebulization 2 (two) times daily. (Patient taking differently: Take 0.5 mg by nebulization 4 (four) times daily. ), Disp: 120 mL, Rfl: 11 .  chlorpheniramine-HYDROcodone (TUSSIONEX PENNKINETIC ER) 10-8 MG/5ML SUER, Take 5 mLs by mouth every 12 (twelve) hours as needed for cough., Disp: 140 mL, Rfl: 0 .  doxycycline (VIBRA-TABS) 100 MG tablet, Take 1 tablet (100 mg total) by mouth 2 (two) times daily. For 10 days. Take with full glass of water, stay upright 30 min after taking., Disp: 20 tablet, Rfl: 0 .  Misc. Devices (PULSE OXIMETER FOR FINGER) MISC, 1 Device by Does not apply route as needed (for cough, dyspnea). Pulse oximeter for finger for checking oxygen saturation in COPD, Disp: 1 each, Rfl: 0 .  predniSONE (DELTASONE) 20 MG tablet, Take 2 tablets daily (40mg ) for 4 days, take 1 tab daily (20mg ) for 4 days, take half tab daily (10mg ) for 2 days. Total course 10 day, Disp: 13 tablet, Rfl: 0  Depression screen [facility] 2/9 11/24/2019 10/05/2019 09/08/2019  Decreased Interest 0 0 0  Down, Depressed, Hopeless 0 0 0  PHQ - 2 Score 0 0 0  Altered sleeping 0 0 -  Tired, decreased energy 0 0 -  Change in appetite 0 0 -  Feeling bad or failure about yourself  0 0 -  Trouble concentrating 0 0 -  Moving slowly or fidgety/restless 0 0 -  Suicidal thoughts 0 0 -  PHQ-9 Score 0 0 -    No flowsheet data found.  -------------------------------------------------------------------------- O: No physical exam performed due to remote telephone encounter.  Lab results reviewed.  No results  found for this or any previous visit (from the past 2160 hour(s)).  -------------------------------------------------------------------------- A&P:  Problem List Items Addressed This Visit    None    Visit Diagnoses    COPD with acute exacerbation ([place])    -  Primary   Relevant Medications   predniSONE (DELTASONE) 20 MG tablet   doxycycline (VIBRA-TABS) 100 MG tablet   chlorpheniramine-HYDROcodone (TUSSIONEX PENNKINETIC ER) 10-8 MG/5ML SUER   Misc. Devices (PULSE OXIMETER FOR FINGER) MISC   BPPV (benign paroxysmal positional vertigo), unspecified laterality         # ACUTE COPD EXAC Clinically with similar to previous flare acute COPD exacerbation, worsening productive cough and dyspnea Advanced COPD, seems to have persistent recurrent flares - Continues on maintenance therapy inhalers/nebs- failed daliresp Recent CT imaging 08/2019 Followed by Dr Mortimer Fries [facility] Doxycycline 100 BID x 10 days, alternate from previous levaquin Start Prednisone  7-10 day course Refill Tussionex Has diflucan PRN Ordered e-script for Pulse Oximeter for finger for COPD, sent to pharmacy, can change or update or handwrite order if need in future. F/u as planned / advised   #BPPV Vertigo Reassurance based on history, now seems improving, has residual symptoms with movement/head position - Treat AECOPD - Home Epley maneuver - May try OTC meclizine F/u if not resolving   Meds ordered this encounter  Medications  . predniSONE (DELTASONE) 20 MG tablet    Sig: Take 2 tablets daily (40mg ) for 4 days, take 1 tab daily (20mg ) for 4 days, take half tab daily (10mg ) for 2 days. Total course 10 day    Dispense:  13 tablet    Refill:  0  . doxycycline (VIBRA-TABS) 100 MG tablet    Sig: Take 1 tablet (100 mg total) by mouth 2 (two) times daily. For 10 days. Take with full glass of water, stay upright 30 min after taking.    Dispense:  20 tablet    Refill:  0  . chlorpheniramine-HYDROcodone  (TUSSIONEX PENNKINETIC ER) 10-8 MG/5ML SUER    Sig: Take 5 mLs by mouth every 12 (twelve) hours as needed for cough.    Dispense:  140 mL    Refill:  0  . Misc. Devices (PULSE OXIMETER FOR FINGER) MISC    Sig: 1 Device by Does not apply route as needed (for cough, dyspnea). Pulse oximeter for finger for checking oxygen saturation in COPD    Dispense:  1 each    Refill:  0    Follow-up: - Return in as needed within 1 week   Patient verbalizes understanding with the above medical recommendations including the limitation of remote medical advice.  Specific follow-up and call-back criteria were given for patient to follow-up or seek medical care more urgently if needed.   - Time spent in direct consultation with patient on phone: 12 minutes   [name], [facility] 11/24/2019, 10:45 AM

## 2019-11-29 ENCOUNTER — Telehealth: Payer: Self-pay | Admitting: Family Medicine

## 2019-11-29 DIAGNOSIS — J441 Chronic obstructive pulmonary disease with (acute) exacerbation: Secondary | ICD-10-CM

## 2019-11-29 DIAGNOSIS — Z79899 Other long term (current) drug therapy: Secondary | ICD-10-CM | POA: Diagnosis not present

## 2019-11-29 DIAGNOSIS — M791 Myalgia, unspecified site: Secondary | ICD-10-CM | POA: Diagnosis not present

## 2019-11-29 DIAGNOSIS — M545 Low back pain: Secondary | ICD-10-CM | POA: Diagnosis not present

## 2019-11-29 DIAGNOSIS — M25512 Pain in left shoulder: Secondary | ICD-10-CM | POA: Diagnosis not present

## 2019-11-29 MED ORDER — LEVOFLOXACIN 500 MG PO TABS: 500.0000 mg | ORAL_TABLET | Freq: Every day | ORAL | 0 refills | Status: DC

## 2019-11-29 NOTE — Telephone Encounter (Signed)
I can switch her antibiotic from Doxycycline to Levaquin instead, she can stop Doxy and start Levaquin.  Otherwise she has 10 day of prednisone, should still be taking that, and if needed next step is she can contact Dr Mortimer Fries Pulmonology  Already sent rx Levaquin if she chooses this.  [name], [facility] 11/29/2019, 12:57 PM

## 2019-11-29 NOTE — Telephone Encounter (Signed)
The pt was notified. She verbalize understanding and will pick up the Levaquin from the drug store.

## 2019-11-29 NOTE — Telephone Encounter (Addendum)
Pt said that the Prednisone, and the other  Medication that was prescribe for her was not working, said that she was not better wanted to know if you would call something else in for her

## 2019-12-22 ENCOUNTER — Ambulatory Visit
Admission: RE | Admit: 2019-12-22 | Discharge: 2019-12-22 | Disposition: A | Discharge status: Home / Self Care | Payer: Medicare Other | Source: Ambulatory Visit | Attending: Primary Care | Admitting: Primary Care

## 2019-12-22 ENCOUNTER — Other Ambulatory Visit: Payer: Self-pay

## 2019-12-22 ENCOUNTER — Telehealth (INDEPENDENT_AMBULATORY_CARE_PROVIDER_SITE_OTHER): Payer: BC Managed Care – PPO | Admitting: Primary Care

## 2019-12-22 ENCOUNTER — Other Ambulatory Visit
Admission: RE | Admit: 2019-12-22 | Discharge: 2019-12-22 | Disposition: A | Discharge status: Home / Self Care | Payer: Medicare Other | Source: Ambulatory Visit | Attending: Primary Care | Admitting: Primary Care

## 2019-12-22 ENCOUNTER — Encounter: Payer: Self-pay | Admitting: Primary Care

## 2019-12-22 DIAGNOSIS — J441 Chronic obstructive pulmonary disease with (acute) exacerbation: Secondary | ICD-10-CM

## 2019-12-22 DIAGNOSIS — R0602 Shortness of breath: Secondary | ICD-10-CM | POA: Diagnosis not present

## 2019-12-22 MED ORDER — GUAIFENESIN ER 600 MG PO TB12: 1200.0000 mg | ORAL_TABLET | Freq: Two times a day (BID) | ORAL | 1 refills | Status: DC

## 2019-12-22 MED ORDER — TRELEGY ELLIPTA 100-62.5-25 MCG/INH IN AEPB: 1.0000 | INHALATION_SPRAY | Freq: Every day | RESPIRATORY_TRACT | 6 refills | Status: DC

## 2019-12-22 MED ORDER — ALBUTEROL SULFATE HFA 108 (90 BASE) MCG/ACT IN AERS: 2.0000 | INHALATION_SPRAY | Freq: Four times a day (QID) | RESPIRATORY_TRACT | 2 refills | Status: DC | PRN

## 2019-12-22 MED ORDER — PREDNISONE 10 MG PO TABS: ORAL_TABLET | ORAL | 0 refills | Status: DC

## 2019-12-22 NOTE — Progress Notes (Signed)
Virtual Visit via Video Note  I connected with Caroline Conley on 12/22/19 at 11:45 AM EST by a video enabled telemedicine application and verified that I am speaking with the correct person using two identifiers.  Location: Patient: Home Provider: [place] office   I discussed the limitations of evaluation and management by telemedicine and the availability of in person appointments. The patient expressed understanding and agreed to proceed.  History of Present Illness: 54 year old female, former smoker quit in 2019. PMH severe COPD GOLD D, panlobular emphysema. Patient of Dr. Mortimer Fries, last seen on 06/02/19 and treated for COPD exacerbation with 40mg  prednisone x 10 days and started on Daliresp and Spiriva 1.38mcg. Maintained on Symbicort 160 twice daily.   12/22/2019 Patient contacted today for video visit. Reports that her breathing has not been doing well. Having shortness of breath, wheezing and productive cough with yellow/brown mucus. She was treated with course of doxycycline and prednisone taper 3 weeks ago. She was then prescribed levaquin 500mg  x 7 days on 11/29/19 from her PCP. She reports some improvement with Levaquin. She reports compliance with Symbicort 160, Spiriva and Daliresp. She has been using Albuterol nebulizer upwards of 6 times a day. She typically experiences a flare of her COPD symptoms every 6-8 weeks which is normally treated by her PCP with no issues. States that she tested negative for covid 1.5 weeks ago. No known sick contacts. Denies fever, chills, sweats, chest pain, HA, nasal congestion, N/V/D.  Observations/Objective:  - No significant shortness of breath or wheezing observed - Tight/dry cough   PFT 2017 - Ratio 18%, Fev1 2.2L 74% fef25/75 0.78L 22% moderate obstructive airways disease  Assessment and Plan:  COPD exacerbation - Stop Symbicort/Spiriva; discontinue old prescription for Pulmicort nebulizer - Start Trelegy 100, take one puff once daily (rinse  mouth after use) - Rx prednisone taper (40mg  x 5 days; 20mg  x 5 days; 10mg  x 5 days; stop) - Continue Albuterol (proair) rescue inhaler 2 puffs every 4-6 hours for breakthrough shortness of breath wheezing - Continue Daliresp 500mg  once daily  - Recommend Mucinex 1,200mg  twice daily with full glass of water x2 weeks  - Holding off on further antibiotics at this time  - Recommend checking CXR and sputum culture   Follow Up Instructions:   -2 week follow-up televisit with Beth NP   I discussed the assessment and treatment plan with the patient. The patient was provided an opportunity to ask questions and all were answered. The patient agreed with the plan and demonstrated an understanding of the instructions.   The patient was advised to call back or seek an in-person evaluation if the symptoms worsen or if the condition fails to improve as anticipated.  I provided 30 minutes of non-face-to-face time during this encounter.   Martyn Ehrich, NP

## 2019-12-22 NOTE — Addendum Note (Signed)
Addended by: Maryanna Shape A on: 12/22/2019 03:11 PM   Modules accepted: Orders

## 2019-12-22 NOTE — Patient Instructions (Addendum)
Pleasure speaking with you today Ms Appell  Recommendations: - STOP Symbicort/spiriva - START Trelegy one puff once daily (rinse mouth after use) - Holding off on further antibiotics at this time  - Extended prednisone taper (40mg  x 5 days; 20mg  x 5 days; 10mg  x 5 days; stop) - Take Mucinex 1,20mg  twice daily x 2 weeks for congestion  Orders: - Checking CXR today  - Needs Sputum culture   Follow-up - 2 week televisit with Eustaquio Maize NP

## 2019-12-22 NOTE — Progress Notes (Signed)
Please let patient know CXR was normal. No acute disease, showed emphysema.

## 2019-12-23 ENCOUNTER — Ambulatory Visit: Payer: Medicare Other | Admitting: Internal Medicine

## 2019-12-23 NOTE — Progress Notes (Deleted)
Follow-up Outpatient Visit Date: 12/23/2019  Primary Care Provider: Olin Hauser, DO [place] [address]  Chief Complaint: ***  HPI:  [name] is a 54 y.o. female with history of palpitations, atypical chest pain with clean coronary arteries by LHC in 2015, COPD, prior tobacco use, and basal cell carcinoma, who presents for follow-up of and palpitations.  We last spoke via virtual visit in April, at which time she was most concerned about dizziness consistent with vertigo.  I recommended continued use of meclizine and follow-up with her PCP.  Palpitations were well controlled with low-dose diltiazem.  --------------------------------------------------------------------------------------------------  Cardiovascular History & Procedures: Cardiovascular Problems:  Tachycardia  Atypical chest pain  Risk Factors:  Tobacco use  Cath/PCI:  LHC (2015): Report not available.  Images personally reviewed, demonstrating no significant CAD and normal LVEF.  CV Surgery:  None  EP Procedures and Devices:  14-day event monitor (03/04/2019): Limited study due to significant artifact.  Usable tracings show predominantly sinus rhythm with rare PAC's and PVC's.  Two brief atrial runs noted (up to 5 beats).  48-hour Holter monitor (08/06/2018): Predominantly sinus rhythm with rare PAC's and PVC's, as well as brief atrial runs.  Average rate during the monitoring period was elevated (106 bpm).  Non-Invasive Evaluation(s):  TTE (08/04/2018): Normal LV size and wall thickness.  LVEF 60-65% with normal wall motion.  Grade 1 diastolic dysfunction.  Normal RV size and function.  No significant valvular abnormality.  Recent CV Pertinent Labs: Lab Results  Component Value Date   CHOL 169 07/29/2017   HDL 35 (L) 07/29/2017   LDLCALC 85 07/29/2017   TRIG 243 (H) 07/29/2017   CHOLHDL 4.8 07/29/2017   INR 0.9 10/09/2018   K 4.3 12/31/2018   K 3.6 09/23/2014   MG 1.8  12/31/2018   BUN 11 12/31/2018   BUN 5 (L) 09/23/2014   CREATININE 1.16 (H) 12/31/2018    Past medical and surgical history were reviewed and updated in EPIC.  No outpatient medications have been marked as taking for the 12/23/19 encounter (Appointment) with Caroline Conley.    Allergies: 2,4-d dimethylamine (amisol); Morphine and related; and Vicodin [hydrocodone-acetaminophen]  Social History   Tobacco Use  . Smoking status: Former Smoker    Packs/day: 1.00    Years: 30.00    Pack years: 30.00    Types: Cigarettes    Quit date: 03/02/2018    Years since quitting: 1.8  . Smokeless tobacco: Former Systems developer  . Tobacco comment: 0.5 to 1 ppd >30 years  Substance Use Topics  . Alcohol use: No  . Drug use: No    Family History  Problem Relation Age of Onset  . Diabetes Mother   . High blood pressure Mother   . Gout Mother   . Cancer Father   . Stroke Father   . Arthritis/Rheumatoid Father   . Diabetes Father   . High blood pressure Father   . Heart failure Father   . Heart failure Brother   . Diabetes Brother     Review of Systems: A 12-system review of systems was performed and was negative except as noted in the HPI.  --------------------------------------------------------------------------------------------------  Physical Exam: There were no vitals taken for this visit.  General:  *** HEENT: No conjunctival pallor or scleral icterus. Facemask in place. Neck: Supple without lymphadenopathy, thyromegaly, JVD, or HJR. Lungs: Normal work of breathing. Clear to auscultation bilaterally without wheezes or crackles. Heart: Regular rate and rhythm without  murmurs, rubs, or gallops. Non-displaced PMI. Abd: Bowel sounds present. Soft, NT/ND without hepatosplenomegaly Ext: No lower extremity edema. Radial, PT, and DP pulses are 2+ bilaterally. Skin: Warm and dry without rash.  EKG:  ***  Lab Results  Component Value Date   WBC 18.7 (H) 10/09/2018   HGB 13.1  10/09/2018   HCT 37.4 10/09/2018   MCV 92.6 10/09/2018   PLT 324 10/09/2018    Lab Results  Component Value Date   NA 134 (L) 12/31/2018   K 4.3 12/31/2018   CL 100 12/31/2018   CO2 27 12/31/2018   BUN 11 12/31/2018   CREATININE 1.16 (H) 12/31/2018   GLUCOSE 109 (H) 12/31/2018   ALT 14 05/22/2018    Lab Results  Component Value Date   CHOL 169 07/29/2017   HDL 35 (L) 07/29/2017   LDLCALC 85 07/29/2017   TRIG 243 (H) 07/29/2017   CHOLHDL 4.8 07/29/2017    --------------------------------------------------------------------------------------------------  ASSESSMENT AND PLAN: Caroline Conley Caroline Salvi, Conley 12/23/2019 6:42 AM

## 2019-12-30 NOTE — Progress Notes (Signed)
Follow-up Outpatient Visit Date: 12/31/2019  Primary Care Provider: Olin Hauser, DO [place] [address]  Chief Complaint: Shortness of breath and palpitations  HPI:  Caroline Conley is a 54 y.o. female with history of palpitations, atypical chest pain with clean coronary arteries by LHC in 2015, COPD, prior tobacco use, and basal cell carcinoma, who presents for follow-up of and palpitations.  We last spoke via virtual visit in April, at which time she was most concerned about dizziness consistent with vertigo.  I recommended continued use of meclizine and follow-up with her PCP.  Palpitations were well controlled with low-dose diltiazem.  Today, Caroline Conley reports that her palpitations have been more frequent and severe over the last few weeks.  She also notes that her shortness of breath and cough have been worse.  She is now on her second course of prednisone in the last 3 weeks.  She denies chest pain.  She has experienced 3 pillow orthopnea over the last 4-5 months but denies edema.  She feels like diltiazem had been helping her palpitations, but is not doing as much lately.  She has continued to have intermittent dizziness that she thought was vertigo.  However, ENT did not identify a cause for her symptoms.  She reports one episode of marked dizziness after bending over during which she fell backwards and luckily landed on a bed.  She has not passed out and is trying to stay well-hydrated.  --------------------------------------------------------------------------------------------------  Cardiovascular History & Procedures: Cardiovascular Problems:  Tachycardia and palpitations  Atypical chest pain  Risk Factors:  Tobacco use  Cath/PCI:  LHC (2015): Report not available.  Images personally reviewed, demonstrating no significant CAD and normal LVEF.  CV Surgery:  None  EP Procedures and Devices:  14-day event monitor (03/04/2019): Limited study due  to significant artifact.  Usable tracings show predominantly sinus rhythm with rare PAC's and PVC's.  Two brief atrial runs noted (up to 5 beats).  48-hour Holter monitor (08/06/2018): Predominantly sinus rhythm with rare PAC's and PVC's, as well as brief atrial runs.  Average rate during the monitoring period was elevated (106 bpm).  Non-Invasive Evaluation(s):  TTE (08/04/2018): Normal LV size and wall thickness.  LVEF 60-65% with normal wall motion.  Grade 1 diastolic dysfunction.  Normal RV size and function.  No significant valvular abnormality.  Recent CV Pertinent Labs: Lab Results  Component Value Date   CHOL 169 07/29/2017   HDL 35 (L) 07/29/2017   LDLCALC 85 07/29/2017   TRIG 243 (H) 07/29/2017   CHOLHDL 4.8 07/29/2017   INR 0.9 10/09/2018   K 4.3 12/31/2018   K 3.6 09/23/2014   MG 1.8 12/31/2018   BUN 11 12/31/2018   BUN 5 (L) 09/23/2014   CREATININE 1.16 (H) 12/31/2018    Past medical and surgical history were reviewed and updated in EPIC.  Current Meds  Medication Sig  . albuterol (PROAIR HFA) 108 (90 Base) MCG/ACT inhaler Inhale 2 puffs into the lungs every 6 (six) hours as needed for wheezing or shortness of breath.  Marland Kitchen albuterol (PROVENTIL) (2.5 MG/3ML) 0.083% nebulizer solution Take 2.5 mg by nebulization every 6 (six) hours as needed.  . Fluticasone-Umeclidin-Vilant (TRELEGY ELLIPTA) 100-62.5-25 MCG/INH AEPB Inhale 1 puff into the lungs daily.  Marland Kitchen guaiFENesin (MUCINEX) 600 MG 12 hr tablet Take 2 tablets (1,200 mg total) by mouth 2 (two) times daily.  . magic mouthwash w/lidocaine SOLN Swish, gargle, and spit one to two teaspoonfuls every six hours  as needed. Shake well before using.  . Misc. Devices (PULSE OXIMETER FOR FINGER) MISC 1 Device by Does not apply route as needed (for cough, dyspnea). Pulse oximeter for finger for checking oxygen saturation in COPD  . omeprazole (PRILOSEC) 20 MG capsule Take 1 capsule (20 mg total) by mouth daily before breakfast.  .  oxyCODONE-acetaminophen (PERCOCET) 10-325 MG tablet oxycodone-acetaminophen 10 mg-325 mg tablet  1 QIDPRN Fill on 07/30/2017  . predniSONE (DELTASONE) 10 MG tablet Take 4 tabs po daily x 5 days; then 2 tabs daily x5 days; then 1 tab daily x 5 days; then stop  . roflumilast (DALIRESP) 500 MCG TABS tablet Take 1 tablet (500 mcg total) by mouth daily.    Allergies: 2,4-d dimethylamine (amisol); Morphine and related; and Vicodin [hydrocodone-acetaminophen]  Social History   Tobacco Use  . Smoking status: Former Smoker    Packs/day: 1.00    Years: 30.00    Pack years: 30.00    Types: Cigarettes    Quit date: 03/02/2018    Years since quitting: 1.8  . Smokeless tobacco: Former Systems developer  . Tobacco comment: 0.5 to 1 ppd >30 years  Substance Use Topics  . Alcohol use: No  . Drug use: No    Family History  Problem Relation Age of Onset  . Diabetes Mother   . High blood pressure Mother   . Gout Mother   . Cancer Father   . Stroke Father   . Arthritis/Rheumatoid Father   . Diabetes Father   . High blood pressure Father   . Heart failure Father   . Heart failure Brother   . Diabetes Brother     Review of Systems: A 12-system review of systems was performed and was negative except as noted in the HPI.  --------------------------------------------------------------------------------------------------  Physical Exam: BP 140/80 (BP Location: Left Arm, Patient Position: Sitting, Cuff Size: Normal)   Pulse (!) 117   Ht 5\' 6"  (1.676 m)   Wt 130 lb 8 oz (59.2 kg)   SpO2 95%   BMI 21.06 kg/m   General:  NAD. HEENT: No conjunctival pallor or scleral icterus. Facemask in place. Neck: Supple without lymphadenopathy, thyromegaly, JVD, or HJR. Lungs: Normal work of breathing. Diminished breath sounds throughout with expiratory wheezing in both lung bases. Heart: Tachycardic but regular without murmurs, rubs, or gallops. Abd: Bowel sounds present. Soft, NT/ND without  hepatosplenomegaly Ext: No lower extremity edema. Radial, PT, and DP pulses are 2+ bilaterally. Skin: Warm and dry without rash.  EKG:  Sinus tachycardia (HR 117 bpm) with left atrial enlargement.  Lab Results  Component Value Date   WBC 18.7 (H) 10/09/2018   HGB 13.1 10/09/2018   HCT 37.4 10/09/2018   MCV 92.6 10/09/2018   PLT 324 10/09/2018    Lab Results  Component Value Date   NA 134 (L) 12/31/2018   K 4.3 12/31/2018   CL 100 12/31/2018   CO2 27 12/31/2018   BUN 11 12/31/2018   CREATININE 1.16 (H) 12/31/2018   GLUCOSE 109 (H) 12/31/2018   ALT 14 05/22/2018    Lab Results  Component Value Date   CHOL 169 07/29/2017   HDL 35 (L) 07/29/2017   LDLCALC 85 07/29/2017   TRIG 243 (H) 07/29/2017   CHOLHDL 4.8 07/29/2017    --------------------------------------------------------------------------------------------------  ASSESSMENT AND PLAN: Palpitations and sinus tachycardia: Longstanding and likely exacerbated by recent respiratory decompensation and corticosteroids.  Given some improvement in the past with diltiazem, we have agreed to increase this to 180  mg daily.  Lightheadedness: Likely multifactorial, though tachycardia may be contributing.  As above, we will carefully increase diltiazem to 180 mg daily.  I encouraged Caroline Conley to stay well-hydrated.  Shortness of breath and COPD: Likely driven by underlying COPD.  Echo in 07/2018 showed normal LVEF with grade 1 diastolic dysfunction.  PA pressure could not be estimated.  Caroline Conley appears euvolemic on exam today.  I recommend continued COPD treatment and close follow-up with Dr. Mortimer Fries.  No plans for further cardiac testing at this time.  Follow-up: Return to clinic with APP in 3 months.  [name], MD 12/31/2019 3:17 PM

## 2019-12-31 ENCOUNTER — Ambulatory Visit (INDEPENDENT_AMBULATORY_CARE_PROVIDER_SITE_OTHER): Payer: BC Managed Care – PPO | Admitting: Internal Medicine

## 2019-12-31 ENCOUNTER — Other Ambulatory Visit: Payer: Self-pay

## 2019-12-31 ENCOUNTER — Encounter: Payer: Self-pay | Admitting: Internal Medicine

## 2019-12-31 VITALS — BP 140/80 | HR 117 | Ht 66.0 in | Wt 130.5 lb

## 2019-12-31 DIAGNOSIS — J449 Chronic obstructive pulmonary disease, unspecified: Secondary | ICD-10-CM

## 2019-12-31 DIAGNOSIS — R42 Dizziness and giddiness: Secondary | ICD-10-CM

## 2019-12-31 DIAGNOSIS — R002 Palpitations: Secondary | ICD-10-CM

## 2019-12-31 DIAGNOSIS — R0602 Shortness of breath: Secondary | ICD-10-CM | POA: Diagnosis not present

## 2019-12-31 DIAGNOSIS — R000 Tachycardia, unspecified: Secondary | ICD-10-CM | POA: Diagnosis not present

## 2019-12-31 MED ORDER — DILTIAZEM HCL ER 180 MG PO CP24: 180.0000 mg | ORAL_CAPSULE | Freq: Every day | ORAL | 2 refills | Status: DC

## 2019-12-31 NOTE — Patient Instructions (Signed)
Medication Instructions:  Your physician has recommended you make the following change in your medication:  1- INCREASE Diltiazem to 180 mg by mouth once a day.  *If you need a refill on your cardiac medications before your next appointment, please call your pharmacy*  Lab Work: none If you have labs (blood work) drawn today and your tests are completely normal, you will receive your results only by: Marland Kitchen MyChart Message (if you have MyChart) OR . A paper copy in the mail If you have any lab test that is abnormal or we need to change your treatment, we will call you to review the results.  Testing/Procedures: none  Follow-Up: At [facility], you and your health needs are our priority.  As part of our continuing mission to provide you with exceptional heart care, we have created designated Provider Care Teams.  These Care Teams include your primary Cardiologist (physician) and Advanced Practice Providers (APPs -  Physician Assistants and Nurse Practitioners) who all work together to provide you with the care you need, when you need it.  Your next appointment:   3 month(s) with APP  The format for your next appointment:   In Person  Provider:    You may see one of the following Advanced Practice Providers on your designated Care Team:    Murray Hodgkins, NP  Christell Faith, PA-C  Marrianne Mood, PA-C

## 2020-01-01 ENCOUNTER — Encounter: Payer: Self-pay | Admitting: Internal Medicine

## 2020-01-05 ENCOUNTER — Ambulatory Visit: Payer: Medicare Other | Admitting: Primary Care

## 2020-01-05 ENCOUNTER — Other Ambulatory Visit: Payer: Self-pay

## 2020-01-07 ENCOUNTER — Telehealth: Payer: Self-pay | Admitting: Primary Care

## 2020-01-07 MED ORDER — ALBUTEROL SULFATE HFA 108 (90 BASE) MCG/ACT IN AERS: 2.0000 | INHALATION_SPRAY | Freq: Four times a day (QID) | RESPIRATORY_TRACT | 2 refills | Status: DC | PRN

## 2020-01-07 NOTE — Telephone Encounter (Signed)
Rx for proair has been sent to preferred pharmacy. Pt is aware and voiced her understanding. Nothing further is needed.

## 2020-01-20 DIAGNOSIS — S46011D Strain of muscle(s) and tendon(s) of the rotator cuff of right shoulder, subsequent encounter: Secondary | ICD-10-CM | POA: Diagnosis not present

## 2020-01-21 DIAGNOSIS — M75122 Complete rotator cuff tear or rupture of left shoulder, not specified as traumatic: Secondary | ICD-10-CM | POA: Diagnosis not present

## 2020-02-20 DIAGNOSIS — J209 Acute bronchitis, unspecified: Secondary | ICD-10-CM | POA: Diagnosis not present

## 2020-02-20 DIAGNOSIS — Z8709 Personal history of other diseases of the respiratory system: Secondary | ICD-10-CM | POA: Diagnosis not present

## 2020-02-23 DIAGNOSIS — Z79899 Other long term (current) drug therapy: Secondary | ICD-10-CM | POA: Diagnosis not present

## 2020-02-23 DIAGNOSIS — Z5181 Encounter for therapeutic drug level monitoring: Secondary | ICD-10-CM | POA: Diagnosis not present

## 2020-02-25 DIAGNOSIS — M25512 Pain in left shoulder: Secondary | ICD-10-CM | POA: Diagnosis not present

## 2020-02-25 DIAGNOSIS — Z79899 Other long term (current) drug therapy: Secondary | ICD-10-CM | POA: Diagnosis not present

## 2020-02-25 DIAGNOSIS — M791 Myalgia, unspecified site: Secondary | ICD-10-CM | POA: Diagnosis not present

## 2020-02-25 DIAGNOSIS — M545 Low back pain: Secondary | ICD-10-CM | POA: Diagnosis not present

## 2020-03-13 ENCOUNTER — Ambulatory Visit: Payer: Medicare Other | Admitting: Physician Assistant

## 2020-03-15 ENCOUNTER — Ambulatory Visit (INDEPENDENT_AMBULATORY_CARE_PROVIDER_SITE_OTHER): Payer: Medicare Other | Admitting: Family Medicine

## 2020-03-15 ENCOUNTER — Other Ambulatory Visit: Payer: Self-pay

## 2020-03-15 ENCOUNTER — Ambulatory Visit: Payer: Self-pay | Admitting: Family Medicine

## 2020-03-15 ENCOUNTER — Encounter: Payer: Self-pay | Admitting: Family Medicine

## 2020-03-15 DIAGNOSIS — B370 Candidal stomatitis: Secondary | ICD-10-CM

## 2020-03-15 DIAGNOSIS — J441 Chronic obstructive pulmonary disease with (acute) exacerbation: Secondary | ICD-10-CM

## 2020-03-15 MED ORDER — FLUCONAZOLE 200 MG PO TABS: ORAL_TABLET | ORAL | 3 refills | Status: DC

## 2020-03-15 MED ORDER — PREDNISONE 20 MG PO TABS: ORAL_TABLET | ORAL | 0 refills | Status: DC

## 2020-03-15 MED ORDER — HYDROCOD POLST-CPM POLST ER 10-8 MG/5ML PO SUER: 5.0000 mL | Freq: Two times a day (BID) | ORAL | 0 refills | Status: DC | PRN

## 2020-03-15 MED ORDER — LEVOFLOXACIN 500 MG PO TABS: 500.0000 mg | ORAL_TABLET | Freq: Every day | ORAL | 0 refills | Status: DC

## 2020-03-15 NOTE — Addendum Note (Signed)
Addended by: Olin Hauser on: 03/15/2020 12:27 PM   Modules accepted: Orders

## 2020-03-15 NOTE — Patient Instructions (Addendum)
COPD flare Start Prednisone for 10 days, if need antibiotic then can start Levaquin within first 48 hours  Refilled Tussionex Diflucan  Will message lung doctors, and ask about Overnight Oxygen testing, they may be able to set this up for you, call  Or message them if not heard back within 1-2 weeks.  If not improving can do chest x-ray   Please schedule a Follow-up Appointment to: Return in about 1 week (around 03/22/2020), or if symptoms worsen or fail to improve, for COPD.  If you have any other questions or concerns, please feel free to call the office or send a message through [place]. You may also schedule an earlier appointment if necessary.  Additionally, you may be receiving a survey about your experience at our office within a few days to 1 week by e-mail or mail. We value your feedback.  [name], DO [place]

## 2020-03-15 NOTE — Progress Notes (Addendum)
Subjective:    Patient ID: Caroline Conley, female    DOB: 07-01-1966, 54 y.o.   MRN: [id number]  Caroline Conley is a 54 y.o. female presenting on 03/15/2020 for COPD (onset 4 days)   HPI   ACUTE COPD EXACERBATION Last visit with Pulmonology virtual video 12/22/19, she was switched from [place] over to Trelegy also has been on Daliresp, with some improvement overall. They held off antibiotics. She had previous Doxycycline course and then switched to Levaquin in 11/2019. She was given extended Prednisone taper, 40mg  x 5 days, 20mg  x 5 days, 10mg  x 5 days. They checked CXR and due for sputum culture. - She says the 10 day course of prednisone did better than the 15 day course.  Today says her symptoms with COPD seems worse than prior flares. She feels drained and fatigued and persistent mucus sputum production. Worsening at night over past 4 days. Seems worse when wake up in AM feeling significant productive mucus cough in AM. - Admits some recent weather changes or allergies/pollen contributing for her congestion. - Denies chest pain, acute worsening dyspnea at this time, headache, near syncope, nausea vomiting   Depression screen [facility] 2/9 11/24/2019 10/05/2019 09/08/2019  Decreased Interest 0 0 0  Down, Depressed, Hopeless 0 0 0  PHQ - 2 Score 0 0 0  Altered sleeping 0 0 -  Tired, decreased energy 0 0 -  Change in appetite 0 0 -  Feeling bad or failure about yourself  0 0 -  Trouble concentrating 0 0 -  Moving slowly or fidgety/restless 0 0 -  Suicidal thoughts 0 0 -  PHQ-9 Score 0 0 -    Social History   Tobacco Use  . Smoking status: Former Smoker    Packs/day: 1.00    Years: 30.00    Pack years: 30.00    Types: Cigarettes    Quit date: 03/02/2018    Years since quitting: 2.0  . Smokeless tobacco: Former Systems developer  . Tobacco comment: 0.5 to 1 ppd >30 years  Substance Use Topics  . Alcohol use: No  . Drug use: No    Review of Systems Per HPI unless specifically  indicated above     Objective:    BP 132/88   Pulse (!) 108   Temp 98.4 F (36.9 C) (Temporal)   Resp 16   Ht 5\' 6"  (1.676 m)   Wt 129 lb 9.6 oz (58.8 kg)   SpO2 97%   BMI 20.92 kg/m   Wt Readings from Last 3 Encounters:  03/15/20 129 lb 9.6 oz (58.8 kg)  12/31/19 130 lb 8 oz (59.2 kg)  03/29/19 137 lb (62.1 kg)    Physical Exam Vitals and nursing note reviewed.  Constitutional:      General: She is not in acute distress.    Appearance: She is well-developed. She is not diaphoretic.     Comments: Well-appearing, comfortable, cooperative  HENT:     Head: Normocephalic and atraumatic.  Eyes:     General:        Right eye: No discharge.        Left eye: No discharge.     Conjunctiva/sclera: Conjunctivae normal.  Neck:     Thyroid: No thyromegaly.  Cardiovascular:     Rate and Rhythm: Normal rate and regular rhythm.     Heart sounds: Normal heart sounds. No murmur.  Pulmonary:     Effort: Pulmonary effort is normal. No respiratory distress.  Breath sounds: Wheezing (diffuse, and end expiratory) present. No rales.     Comments: Reduced air movement bilateral Musculoskeletal:        General: Normal range of motion.     Cervical back: Normal range of motion and neck supple.  Lymphadenopathy:     Cervical: No cervical adenopathy.  Skin:    General: Skin is warm and dry.     Findings: No erythema or rash.  Neurological:     Mental Status: She is alert and oriented to person, place, and time.  Psychiatric:        Behavior: Behavior normal.     Comments: Well groomed, good eye contact, normal speech and thoughts    Results for orders placed or performed in visit on 12/31/18  Magnesium  Result Value Ref Range   Magnesium 1.8 1.5 - 2.5 mg/dL  Basic Metabolic Panel (BMET)  Result Value Ref Range   Glucose, Bld 109 (H) 65 - 99 mg/dL   BUN 11 7 - 25 mg/dL   Creat 1.16 (H) 0.50 - 1.05 mg/dL   BUN/Creatinine Ratio 9 6 - 22 (calc)   Sodium 134 (L) 135 - 146 mmol/L     Potassium 4.3 3.5 - 5.3 mmol/L   Chloride 100 98 - 110 mmol/L   CO2 27 20 - 32 mmol/L   Calcium 9.6 8.6 - 10.4 mg/dL      Assessment & Plan:   Problem List Items Addressed This Visit    None    Visit Diagnoses    COPD with acute exacerbation (HCC)       Relevant Medications   chlorpheniramine-HYDROcodone (TUSSIONEX PENNKINETIC ER) 10-8 MG/5ML SUER   predniSONE (DELTASONE) 20 MG tablet   levofloxacin (LEVAQUIN) 500 MG tablet   Oral yeast infection       Relevant Medications   fluconazole (DIFLUCAN) 200 MG tablet      # ACUTE COPD EXACERBATION / Severe COPD Clinically with similar to previous flareacute COPD exacerbation, worsening productive cough with sputum production and some dyspnea but not currently active. Has system symptoms of fatigue. No acute sign of infection or concurrent pneumonia. Afebrile. Advanced COPD, seems to have persistent recurrent flares - Continues on maintenance therapy inhalers/nebs Last imaging CXR 12/2019 Followed by [facility] Start Prednisone 10 day taper, 40mg  x 4 days, 20mg  x 4 days, 10mg  x 2 days - worked better for her than prior 15 day prolonged course. If not improved within 48 hours, then she may start taking Levaquin antibiotic 500mg  daily x 7 days Refill Tussionex - however E-script submission to [facility] failed for this controlled, their system is down and it is being repaired, spoke with pharmacy now, they will make a note and get the other 3 non controlled rx ready and notify her that we will send it later once their system is back up. They can call our office to request rx when ready. Re order diflucan, while using Trelegy F/u as planned / advised - return within 1 week if not improve can consider Chest X-ray  Will send chart and FYI message to [facility] pul[facility] to check to see if they would agree with a repeat overnight oximetry test, her last one was done in 2018 and it was negative.  Meds ordered this encounter   Medications  . DISCONTD: chlorpheniramine-HYDROcodone (TUSSIONEX PENNKINETIC ER) 10-8 MG/5ML SUER    Sig: Take 5 mLs by mouth every 12 (twelve) hours as needed for cough.    Dispense:  140 mL    Refill:  0  . predniSONE (DELTASONE) 20 MG tablet    Sig: Take 2 tablets daily (40mg ) for 4 days, take 1 tab daily (20mg ) for 4 days, take half tab daily (10mg ) for 2 days. Total course 10 day    Dispense:  13 tablet    Refill:  0  . fluconazole (DIFLUCAN) 200 MG tablet    Sig: TAKE 1 TABLET BY MOUTH ONCE DAILY AS NEEDED YEAST MAY REPEAT IN 48 HRS    Dispense:  5 tablet    Refill:  3  . levofloxacin (LEVAQUIN) 500 MG tablet    Sig: Take 1 tablet (500 mg total) by mouth daily. For 7 days    Dispense:  7 tablet    Refill:  0  . chlorpheniramine-HYDROcodone (TUSSIONEX PENNKINETIC ER) 10-8 MG/5ML SUER    Sig: Take 5 mLs by mouth every 12 (twelve) hours as needed for cough.    Dispense:  140 mL    Refill:  0   NOTE - 1227pm on 3/31 - spoke with patient, she requested tussionex sent to [facility] instead of waiting on [facility]. New rx sent.  Follow up plan: Return in about 1 week (around 03/22/2020), or if symptoms worsen or fail to improve, for COPD.   [name], [facility] 03/15/2020, 10:41 AM

## 2020-03-15 NOTE — Addendum Note (Signed)
Addended by: Olin Hauser on: 03/15/2020 12:25 PM   Modules accepted: Orders

## 2020-03-20 NOTE — Progress Notes (Deleted)
Cardiology Office Note    Date:  03/20/2020   ID:  [name], [name] 1966/11/25, MRN [id number]  PCP:  Olin Hauser, DO  Cardiologist:  [name], MD  Electrophysiologist:  None   Chief Complaint: Follow up  History of Present Illness:   Caroline Conley is a 54 y.o. female with history of atypical chest pain with normal coronary arteries by LHC in 2015, COPD secondary to prior tobacco use, palpitations, and basal cell carcinoma who presents for follow-up.  She was evaluated in the summer 2019 with tachypalpitations.  Echo in 07/2018 showed normal LV systolic function with grade 1 diastolic dysfunction.  48-hour Holter monitor showed an average heart rate of 106 bpm with no significant arrhythmia and rare PACs/PVCs.  She was placed on diltiazem at that time and in follow-up continued to note elevated heart rates with normal activities as well as intermittent chest discomfort and dyspnea.  In this setting, she underwent nuclear stress testing which was nonischemic in 08/2018.  In 12/2018 she had a lightheaded/syncopal episode, possibly attributed to orthostasis.  Subsequent Zio patch in 12/2018 showed a predominant rhythm of sinus with an average heart rate of 96 bpm (range 60 to 157 bpm), rare PACs and PVCs, 2 atrial runs lasting up to 5 beats with a maximal rate of 157 bpm, no sustained arrhythmias or prolonged pauses.  Patient triggered events corresponded to sinus rhythm and artifact.  Some of her symptoms have been felt to possibly be related to vertigo.  She was last seen in the office in 12/2019 noting that her palpitations were more frequent and severe.  She also noted shortness of breath and cough that has been worse.  She continues to note intermittent dizziness with work-up from ENT being unrevealing.  Her longstanding palpitations were felt to have been exacerbated by recent respiratory decompensation and steroid use.  In this setting, Cardizem was titrated to 180 mg daily.   She was encouraged to remain well-hydrated.  Her dyspnea was felt to be driven by underlying COPD.  She was euvolemic on exam.  She was advised to follow-up with pulmonology.  She was evaluated by PCP on 03/15/2020 with recurrent COPD exacerbation and prescribed steroids with continued use of her inhalers/nebulizers.  ***   Labs independently reviewed: 12/2018 - BUN 11, serum creatinine 1.16, magnesium 1.8, Hgb 13.1, PLT 324 05/2018 - albumin 3.7, AST/LT normal 07/2017 - A1c 4.9, TC 169, TG 243, HDL 35, LDL 85  Past Medical History:  Diagnosis Date  . Atypical chest pain    a. 2015 Cath ([facility]): Nl cors. Nl EF; b. 10/2018 MV: No ischemia/infarct. EF>65%.  . Cancer ([place]) 01/2016   basal cell   . Emphysema of lung ([place])   . Palpitations    a. 07/2018 Echo: EF 60-65%, no rwma, Gr1 DD, Nl RV fxn; b. 07/2018 48h Holter: predominantly RSR, avg rate 106 (69-166 bpm), Rare PAC's and occas PVC's. 2 atrial runs up to 5 beats. No sustained arrhythmias or prolonged pauses. No symptoms reported.  . Wears dentures    full upper and lower    Past Surgical History:  Procedure Laterality Date  . ABDOMINAL HYSTERECTOMY    . APPENDECTOMY    . BACK SURGERY     this will make third time  . CARDIAC CATHETERIZATION  09/20/14   [facility] - Dr. Clayborn Bigness  . COLONOSCOPY WITH PROPOFOL N/A 07/08/2016   Procedure: COLONOSCOPY WITH PROPOFOL;  Surgeon: Lucilla Lame, MD;  Location: [facility];  Service: Endoscopy;  Laterality: N/A;  . DEBRIDEMENT TENNIS ELBOW    . METATARSAL OSTEOTOMY  05/18/2013  . ROTATOR CUFF REPAIR    . TONSILLECTOMY      Current Medications: No outpatient medications have been marked as taking for the 03/23/20 encounter (Appointment) with Rise Mu, PA-C.    Allergies:   2,4-d dimethylamine (amisol); Morphine and related; and Vicodin [hydrocodone-acetaminophen]   Social History   Socioeconomic History  . Marital status: Married    Spouse name: Not on file  . Number of children:  Not on file  . Years of education: Not on file  . Highest education level: Not on file  Occupational History  . Not on file  Tobacco Use  . Smoking status: Former Smoker    Packs/day: 1.00    Years: 30.00    Pack years: 30.00    Types: Cigarettes    Quit date: 03/02/2018    Years since quitting: 2.0  . Smokeless tobacco: Former Systems developer  . Tobacco comment: 0.5 to 1 ppd >30 years  Substance and Sexual Activity  . Alcohol use: No  . Drug use: No  . Sexual activity: Yes  Other Topics Concern  . Not on file  Social History Narrative  . Not on file   Social Determinants of Health   Financial Resource Strain:   . Difficulty of Paying Living Expenses:   Food Insecurity:   . Worried About Charity fundraiser in the Last Year:   . Arboriculturist in the Last Year:   Transportation Needs:   . Film/video editor (Medical):   Marland Kitchen Lack of Transportation (Non-Medical):   Physical Activity:   . Days of Exercise per Week:   . Minutes of Exercise per Session:   Stress:   . Feeling of Stress :   Social Connections:   . Frequency of Communication with Friends and Family:   . Frequency of Social Gatherings with Friends and Family:   . Attends Religious Services:   . Active Member of Clubs or Organizations:   . Attends Archivist Meetings:   Marland Kitchen Marital Status:      Family History:  The patient's family history includes Arthritis/Rheumatoid in her father; Cancer in her father; Diabetes in her brother, father, and mother; Gout in her mother; Heart failure in her brother and father; High blood pressure in her father and mother; Stroke in her father.  ROS:   ROS   EKGs/Labs/Other Studies Reviewed:    Studies reviewed were summarized above. The additional studies were reviewed today:  2D echo 07/2018: - Left ventricle: The cavity size was normal. Wall thickness was  normal. Systolic function was normal. The estimated ejection  fraction was in the range of 60% to 65%. Wall  motion was normal;  there were no regional wall motion abnormalities. Doppler  parameters are consistent with abnormal left ventricular  relaxation (grade 1 diastolic dysfunction).  - Right ventricle: The cavity size was normal. Systolic function  was normal.  __________  48-hour Holter 08/2018:  The patient was monitored for 48 hours.  The predominant rhythm was sinus with an average rate of 106 bpm (range 69-166 bpm). The longest R-R interval was 1.1 seconds.  There were rare PAC's and occasional PVC's.  Two atrial runs lasting up to 5 beats were noted.  No sustained arrhythmia or prolonged pause was seen.  There were no patient reported symptoms.   Predominantly sinus rhythm with rare PAC's  and PVC's, as well as brief atrial runs.  Average rate during the monitoring period was elevated (106 bpm). __________  Nuclear stress test 10/2018:  There was no ST segment deviation noted during stress.  The study is normal.  This is a low risk study.  The left ventricular ejection fraction is hyperdynamic (>65%). __________  Elwyn Reach patch 01/2019:  The patient was monitored for 13 days; only 6 days, 14 hours yielded diagnostic tracings due to significant artifact.  The predominant rhythm was sinus with an average rate of 96 bpm (range 60-157 bpm).  Rare PAC's and PVC's were noted.  There were two atrial runs lasting up to 5 beats with a maximal rate of 157 bpm.  No sustained arrhythmia or prolonged pause was identified.  Patient triggered events correspond to sinus rhythm and artifact.   Limited study due to significant artifact.  Usable tracings show predominantly sinus rhythm with rare PAC's and PVC's.  Two brief atrial runs noted (up to 5 beats).   EKG:  EKG is ordered today.  The EKG ordered today demonstrates ***  Recent Labs: No results found for requested labs within last 8760 hours.  Recent Lipid Panel    Component Value Date/Time   CHOL 169 07/29/2017  1229   TRIG 243 (H) 07/29/2017 1229   HDL 35 (L) 07/29/2017 1229   CHOLHDL 4.8 07/29/2017 1229   VLDL 49 (H) 07/29/2017 1229   LDLCALC 85 07/29/2017 1229    PHYSICAL EXAM:    VS:  There were no vitals taken for this visit.  BMI: There is no height or weight on file to calculate BMI.  Physical Exam  Wt Readings from Last 3 Encounters:  03/15/20 129 lb 9.6 oz (58.8 kg)  12/31/19 130 lb 8 oz (59.2 kg)  03/29/19 137 lb (62.1 kg)     ASSESSMENT & PLAN:   1. ***  Disposition: F/u with Dr. Saunders Revel or an APP in ***.   Medication Adjustments/Labs and Tests Ordered: Current medicines are reviewed at length with the patient today.  Concerns regarding medicines are outlined above. Medication changes, Labs and Tests ordered today are summarized above and listed in the Patient Instructions accessible in Encounters.   Signed, Christell Faith, PA-C 03/20/2020 7:31 AM     [facility] [address] [place] [address] [place], [place] [address] [phone]

## 2020-03-23 ENCOUNTER — Ambulatory Visit: Payer: Medicare Other | Admitting: Physician Assistant

## 2020-03-24 ENCOUNTER — Other Ambulatory Visit: Payer: Self-pay | Admitting: Internal Medicine

## 2020-03-27 ENCOUNTER — Encounter: Payer: Self-pay | Admitting: Family

## 2020-03-27 ENCOUNTER — Ambulatory Visit (INDEPENDENT_AMBULATORY_CARE_PROVIDER_SITE_OTHER): Payer: BC Managed Care – PPO | Admitting: Family

## 2020-03-27 ENCOUNTER — Other Ambulatory Visit: Payer: Self-pay

## 2020-03-27 VITALS — BP 122/70 | HR 91 | Ht 66.0 in | Wt 131.5 lb

## 2020-03-27 DIAGNOSIS — R002 Palpitations: Secondary | ICD-10-CM

## 2020-03-27 DIAGNOSIS — R000 Tachycardia, unspecified: Secondary | ICD-10-CM | POA: Diagnosis not present

## 2020-03-27 DIAGNOSIS — J449 Chronic obstructive pulmonary disease, unspecified: Secondary | ICD-10-CM

## 2020-03-27 DIAGNOSIS — R42 Dizziness and giddiness: Secondary | ICD-10-CM | POA: Diagnosis not present

## 2020-03-27 NOTE — Patient Instructions (Addendum)
Medication Instructions:  No medication changes today.  *If you need a refill on your cardiac medications before your next appointment, please call your pharmacy*   Lab Work: No lab work today.  If you have labs (blood work) drawn today and your tests are completely normal, you will receive your results only by: Marland Kitchen MyChart Message (if you have MyChart) OR . A paper copy in the mail If you have any lab test that is abnormal or we need to change your treatment, we will call you to review the results.   Testing/Procedures: Your EKG today showed normal sinus rhythm.   Follow-Up: At [facility], you and your health needs are our priority.  As part of our continuing mission to provide you with exceptional heart care, we have created designated Provider Care Teams.  These Care Teams include your primary Cardiologist (physician) and Advanced Practice Providers (APPs -  Physician Assistants and Nurse Practitioners) who all work together to provide you with the care you need, when you need it.  We recommend signing up for the patient portal called "MyChart".  Sign up information is provided on this After Visit Summary.  MyChart is used to connect with patients for Virtual Visits (Telemedicine).  Patients are able to view lab/test results, encounter notes, upcoming appointments, etc.  Non-urgent messages can be sent to your provider as well.   To learn more about what you can do with MyChart, go to [email or web address].    Your next appointment:  In 4 months with Dr. Saunders Revel or APP

## 2020-03-27 NOTE — Progress Notes (Signed)
Office Visit    Patient Name: Caroline Conley Date of Encounter: 03/27/2020  Primary Care Provider:  Olin Hauser, DO Primary Cardiologist:  [name], MD Electrophysiologist:  None   Chief Complaint    Caroline Conley is a 54 y.o. female with a hx of palpitations, atypical chest pain with clean coronary arteries by LHC 2015, COPD, prior tobacco abuse, basal cell carcinoma presents today for follow-up of palpitations  Past Medical History    Past Medical History:  Diagnosis Date  . Atypical chest pain    a. 2015 Cath ([facility]): Nl cors. Nl EF; b. 10/2018 MV: No ischemia/infarct. EF>65%.  . Cancer ([place]) 01/2016   basal cell   . Emphysema of lung ([place])   . Palpitations    a. 07/2018 Echo: EF 60-65%, no rwma, Gr1 DD, Nl RV fxn; b. 07/2018 48h Holter: predominantly RSR, avg rate 106 (69-166 bpm), Rare PAC's and occas PVC's. 2 atrial runs up to 5 beats. No sustained arrhythmias or prolonged pauses. No symptoms reported.  . Wears dentures    full upper and lower   Past Surgical History:  Procedure Laterality Date  . ABDOMINAL HYSTERECTOMY    . APPENDECTOMY    . BACK SURGERY     this will make third time  . CARDIAC CATHETERIZATION  09/20/14   [facility] - Dr. Clayborn Bigness  . COLONOSCOPY WITH PROPOFOL N/A 07/08/2016   Procedure: COLONOSCOPY WITH PROPOFOL;  Surgeon: Lucilla Lame, MD;  Location: [facility];  Service: Endoscopy;  Laterality: N/A;  . DEBRIDEMENT TENNIS ELBOW    . METATARSAL OSTEOTOMY  05/18/2013  . ROTATOR CUFF REPAIR    . TONSILLECTOMY      Allergies  Allergies  Allergen Reactions  . 2,4-D Dimethylamine (Amisol) Itching    (Pt unsure of this allergy)  . Morphine And Related Itching  . Vicodin [Hydrocodone-Acetaminophen] Nausea And Vomiting    GI distress    History of Present Illness    Caroline Conley is a 54 y.o. female with a hx of alpitations, atypical chest pain with clean coronary arteries by [place], COPD, prior tobacco abuse, basal  cell carcinoma last seen January 2020 by Dr. Saunders Revel.  Echocardiogram 07/2018 with normal LVEF with grade 1 diastolic dysfunction.  Telemetry monitoring 02/2019 with lots of baseline artifact.  However, show predominantly sinus rhythm average rate 96 bpm, rare PAC and PVC, 2 atrial runs lasting up to 5 beats with maximal rate 157 bpm.  At last office visit noted shortness of breath and coughing had been worse.  She was on her second course of prednisone in the last 3 weeks.  She was seen by ENT for dizziness but no noted cause.  Her diltiazem was increased 180 mg daily.  She reports feeling well.  She is very pleased with her response to diltiazem 180 mg daily.  Tells me her palpitations have resolved.  Reports no chest pain, pressure, tightness.  Reports her dyspnea on exertion and shortness of breath are stable at her baseline.  She attributes these to her COPD.  Tells me that she think she requires antibiotics and/or a prednisone taper about every 6 weeks.  Does not notice changes in her palpitations while on prednisone.  Reports no recurrent lightheadedness, dizziness.  EKGs/Labs/Other Studies Reviewed:   The following studies were reviewed today:  TTE (08/04/2018): Normal LV size and wall thickness. LVEF 60-65% with normal wall motion. Grade 1 diastolic dysfunction. Normal RV size and function. No significant valvular  abnormality.   14-day event monitor (03/04/2019): Limited study due to significant artifact.  Usable tracings show predominantly sinus rhythm with rare PAC's and PVC's.  Two brief atrial runs noted (up to 5 beats).  48-hour Holter monitor (08/06/2018): Predominantly sinus rhythm with rare PAC's and PVC's, as well as brief atrial runs.  Average rate during the monitoring period was elevated (106 bpm  EKG:  EKG is  ordered today.  The ekg ordered today demonstrates SR 91 bpm with no acute ST/T wave changes.  Recent Labs: No results found for requested labs within last 8760 hours.    Recent Lipid Panel    Component Value Date/Time   CHOL 169 07/29/2017 1229   TRIG 243 (H) 07/29/2017 1229   HDL 35 (L) 07/29/2017 1229   CHOLHDL 4.8 07/29/2017 1229   VLDL 49 (H) 07/29/2017 1229   LDLCALC 85 07/29/2017 1229    Home Medications   No outpatient medications have been marked as taking for the 03/27/20 encounter (Appointment) with Loel Dubonnet, NP.      Review of Systems      Review of Systems  Constitution: Negative for chills, fever and malaise/fatigue.  Cardiovascular: Positive for dyspnea on exertion. Negative for chest pain, leg swelling, near-syncope, orthopnea, palpitations and syncope.  Respiratory: Positive for shortness of breath. Negative for cough and wheezing.   Gastrointestinal: Negative for nausea and vomiting.  Neurological: Negative for dizziness, light-headedness and weakness.   All other systems reviewed and are otherwise negative except as noted above.  Physical Exam    VS:  There were no vitals taken for this visit. , BMI There is no height or weight on file to calculate BMI. GEN: Well nourished, well developed, in no acute distress. HEENT: normal. Neck: Supple, no JVD, carotid bruits, or masses. Cardiac: RRR, no murmurs, rubs, or gallops. No clubbing, cyanosis, edema.  Radials/DP/PT 2+ and equal bilaterally.  Respiratory:  Respirations regular and unlabored. RUL and RLL with rhonchi. No crackles, wheeze noted.  GI: Soft, nontender, nondistended, BS + x 4. MS: No deformity or atrophy. Skin: Warm and dry, no rash. Neuro:  Strength and sensation are intact. Psych: Normal affect.  Accessory Clinical Findings    ECG personally reviewed by me today - SR 91 bpm - no acute changes.  Assessment & Plan    1. Palpitations and sinus tachycardia - Monitor 02/2019 with difficult interpretation due to artifact showing predominantly sinus rhythm, rare PVC/PAC, 2 brief atrial runs (up to 5 beats).  Reports palpitations much improved on diltiazem  180 mg daily dose.  Encouraged to continue to avoid caffeine, alcohol, smoking.  Continue Diltiazem 180mg  daily.   2. Lightheadedness - Has resolved.  Encouraged continued adequate hydration.  Encouraged slow position changes.  No additional testing recommended at this time.  3. COPD - Continues to follow with her PCP.  Recently started on Trelegy and very pleased with result.  Continued smoking cessation encouraged.   Disposition: Follow up in 4 month(s) with Dr. Saunders Revel or APP.    Loel Dubonnet, NP 03/27/2020, 8:52 AM

## 2020-03-30 ENCOUNTER — Ambulatory Visit: Payer: Medicare Other | Admitting: Physician Assistant

## 2020-04-04 DIAGNOSIS — H02821 Cysts of right upper eyelid: Secondary | ICD-10-CM | POA: Diagnosis not present

## 2020-04-04 DIAGNOSIS — H52223 Regular astigmatism, bilateral: Secondary | ICD-10-CM | POA: Diagnosis not present

## 2020-05-01 DIAGNOSIS — D485 Neoplasm of uncertain behavior of skin: Secondary | ICD-10-CM | POA: Diagnosis not present

## 2020-05-01 DIAGNOSIS — L538 Other specified erythematous conditions: Secondary | ICD-10-CM | POA: Diagnosis not present

## 2020-05-01 DIAGNOSIS — D225 Melanocytic nevi of trunk: Secondary | ICD-10-CM | POA: Diagnosis not present

## 2020-05-01 DIAGNOSIS — L820 Inflamed seborrheic keratosis: Secondary | ICD-10-CM | POA: Diagnosis not present

## 2020-05-01 DIAGNOSIS — R208 Other disturbances of skin sensation: Secondary | ICD-10-CM | POA: Diagnosis not present

## 2020-05-24 ENCOUNTER — Telehealth (INDEPENDENT_AMBULATORY_CARE_PROVIDER_SITE_OTHER): Payer: Medicare Other | Admitting: Family Medicine

## 2020-05-24 ENCOUNTER — Other Ambulatory Visit: Payer: Self-pay | Admitting: Family Medicine

## 2020-05-24 ENCOUNTER — Other Ambulatory Visit: Payer: Self-pay

## 2020-05-24 ENCOUNTER — Encounter: Payer: Self-pay | Admitting: Family Medicine

## 2020-05-24 DIAGNOSIS — J441 Chronic obstructive pulmonary disease with (acute) exacerbation: Secondary | ICD-10-CM | POA: Diagnosis not present

## 2020-05-24 DIAGNOSIS — E538 Deficiency of other specified B group vitamins: Secondary | ICD-10-CM

## 2020-05-24 DIAGNOSIS — B370 Candidal stomatitis: Secondary | ICD-10-CM

## 2020-05-24 DIAGNOSIS — E559 Vitamin D deficiency, unspecified: Secondary | ICD-10-CM

## 2020-05-24 DIAGNOSIS — J431 Panlobular emphysema: Secondary | ICD-10-CM

## 2020-05-24 DIAGNOSIS — R5383 Other fatigue: Secondary | ICD-10-CM

## 2020-05-24 MED ORDER — FLUCONAZOLE 200 MG PO TABS: ORAL_TABLET | ORAL | 3 refills | Status: DC

## 2020-05-24 MED ORDER — PROAIR HFA 108 (90 BASE) MCG/ACT IN AERS: 2.0000 | INHALATION_SPRAY | Freq: Four times a day (QID) | RESPIRATORY_TRACT | 2 refills | Status: DC | PRN

## 2020-05-24 MED ORDER — LEVOFLOXACIN 500 MG PO TABS: 500.0000 mg | ORAL_TABLET | Freq: Every day | ORAL | 0 refills | Status: DC

## 2020-05-24 MED ORDER — PREDNISONE 20 MG PO TABS: ORAL_TABLET | ORAL | 0 refills | Status: DC

## 2020-05-24 NOTE — Progress Notes (Signed)
Virtual Visit via Telephone The purpose of this virtual visit is to provide medical care while limiting exposure to the novel coronavirus (COVID19) for both patient and office staff.  Consent was obtained for phone visit:  Yes.   Answered questions that patient had about telehealth interaction:  Yes.   I discussed the limitations, risks, security and privacy concerns of performing an evaluation and management service by telephone. I also discussed with the patient that there may be a patient responsible charge related to this service. The patient expressed understanding and agreed to proceed.  Patient Location: Home Provider Location: [facility])  ---------------------------------------------------------------------- Chief Complaint  Patient presents with  . COPD    onset 4 days --but from past 2 days getting worst    S: Reviewed CMA documentation. I have called patient and gathered additional HPI as follows:  ACUTE COPD EXACERBATION  Last visit with me 03/15/20, treated AECOPD with Levaquin and Prednisone, improved in few days.  Followed by Dr [facility] - now on Trelegy since 12/2019 with improvement on maintenance therapy, now needs re order says pharmacy has none left, was ordered by [facility]  Her oxygen is running 96-97% home pulse ox  Today says her symptoms with COPD seems worse than prior flares. She feels drained and fatigued and persistent mucus sputum production. Worsening over past 2 days but onset 4 days ago.  History of mild low B12 low in past. Last checked 2019. Asking about lab work and repeat, admits fatigue.  Denies any known or suspected exposure to person with or possibly with COVID19.  Denies any fevers, chills, sweats, body ache, sinus pain or pressure, headache, abdominal pain, diarrhea  HM UTD COVID19 vaccine Pfizer.  Past Medical History:  Diagnosis Date  . Atypical chest pain    a. 2015 Cath ([place]): Nl  cors. Nl EF; b. 10/2018 MV: No ischemia/infarct. EF>65%.  . Cancer ([place]) 01/2016   basal cell   . Emphysema of lung ([place])   . Palpitations    a. 07/2018 Echo: EF 60-65%, no rwma, Gr1 DD, Nl RV fxn; b. 07/2018 48h Holter: predominantly RSR, avg rate 106 (69-166 bpm), Rare PAC's and occas PVC's. 2 atrial runs up to 5 beats. No sustained arrhythmias or prolonged pauses. No symptoms reported.  . Wears dentures    full upper and lower   Social History   Tobacco Use  . Smoking status: Former Smoker    Packs/day: 1.00    Years: 30.00    Pack years: 30.00    Types: Cigarettes    Quit date: 03/02/2018    Years since quitting: 2.2  . Smokeless tobacco: Former Systems developer  . Tobacco comment: 0.5 to 1 ppd >30 years  Substance Use Topics  . Alcohol use: No  . Drug use: No    Current Outpatient Medications:  .  albuterol (PROVENTIL) (2.5 MG/3ML) 0.083% nebulizer solution, Take 2.5 mg by nebulization every 6 (six) hours as needed., Disp: , Rfl:  .  chlorpheniramine-HYDROcodone (TUSSIONEX PENNKINETIC ER) 10-8 MG/5ML SUER, Take 5 mLs by mouth every 12 (twelve) hours as needed for cough., Disp: 140 mL, Rfl: 0 .  diltiazem (DILACOR XR) 180 MG 24 hr capsule, Take 1 capsule (180 mg total) by mouth daily., Disp: 90 capsule, Rfl: 2 .  fluconazole (DIFLUCAN) 200 MG tablet, TAKE 1 TABLET BY MOUTH ONCE DAILY AS NEEDED YEAST MAY REPEAT IN 48 HRS, Disp: 5 tablet, Rfl: 3 .  Fluticasone-Umeclidin-Vilant (TRELEGY ELLIPTA) 100-62.5-25 MCG/INH AEPB,  Inhale 1 puff into the lungs daily., Disp: 60 each, Rfl: 6 .  guaiFENesin (MUCINEX) 600 MG 12 hr tablet, Take 2 tablets (1,200 mg total) by mouth 2 (two) times daily., Disp: 60 tablet, Rfl: 1 .  magic mouthwash w/lidocaine SOLN, Swish, gargle, and spit one to two teaspoonfuls every six hours as needed. Shake well before using., Disp: 120 mL, Rfl: 0 .  Misc. Devices (PULSE OXIMETER FOR FINGER) MISC, 1 Device by Does not apply route as needed (for cough, dyspnea). Pulse oximeter for  finger for checking oxygen saturation in COPD, Disp: 1 each, Rfl: 0 .  omeprazole (PRILOSEC) 20 MG capsule, Take 1 capsule (20 mg total) by mouth daily before breakfast., Disp: 30 capsule, Rfl: 2 .  oxyCODONE-acetaminophen (PERCOCET) 10-325 MG tablet, oxycodone-acetaminophen 10 mg-325 mg tablet  1 QIDPRN Fill on 07/30/2017, Disp: , Rfl:  .  PROAIR HFA 108 (90 Base) MCG/ACT inhaler, Inhale 2 puffs into the lungs every 6 (six) hours as needed for wheezing or shortness of breath., Disp: 18 g, Rfl: 2 .  roflumilast (DALIRESP) 500 MCG TABS tablet, Take 1 tablet (500 mcg total) by mouth daily., Disp: 30 tablet, Rfl: 3 .  levofloxacin (LEVAQUIN) 500 MG tablet, Take 1 tablet (500 mg total) by mouth daily. For 7 days, Disp: 7 tablet, Rfl: 0 .  predniSONE (DELTASONE) 20 MG tablet, Take 2 tablets daily (40mg ) for 4 days, take 1 tab daily (20mg ) for 4 days, take half tab daily (10mg ) for 2 days. Total course 10 day, Disp: 13 tablet, Rfl: 0  Depression screen [facility] 2/9 11/24/2019 10/05/2019 09/08/2019  Decreased Interest 0 0 0  Down, Depressed, Hopeless 0 0 0  PHQ - 2 Score 0 0 0  Altered sleeping 0 0 -  Tired, decreased energy 0 0 -  Change in appetite 0 0 -  Feeling bad or failure about yourself  0 0 -  Trouble concentrating 0 0 -  Moving slowly or fidgety/restless 0 0 -  Suicidal thoughts 0 0 -  PHQ-9 Score 0 0 -    No flowsheet data found.  -------------------------------------------------------------------------- O: No physical exam performed due to remote telephone encounter.  Lab results reviewed.  No results found for this or any previous visit (from the past 2160 hour(s)).  -------------------------------------------------------------------------- A&P:  Problem List Items Addressed This Visit    Panlobular emphysema ([place])   Relevant Medications   PROAIR HFA 108 (90 Base) MCG/ACT inhaler   predniSONE (DELTASONE) 20 MG tablet    Other Visit Diagnoses    COPD with acute exacerbation  ([place])    -  Primary   Relevant Medications   PROAIR HFA 108 (90 Base) MCG/ACT inhaler   levofloxacin (LEVAQUIN) 500 MG tablet   predniSONE (DELTASONE) 20 MG tablet   Oral yeast infection       Relevant Medications   fluconazole (DIFLUCAN) 200 MG tablet     # ACUTE COPD EXACERBATION / Severe COPD Clinically with similar to previous flareacute COPD exacerbation, worsening productive cough with sputum production and some dyspnea. Has system symptoms of fatigue- seems chronic longer term problem worse with COPD flare No acute history of other focal infection Reported afebrile Advanced COPD, seems to have persistent recurrent flares - last 03/15/20 - Continues on maintenance therapy inhalers/nebs Last imaging CXR 12/2019 Followed by [facility] Start Prednisone 10 day taper, 40mg  x 4 days, 20mg  x 4 days, 10mg  x 2 days = 10 day Start taking Levaquin antibiotic 500mg  daily x 7  days Re order diflucan, while using Trelegy She should f/u with [facility] for further Trelegy rx - it should have refills available. Can check with pharmacy first, I can order if need in future Order ProAir DAW brand by her request - albuterol ineffective and Ventolin had side effect intolerance F/u as planned / advised - return within 1 week if not improve can consider Chest X-ray   Meds ordered this encounter  Medications  . fluconazole (DIFLUCAN) 200 MG tablet    Sig: TAKE 1 TABLET BY MOUTH ONCE DAILY AS NEEDED YEAST MAY REPEAT IN 48 HRS    Dispense:  5 tablet    Refill:  3  . PROAIR HFA 108 (90 Base) MCG/ACT inhaler    Sig: Inhale 2 puffs into the lungs every 6 (six) hours as needed for wheezing or shortness of breath.    Dispense:  18 g    Refill:  2  . levofloxacin (LEVAQUIN) 500 MG tablet    Sig: Take 1 tablet (500 mg total) by mouth daily. For 7 days    Dispense:  7 tablet    Refill:  0  . predniSONE (DELTASONE) 20 MG tablet    Sig: Take 2 tablets daily (40mg ) for 4 days, take  1 tab daily (20mg ) for 4 days, take half tab daily (10mg ) for 2 days. Total course 10 day    Dispense:  13 tablet    Refill:  0    Follow-up: - Return in 1-2 weeks for lab results - fatigue order Vitamin B12, TSH, Vitamin D, CMET CBC   Patient verbalizes understanding with the above medical recommendations including the limitation of remote medical advice.  Specific follow-up and call-back criteria were given for patient to follow-up or seek medical care more urgently if needed.   - Time spent in direct consultation with patient on phone: 12 minutes   [name], [facility] 05/24/2020, 4:15 PM

## 2020-05-24 NOTE — Patient Instructions (Addendum)
Thank you for coming to the office today.  Levaquin, Prednisone  Diflucan as needed  ProAir brand name ordered  Check with Dr Mortimer Fries / Geraldo Pitter NP for Trelegy refills  Shoulder injection 4 weeks  DUE for FASTING BLOOD WORK (no food or drink after midnight before the lab appointment, only water or coffee without cream/sugar on the morning of)  SCHEDULE "Lab Only" visit in the morning at the clinic for lab draw in 1 WEEK  - Make sure Lab Only appointment is at about 1 week before your next appointment, so that results will be available  For Lab Results, once available within 2-3 days of blood draw, you can can log in to MyChart online to view your results and a brief explanation. Also, we can discuss results at next follow-up visit.     Please schedule a Follow-up Appointment to: Return for 1-2 weeks fasting lab only, then 4 weeks for shoulder injection.  If you have any other questions or concerns, please feel free to call the office or send a message through [place]. You may also schedule an earlier appointment if necessary.  Additionally, you may be receiving a survey about your experience at our office within a few days to 1 week by e-mail or mail. We value your feedback.  [name], DO [place]

## 2020-05-29 DIAGNOSIS — M791 Myalgia, unspecified site: Secondary | ICD-10-CM | POA: Diagnosis not present

## 2020-05-29 DIAGNOSIS — M545 Low back pain: Secondary | ICD-10-CM | POA: Diagnosis not present

## 2020-05-29 DIAGNOSIS — M25512 Pain in left shoulder: Secondary | ICD-10-CM | POA: Diagnosis not present

## 2020-05-29 DIAGNOSIS — Z79899 Other long term (current) drug therapy: Secondary | ICD-10-CM | POA: Diagnosis not present

## 2020-05-30 DIAGNOSIS — M25561 Pain in right knee: Secondary | ICD-10-CM | POA: Diagnosis not present

## 2020-05-31 ENCOUNTER — Other Ambulatory Visit: Payer: BC Managed Care – PPO

## 2020-05-31 ENCOUNTER — Telehealth: Payer: Self-pay | Admitting: Internal Medicine

## 2020-05-31 ENCOUNTER — Telehealth: Payer: Self-pay

## 2020-05-31 MED ORDER — TRELEGY ELLIPTA 100-62.5-25 MCG/INH IN AEPB: 1.0000 | INHALATION_SPRAY | Freq: Every day | RESPIRATORY_TRACT | 6 refills | Status: DC

## 2020-05-31 NOTE — Telephone Encounter (Signed)
Received PA request for Trelegy from [facility]. contacted [facility] at [phone] and was advised that PA was not needed. Spoke to [employer] with [employer] and made her aware of this information.  Nothing further is needed.

## 2020-05-31 NOTE — Telephone Encounter (Signed)
Called and spoke to pt, who is requesting refill on Trelegy to be sent to [facility].  Rx has been sent to preferred pharmacy. Nothing further is needed.

## 2020-06-05 DIAGNOSIS — M75121 Complete rotator cuff tear or rupture of right shoulder, not specified as traumatic: Secondary | ICD-10-CM | POA: Diagnosis not present

## 2020-06-07 DIAGNOSIS — Z9289 Personal history of other medical treatment: Secondary | ICD-10-CM | POA: Diagnosis not present

## 2020-06-07 DIAGNOSIS — Z1231 Encounter for screening mammogram for malignant neoplasm of breast: Secondary | ICD-10-CM | POA: Diagnosis not present

## 2020-06-07 LAB — HM MAMMOGRAPHY

## 2020-06-16 ENCOUNTER — Encounter: Payer: Self-pay | Admitting: Family Medicine

## 2020-06-21 ENCOUNTER — Ambulatory Visit: Payer: BC Managed Care – PPO | Admitting: Family Medicine

## 2020-06-21 ENCOUNTER — Telehealth (INDEPENDENT_AMBULATORY_CARE_PROVIDER_SITE_OTHER): Payer: Medicare Other | Admitting: Family Medicine

## 2020-06-21 ENCOUNTER — Other Ambulatory Visit: Payer: Self-pay

## 2020-06-21 ENCOUNTER — Encounter: Payer: Self-pay | Admitting: Family Medicine

## 2020-06-21 DIAGNOSIS — B370 Candidal stomatitis: Secondary | ICD-10-CM | POA: Diagnosis not present

## 2020-06-21 DIAGNOSIS — J441 Chronic obstructive pulmonary disease with (acute) exacerbation: Secondary | ICD-10-CM

## 2020-06-21 DIAGNOSIS — J0110 Acute frontal sinusitis, unspecified: Secondary | ICD-10-CM | POA: Diagnosis not present

## 2020-06-21 MED ORDER — MAGIC MOUTHWASH W/LIDOCAINE: ORAL | 0 refills | Status: DC

## 2020-06-21 MED ORDER — HYDROCOD POLST-CPM POLST ER 10-8 MG/5ML PO SUER: 5.0000 mL | Freq: Two times a day (BID) | ORAL | 0 refills | Status: DC | PRN

## 2020-06-21 MED ORDER — DOXYCYCLINE HYCLATE 100 MG PO TABS: 100.0000 mg | ORAL_TABLET | Freq: Two times a day (BID) | ORAL | 0 refills | Status: DC

## 2020-06-21 NOTE — Progress Notes (Signed)
Subjective:    Patient ID: Caroline Conley, female    DOB: August 03, 1966, 54 y.o.   MRN: [id number]  Caroline Conley is a 54 y.o. female presenting on 06/21/2020 for Bronchitis (getting worst from past 3 days --cough-nasal congestion -low grade fever and bodyache, went to the [place] week ago )  Patient presents for a same day appointment.  Virtual / Telehealth Encounter - Video Visit via MyChart The purpose of this virtual visit is to provide medical care while limiting exposure to the novel coronavirus (COVID19) for both patient and office staff.  Consent was obtained for remote visit:  Yes.   Answered questions that patient had about telehealth interaction:  Yes.   I discussed the limitations, risks, security and privacy concerns of performing an evaluation and management service by video/telephone. I also discussed with the patient that there may be a patient responsible charge related to this service. The patient expressed understanding and agreed to proceed.  Patient Location: Home Provider Location: [facility] (Office)   HPI  ACUTE SINUSITIS - Last visit with me virtual visit 05/24/20, similar problem AECOPD, treated with Levaquin Prednisone at that time, see prior notes for background information. - Interval update with nearly 90% resolution - Today patient reports now worse after recent beach trip, says may have developed sinus infection Followed by Pulm on - Trelegy affecting orally with thrush Request refill [facility] Magic Mouthwash uses intermittent with Diflucan Refill Tussionex, last 02/2020 - last ordered She usually does well on either Doxy or Levaquin. Denies significant dyspnea at this time - not with current COPD flare Denies any fevers, chills, sweats, body ache, headache, abdominal pain, diarrhea   Depression screen [facility] 2/9 11/24/2019 10/05/2019 09/08/2019  Decreased Interest 0 0 0  Down, Depressed, Hopeless 0 0 0  PHQ - 2 Score 0 0 0  Altered sleeping 0 0  -  Tired, decreased energy 0 0 -  Change in appetite 0 0 -  Feeling bad or failure about yourself  0 0 -  Trouble concentrating 0 0 -  Moving slowly or fidgety/restless 0 0 -  Suicidal thoughts 0 0 -  PHQ-9 Score 0 0 -    Social History   Tobacco Use  . Smoking status: Former Smoker    Packs/day: 1.00    Years: 30.00    Pack years: 30.00    Types: Cigarettes    Quit date: 03/02/2018    Years since quitting: 2.3  . Smokeless tobacco: Former Systems developer  . Tobacco comment: 0.5 to 1 ppd >30 years  Vaping Use  . Vaping Use: Never used  Substance Use Topics  . Alcohol use: No  . Drug use: No    Review of Systems Per HPI unless specifically indicated above     Objective:    There were no vitals taken for this visit.  Wt Readings from Last 3 Encounters:  03/27/20 131 lb 8 oz (59.6 kg)  03/15/20 129 lb 9.6 oz (58.8 kg)  12/31/19 130 lb 8 oz (59.2 kg)    Physical Exam   Note examination was completely remotely via video observation objective data only  Gen - well-appearing, no acute distress has some evidence of congestion nasal voice and congestion audible with her breathing. HEENT - eyes appear clear without discharge or redness Heart/Lungs - cannot examine virtually - observed no evidence of coughing or labored breathing. Abd - cannot examine virtually  Skin - face visible today- no rash Neuro - awake,  alert, oriented Psych - not anxious appearing   Results for orders placed or performed in visit on 06/16/20  HM MAMMOGRAPHY  Result Value Ref Range   HM Mammogram 0-4 Bi-Rad 0-4 Bi-Rad, Self Reported Normal      Assessment & Plan:   Problem List Items Addressed This Visit    None    Visit Diagnoses    Acute non-recurrent frontal sinusitis    -  Primary   Relevant Medications   doxycycline (VIBRA-TABS) 100 MG tablet   chlorpheniramine-HYDROcodone (TUSSIONEX PENNKINETIC ER) 10-8 MG/5ML SUER   magic mouthwash w/lidocaine SOLN   COPD with acute exacerbation (HCC)        Relevant Medications   chlorpheniramine-HYDROcodone (TUSSIONEX PENNKINETIC ER) 10-8 MG/5ML SUER   magic mouthwash w/lidocaine SOLN   Oral yeast infection       Relevant Medications   magic mouthwash w/lidocaine SOLN      Consistent with acute frontal sinusitis, likely initially viral URI vs allergic rhinitis component with worsening concern for bacterial infection.  Chronic COPD, without acute flare now but seems difficulty in resolving infections  Plan: 1. Start taking Doxycycline antibiotic 100mg  twice daily for 10 days. Take with full glass of water and stay upright for at least 30 min after taking, may be seated or standing, but should NOT lay down. This is just a safety precaution, if this medicine does not go all the way down throat well it could cause some burning discomfort to throat and esophagus. 2. Re order Tussionex cough syrup last done about 3 months ago use PRN for cough, we received PA today later - unable to cover this med, sent mychart message to patient that likely will need to be out of pocket cost for med unless new information or preferred med 3. Re order [facility] Magic Mouthwas - fax printed rx to [facility] 4. On Trelegy 5 F/u with Pulmonology Return criteria reviewed   Meds ordered this encounter  Medications  . doxycycline (VIBRA-TABS) 100 MG tablet    Sig: Take 1 tablet (100 mg total) by mouth 2 (two) times daily. For 10 days. Take with full glass of water, stay upright 30 min after taking.    Dispense:  20 tablet    Refill:  0  . chlorpheniramine-HYDROcodone (TUSSIONEX PENNKINETIC ER) 10-8 MG/5ML SUER    Sig: Take 5 mLs by mouth every 12 (twelve) hours as needed for cough.    Dispense:  140 mL    Refill:  0  . magic mouthwash w/lidocaine SOLN    Sig: Swish, gargle, and spit one to two teaspoonfuls every six hours as needed. Shake well before using.    Dispense:  120 mL    Refill:  0    1 Part viscous lidocaine 2%  1 Part Maalox  1 Part diphenhydramine  12.5 mg per 5 ml elixir 1 Part Nystatin      Follow up plan: Return in about 1 week (around 06/28/2020), or if symptoms worsen or fail to improve, for sinusitis.   Patient verbalizes understanding with the above medical recommendations including the limitation of remote medical advice.  Specific follow-up and call-back criteria were given for patient to follow-up or seek medical care more urgently if needed.  Total duration of direct patient care provided via video conference: 10 minutes   [name], [facility] 06/21/2020, 2:23 PM

## 2020-06-21 NOTE — Patient Instructions (Addendum)
Start taking Doxycycline antibiotic 100mg  twice daily for 10 days. Take with full glass of water and stay upright for at least 30 min after taking, may be seated or standing, but should NOT lay down. This is just a safety precaution, if this medicine does not go all the way down throat well it could cause some burning discomfort to throat and esophagus.  Re order Tussionex  Hold prednisone, only if worsening and COPD flare. Let me know or return to Pulm.  Will fax order [employer] Magic Mouthwash   Please schedule a Follow-up Appointment to: Return in about 1 week (around 06/28/2020), or if symptoms worsen or fail to improve, for sinusitis.  If you have any other questions or concerns, please feel free to call the office or send a message through [place]. You may also schedule an earlier appointment if necessary.  Additionally, you may be receiving a survey about your experience at our office within a few days to 1 week by e-mail or mail. We value your feedback.  [name], DO [place]

## 2020-07-09 DIAGNOSIS — J189 Pneumonia, unspecified organism: Secondary | ICD-10-CM | POA: Diagnosis not present

## 2020-07-09 DIAGNOSIS — J441 Chronic obstructive pulmonary disease with (acute) exacerbation: Secondary | ICD-10-CM | POA: Diagnosis not present

## 2020-08-02 ENCOUNTER — Ambulatory Visit: Payer: 59 | Admitting: Internal Medicine

## 2020-08-11 ENCOUNTER — Other Ambulatory Visit: Payer: Self-pay | Admitting: Family Medicine

## 2020-08-11 DIAGNOSIS — J431 Panlobular emphysema: Secondary | ICD-10-CM

## 2020-08-11 DIAGNOSIS — J441 Chronic obstructive pulmonary disease with (acute) exacerbation: Secondary | ICD-10-CM

## 2020-08-11 NOTE — Telephone Encounter (Signed)
Requested Prescriptions  Pending Prescriptions Disp Refills  . PROAIR HFA 108 (90 Base) MCG/ACT inhaler [Pharmacy Med Name: PROAIR HFA 108 (90 BASE) MCG/ACT IN] 8.5 g 0    Sig: INHALE 2 PUFFS INTO THE LUNGS EVERY 6 HOURS AS NEEDED FOR WHEEZING OR SHORTNESS OF BREATH     Pulmonology:  Beta Agonists Failed - 08/11/2020  4:22 PM      Failed - One inhaler should last at least one month. If the patient is requesting refills earlier, contact the patient to check for uncontrolled symptoms.      Passed - Valid encounter within last 12 months    Recent Outpatient Visits          1 month ago Acute non-recurrent frontal sinusitis   [facility], Devonne Doughty, DO   2 months ago COPD with acute exacerbation [facility])   [place], DO   4 months ago COPD with acute exacerbation [facility])   [place], DO   8 months ago COPD with acute exacerbation [facility])   [place], DO   10 months ago Acute exacerbation of chronic obstructive pulmonary disease (COPD) [facility]   [facility], Devonne Doughty, DO

## 2020-08-25 ENCOUNTER — Other Ambulatory Visit: Payer: Self-pay

## 2020-08-25 ENCOUNTER — Encounter: Payer: Self-pay | Admitting: Family Medicine

## 2020-08-25 ENCOUNTER — Ambulatory Visit (INDEPENDENT_AMBULATORY_CARE_PROVIDER_SITE_OTHER): Payer: Medicare Other | Admitting: Family Medicine

## 2020-08-25 VITALS — BP 106/68 | HR 86 | Temp 98.0°F | Resp 16 | Ht 66.0 in | Wt 125.0 lb

## 2020-08-25 DIAGNOSIS — L03116 Cellulitis of left lower limb: Secondary | ICD-10-CM

## 2020-08-25 DIAGNOSIS — B379 Candidiasis, unspecified: Secondary | ICD-10-CM

## 2020-08-25 DIAGNOSIS — B370 Candidal stomatitis: Secondary | ICD-10-CM

## 2020-08-25 DIAGNOSIS — L02416 Cutaneous abscess of left lower limb: Secondary | ICD-10-CM | POA: Diagnosis not present

## 2020-08-25 MED ORDER — DOXYCYCLINE HYCLATE 100 MG PO TABS: 100.0000 mg | ORAL_TABLET | Freq: Two times a day (BID) | ORAL | 0 refills | Status: DC

## 2020-08-25 MED ORDER — MUPIROCIN CALCIUM 2 % EX CREA: 1.0000 "application " | TOPICAL_CREAM | Freq: Two times a day (BID) | CUTANEOUS | 1 refills | Status: DC

## 2020-08-25 MED ORDER — FLUCONAZOLE 200 MG PO TABS: ORAL_TABLET | ORAL | 3 refills | Status: DC

## 2020-08-25 NOTE — Patient Instructions (Addendum)
Thank you for coming to the office today.  Please schedule and return for a NURSE ONLY VISIT for VACCINE - Need Regular Dose Flu Vaccine  Start taking Doxycycline antibiotic 100mg  twice daily for 7 days. Take with full glass of water and stay upright for at least 30 min after taking, may be seated or standing, but should NOT lay down. This is just a safety precaution, if this medicine does not go all the way down throat well it could cause some burning discomfort to throat and esophagus.  Start topical bactroban antibiotic  Recommend COVID booster in future, 6-8 months after your last dose.  If not improving you may need to return for re-evaluation. But if more severe worsening such as spreading redness or streaking redness, significantly larger size, persistent drainage of pus, increased pain, fevers/chills, nausea vomiting and cannot take antibiotic. If significantly worse symptoms or most of these symptoms, would recommend going straight to Hospital Emergency Dept as you may require IV antibiotics instead.     Please schedule a Follow-up Appointment to: Return if symptoms worsen or fail to improve.  If you have any other questions or concerns, please feel free to call the office or send a message through [place]. You may also schedule an earlier appointment if necessary.  Additionally, you may be receiving a survey about your experience at our office within a few days to 1 week by e-mail or mail. We value your feedback.  [name], DO [place]

## 2020-08-25 NOTE — Progress Notes (Signed)
Subjective:    Patient ID: [name], female    DOB: 1966/10/16, 54 y.o.   MRN: [id number]  Caroline Conley is a 54 y.o. female presenting on 08/25/2020 for Insect Bite (left side onset 5 days)   HPI   Left Thigh Leg Abscess / Cellulitis Reports onset acute insect bite and scratched it 5 days ago, developed redness pain swelling and drainage of pus, has one localized area. Used topical antibiotic. Admits mild tender Denies fever chills sweats nausea vomiting, bleeding, spreading redness   Depression screen [facility] 2/9 11/24/2019 10/05/2019 09/08/2019  Decreased Interest 0 0 0  Down, Depressed, Hopeless 0 0 0  PHQ - 2 Score 0 0 0  Altered sleeping 0 0 -  Tired, decreased energy 0 0 -  Change in appetite 0 0 -  Feeling bad or failure about yourself  0 0 -  Trouble concentrating 0 0 -  Moving slowly or fidgety/restless 0 0 -  Suicidal thoughts 0 0 -  PHQ-9 Score 0 0 -    Social History   Tobacco Use  . Smoking status: Former Smoker    Packs/day: 1.00    Years: 30.00    Pack years: 30.00    Types: Cigarettes    Quit date: 03/02/2018    Years since quitting: 2.4  . Smokeless tobacco: Former Systems developer  . Tobacco comment: 0.5 to 1 ppd >30 years  Vaping Use  . Vaping Use: Never used  Substance Use Topics  . Alcohol use: No  . Drug use: No    Review of Systems Per HPI unless specifically indicated above     Objective:    BP 106/68   Pulse 86   Temp 98 F (36.7 C) (Oral)   Resp 16   Ht 5\' 6"  (1.676 m)   Wt 125 lb (56.7 kg)   SpO2 96%   BMI 20.18 kg/m   Wt Readings from Last 3 Encounters:  08/25/20 125 lb (56.7 kg)  03/27/20 131 lb 8 oz (59.6 kg)  03/15/20 129 lb 9.6 oz (58.8 kg)    Physical Exam Vitals and nursing note reviewed.  Constitutional:      General: She is not in acute distress.    Appearance: She is well-developed. She is not diaphoretic.     Comments: Well-appearing, comfortable, cooperative  HENT:     Head: Normocephalic and atraumatic.  Eyes:       General:        Right eye: No discharge.        Left eye: No discharge.     Conjunctiva/sclera: Conjunctivae normal.  Cardiovascular:     Rate and Rhythm: Normal rate.  Pulmonary:     Effort: Pulmonary effort is normal.  Skin:    General: Skin is warm and dry.     Findings: Erythema (localized area of erythema with central abscess small 1-2 cm central pore with scab previuosly drained, posterior L upper thigh) present. No rash.  Neurological:     Mental Status: She is alert and oriented to person, place, and time.  Psychiatric:        Behavior: Behavior normal.     Comments: Well groomed, good eye contact, normal speech and thoughts       Results for orders placed or performed in visit on 06/16/20  HM MAMMOGRAPHY  Result Value Ref Range   HM Mammogram 0-4 Bi-Rad 0-4 Bi-Rad, Self Reported Normal      Assessment & Plan:   Problem  List Items Addressed This Visit    None    Visit Diagnoses    Cellulitis and abscess of left leg    -  Primary   Relevant Medications   doxycycline (VIBRA-TABS) 100 MG tablet   mupirocin cream (BACTROBAN) 2 %   Antibiotic-induced yeast infection       Relevant Medications   mupirocin cream (BACTROBAN) 2 %   fluconazole (DIFLUCAN) 200 MG tablet   Oral yeast infection       Relevant Medications   mupirocin cream (BACTROBAN) 2 %   fluconazole (DIFLUCAN) 200 MG tablet      Consistent with new acute left upper thigh posterior abscess with localized cellulitis Secondary to scratched bug bite based on history No extending erythema Afebrile, no systemic symptoms  Plan: 1. Start taking Doxycycline antibiotic 100mg  twice daily for 7 days. Take with full glass of water and stay upright for at least 30 min after taking, may be seated or standing, but should NOT lay down. This is just a safety precaution, if this medicine does not go all the way down throat well it could cause some burning discomfort to throat and esophagus. - Add Mupirocin cream  BID PRN 1-2 weeks - Diflucan for yeast infection secondary to abx 2. Warm compresses 3. Return precautions given if worsening F/u if not improving  Meds ordered this encounter  Medications  . doxycycline (VIBRA-TABS) 100 MG tablet    Sig: Take 1 tablet (100 mg total) by mouth 2 (two) times daily. For 7 days. Take with full glass of water, stay upright 30 min after taking.    Dispense:  14 tablet    Refill:  0  . mupirocin cream (BACTROBAN) 2 %    Sig: Apply 1 application topically 2 (two) times daily. For up to 1-2 weeks or until healed.    Dispense:  30 g    Refill:  1  . fluconazole (DIFLUCAN) 200 MG tablet    Sig: TAKE 1 TABLET BY MOUTH ONCE DAILY AS NEEDED YEAST MAY REPEAT IN 48 HRS    Dispense:  5 tablet    Refill:  3      Follow up plan: Return if symptoms worsen or fail to improve.   [name], [facility] 08/25/2020, 11:23 AM

## 2020-08-28 DIAGNOSIS — M545 Low back pain: Secondary | ICD-10-CM | POA: Diagnosis not present

## 2020-08-28 DIAGNOSIS — M791 Myalgia, unspecified site: Secondary | ICD-10-CM | POA: Diagnosis not present

## 2020-08-28 DIAGNOSIS — Z79899 Other long term (current) drug therapy: Secondary | ICD-10-CM | POA: Diagnosis not present

## 2020-08-28 DIAGNOSIS — M25512 Pain in left shoulder: Secondary | ICD-10-CM | POA: Diagnosis not present

## 2020-09-23 DIAGNOSIS — J441 Chronic obstructive pulmonary disease with (acute) exacerbation: Secondary | ICD-10-CM | POA: Diagnosis not present

## 2020-09-23 DIAGNOSIS — Z20822 Contact with and (suspected) exposure to covid-19: Secondary | ICD-10-CM | POA: Diagnosis not present

## 2020-10-02 ENCOUNTER — Encounter: Payer: Self-pay | Admitting: Podiatry

## 2020-10-02 ENCOUNTER — Other Ambulatory Visit: Payer: Self-pay

## 2020-10-02 ENCOUNTER — Ambulatory Visit (INDEPENDENT_AMBULATORY_CARE_PROVIDER_SITE_OTHER): Payer: Medicare Other | Admitting: Podiatry

## 2020-10-02 DIAGNOSIS — M778 Other enthesopathies, not elsewhere classified: Secondary | ICD-10-CM

## 2020-10-02 DIAGNOSIS — G5762 Lesion of plantar nerve, left lower limb: Secondary | ICD-10-CM

## 2020-10-02 NOTE — Progress Notes (Signed)
  Subjective:  Patient ID: Caroline Conley, female    DOB: 1966/05/08,  MRN: [id number]  Chief Complaint  Patient presents with  . Foot Pain    patient presents today for painful neuroma again left foot under 2nd 3rd toes x 2 weeks.  She says its a stabbing sharp pain and her foot feels swollen    54 y.o. female presents with the above complaint. History confirmed with patient.  Injections have been somewhat helpful but are decreasing and how long they last.  She requests another injection today.  She inquires if there is anything further beyond injection therapy that she could do.  Objective:  Physical Exam: warm, good capillary refill, no trophic changes or ulcerative lesions, normal DP and PT pulses and normal sensory exam. Left Foot: Pain on palpation to the plantar second interspace with a Mulder's click and positive Sullivan sign, pain with lateral compression of metatarsals   Previous radiographs reviewed, she has 3 screws in the left foot in the first second and third metatarsals for a bunionectomy and to Weil osteotomies Assessment:   1. Neuroma of second interspace of left foot      Plan:  Patient was evaluated and treated and all questions answered.   At her request and for pain relief, I injected the second or space of left foot to alleviate her pain, this is done with 0.5 cc each of: Kenalog 10 mg/mL, dexamethasone phosphate 4 mg/mL, 2% Xylocaine, 0.5% Marcaine plain  I discussed with her that excision of the neuroma is an option and we should evaluate this with further imaging.  We discussed an MRI initially and I had ordered this, however after reviewing her radiographs on the left foot I believe we should order a musculoskeletal ultrasound as the artifact from the metallic implants will make the MRI nondiagnostic.  We will obtain this and then she will follow-up in 1 month for surgical planning.  No follow-ups on file.

## 2020-10-04 DIAGNOSIS — D2272 Melanocytic nevi of left lower limb, including hip: Secondary | ICD-10-CM | POA: Diagnosis not present

## 2020-10-04 DIAGNOSIS — D2261 Melanocytic nevi of right upper limb, including shoulder: Secondary | ICD-10-CM | POA: Diagnosis not present

## 2020-10-04 DIAGNOSIS — Z85820 Personal history of malignant melanoma of skin: Secondary | ICD-10-CM | POA: Diagnosis not present

## 2020-10-04 DIAGNOSIS — L570 Actinic keratosis: Secondary | ICD-10-CM | POA: Diagnosis not present

## 2020-10-04 DIAGNOSIS — D225 Melanocytic nevi of trunk: Secondary | ICD-10-CM | POA: Diagnosis not present

## 2020-10-04 DIAGNOSIS — D2262 Melanocytic nevi of left upper limb, including shoulder: Secondary | ICD-10-CM | POA: Diagnosis not present

## 2020-10-09 ENCOUNTER — Telehealth (INDEPENDENT_AMBULATORY_CARE_PROVIDER_SITE_OTHER): Payer: Medicare Other | Admitting: Family Medicine

## 2020-10-09 ENCOUNTER — Other Ambulatory Visit: Payer: Self-pay

## 2020-10-09 DIAGNOSIS — J431 Panlobular emphysema: Secondary | ICD-10-CM

## 2020-10-09 DIAGNOSIS — J441 Chronic obstructive pulmonary disease with (acute) exacerbation: Secondary | ICD-10-CM | POA: Diagnosis not present

## 2020-10-09 DIAGNOSIS — B370 Candidal stomatitis: Secondary | ICD-10-CM

## 2020-10-09 MED ORDER — TRELEGY ELLIPTA 100-62.5-25 MCG/INH IN AEPB: 1.0000 | INHALATION_SPRAY | Freq: Every day | RESPIRATORY_TRACT | 6 refills | Status: DC

## 2020-10-09 MED ORDER — HYDROCOD POLST-CPM POLST ER 10-8 MG/5ML PO SUER: 5.0000 mL | Freq: Two times a day (BID) | ORAL | 0 refills | Status: DC | PRN

## 2020-10-09 MED ORDER — FLUCONAZOLE 200 MG PO TABS: ORAL_TABLET | ORAL | 3 refills | Status: DC

## 2020-10-09 MED ORDER — LEVOFLOXACIN 500 MG PO TABS: 500.0000 mg | ORAL_TABLET | Freq: Every day | ORAL | 0 refills | Status: DC

## 2020-10-09 MED ORDER — PROAIR HFA 108 (90 BASE) MCG/ACT IN AERS: INHALATION_SPRAY | RESPIRATORY_TRACT | 3 refills | Status: DC

## 2020-10-09 MED ORDER — PREDNISONE 50 MG PO TABS: 50.0000 mg | ORAL_TABLET | Freq: Every day | ORAL | 0 refills | Status: DC

## 2020-10-09 NOTE — Progress Notes (Signed)
Virtual Visit via Telephone The purpose of this virtual visit is to provide medical care while limiting exposure to the novel coronavirus (COVID19) for both patient and office staff.  Consent was obtained for phone visit:  Yes.   Answered questions that patient had about telehealth interaction:  Yes.   I discussed the limitations, risks, security and privacy concerns of performing an evaluation and management service by telephone. I also discussed with the patient that there may be a patient responsible charge related to this service. The patient expressed understanding and agreed to proceed.  Patient Location: Home Provider Location: [facility])  ---------------------------------------------------------------------- Chief Complaint  Patient presents with  . COPD  . Pneumonia    as per patient LLQ lung pain onset 3 days denies fever     S: Reviewed CMA documentation. I have called patient and gathered additional HPI as follows:  Acute COPD Exacerbation Reports symptoms started about 3 days ago, similar to prior COPD flare now worsening with persistent cough, feels painful on L side with cough. She requests a CXR. Followed by Pulm seen last for acute COPD flare 06/2020 about 3 months ago, resolved w/ Doxycycline and Prednisone. - She often uses Tussionex to help cough to breathe better with her COPD, and requests refill Diflucan uses for oral thrush from inhalers and antibiotic Refill on inhalers  Denies any known or suspected exposure to person with or possibly with COVID19.  Denies any fevers, chills, sweats, body ache, sinus pain or pressure, headache, abdominal pain, diarrhea  Past Medical History:  Diagnosis Date  . Atypical chest pain    a. 2015 Cath ([place]): Nl cors. Nl EF; b. 10/2018 MV: No ischemia/infarct. EF>65%.  . Cancer ([place]) 01/2016   basal cell   . Emphysema of lung ([place])   . Palpitations    a. 07/2018 Echo: EF 60-65%, no rwma, Gr1 DD, Nl  RV fxn; b. 07/2018 48h Holter: predominantly RSR, avg rate 106 (69-166 bpm), Rare PAC's and occas PVC's. 2 atrial runs up to 5 beats. No sustained arrhythmias or prolonged pauses. No symptoms reported.  . Wears dentures    full upper and lower   Social History   Tobacco Use  . Smoking status: Former Smoker    Packs/day: 1.00    Years: 30.00    Pack years: 30.00    Types: Cigarettes    Quit date: 03/02/2018    Years since quitting: 2.6  . Smokeless tobacco: Former Systems developer  . Tobacco comment: 0.5 to 1 ppd >30 years  Vaping Use  . Vaping Use: Never used  Substance Use Topics  . Alcohol use: No  . Drug use: No    Current Outpatient Medications:  .  albuterol (PROVENTIL) (2.5 MG/3ML) 0.083% nebulizer solution, Take 2.5 mg by nebulization every 6 (six) hours as needed., Disp: , Rfl:  .  diltiazem (DILACOR XR) 180 MG 24 hr capsule, Take 1 capsule (180 mg total) by mouth daily., Disp: 90 capsule, Rfl: 2 .  fluconazole (DIFLUCAN) 200 MG tablet, TAKE 1 TABLET BY MOUTH ONCE DAILY AS NEEDED YEAST MAY REPEAT IN 48 HRS, Disp: 5 tablet, Rfl: 3 .  Fluticasone-Umeclidin-Vilant (TRELEGY ELLIPTA) 100-62.5-25 MCG/INH AEPB, Inhale 1 puff into the lungs daily., Disp: 60 each, Rfl: 6 .  magic mouthwash w/lidocaine SOLN, Swish, gargle, and spit one to two teaspoonfuls every six hours as needed. Shake well before using., Disp: 120 mL, Rfl: 0 .  Misc. Devices (PULSE OXIMETER FOR FINGER) MISC, 1 Device by  Does not apply route as needed (for cough, dyspnea). Pulse oximeter for finger for checking oxygen saturation in COPD, Disp: 1 each, Rfl: 0 .  oxyCODONE-acetaminophen (PERCOCET) 10-325 MG tablet, Take 1 tablet by mouth every 4 (four) hours as needed for pain., Disp: , Rfl:  .  PROAIR HFA 108 (90 Base) MCG/ACT inhaler, INHALE 2 PUFFS INTO THE LUNGS EVERY 6 HOURS AS NEEDED FOR WHEEZING OR SHORTNESS OF BREATH, Disp: 8.5 g, Rfl: 3 .  chlorpheniramine-HYDROcodone (TUSSIONEX PENNKINETIC ER) 10-8 MG/5ML SUER, Take 5 mLs  by mouth every 12 (twelve) hours as needed for cough., Disp: 140 mL, Rfl: 0 .  levofloxacin (LEVAQUIN) 500 MG tablet, Take 1 tablet (500 mg total) by mouth daily. For 7 days, Disp: 7 tablet, Rfl: 0 .  predniSONE (DELTASONE) 50 MG tablet, Take 1 tablet (50 mg total) by mouth daily with breakfast., Disp: 5 tablet, Rfl: 0  Depression screen [facility] 2/9 11/24/2019 10/05/2019 09/08/2019  Decreased Interest 0 0 0  Down, Depressed, Hopeless 0 0 0  PHQ - 2 Score 0 0 0  Altered sleeping 0 0 -  Tired, decreased energy 0 0 -  Change in appetite 0 0 -  Feeling bad or failure about yourself  0 0 -  Trouble concentrating 0 0 -  Moving slowly or fidgety/restless 0 0 -  Suicidal thoughts 0 0 -  PHQ-9 Score 0 0 -    No flowsheet data found.  -------------------------------------------------------------------------- O: No physical exam performed due to remote telephone encounter.  Lab results reviewed.  No results found for this or any previous visit (from the past 2160 hour(s)).  -------------------------------------------------------------------------- A&P:  Problem List Items Addressed This Visit    Panlobular emphysema ([place])   Relevant Medications   Fluticasone-Umeclidin-Vilant (TRELEGY ELLIPTA) 100-62.5-25 MCG/INH AEPB   PROAIR HFA 108 (90 Base) MCG/ACT inhaler   predniSONE (DELTASONE) 50 MG tablet   chlorpheniramine-HYDROcodone (TUSSIONEX PENNKINETIC ER) 10-8 MG/5ML SUER    Other Visit Diagnoses    COPD with acute exacerbation ([place])    -  Primary   Relevant Medications   Fluticasone-Umeclidin-Vilant (TRELEGY ELLIPTA) 100-62.5-25 MCG/INH AEPB   PROAIR HFA 108 (90 Base) MCG/ACT inhaler   levofloxacin (LEVAQUIN) 500 MG tablet   predniSONE (DELTASONE) 50 MG tablet   chlorpheniramine-HYDROcodone (TUSSIONEX PENNKINETIC ER) 10-8 MG/5ML SUER   Other Relevant Orders   DG Chest 2 View   Oral yeast infection       Relevant Medications   fluconazole (DIFLUCAN) 200 MG tablet       # ACUTE  COPD EXACERBATION / Severe COPD Clinically with similar to previous flareacute COPD exacerbation, worsening productive coughwith sputum production and some dyspnea. Has system symptoms of fatigue- seems chronic longer term problem worse with COPD flare No acute history of other focal infection Reported afebrile Advanced COPD, seems to have persistent recurrent flares - last 06/2020 - Continues on maintenance therapy inhalers/nebs Last [facility] CX[facility]  Plan Return for CXR STAT today in afternoon to office. Will send result to her  Start Prednisone 5 day burst 50mg  daily - we may switch to extended taper if unresolved - in past sometime severe flare requires up to 10 day taper. Start taking Levaquin antibiotic 500mg  daily x 7 days Re order Tussionex for cough Re order diflucan, while using Trelegy Add refills Trelegy Order ProAir DAW brand by her request  F/u as planned / advised- return within 1 week if not improve  Meds ordered this encounter  Medications  .  Fluticasone-Umeclidin-Vilant (TRELEGY ELLIPTA) 100-62.5-25 MCG/INH AEPB    Sig: Inhale 1 puff into the lungs daily.    Dispense:  60 each    Refill:  6  . PROAIR HFA 108 (90 Base) MCG/ACT inhaler    Sig: INHALE 2 PUFFS INTO THE LUNGS EVERY 6 HOURS AS NEEDED FOR WHEEZING OR SHORTNESS OF BREATH    Dispense:  8.5 g    Refill:  3  . levofloxacin (LEVAQUIN) 500 MG tablet    Sig: Take 1 tablet (500 mg total) by mouth daily. For 7 days    Dispense:  7 tablet    Refill:  0  . predniSONE (DELTASONE) 50 MG tablet    Sig: Take 1 tablet (50 mg total) by mouth daily with breakfast.    Dispense:  5 tablet    Refill:  0  . fluconazole (DIFLUCAN) 200 MG tablet    Sig: TAKE 1 TABLET BY MOUTH ONCE DAILY AS NEEDED YEAST MAY REPEAT IN 48 HRS    Dispense:  5 tablet    Refill:  3  . chlorpheniramine-HYDROcodone (TUSSIONEX PENNKINETIC ER) 10-8 MG/5ML SUER    Sig: Take 5 mLs by mouth every 12 (twelve)  hours as needed for cough.    Dispense:  140 mL    Refill:  0    Follow-up: - Return as needed  Patient verbalizes understanding with the above medical recommendations including the limitation of remote medical advice.  Specific follow-up and call-back criteria were given for patient to follow-up or seek medical care more urgently if needed.   - Time spent in direct consultation with patient on phone: 11 minutes   [name], [facility] 10/09/2020, 11:34 AM

## 2020-10-09 NOTE — Patient Instructions (Signed)
Thank you for coming to the office today.    Please schedule a Follow-up Appointment to: No follow-ups on file.  If you have any other questions or concerns, please feel free to call the office or send a message through MyChart. You may also schedule an earlier appointment if necessary.  Additionally, you may be receiving a survey about your experience at our office within a few days to 1 week by e-mail or mail. We value your feedback.  Patryck Kilgore, DO [facility], [facility] 

## 2020-10-17 ENCOUNTER — Other Ambulatory Visit: Payer: BC Managed Care – PPO

## 2020-10-18 ENCOUNTER — Telehealth: Payer: Self-pay | Admitting: Podiatry

## 2020-10-18 ENCOUNTER — Other Ambulatory Visit: Payer: BC Managed Care – PPO

## 2020-10-18 DIAGNOSIS — G5762 Lesion of plantar nerve, left lower limb: Secondary | ICD-10-CM

## 2020-10-18 DIAGNOSIS — M778 Other enthesopathies, not elsewhere classified: Secondary | ICD-10-CM

## 2020-10-18 NOTE — Addendum Note (Signed)
Addended bySherryle Lis, Laina Guerrieri R on: 10/18/2020 01:24 PM   Modules accepted: Orders

## 2020-10-18 NOTE — Telephone Encounter (Signed)
[facility] called In stating patient called to get appointment but the provider that the order is set for is currently out on maternity leave and they are not sure when he will return. They are advising all patient to be sent elsewhere for treatment/studies, Please advise

## 2020-10-18 NOTE — Telephone Encounter (Signed)
I have re-ordered for [facility] outpatient imaging center. Thanks

## 2020-10-20 ENCOUNTER — Telehealth: Payer: Self-pay | Admitting: *Deleted

## 2020-10-24 NOTE — Telephone Encounter (Signed)
"  My number is [phone]."    I attempted to return her call.  I left her a message to call me back.

## 2020-10-30 ENCOUNTER — Ambulatory Visit: Payer: Medicare Other | Admitting: Podiatry

## 2020-11-02 NOTE — Telephone Encounter (Signed)
Per Ramond Marrow, no precert required for U/S     Patient is schedule for Monday 11/06/2020 @ 4pm Patient has been notified of appt

## 2020-11-06 ENCOUNTER — Ambulatory Visit
Admission: RE | Admit: 2020-11-06 | Discharge: 2020-11-06 | Disposition: A | Discharge status: Home / Self Care | Payer: Medicare Other | Source: Ambulatory Visit | Attending: Podiatry | Admitting: Podiatry

## 2020-11-06 ENCOUNTER — Other Ambulatory Visit: Payer: Self-pay

## 2020-11-06 DIAGNOSIS — G5762 Lesion of plantar nerve, left lower limb: Secondary | ICD-10-CM

## 2020-11-06 DIAGNOSIS — M778 Other enthesopathies, not elsewhere classified: Secondary | ICD-10-CM | POA: Insufficient documentation

## 2020-11-06 DIAGNOSIS — M79672 Pain in left foot: Secondary | ICD-10-CM | POA: Diagnosis not present

## 2020-11-07 ENCOUNTER — Other Ambulatory Visit: Payer: BC Managed Care – PPO

## 2020-11-13 ENCOUNTER — Ambulatory Visit: Payer: Medicare Other | Admitting: Podiatry

## 2020-11-15 ENCOUNTER — Encounter: Payer: Self-pay | Admitting: Podiatry

## 2020-11-15 ENCOUNTER — Other Ambulatory Visit: Payer: Self-pay

## 2020-11-15 ENCOUNTER — Ambulatory Visit (INDEPENDENT_AMBULATORY_CARE_PROVIDER_SITE_OTHER): Payer: Medicare Other | Admitting: Podiatry

## 2020-11-15 DIAGNOSIS — M778 Other enthesopathies, not elsewhere classified: Secondary | ICD-10-CM

## 2020-11-15 DIAGNOSIS — G5762 Lesion of plantar nerve, left lower limb: Secondary | ICD-10-CM

## 2020-11-15 MED ORDER — TRIAMCINOLONE ACETONIDE 40 MG/ML IJ SUSP
20.0000 mg | Freq: Once | INTRAMUSCULAR | Status: AC
  Administered 2020-11-18: 10 mg via INTRA_ARTICULAR

## 2020-11-15 MED ORDER — DEXAMETHASONE SODIUM PHOSPHATE 4 MG/ML IJ SOLN
8.0000 mg | Freq: Once | INTRAMUSCULAR | Status: AC
  Administered 2020-11-18: 8 mg via INTRA_ARTICULAR

## 2020-11-18 DIAGNOSIS — M778 Other enthesopathies, not elsewhere classified: Secondary | ICD-10-CM | POA: Diagnosis not present

## 2020-11-18 NOTE — Progress Notes (Signed)
  Subjective:  Patient ID: Caroline Conley, female    DOB: 02/17/1966,  MRN: [id number]  Chief Complaint  Patient presents with  . Neuroma    "it still really painful and the injection did not help"  here to discuss ultrasound results    54 y.o. female returns with the above complaint. History confirmed with patient.  She completed the ultrasound and is here for results  Objective:  Physical Exam: warm, good capillary refill, no trophic changes or ulcerative lesions, normal DP and PT pulses and normal sensory exam. Left Foot: Still with pain on palpation today in the second or space, the second metatarsal plantarly and the third metatarsal plantarly.  She has pain with range of motion of the second PIPJ  Study Result  Narrative & Impression  CLINICAL DATA:  Evaluate for neuroma between the second and third interspaces. Plantar pain over the ball of the second toe.  EXAM: LEFT LOWER EXTREMITY SOFT TISSUE ULTRASOUND COMPLETE  TECHNIQUE: Ultrasound examination was performed including evaluation of the muscles, tendons, joint, and adjacent soft tissues.  COMPARISON:  None.  FINDINGS: Real-time sonography of the plantar aspect of the left forefoot was performed with a high-frequency linear transducer. Attention was made to the second and third MTP joints and web space.  No solid or cystic mass is identified between the second and third MTP joints.  Small amount of fluid is seen along the plantar aspect of the second metatarsal head as can be seen with an adventitial bursitis.  No other solid or cystic mass.  IMPRESSION: 1. No sonographic evidence of a plantar neuroma. 2. Small amount of fluid is seen along the plantar aspect of the second metatarsal head as can be seen with an adventitial bursitis.   Electronically Signed   By: Kathreen Devoid   On: 11/06/2020 16:47    Assessment:   1. Capsulitis of foot, left      Plan:  Patient was evaluated and treated  and all questions answered.   Discussed with her there is no definite neuroma on ultrasound.  It is possible that she still has one that the neuroma did not catch.  Also possible is a very small 1.  We discussed going ahead with a permanent neurectomy but prior to this to consider intra-articular injections we have not tried.  She thinks this is reasonable.  Following sterile prep with Betadine and alcohol, the left second and third metatarsophalangeal joint was injected with 10 mg of Kenalog and 4 mg of dexamethasone following local field block with 1.5 cc each of 2% Xylocaine plain and 0.5% Marcaine plain.  She tolerated this well.    Return in about 1 month (around 12/16/2020) for check on injection, discuss surgery.

## 2020-11-27 DIAGNOSIS — M791 Myalgia, unspecified site: Secondary | ICD-10-CM | POA: Diagnosis not present

## 2020-11-27 DIAGNOSIS — M5451 Vertebrogenic low back pain: Secondary | ICD-10-CM | POA: Diagnosis not present

## 2020-11-27 DIAGNOSIS — Z79899 Other long term (current) drug therapy: Secondary | ICD-10-CM | POA: Diagnosis not present

## 2020-11-27 DIAGNOSIS — M25512 Pain in left shoulder: Secondary | ICD-10-CM | POA: Diagnosis not present

## 2020-12-19 ENCOUNTER — Telehealth: Payer: Self-pay

## 2020-12-19 ENCOUNTER — Ambulatory Visit (INDEPENDENT_AMBULATORY_CARE_PROVIDER_SITE_OTHER): Payer: Medicare Other

## 2020-12-19 VITALS — Ht 66.0 in | Wt 125.0 lb

## 2020-12-19 DIAGNOSIS — Z0000 Encounter for general adult medical examination without abnormal findings: Secondary | ICD-10-CM

## 2020-12-19 NOTE — Telephone Encounter (Signed)
Patient consented to telehealth visit. 

## 2020-12-19 NOTE — Patient Instructions (Signed)
Caroline Conley , Thank you for taking time to come for your Medicare Wellness Visit. I appreciate your ongoing commitment to your health goals. Please review the following plan we discussed and let me know if I can assist you in the future.   Screening recommendations/referrals: Colonoscopy: completed 07/08/2013, due 07/08/2026 Mammogram: completed 06/07/2020, due 06/07/2021 Bone Density: n/a Recommended yearly ophthalmology/optometry visit for glaucoma screening and checkup Recommended yearly dental visit for hygiene and checkup  Vaccinations: Influenza vaccine: completed 09/2020 per patient Pneumococcal vaccine: n/a Tdap vaccine: completed 03/09/2017, due 03/10/2027 Shingles vaccine: discussed  Covid-19:  04/04/2020, 03/14/2020  Advanced directives: Advance directive discussed with you today.  Conditions/risks identified: none  Next appointment: Follow up in one year for your annual wellness visit.   Preventive Care 40-64 Years, Female Preventive care refers to lifestyle choices and visits with your health care provider that can promote health and wellness. What does preventive care include?  A yearly physical exam. This is also called an annual well check.  Dental exams once or twice a year.  Routine eye exams. Ask your health care provider how often you should have your eyes checked.  Personal lifestyle choices, including:  Daily care of your teeth and gums.  Regular physical activity.  Eating a healthy diet.  Avoiding tobacco and drug use.  Limiting alcohol use.  Practicing safe sex.  Taking low-dose aspirin daily starting at age 56.  Taking vitamin and mineral supplements as recommended by your health care provider. What happens during an annual well check? The services and screenings done by your health care provider during your annual well check will depend on your age, overall health, lifestyle risk factors, and family history of disease. Counseling  Your health care  provider may ask you questions about your:  Alcohol use.  Tobacco use.  Drug use.  Emotional well-being.  Home and relationship well-being.  Sexual activity.  Eating habits.  Work and work Statistician.  Method of birth control.  Menstrual cycle.  Pregnancy history. Screening  You may have the following tests or measurements:  Height, weight, and BMI.  Blood pressure.  Lipid and cholesterol levels. These may be checked every 5 years, or more frequently if you are over 34 years old.  Skin check.  Lung cancer screening. You may have this screening every year starting at age 46 if you have a 30-pack-year history of smoking and currently smoke or have quit within the past 15 years.  Fecal occult blood test (FOBT) of the stool. You may have this test every year starting at age 52.  Flexible sigmoidoscopy or colonoscopy. You may have a sigmoidoscopy every 5 years or a colonoscopy every 10 years starting at age 49.  Hepatitis C blood test.  Hepatitis B blood test.  Sexually transmitted disease (STD) testing.  Diabetes screening. This is done by checking your blood sugar (glucose) after you have not eaten for a while (fasting). You may have this done every 1-3 years.  Mammogram. This may be done every 1-2 years. Talk to your health care provider about when you should start having regular mammograms. This may depend on whether you have a family history of breast cancer.  BRCA-related cancer screening. This may be done if you have a family history of breast, ovarian, tubal, or peritoneal cancers.  Pelvic exam and Pap test. This may be done every 3 years starting at age 31. Starting at age 56, this may be done every 5 years if you have a Pap test  in combination with an HPV test.  Bone density scan. This is done to screen for osteoporosis. You may have this scan if you are at high risk for osteoporosis. Discuss your test results, treatment options, and if necessary, the need  for more tests with your health care provider. Vaccines  Your health care provider may recommend certain vaccines, such as:  Influenza vaccine. This is recommended every year.  Tetanus, diphtheria, and acellular pertussis (Tdap, Td) vaccine. You may need a Td booster every 10 years.  Zoster vaccine. You may need this after age 31.  Pneumococcal 13-valent conjugate (PCV13) vaccine. You may need this if you have certain conditions and were not previously vaccinated.  Pneumococcal polysaccharide (PPSV23) vaccine. You may need one or two doses if you smoke cigarettes or if you have certain conditions. Talk to your health care provider about which screenings and vaccines you need and how often you need them. This information is not intended to replace advice given to you by your health care provider. Make sure you discuss any questions you have with your health care provider. Document Released: 12/29/2015 Document Revised: 08/21/2016 Document Reviewed: 10/03/2015 Elsevier Interactive Patient Education  2017 [place] Prevention in the Home Falls can cause injuries. They can happen to people of all ages. There are many things you can do to make your home safe and to help prevent falls. What can I do on the outside of my home?  Regularly fix the edges of walkways and driveways and fix any cracks.  Remove anything that might make you trip as you walk through a door, such as a raised step or threshold.  Trim any bushes or trees on the path to your home.  Use bright outdoor lighting.  Clear any walking paths of anything that might make someone trip, such as rocks or tools.  Regularly check to see if handrails are loose or broken. Make sure that both sides of any steps have handrails.  Any raised decks and porches should have guardrails on the edges.  Have any leaves, snow, or ice cleared regularly.  Use sand or salt on walking paths during winter.  Clean up any spills in  your garage right away. This includes oil or grease spills. What can I do in the bathroom?  Use night lights.  Install grab bars by the toilet and in the tub and shower. Do not use towel bars as grab bars.  Use non-skid mats or decals in the tub or shower.  If you need to sit down in the shower, use a plastic, non-slip stool.  Keep the floor dry. Clean up any water that spills on the floor as soon as it happens.  Remove soap buildup in the tub or shower regularly.  Attach bath mats securely with double-sided non-slip rug tape.  Do not have throw rugs and other things on the floor that can make you trip. What can I do in the bedroom?  Use night lights.  Make sure that you have a light by your bed that is easy to reach.  Do not use any sheets or blankets that are too big for your bed. They should not hang down onto the floor.  Have a firm chair that has side arms. You can use this for support while you get dressed.  Do not have throw rugs and other things on the floor that can make you trip. What can I do in the kitchen?  Clean up any  spills right away.  Avoid walking on wet floors.  Keep items that you use a lot in easy-to-reach places.  If you need to reach something above you, use a strong step stool that has a grab bar.  Keep electrical cords out of the way.  Do not use floor polish or wax that makes floors slippery. If you must use wax, use non-skid floor wax.  Do not have throw rugs and other things on the floor that can make you trip. What can I do with my stairs?  Do not leave any items on the stairs.  Make sure that there are handrails on both sides of the stairs and use them. Fix handrails that are broken or loose. Make sure that handrails are as long as the stairways.  Check any carpeting to make sure that it is firmly attached to the stairs. Fix any carpet that is loose or worn.  Avoid having throw rugs at the top or bottom of the stairs. If you do have  throw rugs, attach them to the floor with carpet tape.  Make sure that you have a light switch at the top of the stairs and the bottom of the stairs. If you do not have them, ask someone to add them for you. What else can I do to help prevent falls?  Wear shoes that:  Do not have high heels.  Have rubber bottoms.  Are comfortable and fit you well.  Are closed at the toe. Do not wear sandals.  If you use a stepladder:  Make sure that it is fully opened. Do not climb a closed stepladder.  Make sure that both sides of the stepladder are locked into place.  Ask someone to hold it for you, if possible.  Clearly mark and make sure that you can see:  Any grab bars or handrails.  First and last steps.  Where the edge of each step is.  Use tools that help you move around (mobility aids) if they are needed. These include:  Canes.  Walkers.  Scooters.  Crutches.  Turn on the lights when you go into a dark area. Replace any light bulbs as soon as they burn out.  Set up your furniture so you have a clear path. Avoid moving your furniture around.  If any of your floors are uneven, fix them.  If there are any pets around you, be aware of where they are.  Review your medicines with your doctor. Some medicines can make you feel dizzy. This can increase your chance of falling. Ask your doctor what other things that you can do to help prevent falls. This information is not intended to replace advice given to you by your health care provider. Make sure you discuss any questions you have with your health care provider. Document Released: 09/28/2009 Document Revised: 05/09/2016 Document Reviewed: 01/06/2015 Elsevier Interactive Patient Education  2017 [employer].

## 2020-12-19 NOTE — Progress Notes (Signed)
I connected with  Caroline Conley today via telehealth video enabled device and verified that I am speaking with the correct person using two identifiers.   Location: Patient: home Provider: home  Persons participating in virtual visit: patient, provider  I discussed the limitations, risks, security and privacy concerns of performing an evaluation and management service by telephone and the availability of in person appointments. The patient expressed understanding and agreed to proceed.   Some vital signs may be absent or patient reported.       Subjective:   Caroline Conley is a 55 y.o. female who presents for an Initial Medicare Annual Wellness Visit.  Review of Systems     Cardiac Risk Factors include: dyslipidemia;sedentary lifestyle     Objective:    Today's Vitals   12/19/20 1332 12/19/20 1333  Weight: 125 lb (56.7 kg)   Height: 5\' 6"  (1.676 m)   PainSc:  8    Body mass index is 20.18 kg/m.  Advanced Directives 12/19/2020 05/22/2018 03/09/2017 07/08/2016 04/25/2016 10/21/2011 10/17/2011  Does Patient Have a Medical Advance Directive? No No No No No Patient has advance directive, copy not in chart Patient would like information  Type of Advance Directive - - - - - Living will;Healthcare Power of Attorney -  Copy of Healthcare Power of Attorney in Chart? - - - - - Copy requested from family -  Would patient like information on creating a medical advance directive? - No - Patient declined No - Patient declined No - patient declined information No - patient declined information - Advance directive packet given  Pre-existing out of facility DNR order (yellow form or pink MOST form) - - - - - No -    Current Medications (verified) Outpatient Encounter Medications as of 12/19/2020  Medication Sig  . albuterol (PROVENTIL) (2.5 MG/3ML) 0.083% nebulizer solution Take 2.5 mg by nebulization every 6 (six) hours as needed.  . chlorpheniramine-HYDROcodone (TUSSIONEX PENNKINETIC ER) 10-8  MG/5ML SUER Take 5 mLs by mouth every 12 (twelve) hours as needed for cough.  . diltiazem (DILACOR XR) 180 MG 24 hr capsule Take 1 capsule (180 mg total) by mouth daily.  . fluconazole (DIFLUCAN) 200 MG tablet TAKE 1 TABLET BY MOUTH ONCE DAILY AS NEEDED YEAST MAY REPEAT IN 48 HRS  . Fluticasone-Umeclidin-Vilant (TRELEGY ELLIPTA) 100-62.5-25 MCG/INH AEPB Inhale 1 puff into the lungs daily.  . magic mouthwash w/lidocaine SOLN Swish, gargle, and spit one to two teaspoonfuls every six hours as needed. Shake well before using.  . Misc. Devices (PULSE OXIMETER FOR FINGER) MISC 1 Device by Does not apply route as needed (for cough, dyspnea). Pulse oximeter for finger for checking oxygen saturation in COPD  . oxyCODONE-acetaminophen (PERCOCET) 10-325 MG tablet Take 1 tablet by mouth every 4 (four) hours as needed for pain.  Marland Kitchen PROAIR HFA 108 (90 Base) MCG/ACT inhaler INHALE 2 PUFFS INTO THE LUNGS EVERY 6 HOURS AS NEEDED FOR WHEEZING OR SHORTNESS OF BREATH  . levofloxacin (LEVAQUIN) 500 MG tablet Take 1 tablet (500 mg total) by mouth daily. For 7 days (Patient not taking: Reported on 12/19/2020)  . predniSONE (DELTASONE) 50 MG tablet Take 1 tablet (50 mg total) by mouth daily with breakfast. (Patient not taking: Reported on 12/19/2020)   No facility-administered encounter medications on file as of 12/19/2020.    Allergies (verified) 2,4-d dimethylamine (amisol); Morphine and related; and Vicodin [hydrocodone-acetaminophen]   History: Past Medical History:  Diagnosis Date  . Atypical chest pain    a.  2015 Cath [facility]): Nl cors. Nl EF; b. 10/2018 MV: No ischemia/infarct. EF>65%.  . Cancer ([place]) 01/2016   basal cell   . Emphysema of lung ([place])   . Palpitations    a. 07/2018 Echo: EF 60-65%, no rwma, Gr1 DD, Nl RV fxn; b. 07/2018 48h Holter: predominantly RSR, avg rate 106 (69-166 bpm), Rare PAC's and occas PVC's. 2 atrial runs up to 5 beats. No sustained arrhythmias or prolonged pauses. No symptoms reported.   . Wears dentures    full upper and lower   Past Surgical History:  Procedure Laterality Date  . ABDOMINAL HYSTERECTOMY    . APPENDECTOMY    . BACK SURGERY     this will make third time  . CARDIAC CATHETERIZATION  09/20/14   [facility] - Dr. Clayborn Bigness  . COLONOSCOPY WITH PROPOFOL N/A 07/08/2016   Procedure: COLONOSCOPY WITH PROPOFOL;  Surgeon: Lucilla Lame, MD;  Location: [facility];  Service: Endoscopy;  Laterality: N/A;  . DEBRIDEMENT TENNIS ELBOW    . METATARSAL OSTEOTOMY  05/18/2013  . ROTATOR CUFF REPAIR    . TONSILLECTOMY     Family History  Problem Relation Age of Onset  . Diabetes Mother   . High blood pressure Mother   . Gout Mother   . Cancer Father   . Stroke Father   . Arthritis/Rheumatoid Father   . Diabetes Father   . High blood pressure Father   . Heart failure Father   . Heart failure Brother   . Diabetes Brother    Social History   Socioeconomic History  . Marital status: Married    Spouse name: Not on file  . Number of children: Not on file  . Years of education: Not on file  . Highest education level: Not on file  Occupational History  . Occupation: disability  Tobacco Use  . Smoking status: Former Smoker    Packs/day: 1.00    Years: 30.00    Pack years: 30.00    Types: Cigarettes    Quit date: 03/02/2018    Years since quitting: 2.8  . Smokeless tobacco: Former Systems developer  . Tobacco comment: 0.5 to 1 ppd >30 years  Vaping Use  . Vaping Use: Never used  Substance and Sexual Activity  . Alcohol use: No  . Drug use: Yes    Types: Oxycodone  . Sexual activity: Yes  Other Topics Concern  . Not on file  Social History Narrative  . Not on file   Social Determinants of Health   Financial Resource Strain: Low Risk   . Difficulty of Paying Living Expenses: Not hard at all  Food Insecurity: No Food Insecurity  . Worried About Charity fundraiser in the Last Year: Never true  . Ran Out of Food in the Last Year: Never true  Transportation Needs:  No Transportation Needs  . Lack of Transportation (Medical): No  . Lack of Transportation (Non-Medical): No  Physical Activity: Inactive  . Days of Exercise per Week: 0 days  . Minutes of Exercise per Session: 0 min  Stress: No Stress Concern Present  . Feeling of Stress : Not at all  Social Connections: Not on file    Tobacco Counseling Counseling given: Not Answered Comment: 0.5 to 1 ppd >30 years   Clinical Intake:  Pre-visit preparation completed: Yes  Pain : 0-10 Pain Score: 8  Pain Type: Acute pain Pain Location: Generalized Pain Descriptors / Indicators: Aching Pain Onset: In the past 7 days Pain Frequency:  Constant     Nutritional Status: BMI of 19-24  Normal Nutritional Risks: Nausea/ vomitting/ diarrhea (nausea has been exposed to covid) Diabetes: No  How often do you need to have someone help you when you read instructions, pamphlets, or other written materials from your doctor or pharmacy?: 1 - Never What is the last grade level you completed in school?: 8th grade  Diabetic? no  Interpreter Needed?: No  Information entered by :: NAllen LPN   Activities of Daily Living In your present state of health, do you have any difficulty performing the following activities: 12/19/2020  Hearing? N  Vision? N  Difficulty concentrating or making decisions? N  Walking or climbing stairs? N  Dressing or bathing? N  Doing errands, shopping? N  Preparing Food and eating ? N  Using the Toilet? N  In the past six months, have you accidently leaked urine? N  Do you have problems with loss of bowel control? N  Managing your Medications? N  Managing your Finances? N  Housekeeping or managing your Housekeeping? N  Some recent data might be hidden    Patient Care Team: Olin Hauser, DO as PCP - General (Family Medicine) End, Harrell Gave, MD as PCP - Cardiology (Cardiology)  Indicate any recent Medical Services you may have received from other than [facility]  providers in the past year (date may be approximate).     Assessment:   This is a routine wellness examination for [place].  Hearing/Vision screen No exam data present  Dietary issues and exercise activities discussed: Current Exercise Habits: The patient does not participate in regular exercise at present  Goals    . Patient Stated     12/19/2020, stay alive    . Quit smoking / using tobacco      Depression Screen PHQ 2/9 Scores 12/19/2020 11/24/2019 10/05/2019 09/08/2019 05/19/2019 03/30/2019 03/02/2019  PHQ - 2 Score 0 0 0 0 0 0 0  PHQ- 9 Score - 0 0 - - - -    Fall Risk Fall Risk  12/19/2020 11/24/2019 10/05/2019 09/08/2019 05/19/2019  Falls in the past year? 0 0 0 0 0  Number falls in past yr: - 0 - - -  Risk for fall due to : Medication side effect - - - -  Follow up Education provided;Falls prevention discussed;Falls evaluation completed Falls evaluation completed - Falls evaluation completed Falls evaluation completed    FALL RISK PREVENTION PERTAINING TO THE HOME:  Any stairs in or around the home? Yes  If so, are there any without handrails? No  Home free of loose throw rugs in walkways, pet beds, electrical cords, etc? Yes  Adequate lighting in your home to reduce risk of falls? Yes   ASSISTIVE DEVICES UTILIZED TO PREVENT FALLS:  Life alert? No  Use of a cane, walker or w/c? No  Grab bars in the bathroom? Yes  Shower chair or bench in shower? Yes  Elevated toilet seat or a handicapped toilet? Yes   TIMED UP AND GO:  Was the test performed? No . .    Cognitive Function:     6CIT Screen 12/19/2020  What Year? 0 points  What month? 0 points  What time? 3 points  Count back from 20 0 points  Months in reverse 0 points  Repeat phrase 6 points  Total Score 9    Immunizations Immunization History  Administered Date(s) Administered  . Influenza Inj Mdck Quad With Preservative 10/22/2019  . Influenza,inj,Quad PF,6+ Mos 08/29/2016, 08/06/2017,  10/20/2018  .  Influenza,inj,quad, With Preservative 09/15/2017, 09/16/2019  . PFIZER SARS-COV-2 Vaccination 03/14/2020, 04/04/2020  . Pneumococcal-Unspecified 09/15/2017  . Tdap 08/29/2016, 03/09/2017  . Unspecified SARS-COV-2 Vaccination 04/04/2020    TDAP status: Up to date  Flu Vaccine status: Up to date  Pneumococcal vaccine status: Up to date  Covid-19 vaccine status: Completed vaccines  Qualifies for Shingles Vaccine? Yes   Zostavax completed No   Shingrix Completed?: No.    Education has been provided regarding the importance of this vaccine. Patient has been advised to call insurance company to determine out of pocket expense if they have not yet received this vaccine. Advised may also receive vaccine at local pharmacy or Health Dept. Verbalized acceptance and understanding.  Screening Tests Health Maintenance  Topic Date Due  . Hepatitis C Screening  Never done  . COVID-19 Vaccine (3 - Pfizer risk 4-dose series) 05/02/2020  . INFLUENZA VACCINE  07/16/2020  . MAMMOGRAM  06/07/2022  . COLONOSCOPY (Pts 45-20yrs Insurance coverage will need to be confirmed)  07/08/2026  . TETANUS/TDAP  03/10/2027  . HIV Screening  Completed  . PAP SMEAR-Modifier  Discontinued    Health Maintenance  Health Maintenance Due  Topic Date Due  . Hepatitis C Screening  Never done  . COVID-19 Vaccine (3 - Pfizer risk 4-dose series) 05/02/2020  . INFLUENZA VACCINE  07/16/2020    Colorectal cancer screening: Type of screening: Colonoscopy. Completed 07/08/2016. Repeat every 10 years  Mammogram status: Completed 06/07/2020. Repeat every year  Bone Density status: n/a  Lung Cancer Screening: (Low Dose CT Chest recommended if Age 40-80 years, 30 pack-year currently smoking OR have quit w/in 15years.) does not qualify.   Lung Cancer Screening Referral: no  Additional Screening:  Hepatitis C Screening: does qualify; due  Vision Screening: Recommended annual ophthalmology exams for early detection of  glaucoma and other disorders of the eye. Is the patient up to date with their annual eye exam?  Yes  Who is the provider or what is the name of the office in which the patient attends annual eye exams? [facility] If pt is not established with a provider, would they like to be referred to a provider to establish care? No .   Dental Screening: Recommended annual dental exams for proper oral hygiene  Community Resource Referral / Chronic Care Management: CRR required this visit?  No   CCM required this visit?  No      Plan:     I have personally reviewed and noted the following in the patient's chart:   . Medical and social history . Use of alcohol, tobacco or illicit drugs  . Current medications and supplements . Functional ability and status . Nutritional status . Physical activity . Advanced directives . List of other physicians . Hospitalizations, surgeries, and ER visits in previous 12 months . Vitals . Screenings to include cognitive, depression, and falls . Referrals and appointments  In addition, I have reviewed and discussed with patient certain preventive protocols, quality metrics, and best practice recommendations. A written personalized care plan for preventive services as well as general preventive health recommendations were provided to patient.     Barb Merino, LPN   [id number]/0/9811   Nurse Notes:

## 2020-12-20 ENCOUNTER — Encounter: Payer: Self-pay | Admitting: Podiatry

## 2020-12-20 ENCOUNTER — Other Ambulatory Visit: Payer: Self-pay

## 2020-12-20 ENCOUNTER — Ambulatory Visit (INDEPENDENT_AMBULATORY_CARE_PROVIDER_SITE_OTHER): Payer: Medicare Other | Admitting: Podiatry

## 2020-12-20 ENCOUNTER — Ambulatory Visit (INDEPENDENT_AMBULATORY_CARE_PROVIDER_SITE_OTHER): Payer: Medicare Other

## 2020-12-20 DIAGNOSIS — G5762 Lesion of plantar nerve, left lower limb: Secondary | ICD-10-CM | POA: Diagnosis not present

## 2020-12-20 DIAGNOSIS — M778 Other enthesopathies, not elsewhere classified: Secondary | ICD-10-CM

## 2020-12-20 DIAGNOSIS — M7752 Other enthesopathy of left foot: Secondary | ICD-10-CM

## 2020-12-20 NOTE — Progress Notes (Signed)
  Subjective:  Patient ID: Caroline Conley, female    DOB: Jan 25, 1966,  MRN: [id number]  Chief Complaint  Patient presents with  . Plantar Fasciitis    55 y.o. female returns with the above complaint. History confirmed with patient.  Joint injections were not helpful  Objective:  Physical Exam: warm, good capillary refill, no trophic changes or ulcerative lesions, normal DP and PT pulses and normal sensory exam. Left Foot: Still with pain on palpation today in the second inter-space, the second metatarsal plantarly and the third metatarsal plantarly.  She has pain with range of motion of the second PIPJ  Study Result  Narrative & Impression  CLINICAL DATA:  Evaluate for neuroma between the second and third interspaces. Plantar pain over the ball of the second toe.  EXAM: LEFT LOWER EXTREMITY SOFT TISSUE ULTRASOUND COMPLETE  TECHNIQUE: Ultrasound examination was performed including evaluation of the muscles, tendons, joint, and adjacent soft tissues.  COMPARISON:  None.  FINDINGS: Real-time sonography of the plantar aspect of the left forefoot was performed with a high-frequency linear transducer. Attention was made to the second and third MTP joints and web space.  No solid or cystic mass is identified between the second and third MTP joints.  Small amount of fluid is seen along the plantar aspect of the second metatarsal head as can be seen with an adventitial bursitis.  No other solid or cystic mass.  IMPRESSION: 1. No sonographic evidence of a plantar neuroma. 2. Small amount of fluid is seen along the plantar aspect of the second metatarsal head as can be seen with an adventitial bursitis.   Electronically Signed   By: Elige Ko   On: 11/06/2020 16:47   Weightbearing views of the left foot were taken, there are no significant changes from last exam Assessment:   1. Capsulitis of foot, left      Plan:  Patient was evaluated and treated and  all questions answered.  At this point she has not had any success with injections in the interspaces, or intra-articular injections in the second or third toes.  Clinically her symptoms are most fitting of a second interspace neuroma.  I do think is possible that the ultrasound did not catch this.  Previously discussed surgical excision of the digital nerve which would lead to permanent numbness but may offer some pain relief.  We discussed the risks of this including infection, wound healing complications, stump neuroma formation as well as pain, swelling, infection, scar, numbness which may be temporary or permanent, chronic pain, stiffness, nerve pain or damage.  Despite this she would like to proceed with surgery.  Informed consent was signed and reviewed.  Surgery be scheduled mutually agreeable date   Surgical plan:  Procedure: -Neurectomy with microscopic implantation into muscle left second interspace  Location: -[facility] specialty surgical [facility]  Anesthesia plan: -IV sedation with local anesthesia  Postoperative pain plan: -Tylenol 1000 mg every 6 hours, ibuprofen 600 mg every 6 hours, gabapentin 300 mg every 8 hours x5 days, oxycodone 5 mg 1-2 tabs every 6 hours only as needed  DVT prophylaxis: -None required she will be WBAT  WB Restrictions / DME needs: -WBAT in surgical shoe, this will be dispensed at surgical center    No follow-ups on file.

## 2021-01-05 ENCOUNTER — Encounter: Payer: Self-pay | Admitting: Family Medicine

## 2021-01-05 ENCOUNTER — Telehealth (INDEPENDENT_AMBULATORY_CARE_PROVIDER_SITE_OTHER): Payer: 59 | Admitting: Family Medicine

## 2021-01-05 ENCOUNTER — Other Ambulatory Visit: Payer: Self-pay

## 2021-01-05 DIAGNOSIS — B370 Candidal stomatitis: Secondary | ICD-10-CM

## 2021-01-05 DIAGNOSIS — J441 Chronic obstructive pulmonary disease with (acute) exacerbation: Secondary | ICD-10-CM | POA: Diagnosis not present

## 2021-01-05 MED ORDER — PREDNISONE 50 MG PO TABS: 50.0000 mg | ORAL_TABLET | Freq: Every day | ORAL | 0 refills | Status: DC

## 2021-01-05 MED ORDER — MAGIC MOUTHWASH W/LIDOCAINE: ORAL | 0 refills | Status: DC

## 2021-01-05 MED ORDER — HYDROCOD POLST-CPM POLST ER 10-8 MG/5ML PO SUER: 5.0000 mL | Freq: Two times a day (BID) | ORAL | 0 refills | Status: DC | PRN

## 2021-01-05 MED ORDER — DOXYCYCLINE HYCLATE 100 MG PO TABS: 100.0000 mg | ORAL_TABLET | Freq: Two times a day (BID) | ORAL | 0 refills | Status: DC

## 2021-01-05 NOTE — Addendum Note (Signed)
Addended by: Wilson Singer on: 01/05/2021 10:57 AM   Modules accepted: Orders

## 2021-01-05 NOTE — Progress Notes (Signed)
Virtual Visit via Telephone The purpose of this virtual visit is to provide medical care while limiting exposure to the novel coronavirus (COVID19) for both patient and office staff.  Consent was obtained for phone visit:  Yes.   Answered questions that patient had about telehealth interaction:  Yes.   I discussed the limitations, risks, security and privacy concerns of performing an evaluation and management service by telephone. I also discussed with the patient that there may be a patient responsible charge related to this service. The patient expressed understanding and agreed to proceed.  Patient Location: Home Provider Location: [facility] (Office)  Participants in virtual visit: - Patient: [name] - CMA: Orinda Kenner, CMA - Provider: Dr Parks Ranger  ---------------------------------------------------------------------- Chief Complaint  Patient presents with  . Cough    Productive cough w/ yellowish phelgm, chest congestion x 4 days     S: Reviewed CMA documentation. I have called patient and gathered additional HPI as follows:  COPD EXACERBATION / COUGH Reports that symptoms started 4 days ago with cough, productive yellow sputum. Difficulty getting sputum out of chest with congestion. Using Albuterol nebulizer, ProAir, Trelegy, Dalirasp Followed by Pulmonology currently Received 1st 2 doses of COVID vaccine, waiting on booster, not yet received. Denies any known or suspected exposure to person with or possibly with COVID19.   Denies any fevers, chills, sweats, body ache, sinus pain or pressure, headache, abdominal pain, diarrhea  Past Medical History:  Diagnosis Date  . Atypical chest pain    a. 2015 Cath ([place]): Nl cors. Nl EF; b. 10/2018 MV: No ischemia/infarct. EF>65%.  . Cancer ([place]) 01/2016   basal cell   . Emphysema of lung ([place])   . Palpitations    a. 07/2018 Echo: EF 60-65%, no rwma, Gr1 DD, Nl RV fxn; b. 07/2018 48h Holter:  predominantly RSR, avg rate 106 (69-166 bpm), Rare PAC's and occas PVC's. 2 atrial runs up to 5 beats. No sustained arrhythmias or prolonged pauses. No symptoms reported.  . Wears dentures    full upper and lower   Social History   Tobacco Use  . Smoking status: Former Smoker    Packs/day: 1.00    Years: 30.00    Pack years: 30.00    Types: Cigarettes    Quit date: 03/02/2018    Years since quitting: 2.8  . Smokeless tobacco: Former Systems developer  . Tobacco comment: 0.5 to 1 ppd >30 years  Vaping Use  . Vaping Use: Never used  Substance Use Topics  . Alcohol use: No  . Drug use: Yes    Types: Oxycodone    Current Outpatient Medications:  .  albuterol (PROVENTIL) (2.5 MG/3ML) 0.083% nebulizer solution, Take 2.5 mg by nebulization every 6 (six) hours as needed., Disp: , Rfl:  .  diltiazem (DILACOR XR) 180 MG 24 hr capsule, Take 1 capsule (180 mg total) by mouth daily., Disp: 90 capsule, Rfl: 2 .  doxycycline (VIBRA-TABS) 100 MG tablet, Take 1 tablet (100 mg total) by mouth 2 (two) times daily. For 10 days. Take with full glass of water, stay upright 30 min after taking., Disp: 20 tablet, Rfl: 0 .  Fluticasone-Umeclidin-Vilant (TRELEGY ELLIPTA) 100-62.5-25 MCG/INH AEPB, Inhale 1 puff into the lungs daily., Disp: 60 each, Rfl: 6 .  Misc. Devices (PULSE OXIMETER FOR FINGER) MISC, 1 Device by Does not apply route as needed (for cough, dyspnea). Pulse oximeter for finger for checking oxygen saturation in COPD, Disp: 1 each, Rfl: 0 .  oxyCODONE-acetaminophen (  PERCOCET) 10-325 MG tablet, Take 1 tablet by mouth every 4 (four) hours as needed for pain., Disp: , Rfl:  .  predniSONE (DELTASONE) 50 MG tablet, Take 1 tablet (50 mg total) by mouth daily with breakfast., Disp: 5 tablet, Rfl: 0 .  PROAIR HFA 108 (90 Base) MCG/ACT inhaler, INHALE 2 PUFFS INTO THE LUNGS EVERY 6 HOURS AS NEEDED FOR WHEEZING OR SHORTNESS OF BREATH, Disp: 8.5 g, Rfl: 3 .  chlorpheniramine-HYDROcodone (TUSSIONEX PENNKINETIC ER) 10-8  MG/5ML SUER, Take 5 mLs by mouth every 12 (twelve) hours as needed for cough., Disp: 140 mL, Rfl: 0 .  magic mouthwash w/lidocaine SOLN, Swish, gargle, and spit one to two teaspoonfuls every six hours as needed. Shake well before using., Disp: 120 mL, Rfl: 0  Depression screen [facility] 2/9 12/19/2020 11/24/2019 10/05/2019  Decreased Interest 0 0 0  Down, Depressed, Hopeless 0 0 0  PHQ - 2 Score 0 0 0  Altered sleeping - 0 0  Tired, decreased energy - 0 0  Change in appetite - 0 0  Feeling bad or failure about yourself  - 0 0  Trouble concentrating - 0 0  Moving slowly or fidgety/restless - 0 0  Suicidal thoughts - 0 0  PHQ-9 Score - 0 0    No flowsheet data found.  -------------------------------------------------------------------------- O: No physical exam performed due to remote telephone encounter.  Lab results reviewed.  No results found for this or any previous visit (from the past 2160 hour(s)).  -------------------------------------------------------------------------- A&P:  Problem List Items Addressed This Visit   None   Visit Diagnoses    COPD with acute exacerbation ([place])    -  Primary   Relevant Medications   doxycycline (VIBRA-TABS) 100 MG tablet   magic mouthwash w/lidocaine SOLN   predniSONE (DELTASONE) 50 MG tablet   chlorpheniramine-HYDROcodone (TUSSIONEX PENNKINETIC ER) 10-8 MG/5ML SUER   Oral yeast infection       Relevant Medications   magic mouthwash w/lidocaine SOLN     # ACUTE COPD EXACERBATION / Severe COPD Clinically with similar to previous flareacute COPD exacerbation, worsening productive coughwith sputum production and some dyspnea. Has system symptoms of fatigue- seems chronic longer term problem worse with COPD flare Vaccinated x 2 doses for COVID, awaiting booster Reported afebrile. No flu like or other viral syndrome Advanced COPD, seems to have persistent recurrent flares- last 09/2020 - Continues on maintenance therapy  inhalers/nebs Last CXR 09/[facility]  Plan Start taking Doxycycline antibiotic 100mg  twice daily for 10 days. Take with full glass of water and stay upright for at least 30 min after taking, may be seated or standing, but should NOT lay down. This is just a safety precaution, if this medicine does not go all the way down throat well it could cause some burning discomfort to throat and esophagus.  Start Prednisone 5 day burst 50mg  daily - we may switch to extended taper if unresolved - in past sometime severe flare requires up to 10 day taper. Re order Tussionex for cough Re order Magic Mouthwash - fax to [facility] test. Notify of result.  F/u as planned / advised- return within 1 week if not improve  Meds ordered this encounter  Medications  . doxycycline (VIBRA-TABS) 100 MG tablet    Sig: Take 1 tablet (100 mg total) by mouth 2 (two) times daily. For 10 days. Take with full glass of water, stay upright 30 min after taking.    Dispense:  20  tablet    Refill:  0  . magic mouthwash w/lidocaine SOLN    Sig: Swish, gargle, and spit one to two teaspoonfuls every six hours as needed. Shake well before using.    Dispense:  120 mL    Refill:  0    1 Part viscous lidocaine 2%  1 Part Maalox  1 Part diphenhydramine 12.5 mg per 5 ml elixir 1 Part Nystatin  . predniSONE (DELTASONE) 50 MG tablet    Sig: Take 1 tablet (50 mg total) by mouth daily with breakfast.    Dispense:  5 tablet    Refill:  0  . chlorpheniramine-HYDROcodone (TUSSIONEX PENNKINETIC ER) 10-8 MG/5ML SUER    Sig: Take 5 mLs by mouth every 12 (twelve) hours as needed for cough.    Dispense:  140 mL    Refill:  0    Follow-up: - Return in 1-2 week if unresolved  Patient verbalizes understanding with the above medical recommendations including the limitation of remote medical advice.  Specific follow-up and call-back criteria were given for patient to follow-up or seek  medical care more urgently if needed.   - Time spent in direct consultation with patient on phone: 11 minutes  [name], [facility] 01/05/2021, 8:03 AM

## 2021-01-05 NOTE — Patient Instructions (Signed)
° °  Please schedule a Follow-up Appointment to: No follow-ups on file. ° °If you have any other questions or concerns, please feel free to call the office or send a message through MyChart. You may also schedule an earlier appointment if necessary. ° °Additionally, you may be receiving a survey about your experience at our office within a few days to 1 week by e-mail or mail. We value your feedback. ° °Jeydi Klingel, DO °[facility], [facility] °

## 2021-01-26 ENCOUNTER — Telehealth: Payer: Self-pay | Admitting: Podiatry

## 2021-01-26 NOTE — Telephone Encounter (Signed)
DOS: 02/02/2021  Procedures: Neuroma Excision Lt [phone]) & Implantation of Nerves Lt (62263)  UHC Medicare Dual Complete Effective From 12/16/2020 - 12/15/2021  Deductible: $335 with $0 met and $203 remaining. Out of Pocket: $0 CoInsurance: 20% Copay: $0  Notification or Prior Authorization is not required for the requested services  Decision ID #:[id number]  The number above acknowledges your inquiry and our response. Please write this number down and refer to it for future inquiries. Coverage and payment for an item or service is governed by the member's benefit plan document, and, if applicable, the provider's participation agreement with the Health Plan.  Notification is not a verification, guarantee of benefits, or clinical determination. Payment of services is based on your participation agreement with [place] and the enrollee's benefit plan at the time services are provided.  A Notification may be considered late if not submitted within one business day after the date of admission or submitted per your participation agreement. Please reference your agreement for further information in this regard.  [facility] Medicaid Effective From 06/15/2020 - 02/12/2021

## 2021-01-29 ENCOUNTER — Telehealth: Payer: Self-pay

## 2021-01-29 NOTE — Telephone Encounter (Signed)
DOS 02/02/2021   EXC NEUROMA LT - 64776 IMPLANTATION NERVE ENDS LT - 64787  UHC MEDICARE EFFECTIVE DATE - 12/16/2020  PLAN DEDUCTIBLE - $203.00 W/ $203.00 REMAINING OUT OF POCKET - OOP MAX HAS NO LIMIT COPAY $0.00 COINSURANCE - 20%   NOTIFICATION/PRIOR AUTHORIZATION NUMBER CASE STATUS CASE STATUS REASON PRIMARY CARE PHYSICIAN [id number] Closed Case Was Managed And Is Now Complete [name] ADVANCE NOTIFY DATE/TIME ADMISSION NOTIFY DATE/TIME 01/26/2021 10:09 AM CST - COVERAGE STATUS OVERALL COVERAGE STATUS Covered/Approved 1-2 CODE DESCRIPTION COVERAGE STATUS DECISION DATE FAC [facility] Coverage determination is reflected for the facility admission and is not a guarantee of payment for ongoing services. Covered/Approved 01/26/2021 1 64787 Implantation of nerve end into bone or m more Covered/Approved 01/26/2021 2 [id number] Excision of neuroma; digital nerve, 1 or more Covered/Approved 01/26/2021

## 2021-02-02 ENCOUNTER — Other Ambulatory Visit: Payer: Self-pay | Admitting: Podiatry

## 2021-02-02 ENCOUNTER — Telehealth: Payer: Self-pay

## 2021-02-02 DIAGNOSIS — G5762 Lesion of plantar nerve, left lower limb: Secondary | ICD-10-CM | POA: Diagnosis not present

## 2021-02-02 MED ORDER — IBUPROFEN 600 MG PO TABS: 600.0000 mg | ORAL_TABLET | Freq: Four times a day (QID) | ORAL | 0 refills | Status: DC | PRN

## 2021-02-02 MED ORDER — IBUPROFEN 600 MG PO TABS: 600.0000 mg | ORAL_TABLET | Freq: Four times a day (QID) | ORAL | 0 refills | Status: AC | PRN

## 2021-02-02 MED ORDER — OXYCODONE-ACETAMINOPHEN 10-325 MG PO TABS: 1.0000 | ORAL_TABLET | Freq: Four times a day (QID) | ORAL | 0 refills | Status: DC | PRN

## 2021-02-02 MED ORDER — OXYCODONE-ACETAMINOPHEN 10-325 MG PO TABS: 1.0000 | ORAL_TABLET | Freq: Four times a day (QID) | ORAL | 0 refills | Status: AC | PRN

## 2021-02-02 NOTE — Telephone Encounter (Signed)
Sent!

## 2021-02-02 NOTE — Progress Notes (Signed)
Post op meds re-sent to [facility]

## 2021-02-02 NOTE — Progress Notes (Signed)
02/02/21 [place] neurectomy

## 2021-02-02 NOTE — Telephone Encounter (Signed)
Pt called and stated that the pharmacy listed is no longer in her network and she would like scripts to be sent to [facility].

## 2021-02-07 ENCOUNTER — Encounter: Payer: Self-pay | Admitting: Podiatry

## 2021-02-07 ENCOUNTER — Other Ambulatory Visit: Payer: Self-pay

## 2021-02-07 ENCOUNTER — Ambulatory Visit (INDEPENDENT_AMBULATORY_CARE_PROVIDER_SITE_OTHER): Payer: 59 | Admitting: Podiatry

## 2021-02-07 VITALS — BP 106/66 | HR 93 | Temp 98.5°F

## 2021-02-07 DIAGNOSIS — G5762 Lesion of plantar nerve, left lower limb: Secondary | ICD-10-CM

## 2021-02-07 DIAGNOSIS — Z9889 Other specified postprocedural states: Secondary | ICD-10-CM

## 2021-02-07 NOTE — Progress Notes (Signed)
She presents today for her first postop visit she is a patient of Dr. Sherryle Lis date of surgery February 02, 2021 status post neurectomy second interdigital space left foot.  She denies fever chills nausea vomiting muscle aches pains calf pain back pain chest pain shortness of breath.  Objective: She presents today with her cam walker dry sterile compressive dressing was removed demonstrates dressing was dry there is no erythema to some mild edema no cellulitis drainage or odor sutures are intact margins are well coapted slight diastases between the second and third toes but overall doing very well.  Assessment well-healing surgical foot status post neurectomy second interdigital space x1 week.  Plan: Redressed today dressed a compressive dressing will follow up with Dr. Sherryle Lis in 1 week.  I did dispense a Darco shoe at patient's request.

## 2021-02-14 ENCOUNTER — Encounter: Payer: Medicare Other | Admitting: Podiatry

## 2021-02-21 ENCOUNTER — Encounter: Payer: Self-pay | Admitting: Podiatry

## 2021-02-21 ENCOUNTER — Ambulatory Visit (INDEPENDENT_AMBULATORY_CARE_PROVIDER_SITE_OTHER): Payer: 59 | Admitting: Podiatry

## 2021-02-21 ENCOUNTER — Other Ambulatory Visit: Payer: Self-pay

## 2021-02-21 DIAGNOSIS — Z9889 Other specified postprocedural states: Secondary | ICD-10-CM

## 2021-02-21 DIAGNOSIS — G5762 Lesion of plantar nerve, left lower limb: Secondary | ICD-10-CM

## 2021-02-21 NOTE — Progress Notes (Signed)
  Subjective:  Patient ID: Caroline Conley, female    DOB: 14-Mar-1966,  MRN: [id number]  Chief Complaint  Patient presents with  . Routine Post Op     POV #2 DOS 02/02/2021 NEURECTOMY LT FOOT BETWEEN 2ND & 3RD TOES  "its doing better"   Sutures removed today    DOS: 02/02/2021 Procedure: Second interspace neurectomy for Morton's neuroma  55 y.o. female returns for post-op check.  She is doing well has been WBAT in the CAM boot  Review of Systems: Negative except as noted in the HPI. Denies N/V/F/Ch.   Objective:  There were no vitals filed for this visit. There is no height or weight on file to calculate BMI. Constitutional Well developed. Well nourished.  Vascular Foot warm and well perfused. Capillary refill normal to all digits.   Neurologic Normal speech. Oriented to person, place, and time. Epicritic sensation to light touch grossly present bilaterally.  Dermatologic Skin healing well without signs of infection. Skin edges well coapted without signs of infection.  Orthopedic:  Minimal tenderness to palpation noted about the surgical site.   Pathology results [facility]: 02/02/2021 benign nerve tissue consistent with Morton's neuroma  Assessment:  No diagnosis found. Plan:  Patient was evaluated and treated and all questions answered.  S/p foot surgery left -Progressing as expected post-operatively. -WB Status: WBAT in regular shoe gear -Sutures: All sutures removed today. -Medications: No refills required -May begin bathing regularly and return to regular shoe gear  Return if symptoms worsen or fail to improve.

## 2021-02-28 ENCOUNTER — Encounter: Payer: Medicare Other | Admitting: Podiatry

## 2021-03-07 DIAGNOSIS — J441 Chronic obstructive pulmonary disease with (acute) exacerbation: Secondary | ICD-10-CM | POA: Diagnosis not present

## 2021-03-10 ENCOUNTER — Telehealth: Payer: Self-pay | Admitting: Family Medicine

## 2021-03-10 DIAGNOSIS — J431 Panlobular emphysema: Secondary | ICD-10-CM

## 2021-03-10 DIAGNOSIS — J441 Chronic obstructive pulmonary disease with (acute) exacerbation: Secondary | ICD-10-CM

## 2021-03-10 DIAGNOSIS — B370 Candidal stomatitis: Secondary | ICD-10-CM

## 2021-03-10 NOTE — Telephone Encounter (Signed)
ProAir last ordered 10/09/20. Approved per protocol.  Refusing Diflucan-not appropriate to refill.

## 2021-03-10 NOTE — Telephone Encounter (Signed)
Refusing Diflucan. No recent order and no indication for this medication documented at this time on the patient's chart.

## 2021-03-11 NOTE — Telephone Encounter (Signed)
Late entry to 03/10/21 Diflucan refill request. Last ordered 01/05/21. Patient reported Trilogy causes Thrush in her mouth and needs Diflucan and also requesting [facility] Magic Mouthwash stating she would alternate using the two medicines. Plans to talk to Pulmonology regarding Trilogy.

## 2021-03-12 MED ORDER — MAGIC MOUTHWASH W/LIDOCAINE: ORAL | 0 refills | Status: DC

## 2021-03-12 MED ORDER — FLUCONAZOLE 150 MG PO TABS: ORAL_TABLET | ORAL | 3 refills | Status: DC

## 2021-03-12 NOTE — Addendum Note (Signed)
Addended by: Olin Hauser on: 03/12/2021 09:05 AM   Modules accepted: Orders

## 2021-04-02 ENCOUNTER — Encounter: Payer: Self-pay | Admitting: Family Medicine

## 2021-04-02 ENCOUNTER — Ambulatory Visit (INDEPENDENT_AMBULATORY_CARE_PROVIDER_SITE_OTHER): Payer: 59 | Admitting: Family Medicine

## 2021-04-02 ENCOUNTER — Other Ambulatory Visit: Payer: Self-pay

## 2021-04-02 VITALS — BP 94/72 | HR 108 | Ht 66.0 in | Wt 135.4 lb

## 2021-04-02 DIAGNOSIS — M7551 Bursitis of right shoulder: Secondary | ICD-10-CM

## 2021-04-02 DIAGNOSIS — M25511 Pain in right shoulder: Secondary | ICD-10-CM

## 2021-04-02 DIAGNOSIS — Z9889 Other specified postprocedural states: Secondary | ICD-10-CM

## 2021-04-02 MED ORDER — METHYLPREDNISOLONE ACETATE 40 MG/ML IJ SUSP
40.0000 mg | Freq: Once | INTRAMUSCULAR | Status: AC
  Administered 2021-04-02: 40 mg via INTRA_ARTICULAR

## 2021-04-02 MED ORDER — LIDOCAINE HCL (PF) 1 % IJ SOLN
4.0000 mL | Freq: Once | INTRAMUSCULAR | Status: AC
  Administered 2021-04-02: 4 mL

## 2021-04-02 NOTE — Patient Instructions (Addendum)
Thank you for coming to the office today.  You received a Right Shoulder Joint steroid injection today. - Lidocaine numbing medicine may ease the pain initially for a few hours until it wears off - As discussed, you may experience a "steroid flare" this evening or within 24-48 hours, anytime medicine is injected into an inflamed joint it can cause the pain to get worse temporarily - Everyone responds differently to these injections, it depends on the patient and the severity of the joint problem, it may provide anywhere from days to weeks, to months of relief. Ideal response is >6 months relief - Try to take it easy for next 1-2 days, avoid over activity and strain on joint (limit lifting for shoulder) - Recommend the following:   - For swelling - rest, compression sleeve / ACE wrap, elevation, and ice packs as needed for first few days   - For pain in future may use heating pad or moist heat as needed  Please schedule a Follow-up Appointment to: Return if symptoms worsen or fail to improve.  If you have any other questions or concerns, please feel free to call the office or send a message through [place]. You may also schedule an earlier appointment if necessary.  Additionally, you may be receiving a survey about your experience at our office within a few days to 1 week by e-mail or mail. We value your feedback.  [name], DO [place]

## 2021-04-02 NOTE — Progress Notes (Signed)
Subjective:    Patient ID: Caroline Conley, female    DOB: [birth year], 55 y.o.   MRN: [id number]  Caroline Conley is a 55 y.o. female presenting on 04/02/2021 for Shoulder Pain   HPI   FOLLOW-UP SHOULDER PAIN / history of chronic R frozen shoulder and prior rotator cuff surgery  - Last visit with me for this problem with steroid injection R shoulder back in 01/01/18, same problem, see prior notes for background information. - Interval update with improved R shoulder pain from bursitis for past 3 years. Gradual worsening again. - Today patient reports ready for new injection. Known chronic problem for few years after her R shoulder surgery, seems to hurt worse post-op than before, has had frozen shoulder, in past received steroid subacromial injections into bursa with good results. - Requesting new injection today, she was followed by [facility] in [place] in past. - Taking NSAID and chronic pain meds percocet with some relief - Her range of motion has never improved post op due to frozen shoulder - No new fall or injury - Her left shoulder is much improved on medicine since last visit, still has flare ups bursitis occasionally - Denies any redness, bruising, swelling, numbness, tingling, weakness   Depression screen [facility] 2/9 12/19/2020 11/24/2019 10/05/2019  Decreased Interest 0 0 0  Down, Depressed, Hopeless 0 0 0  PHQ - 2 Score 0 0 0  Altered sleeping - 0 0  Tired, decreased energy - 0 0  Change in appetite - 0 0  Feeling bad or failure about yourself  - 0 0  Trouble concentrating - 0 0  Moving slowly or fidgety/restless - 0 0  Suicidal thoughts - 0 0  PHQ-9 Score - 0 0    Social History   Tobacco Use  . Smoking status: Former Smoker    Packs/day: 1.00    Years: 30.00    Pack years: 30.00    Types: Cigarettes    Quit date: 03/02/2018    Years since quitting: 3.0  . Smokeless tobacco: Former Systems developer  . Tobacco comment: 0.5 to 1 ppd >30 years  Vaping Use  . Vaping Use: Never  used  Substance Use Topics  . Alcohol use: No  . Drug use: Yes    Types: Oxycodone    Review of Systems Per HPI unless specifically indicated above     Objective:    BP 94/72   Pulse (!) 108   Ht 5\' 6"  (1.676 m)   Wt 135 lb 6.4 oz (61.4 kg)   SpO2 99%   BMI 21.85 kg/m   Wt Readings from Last 3 Encounters:  04/02/21 135 lb 6.4 oz (61.4 kg)  12/19/20 125 lb (56.7 kg)  08/25/20 125 lb (56.7 kg)    Physical Exam Vitals and nursing note reviewed.  Constitutional:      General: She is not in acute distress.    Appearance: She is well-developed. She is not diaphoretic.     Comments: Well-appearing, comfortable, cooperative  HENT:     Head: Normocephalic and atraumatic.  Eyes:     General:        Right eye: No discharge.        Left eye: No discharge.     Conjunctiva/sclera: Conjunctivae normal.  Cardiovascular:     Rate and Rhythm: Normal rate.  Pulmonary:     Effort: Pulmonary effort is normal.  Musculoskeletal:     Comments: Right Shoulder Reduced ROM forward flexion and some  reduced abduction above shoulder. No focal rotator cuff weakness, but has pain. Impingement positive  Skin:    General: Skin is warm and dry.     Findings: No erythema or rash.  Neurological:     Mental Status: She is alert and oriented to person, place, and time.  Psychiatric:        Behavior: Behavior normal.     Comments: Well groomed, good eye contact, normal speech and thoughts      ________________________________________________________ PROCEDURE NOTE Date: 04/02/21 Right Shoulder subacromial joint injection Discussed benefits and risks (including pain, bleeding, infection, steroid flare). Verbal consent given by patient. Medication:  1 cc Depo-medrol 40mg  and 4 cc Lidocaine 1% without epi Time Out taken  Landmarks identified. Area cleansed with alcohol wipes. Using 21 gauge and 1, 1/2 inch needle, Right subacromial bursa space was injected (with above listed medication) via  posterior approach cold spray used for superficial anesthetic. Sterile bandage placed. Patient tolerated procedure well without bleeding or paresthesias. No complications.    Results for orders placed or performed in visit on 06/16/20  HM MAMMOGRAPHY  Result Value Ref Range   HM Mammogram 0-4 Bi-Rad 0-4 Bi-Rad, Self Reported Normal      Assessment & Plan:   Problem List Items Addressed This Visit    Shoulder joint pain - Primary   Chronic bursitis of right shoulder    Other Visit Diagnoses    History of repair of right rotator cuff       Relevant Medications   lidocaine (PF) (XYLOCAINE) 1 % injection 4 mL (Completed)   methylPREDNISolone acetate (DEPO-MEDROL) injection 40 mg (Completed)      Consistent with subacute on chronic R-shoulder bursitis with known rotator cuff tendinopathy, s/p repair and frozen shoulder complication - worse with repetitive activities, triggers bursitis - limited ROM significantly. - Last imaging L shoulder x-ray she has had prior R shoulder imaging > 3 years ago at [facility]  Last steroid injection R shoulder subacromial done by me 12/2017  Plan: Right shoulder subacromial steroid injection performed today, see procedure note for details. Continue current meds for pain management no med changes  Relative rest but keep shoulder mobile, demonstrated ROM exercises, avoid heavy lifting  Follow-up 4-6 weeks if not improved for re-evaluation, consider referral to Physical Therapy, X-rays and likely return back to [facility], may need glenohumeral injection via US guidance   Meds ordered this encounter  Medications  . lidocaine (PF) (XYLOCAINE) 1 % injection 4 mL  . methylPREDNISolone acetate (DEPO-MEDROL) injection 40 mg     Follow up plan: Return if symptoms worsen or fail to improve.    [name], [facility] 04/02/2021, 2:41 PM

## 2021-04-17 ENCOUNTER — Ambulatory Visit: Payer: Medicaid Other | Admitting: Orthopaedic Surgery

## 2021-04-29 DIAGNOSIS — J441 Chronic obstructive pulmonary disease with (acute) exacerbation: Secondary | ICD-10-CM | POA: Diagnosis not present

## 2021-04-29 DIAGNOSIS — Z20822 Contact with and (suspected) exposure to covid-19: Secondary | ICD-10-CM | POA: Diagnosis not present

## 2021-05-02 ENCOUNTER — Telehealth: Payer: Self-pay | Admitting: Internal Medicine

## 2021-05-02 ENCOUNTER — Other Ambulatory Visit: Payer: Self-pay | Admitting: Family Medicine

## 2021-05-02 DIAGNOSIS — J441 Chronic obstructive pulmonary disease with (acute) exacerbation: Secondary | ICD-10-CM

## 2021-05-02 DIAGNOSIS — J431 Panlobular emphysema: Secondary | ICD-10-CM

## 2021-05-02 NOTE — Telephone Encounter (Signed)
Lm for patient.  Patient is past due for an appointment.

## 2021-05-02 NOTE — Telephone Encounter (Signed)
Requested medication (s) are due for refill today: no  Requested medication (s) are on the active medication list: yes  Last refill: 5/18/222  Future visit scheduled: no  Notes to clinic:One inhaler should last at least one month. If the patient is requesting refills earlier, contact the patient to check for uncontrolled symptoms    Requested Prescriptions  Pending Prescriptions Disp Refills   PROAIR HFA 108 (90 Base) MCG/ACT inhaler [Pharmacy Med Name: PROAIR HFA 108 (90 BASE) MCG/ACT IN] 8.5 g 2    Sig: INHALE 2 PUFFS BY MOUTH EVERY 6 HOURS ASNEEDED WHEEZING/ SHORTNESS OF BREATH      Pulmonology:  Beta Agonists Failed - 05/02/2021 10:17 AM      Failed - One inhaler should last at least one month. If the patient is requesting refills earlier, contact the patient to check for uncontrolled symptoms.      Passed - Valid encounter within last 12 months    Recent Outpatient Visits           1 month ago Pain in joint of right shoulder   [place], Devonne Doughty, DO   3 months ago COPD with acute exacerbation [facility])   [facility], Devonne Doughty, DO   6 months ago COPD with acute exacerbation [facility])   [facility], Devonne Doughty, DO   8 months ago Cellulitis and abscess of left leg   [place], DO   10 months ago Acute non-recurrent frontal sinusitis   [place], DO       Future Appointments             In 7 months [facility], [facility]

## 2021-05-03 ENCOUNTER — Other Ambulatory Visit: Payer: Self-pay | Admitting: Orthopedic Surgery

## 2021-05-03 DIAGNOSIS — M25511 Pain in right shoulder: Secondary | ICD-10-CM

## 2021-05-03 MED ORDER — ROFLUMILAST 500 MCG PO TABS: 500.0000 ug | ORAL_TABLET | Freq: Every day | ORAL | 0 refills | Status: DC

## 2021-05-03 NOTE — Telephone Encounter (Signed)
appt scheduled for 06/06/2021 at 1:45. Once month supply Daliresp 500 has been sent to preferred pharmacy.  Nothing further needed at this time.

## 2021-05-12 ENCOUNTER — Other Ambulatory Visit: Payer: Self-pay

## 2021-05-12 ENCOUNTER — Ambulatory Visit
Admission: RE | Admit: 2021-05-12 | Discharge: 2021-05-12 | Disposition: A | Discharge status: Home / Self Care | Payer: 59 | Source: Ambulatory Visit | Attending: Orthopedic Surgery | Admitting: Orthopedic Surgery

## 2021-05-12 DIAGNOSIS — M25511 Pain in right shoulder: Secondary | ICD-10-CM | POA: Insufficient documentation

## 2021-05-17 DIAGNOSIS — H5213 Myopia, bilateral: Secondary | ICD-10-CM | POA: Diagnosis not present

## 2021-05-23 ENCOUNTER — Other Ambulatory Visit: Payer: Self-pay | Admitting: Orthopedic Surgery

## 2021-05-25 DIAGNOSIS — H5213 Myopia, bilateral: Secondary | ICD-10-CM | POA: Diagnosis not present

## 2021-05-30 DIAGNOSIS — H5213 Myopia, bilateral: Secondary | ICD-10-CM | POA: Diagnosis not present

## 2021-06-05 ENCOUNTER — Encounter: Payer: Self-pay | Admitting: Orthopedic Surgery

## 2021-06-06 ENCOUNTER — Ambulatory Visit (INDEPENDENT_AMBULATORY_CARE_PROVIDER_SITE_OTHER): Payer: 59 | Admitting: Internal Medicine

## 2021-06-06 ENCOUNTER — Telehealth: Payer: Self-pay | Admitting: Family Medicine

## 2021-06-06 ENCOUNTER — Other Ambulatory Visit: Payer: Self-pay

## 2021-06-06 ENCOUNTER — Encounter: Payer: Self-pay | Admitting: Internal Medicine

## 2021-06-06 ENCOUNTER — Telehealth: Payer: Self-pay | Admitting: Internal Medicine

## 2021-06-06 VITALS — BP 100/68 | HR 121 | Temp 98.2°F | Ht 66.0 in | Wt 134.0 lb

## 2021-06-06 DIAGNOSIS — J441 Chronic obstructive pulmonary disease with (acute) exacerbation: Secondary | ICD-10-CM

## 2021-06-06 MED ORDER — LEVOFLOXACIN 500 MG PO TABS: 500.0000 mg | ORAL_TABLET | Freq: Every day | ORAL | 1 refills | Status: AC

## 2021-06-06 MED ORDER — PREDNISONE 20 MG PO TABS: 40.0000 mg | ORAL_TABLET | Freq: Every day | ORAL | 1 refills | Status: DC

## 2021-06-06 MED ORDER — ROFLUMILAST 500 MCG PO TABS: 500.0000 ug | ORAL_TABLET | Freq: Every day | ORAL | 5 refills | Status: DC

## 2021-06-06 NOTE — Patient Instructions (Signed)
Levaquin 500 mg daily for 7 days Pred 40 mg daily for 10 days Continue inhalers as prescribed Nebulizer as prescribed

## 2021-06-06 NOTE — Telephone Encounter (Signed)
Pt seen her lung doctor dr Mortimer Fries today and per pt he can not write rx for tussinex due to its a controlled substance . Pt has copd flare up and would like tussinex sent to . [facility] [address] [place]

## 2021-06-06 NOTE — Telephone Encounter (Signed)
PA request was received from (pharmacy): [facility] Phone:[phone] Fax: [phone] Medication name and strength: Daliresp 500 Ordering Provider: Dr. Mortimer Fries  Was PA started with [facility]?: yes If yes, please enter KEY: [id number] Medication tried and failed: Spiriva, Symbicort, Pulmicort and Advair Covered Alternatives: N/A  PA sent to plan, time frame for approval / denial: 48-72hr Routing to traige for follow-up

## 2021-06-06 NOTE — Progress Notes (Signed)
   Name: Caroline Conley MRN: [id number] DOB: 10-23-1966    CONSULTATION DATE:  06/06/2021   REFERRING MD : [name]    Previous PFT 2017 ratio 18%, Fev1 2.2L 74% fef25/75 0.78L 22%-moderate obstructive airways disease CT of the chest reviewed with patient and family at last OV  there is no lung masses noted however there is areas of emphysema in the upper lobe region bilaterally  CC: Follow up COPD    HISTORY OF PRESENT ILLNESS: + COPD exacerbation Feeling better And increased wheeze and increased shortness of breath  Will treat with prednisone antibiotics  Continues to take inhalers as prescribed Continues to take Daliresp  Exposed to secondhand smoke exposure    Allergies  Allergen Reactions   2,4-D Dimethylamine (Amisol) Itching    (Pt unsure of this allergy)   Morphine And Related Itching   Vicodin [Hydrocodone-Acetaminophen] Nausea And Vomiting    GI distress      Review of Systems:  Gen:  Denies  fever, sweats, chills weight loss  HEENT: Denies blurred vision, double vision, ear pain, eye pain, hearing loss, nose bleeds, sore throat Cardiac:  No dizziness, chest pain or heaviness, chest tightness,edema, No JVD Resp:   +cough, -sputum production, +shortness of breath,+wheezing, -hemoptysis,  Gi: Denies swallowing difficulty, stomach pain, nausea or vomiting, diarrhea, constipation, Other:  All other systems negative  BP 100/68 (BP Location: Left Arm, Patient Position: Sitting, Cuff Size: Normal)   Pulse (!) 121   Temp 98.2 F (36.8 C) (Oral)   Ht 5\' 6"  (1.676 m)   Wt 134 lb (60.8 kg)   SpO2 97%   BMI 21.63 kg/m   Physical Examination:   General Appearance: No distress  Neuro:without focal findings,  speech normal,  HEENT: PERRLA, EOM intact.   Pulmonary: normal breath sounds, +wheezing.  CardiovascularNormal S1,S2.  No m/r/g.     ALL OTHER ROS ARE NEGATIVE     CT chest 08/2019 1.Moderate centrilobular emphysema.  Emphysema  (ICD10-J43.9).   2. Diffuse bilateral bronchial wall thickening and mucous plugging, particularly in the lower lobes, consistent with nonspecific infectious or inflammatory bronchitis.   3.  Aortic Atherosclerosis (ICD10-I70.0).     ASSESSMENT AND PLAN  55 year old white female with moderate to severe COPD Gold stage D  Previous history and recurrent bouts of COPD exacerbation in the setting of constant exposure to secondhand smoke, currently has another bout of COPD exacerbation  COPD Gold stage D  Continue inhalers as prescribed  Daliresp 500 mg daily Continue Symbicort Spiriva 1.25   COPD exacerbation Prednisone 40 mg daily for 10 days Levaquin 500 mg daily for 7 days  Patient advised to avoid secondhand smoke exposure   MEDICATION ADJUSTMENTS/LABS AND TESTS ORDERED: DALIRESP SPIRIIVA 1.25  Prednisone 40 mg for 10 days Levaquin 500 mg for 7 days  CURRENT MEDICATIONS REVIEWED AT LENGTH WITH PATIENT TODAY    Follow up in 6 months  Total time spent 25 minutes  Nea Gittens [name], M.D.  [name]  Medical Director [place] Director [facility] Cardio-Pulmonary Department

## 2021-06-07 MED ORDER — HYDROCOD POLST-CPM POLST ER 10-8 MG/5ML PO SUER: 5.0000 mL | Freq: Two times a day (BID) | ORAL | 0 refills | Status: DC | PRN

## 2021-06-07 NOTE — Telephone Encounter (Signed)
Pt called stating that she will be going out of town today at [place] and is requesting to have PCP send this in today. Please advise.

## 2021-06-07 NOTE — Telephone Encounter (Signed)
Would you be able to call her to notify rx Tussionex was sent to [facility]?  [name], [facility] 06/07/2021, 11:57 AM

## 2021-06-07 NOTE — Telephone Encounter (Signed)
Per CMM--PA has been approved.  [place] is aware of approval.  Nothing further needed.

## 2021-06-07 NOTE — Telephone Encounter (Signed)
Had to leave a message letting her know that it was sent.

## 2021-06-08 ENCOUNTER — Encounter: Payer: Self-pay | Admitting: Anesthesiology

## 2021-06-08 NOTE — Progress Notes (Signed)
Pre Anesthesia chart reviewed for [place].  Pt with moderate to severe COPD with recent exacerbation.  05/2021 pulm note "...increased wheeze and increased shortness of breath.  Will treat with prednisone antibiotics..."  This shoulder scope (with IS Block) should be done at hospital.

## 2021-06-15 ENCOUNTER — Encounter
Admission: RE | Admit: 2021-06-15 | Discharge: 2021-06-15 | Disposition: A | Discharge status: Home / Self Care | Payer: 59 | Source: Ambulatory Visit | Attending: Orthopedic Surgery | Admitting: Orthopedic Surgery

## 2021-06-15 ENCOUNTER — Other Ambulatory Visit: Payer: Self-pay

## 2021-06-15 NOTE — Patient Instructions (Addendum)
Your procedure is scheduled on: 06/22/21 Report to [facility]. To find out your arrival time please call [phone] between 1PM - 3PM on 06/21/21 .  Remember: Instructions that are not followed completely may result in serious medical risk, up to and including death, or upon the discretion of your surgeon and anesthesiologist your surgery may need to be rescheduled.     _X__ 1. Do not eat food after midnight the night before your procedure.                 No gum chewing or hard candies. You may drink clear liquids up to 2 hours                 before you are scheduled to arrive for your surgery- DO not drink clear                 liquids within 2 hours of the start of your surgery.                 Clear Liquids include:  water, apple juice without pulp, clear carbohydrate                 drink such as Clearfast or Gatorade, Black Coffee or Tea (Do not add                 anything to coffee or tea). Diabetics water only  __X__2.  On the morning of surgery brush your teeth with toothpaste and water, you                 may rinse your mouth with mouthwash if you wish.  Do not swallow any              toothpaste of mouthwash.     _X__ 3.  No Alcohol for 24 hours before or after surgery.   _X__ 4.  Do Not Smoke or use e-cigarettes For 24 Hours Prior to Your Surgery.                 Do not use any chewable tobacco products for at least 6 hours prior to                 surgery.  ____  5.  Bring all medications with you on the day of surgery if instructed.   __X__  6.  Notify your doctor if there is any change in your medical condition      (cold, fever, infections).     Do not wear jewelry, make-up, hairpins, clips or nail polish. Do not wear lotions, powders, or perfumes.  Do not shave 48 hours prior to surgery. Men may shave face and neck. Do not bring valuables to the hospital.    [facility] is not responsible for any belongings or  valuables.  Contacts, dentures/partials or body piercings may not be worn into surgery. Bring a case for your contacts, glasses or hearing aids, a denture cup will be supplied. Leave your suitcase in the car. After surgery it may be brought to your room. For patients admitted to the hospital, discharge time is determined by your treatment team.   Patients discharged the day of surgery will not be allowed to drive home.   Please read over the following fact sheets that you were given:   MRSA Information, CHG SOAP, ENSURE  __X__ Take these medicines the morning of surgery with A SIP  OF WATER:    1. oxyCODONE-acetaminophen (PERCOCET) 10-325 MG tablet  2. roflumilast (DALIRESP) 500 MCG TABS tablet  3.   4.  5.  6.  ____ Fleet Enema (as directed)   __X__ Use CHG Soap/SAGE wipes as directed  __X__ Use inhalers on the day of surgery  ____ Stop metformin/Janumet/Farxiga 2 days prior to surgery    ____ Take 1/2 of usual insulin dose the night before surgery. No insulin the morning          of surgery.   ____ Stop Blood Thinners Coumadin/Plavix/Xarelto/Pleta/Pradaxa/Eliquis/Effient/Aspirin  on   Or contact your Surgeon, Cardiologist or Medical Doctor regarding  ability to stop your blood thinners  __X__ Stop Anti-inflammatories 7 days before surgery such as Advil, Ibuprofen, Motrin,  BC or Goodies Powder, Naprosyn, Naproxen, Aleve, Aspirin    __X__ Stop all herbal supplements, fish oil or vitamin E until after surgery.    ____ Bring C-Pap to the hospital.

## 2021-06-19 ENCOUNTER — Telehealth: Payer: Self-pay | Admitting: *Deleted

## 2021-06-19 ENCOUNTER — Other Ambulatory Visit
Admission: RE | Admit: 2021-06-19 | Discharge: 2021-06-19 | Disposition: A | Discharge status: Home / Self Care | Payer: 59 | Source: Ambulatory Visit | Attending: Orthopedic Surgery | Admitting: Orthopedic Surgery

## 2021-06-19 ENCOUNTER — Other Ambulatory Visit: Payer: Self-pay

## 2021-06-19 ENCOUNTER — Encounter: Payer: Self-pay | Admitting: Urgent Care

## 2021-06-19 ENCOUNTER — Telehealth: Payer: Self-pay | Admitting: Urgent Care

## 2021-06-19 DIAGNOSIS — Z01818 Encounter for other preprocedural examination: Secondary | ICD-10-CM | POA: Insufficient documentation

## 2021-06-19 DIAGNOSIS — I447 Left bundle-branch block, unspecified: Secondary | ICD-10-CM | POA: Insufficient documentation

## 2021-06-19 LAB — CBC
HCT: 37.3 % (ref 36.0–46.0)
Hemoglobin: 13 g/dL (ref 12.0–15.0)
MCH: 35.2 pg — ABNORMAL HIGH (ref 26.0–34.0)
MCHC: 34.9 g/dL (ref 30.0–36.0)
MCV: 101.1 fL — ABNORMAL HIGH (ref 80.0–100.0)
Platelets: 319 10*3/uL (ref 150–400)
RBC: 3.69 MIL/uL — ABNORMAL LOW (ref 3.87–5.11)
RDW: 13 % (ref 11.5–15.5)
WBC: 11.3 10*3/uL — ABNORMAL HIGH (ref 4.0–10.5)
nRBC: 0 % (ref 0.0–0.2)

## 2021-06-19 NOTE — Telephone Encounter (Signed)
Request for pre-operative cardiac clearance Received: Today Karen Kitchens, NP  P Cv Div Preop Callback Request for pre-operative cardiac clearance:     1. What type of surgery is being performed?  RIGHT SHOULDER ARTHROSCOPIC VERSUS MINI OPEN REVISIONROTATOR CUFF REPAIR, SUBACROMIAL DECOMPRESSION, DISTAL CLAVICLE EXCISION, AND POSSIBLE BICEPS TENODESIS   2. When is this surgery scheduled?  06/22/2021     3. Are there any medications that need to be held prior to surgery?  N/A   4. Practice name and name of physician performing surgery?  Performing surgeon: Dr. Leim Fabry, MD  Requesting clearance: Honor Loh, FNP-C       5. Anesthesia type (none, local, MAC, general)? General + IS block   6. What is the office phone and fax number?    Phone: [phone]  Fax: [phone]   Note: Please review ECG performed today in PAT and comment on noted changes (ILBBB, LVH, and QTc prolongation).   ATTENTION: Unable to create telephone message as per your standard workflow. Directed by [facility] providers to send requests for cardiac clearance to this pool for appropriate distribution to provider covering pre-operative clearances.   Honor Loh, MSN, APRN, FNP-C, CEN  [facility]  Peri-operative Services Nurse Practitioner  Phone: [phone]  06/19/21 1:47 PM

## 2021-06-19 NOTE — Telephone Encounter (Signed)
Primary Cardiologist:Christopher End, MD  Chart reviewed as part of pre-operative protocol coverage. Because of Franki T Cashaw's past medical history and time since last visit, he/she will require a follow-up visit in order to better assess preoperative cardiovascular risk.  Pre-op covering staff: - Please schedule appointment and call patient to inform them. - Please contact requesting surgeon's office via preferred method (i.e, phone, fax) to inform them of need for appointment prior to surgery.  If applicable, this message will also be routed to pharmacy pool and/or primary cardiologist for input on holding anticoagulant/antiplatelet agent as requested below so that this information is available at time of patient's appointment.   Deberah Pelton, NP  06/19/2021, 2:44 PM

## 2021-06-19 NOTE — Progress Notes (Signed)
  Perioperative Services Pre-Admission/Anesthesia Testing    Date: 06/19/21  Name: Caroline Conley MRN:   [id number]  Re: ECG changes and need for preoperative cardiovascular clearance   Patient is scheduled for the above procedure on 06/22/2021 with Dr. Leim Fabry.  In preparation for her procedure, patient presented to the PAT clinic on 06/19/2021 for preoperative testing.  In review of her ECG tracing today, patient with new LBBB, LVH, and a prolonged QTc.  Patient previously followed by cardiology (End, MD).  She was last seen in the cardiology clinic in 03/2020 with plans for a 52-month RTC, however patient ultimately lost to follow-up.    Impression and Plan:  Caroline Conley presented to the PAT clinic today where she was found to have new ECG findings.  ECG was reviewed with primary cardiologist (End, MD) who confirmed new LBBB and need for preoperative evaluation and clearance appointment. Of note, this patient's surgery is scheduled for later this week.  This unfortunately means the patient's procedure will need to be postponed pending appropriate cardiovascular evaluation and subsequent clearance.  Attempted to contact patient to discuss, however there was no answer on the available numbers. LMOM asking for a return call. [facility] will also be reaching out to patient to schedule an appointment for her to see her cardiologist.  Will plan on following up as additional appointment details and/or cardiology disposition becomes available.   Communication sent to patient's primary attending surgeon and scheduler to make them aware.   Honor Loh, MSN, APRN, FNP-C, CEN [facility]  Peri-operative Services Nurse Practitioner Phone: [phone] 06/19/21 5:05 PM  NOTE: This note has been prepared using Dragon dictation software. Despite my best ability to proofread, there is always the potential that unintentional transcriptional errors may still occur from this  process.

## 2021-06-20 ENCOUNTER — Encounter: Payer: Self-pay | Admitting: Internal Medicine

## 2021-06-20 ENCOUNTER — Telehealth: Payer: Self-pay | Admitting: Urgent Care

## 2021-06-20 ENCOUNTER — Ambulatory Visit (INDEPENDENT_AMBULATORY_CARE_PROVIDER_SITE_OTHER): Payer: 59 | Admitting: Internal Medicine

## 2021-06-20 VITALS — BP 112/60 | HR 95 | Ht 66.0 in | Wt 136.0 lb

## 2021-06-20 DIAGNOSIS — R9431 Abnormal electrocardiogram [ECG] [EKG]: Secondary | ICD-10-CM

## 2021-06-20 DIAGNOSIS — R079 Chest pain, unspecified: Secondary | ICD-10-CM | POA: Diagnosis not present

## 2021-06-20 DIAGNOSIS — I447 Left bundle-branch block, unspecified: Secondary | ICD-10-CM | POA: Diagnosis not present

## 2021-06-20 DIAGNOSIS — R002 Palpitations: Secondary | ICD-10-CM

## 2021-06-20 DIAGNOSIS — Z01810 Encounter for preprocedural cardiovascular examination: Secondary | ICD-10-CM | POA: Diagnosis not present

## 2021-06-20 DIAGNOSIS — R000 Tachycardia, unspecified: Secondary | ICD-10-CM

## 2021-06-20 NOTE — Progress Notes (Signed)
Follow-up Outpatient Visit Date: 06/20/2021  Primary Care Provider: Olin Hauser, DO [place] [address]  Chief Complaint: Abnormal preop EKG  HPI:  Caroline Conley is a 55 y.o. female with history of palpitations, atypical chest pain with clean coronary arteries by LHC in 2015, COPD, prior tobacco use, and basal cell carcinoma, who presents for preop cardiac evaluation in the setting of abnormal EKG.  Caroline Conley is scheduled for right shoulder surgery with Dr. Posey Pronto in two days.  She was seen in the preop clinic yesterday, where EKG showed new incomplete LBBB.  Today, Caroline Conley reports that she has been feeling well other than chronic right shoulder pain.  She notes an episode of burning chest pain a few days ago after eating [place] food but otherwise has been without chest pain.  She also denies shortness of breath, palpitations, lightheadedness, and edema.  She does not exercise regularly but denies chest pain with her daily activities.  --------------------------------------------------------------------------------------------------  Cardiovascular History & Procedures: Cardiovascular Problems: Tachycardia and palpitations Atypical chest pain   Risk Factors: Tobacco use   Cath/PCI: LHC (2015): Report not available.  Images personally reviewed, demonstrating no significant CAD and normal LVEF.   CV Surgery: None   EP Procedures and Devices: 14-day event monitor (03/04/2019): Limited study due to significant artifact.  Usable tracings show predominantly sinus rhythm with rare PAC's and PVC's.  Two brief atrial runs noted (up to 5 beats). 48-hour Holter monitor (08/06/2018): Predominantly sinus rhythm with rare PAC's and PVC's, as well as brief atrial runs.  Average rate during the monitoring period was elevated (106 bpm).   Non-Invasive Evaluation(s): TTE (08/04/2018): Normal LV size and wall thickness.  LVEF 60-65% with normal wall motion.  Grade 1 diastolic  dysfunction.  Normal RV size and function.  No significant valvular abnormality.  Recent CV Pertinent Labs: Lab Results  Component Value Date   CHOL 169 07/29/2017   HDL 35 (L) 07/29/2017   LDLCALC 85 07/29/2017   TRIG 243 (H) 07/29/2017   CHOLHDL 4.8 07/29/2017   INR 0.9 10/09/2018   K 4.3 12/31/2018   K 3.6 09/23/2014   MG 1.8 12/31/2018   BUN 11 12/31/2018   BUN 5 (L) 09/23/2014   CREATININE 1.16 (H) 12/31/2018    Past medical and surgical history were reviewed and updated in EPIC.  Current Meds  Medication Sig   albuterol (PROVENTIL) (2.5 MG/3ML) 0.083% nebulizer solution Take 2.5 mg by nebulization every 6 (six) hours as needed.   chlorpheniramine-HYDROcodone (TUSSIONEX PENNKINETIC ER) 10-8 MG/5ML SUER Take 5 mLs by mouth every 12 (twelve) hours as needed for cough.   diltiazem (DILACOR XR) 180 MG 24 hr capsule Take 1 capsule (180 mg total) by mouth daily.   fluconazole (DIFLUCAN) 150 MG tablet Take one tablet by mouth on Day 1. Repeat dose 2nd tablet on Day 3.   Fluticasone-Umeclidin-Vilant (TRELEGY ELLIPTA) 100-62.5-25 MCG/INH AEPB Inhale 1 puff into the lungs daily.   magic mouthwash w/lidocaine SOLN Swish, gargle, and spit one to two teaspoonfuls every six hours as needed. Shake well before using.   Misc. Devices (PULSE OXIMETER FOR FINGER) MISC 1 Device by Does not apply route as needed (for cough, dyspnea). Pulse oximeter for finger for checking oxygen saturation in COPD   oxyCODONE-acetaminophen (PERCOCET) 10-325 MG tablet Take 1 tablet by mouth every 4 (four) hours.   PROAIR HFA 108 (90 Base) MCG/ACT inhaler INHALE 2 PUFFS BY MOUTH EVERY 6 HOURS ASNEEDED WHEEZING/ SHORTNESS OF  BREATH   roflumilast (DALIRESP) 500 MCG TABS tablet Take 1 tablet (500 mcg total) by mouth daily.    Allergies: 2,4-d dimethylamine (amisol); Morphine and related; and Vicodin [hydrocodone-acetaminophen]  Social History   Tobacco Use   Smoking status: Former    Packs/day: 1.00    Years:  30.00    Pack years: 30.00    Types: Cigarettes    Quit date: 03/02/2018    Years since quitting: 3.3   Smokeless tobacco: Former   Tobacco comments:    0.5 to 1 ppd >30 years  Vaping Use   Vaping Use: Never used  Substance Use Topics   Alcohol use: No   Drug use: Yes    Types: Oxycodone    Family History  Problem Relation Age of Onset   Diabetes Mother    High blood pressure Mother    Gout Mother    Cancer Father    Stroke Father    Arthritis/Rheumatoid Father    Diabetes Father    High blood pressure Father    Heart failure Father    Heart failure Brother    Diabetes Brother     Review of Systems: A 12-system review of systems was performed and was negative except as noted in the HPI.  --------------------------------------------------------------------------------------------------  Physical Exam: BP 112/60 (BP Location: Left Arm, Patient Position: Sitting, Cuff Size: Normal)   Pulse 95   Ht 5\' 6"  (1.676 m)   Wt 136 lb (61.7 kg)   SpO2 96%   BMI 21.95 kg/m   General:  NAD. Neck: No JVD or HJR. Lungs: Clear to auscultation bilaterally without wheezes or crackles. Heart: Regular rate and rhythm without murmurs, rubs, or gallops. Abdomen: Soft, nontender, nondistended. Extremities: No lower extremity edema.  EKG:  Normal sinus rhythm with LBBB (QRS ~120 ms), not significant changed from yesterday's tracing.  LBBB is new since 03/27/2020.  Lab Results  Component Value Date   WBC 11.3 (H) 06/19/2021   HGB 13.0 06/19/2021   HCT 37.3 06/19/2021   MCV 101.1 (H) 06/19/2021   PLT 319 06/19/2021    Lab Results  Component Value Date   NA 134 (L) 12/31/2018   K 4.3 12/31/2018   CL 100 12/31/2018   CO2 27 12/31/2018   BUN 11 12/31/2018   CREATININE 1.16 (H) 12/31/2018   GLUCOSE 109 (H) 12/31/2018   ALT 14 05/22/2018    Lab Results  Component Value Date   CHOL 169 07/29/2017   HDL 35 (L) 07/29/2017   LDLCALC 85 07/29/2017   TRIG 243 (H) 07/29/2017    CHOLHDL 4.8 07/29/2017    --------------------------------------------------------------------------------------------------  ASSESSMENT AND PLAN: Abnormal EKG and chest pain: Caroline Conley overall feels well, reporting only a single episode of chest pain after eating [place] food that is most suggestive of GERD/dyspepsia.  Her EKG today again shows a LBBB, new since her last visit with [place] in 03/2020.  Given plan for elective surgery, I have recommended that we obtain an echocardiogram and pharmacologic myocardial perfusion stress test to exclude new cardiomyopathy and/or ischemic heart disease underlying her LBBB.  Palpitations and sinus tachycardia: Symptoms have been quiescent since last visit in 03/2020.  We will continue low-dose diltiazem.  Preop evaluation: Caroline Conley is mostly asymptomatic other than single episode of atypical chest pain as detailed above.  While orthopedic surgery is low-risk from a cardiac standpoint, I have recommended that we proceed with an echocardiogram and pharmacologic myocardial perfusion stress test before moving forward with nonemergent surgery.  Caroline Conley in agreement with this plan.  Her shoulder surgery scheduled for Friday will need to be postponed pending these tests.  I have informed Dr. Posey Pronto and Mr. Pearline Cables about these recommendations.  If Caroline Conley' echo and stress test are low risk, I think it would be reasonable for her to proceed with shoulder surgery without additional cardiac testing and intervention.  Shared Decision Making/Informed Consent The risks [chest pain, shortness of breath, cardiac arrhythmias, dizziness, blood pressure fluctuations, myocardial infarction, stroke/transient ischemic attack, nausea, vomiting, allergic reaction, radiation exposure, metallic taste sensation and life-threatening complications (estimated to be 1 in 10,000)], benefits (risk stratification, diagnosing coronary artery disease, treatment guidance) and alternatives of a  nuclear stress test were discussed in detail with Caroline Conley and she agrees to proceed.  Follow-up: Return to clinic in 3 months with repeat EKG.  [name], MD 06/20/2021 6:57 PM

## 2021-06-20 NOTE — Progress Notes (Signed)
  Perioperative Services Pre-Admission/Anesthesia Testing    Date: 06/20/21  Name: Caroline Conley MRN:   [id number]  Re: Presurgical cardiac clearance  Patient scheduled for elective orthopedic surgery on 06/22/2021.  During preoperative testing, patient noted to have a new LBBB her ECG tracing.  Patient followed by cardiology, however this is a new finding.  Primary attending cardiologist was contacted to discuss.  MD graciously offered to see patient and expedited consult on 06/20/2021.  Following consult with cardiologist (End, MD) recommendations were for further noninvasive cardiovascular testing.  Given that her surgery is currently scheduled for 06/22/2021, cardiology asking that procedure be postponed until TTE and myocardial perfusion imaging study can be performed to rule out significant abnormalities.  Primary attending surgeon Posey Pronto, MD) was also contacted by cardiologist with the aforementioned recommendations.   Impression and Plan:  Patient with new ECG abnormality (LBBB).  Patient has been seen by her cardiologist and recommendations are for further noninvasive cardiovascular work-up.  Given the potential applications of the acute findings, this is felt to be in the best interest of the patient.  Surgery being postponed from the original date pending the aforementioned testing and cardiovascular clearance.  Dr. Posey Pronto (Surgeon) is aware.  Communication also sent to surgery scheduler.  We will follow-up on the status of her testing and clearance and proceed as directed by cardiology.  Honor Loh, MSN, APRN, FNP-C, CEN [facility]  Peri-operative Services Nurse Practitioner Phone: [phone] 06/20/21 4:28 PM  NOTE: This note has been prepared using Dragon dictation software. Despite my best ability to proofread, there is always the potential that unintentional transcriptional errors may still occur from this process

## 2021-06-20 NOTE — Telephone Encounter (Signed)
1st attempt to reach pt regarding surgical clearance and the need for an appt before clearance can be given.  Left pt a message that she would need to be seen 1st and to call the [facility] office for an appointment.

## 2021-06-20 NOTE — H&P (View-Only) (Signed)
Follow-up Outpatient Visit Date: 06/20/2021  Primary Care Provider: Olin Hauser, DO [place] [address]  Chief Complaint: Abnormal preop EKG  HPI:  [name] is a 55 y.o. female with history of palpitations, atypical chest pain with clean coronary arteries by LHC in 2015, COPD, prior tobacco use, and basal cell carcinoma, who presents for preop cardiac evaluation in the setting of abnormal EKG.  [name] is scheduled for right shoulder surgery with Dr. Posey Pronto in two days.  She was seen in the preop clinic yesterday, where EKG showed new incomplete LBBB.  Today, [name] reports that she has been feeling well other than chronic right shoulder pain.  She notes an episode of burning chest pain a few days ago after eating [place] food but otherwise has been without chest pain.  She also denies shortness of breath, palpitations, lightheadedness, and edema.  She does not exercise regularly but denies chest pain with her daily activities.  --------------------------------------------------------------------------------------------------  Cardiovascular History & Procedures: Cardiovascular Problems: Tachycardia and palpitations Atypical chest pain   Risk Factors: Tobacco use   Cath/PCI: LHC (2015): Report not available.  Images personally reviewed, demonstrating no significant CAD and normal LVEF.   CV Surgery: None   EP Procedures and Devices: 14-day event monitor (03/04/2019): Limited study due to significant artifact.  Usable tracings show predominantly sinus rhythm with rare PAC's and PVC's.  Two brief atrial runs noted (up to 5 beats). 48-hour Holter monitor (08/06/2018): Predominantly sinus rhythm with rare PAC's and PVC's, as well as brief atrial runs.  Average rate during the monitoring period was elevated (106 bpm).   Non-Invasive Evaluation(s): TTE (08/04/2018): Normal LV size and wall thickness.  LVEF 60-65% with normal wall motion.  Grade 1 diastolic  dysfunction.  Normal RV size and function.  No significant valvular abnormality.  Recent CV Pertinent Labs: Lab Results  Component Value Date   CHOL 169 07/29/2017   HDL 35 (L) 07/29/2017   LDLCALC 85 07/29/2017   TRIG 243 (H) 07/29/2017   CHOLHDL 4.8 07/29/2017   INR 0.9 10/09/2018   K 4.3 12/31/2018   K 3.6 09/23/2014   MG 1.8 12/31/2018   BUN 11 12/31/2018   BUN 5 (L) 09/23/2014   CREATININE 1.16 (H) 12/31/2018    Past medical and surgical history were reviewed and updated in EPIC.  Current Meds  Medication Sig   albuterol (PROVENTIL) (2.5 MG/3ML) 0.083% nebulizer solution Take 2.5 mg by nebulization every 6 (six) hours as needed.   chlorpheniramine-HYDROcodone (TUSSIONEX PENNKINETIC ER) 10-8 MG/5ML SUER Take 5 mLs by mouth every 12 (twelve) hours as needed for cough.   diltiazem (DILACOR XR) 180 MG 24 hr capsule Take 1 capsule (180 mg total) by mouth daily.   fluconazole (DIFLUCAN) 150 MG tablet Take one tablet by mouth on Day 1. Repeat dose 2nd tablet on Day 3.   Fluticasone-Umeclidin-Vilant (TRELEGY ELLIPTA) 100-62.5-25 MCG/INH AEPB Inhale 1 puff into the lungs daily.   magic mouthwash w/lidocaine SOLN Swish, gargle, and spit one to two teaspoonfuls every six hours as needed. Shake well before using.   Misc. Devices (PULSE OXIMETER FOR FINGER) MISC 1 Device by Does not apply route as needed (for cough, dyspnea). Pulse oximeter for finger for checking oxygen saturation in COPD   oxyCODONE-acetaminophen (PERCOCET) 10-325 MG tablet Take 1 tablet by mouth every 4 (four) hours.   PROAIR HFA 108 (90 Base) MCG/ACT inhaler INHALE 2 PUFFS BY MOUTH EVERY 6 HOURS ASNEEDED WHEEZING/ SHORTNESS OF  BREATH   roflumilast (DALIRESP) 500 MCG TABS tablet Take 1 tablet (500 mcg total) by mouth daily.    Allergies: 2,4-d dimethylamine (amisol); Morphine and related; and Vicodin [hydrocodone-acetaminophen]  Social History   Tobacco Use   Smoking status: Former    Packs/day: 1.00    Years:  30.00    Pack years: 30.00    Types: Cigarettes    Quit date: 03/02/2018    Years since quitting: 3.3   Smokeless tobacco: Former   Tobacco comments:    0.5 to 1 ppd >30 years  Vaping Use   Vaping Use: Never used  Substance Use Topics   Alcohol use: No   Drug use: Yes    Types: Oxycodone    Family History  Problem Relation Age of Onset   Diabetes Mother    High blood pressure Mother    Gout Mother    Cancer Father    Stroke Father    Arthritis/Rheumatoid Father    Diabetes Father    High blood pressure Father    Heart failure Father    Heart failure Brother    Diabetes Brother     Review of Systems: A 12-system review of systems was performed and was negative except as noted in the HPI.  --------------------------------------------------------------------------------------------------  Physical Exam: BP 112/60 (BP Location: Left Arm, Patient Position: Sitting, Cuff Size: Normal)   Pulse 95   Ht 5\' 6"  (1.676 m)   Wt 136 lb (61.7 kg)   SpO2 96%   BMI 21.95 kg/m   General:  NAD. Neck: No JVD or HJR. Lungs: Clear to auscultation bilaterally without wheezes or crackles. Heart: Regular rate and rhythm without murmurs, rubs, or gallops. Abdomen: Soft, nontender, nondistended. Extremities: No lower extremity edema.  EKG:  Normal sinus rhythm with LBBB (QRS ~120 ms), not significant changed from yesterday's tracing.  LBBB is new since 03/27/2020.  Lab Results  Component Value Date   WBC 11.3 (H) 06/19/2021   HGB 13.0 06/19/2021   HCT 37.3 06/19/2021   MCV 101.1 (H) 06/19/2021   PLT 319 06/19/2021    Lab Results  Component Value Date   NA 134 (L) 12/31/2018   K 4.3 12/31/2018   CL 100 12/31/2018   CO2 27 12/31/2018   BUN 11 12/31/2018   CREATININE 1.16 (H) 12/31/2018   GLUCOSE 109 (H) 12/31/2018   ALT 14 05/22/2018    Lab Results  Component Value Date   CHOL 169 07/29/2017   HDL 35 (L) 07/29/2017   LDLCALC 85 07/29/2017   TRIG 243 (H) 07/29/2017    CHOLHDL 4.8 07/29/2017    --------------------------------------------------------------------------------------------------  ASSESSMENT AND PLAN: Abnormal EKG and chest pain: [name] overall feels well, reporting only a single episode of chest pain after eating [place] food that is most suggestive of GERD/dyspepsia.  Her EKG today again shows a LBBB, new since her last visit with [place] in 03/2020.  Given plan for elective surgery, I have recommended that we obtain an echocardiogram and pharmacologic myocardial perfusion stress test to exclude new cardiomyopathy and/or ischemic heart disease underlying her LBBB.  Palpitations and sinus tachycardia: Symptoms have been quiescent since last visit in 03/2020.  We will continue low-dose diltiazem.  Preop evaluation: [name] is mostly asymptomatic other than single episode of atypical chest pain as detailed above.  While orthopedic surgery is low-risk from a cardiac standpoint, I have recommended that we proceed with an echocardiogram and pharmacologic myocardial perfusion stress test before moving forward with nonemergent surgery.  [name] in agreement with this plan.  Her shoulder surgery scheduled for Friday will need to be postponed pending these tests.  I have informed Dr. Posey Pronto and Mr. Pearline Cables about these recommendations.  If Ms. Nase' echo and stress test are low risk, I think it would be reasonable for her to proceed with shoulder surgery without additional cardiac testing and intervention.  Shared Decision Making/Informed Consent The risks [chest pain, shortness of breath, cardiac arrhythmias, dizziness, blood pressure fluctuations, myocardial infarction, stroke/transient ischemic attack, nausea, vomiting, allergic reaction, radiation exposure, metallic taste sensation and life-threatening complications (estimated to be 1 in 10,000)], benefits (risk stratification, diagnosing coronary artery disease, treatment guidance) and alternatives of a  nuclear stress test were discussed in detail with [name] and she agrees to proceed.  Follow-up: Return to clinic in 3 months with repeat EKG.  [name], MD 06/20/2021 6:57 PM

## 2021-06-20 NOTE — Telephone Encounter (Signed)
Pt is scheduled to see Dr. Saunders Revel today for preop clearance.  Will route back to the requesting surgeon's office to make them aware.

## 2021-06-20 NOTE — Patient Instructions (Signed)
Medication Instructions:  Your physician recommends that you continue on your current medications as directed. Please refer to the Current Medication list given to you today.  *If you need a refill on your cardiac medications before your next appointment, please call your pharmacy*   Lab Work: None ordered If you have labs (blood work) drawn today and your tests are completely normal, you will receive your results only by: [place] (if you have MyChart) OR A paper copy in the mail If you have any lab test that is abnormal or we need to change your treatment, we will call you to review the results.   Testing/Procedures: Your physician has requested that you have an echocardiogram. Echocardiography is a painless test that uses sound waves to create images of your heart. It provides your doctor with information about the size and shape of your heart and how well your heart's chambers and valves are working. This procedure takes approximately one hour. There are no restrictions for this procedure.  Your physician has requested that you have a lexiscan myoview. For further information please visit [email or web address]. Please follow instruction sheet, as given.  (Test to be scheduled with in 2 weeks)   Follow-Up: At [facility], you and your health needs are our priority.  As part of our continuing mission to provide you with exceptional heart care, we have created designated Provider Care Teams.  These Care Teams include your primary Cardiologist (physician) and Advanced Practice Providers (APPs -  Physician Assistants and Nurse Practitioners) who all work together to provide you with the care you need, when you need it.  We recommend signing up for the patient portal called "MyChart".  Sign up information is provided on this After Visit Summary.  MyChart is used to connect with patients for Virtual Visits (Telemedicine).  Patients are able to view lab/test results, encounter notes,  upcoming appointments, etc.  Non-urgent messages can be sent to your provider as well.   To learn more about what you can do with MyChart, go to [email or web address].    Your next appointment:   3 month(s)  The format for your next appointment:   In Person  Provider:   You may see [name], MD or one of the following Advanced Practice Providers on your designated Care Team:   Murray Hodgkins, NP Christell Faith, PA-C Marrianne Mood, PA-C Cadence [place], [place] Laurann Montana, NP   Other Instructions [place]  Your caregiver has ordered a Stress Test with nuclear imaging. The purpose of this test is to evaluate the blood supply to your heart muscle. This procedure is referred to as a "Non-Invasive Stress Test." This is because other than having an IV started in your vein, nothing is inserted or "invades" your body. Cardiac stress tests are done to find areas of poor blood flow to the heart by determining the extent of coronary artery disease (CAD). Some patients exercise on a treadmill, which naturally increases the blood flow to your heart, while others who are  unable to walk on a treadmill due to physical limitations have a pharmacologic/chemical stress agent called Lexiscan . This medicine will mimic walking on a treadmill by temporarily increasing your coronary blood flow.   Please note: these test may take anywhere between 2-4 hours to complete  PLEASE REPORT TO [place] AT THE FIRST DESK WILL DIRECT YOU WHERE TO GO  Date of Procedure:_____________________________________  Arrival Time for Procedure:______________________________  Instructions regarding medication:  PLEASE NOTIFY THE OFFICE AT LEAST 91 HOURS IN ADVANCE IF YOU ARE UNABLE TO KEEP YOUR APPOINTMENT.  [phone] AND  PLEASE NOTIFY NUCLEAR MEDICINE AT [facility] AT LEAST 24 HOURS IN ADVANCE IF YOU ARE UNABLE TO KEEP YOUR APPOINTMENT. [phone]  How to prepare for your  Myoview test:  Do not eat or drink after midnight No caffeine for 24 hours prior to test No smoking 24 hours prior to test. Your medication may be taken with water.  If your doctor stopped a medication because of this test, do not take that medication. Ladies, please do not wear dresses.  Skirts or pants are appropriate. Please wear a short sleeve shirt. No perfume or lotion. Wear comfortable walking shoes. No heels!

## 2021-06-21 ENCOUNTER — Telehealth: Payer: Self-pay | Admitting: Internal Medicine

## 2021-06-21 NOTE — Telephone Encounter (Signed)
   Patient Name: Caroline Conley  DOB: [birth year]  MRN: [id number]   Primary Cardiologist: [name], MD  Chart reviewed as part of pre-operative protocol coverage. This appears to be a duplicate clearance already addressed. Dr. Saunders Revel saw the patient in clinic yesterday and recommended additional cardiac testing prior to clearance. Honor Loh, periop services NP, was able to review this recommendation and notified the surgeon's office. The surgery is listed in the computer as cancelled. No additional input needed from preop APP from this time. Per usual office protocol, the provider should assess clearance at time of finalized studies and should forward their finalized clearance decision to requesting party. I will remove this message from the pre-op box.  Charlie Pitter, PA-C 06/21/2021, 3:07 PM

## 2021-06-21 NOTE — Telephone Encounter (Signed)
   [facility] Pre-operative Risk Assessment    Patient Name: Caroline Conley  DOB: [birth year] MRN: [id number]  HEARTCARE STAFF:  - IMPORTANT!!!!!! Under Visit Info/Reason for Call, type in Other and utilize the format Clearance MM/DD/YY or Clearance TBD. Do not use dashes or single digits. - Please review there is not already an duplicate clearance open for this procedure. - If request is for dental extraction, please clarify the # of teeth to be extracted. - If the patient is currently at the dentist's office, call Pre-Op Callback Staff (MA/nurse) to input urgent request.  - If the patient is not currently in the dentist office, please route to the Pre-Op pool.  Request for surgical clearance:  What type of surgery is being performed? R Shoulder Arthroscopic vs mini open revision rotator cuff repair, subacromial decompression, distal clavicle excision & possible biceps tenodesis   When is this surgery scheduled? 06/22/21 and 06/25/21  What type of clearance is required (medical clearance vs. Pharmacy clearance to hold med vs. Both)? both  Are there any medications that need to be held prior to surgery and how long?not listed, please advise if needed   Practice name and name of physician performing surgery? [facility] - Dr Leim Fabry   What is the office phone number? [phone]   7.   What is the office fax number? [phone]  8.   Anesthesia type (None, local, MAC, general) ? Not listed    Ace Gins 06/21/2021, 2:45 PM  _________________________________________________________________   (provider comments below)

## 2021-06-22 ENCOUNTER — Ambulatory Visit: Admission: RE | Admit: 2021-06-22 | Payer: 59 | Source: Home / Self Care | Admitting: Orthopedic Surgery

## 2021-06-22 ENCOUNTER — Other Ambulatory Visit: Payer: Self-pay

## 2021-06-22 ENCOUNTER — Ambulatory Visit (INDEPENDENT_AMBULATORY_CARE_PROVIDER_SITE_OTHER): Payer: 59

## 2021-06-22 DIAGNOSIS — R9431 Abnormal electrocardiogram [ECG] [EKG]: Secondary | ICD-10-CM | POA: Diagnosis not present

## 2021-06-22 DIAGNOSIS — Z01810 Encounter for preprocedural cardiovascular examination: Secondary | ICD-10-CM

## 2021-06-22 DIAGNOSIS — R079 Chest pain, unspecified: Secondary | ICD-10-CM

## 2021-06-22 SURGERY — SHOULDER ARTHROSCOPY WITH SUBACROMIAL DECOMPRESSION AND OPEN ROTATOR CUFF REPAIR, OPEN BICEPS TENDON REPAIR
Anesthesia: Choice | Laterality: Right

## 2021-06-25 ENCOUNTER — Encounter
Admission: RE | Admit: 2021-06-25 | Discharge: 2021-06-25 | Disposition: A | Discharge status: Home / Self Care | Payer: 59 | Source: Ambulatory Visit | Attending: Internal Medicine | Admitting: Internal Medicine

## 2021-06-25 ENCOUNTER — Other Ambulatory Visit: Payer: Self-pay

## 2021-06-25 DIAGNOSIS — R079 Chest pain, unspecified: Secondary | ICD-10-CM | POA: Diagnosis present

## 2021-06-25 DIAGNOSIS — Z01810 Encounter for preprocedural cardiovascular examination: Secondary | ICD-10-CM

## 2021-06-25 DIAGNOSIS — R9431 Abnormal electrocardiogram [ECG] [EKG]: Secondary | ICD-10-CM | POA: Insufficient documentation

## 2021-06-25 LAB — NM MYOCAR MULTI W/SPECT W/WALL MOTION / EF
LV dias vol: 93 mL (ref 46–106)
LV sys vol: 42 mL
MPHR: 165 {beats}/min
Peak HR: 125 {beats}/min
Percent HR: 75 %
Rest HR: 76 {beats}/min
SDS: 6
SRS: 2
SSS: 1
TID: 1.09

## 2021-06-25 LAB — ECHOCARDIOGRAM COMPLETE
AR max vel: 2.35 cm2
AV Area VTI: 2.44 cm2
AV Area mean vel: 2.16 cm2
AV Mean grad: 3 mmHg
AV Peak grad: 5.9 mmHg
Ao pk vel: 1.21 m/s
Area-P 1/2: 3.56 cm2
Calc EF: 43.8 %
S' Lateral: 2.75 cm
Single Plane A2C EF: 45.8 %
Single Plane A4C EF: 41.7 %

## 2021-06-25 MED ORDER — REGADENOSON 0.4 MG/5ML IV SOLN
0.4000 mg | Freq: Once | INTRAVENOUS | Status: AC
  Administered 2021-06-25: 0.4 mg via INTRAVENOUS
  Filled 2021-06-25: qty 5

## 2021-06-25 MED ORDER — TECHNETIUM TC 99M TETROFOSMIN IV KIT
10.0000 | PACK | Freq: Once | INTRAVENOUS | Status: AC | PRN
  Administered 2021-06-25: 10.02 via INTRAVENOUS

## 2021-06-25 MED ORDER — TECHNETIUM TC 99M TETROFOSMIN IV KIT
31.2200 | PACK | Freq: Once | INTRAVENOUS | Status: AC | PRN
  Administered 2021-06-25: 31.22 via INTRAVENOUS

## 2021-06-26 ENCOUNTER — Telehealth: Payer: Self-pay | Admitting: Internal Medicine

## 2021-06-26 DIAGNOSIS — Z01812 Encounter for preprocedural laboratory examination: Secondary | ICD-10-CM

## 2021-06-26 MED ORDER — ASPIRIN EC 81 MG PO TBEC: 81.0000 mg | DELAYED_RELEASE_TABLET | Freq: Every day | ORAL | 11 refills | Status: DC

## 2021-06-26 NOTE — Addendum Note (Signed)
Addended by: Darlyne Russian on: 06/26/2021 05:16 PM   Modules accepted: Orders

## 2021-06-26 NOTE — Telephone Encounter (Signed)
Left heart cath procedure scheduled.  Spoke to pt and reviewed instructions below.  BMET and CBC lab orders placed.  Pt confirms she will [facility] these [facility] mall.  Pt voiced understanding.  Instructions also posted to Mychart for pt to view.   You are scheduled for a Cardiac Catheterization on Monday, July 18 with Dr. Harrell Gave End.  1. Please arrive at the [facility] (Main Entrance A) at [facility]: [place], [place] [address] at 7:00 AM (This time is two hours before your procedure to ensure your preparation). Free valet parking service is available.   Special note: Every effort is made to have your procedure done on time. Please understand that emergencies sometimes delay scheduled procedures.  2. Diet: Do not eat solid foods after midnight.  The patient may have clear liquids until 5am upon the day of the procedure.  3. Labs: You will need to have blood drawn at the [facility] tomorrow 06/27/21. You do not need to be fasting. (BMET / CBC)  -  Please go to the [facility]. You will check in at the front desk to the right as you walk into the atrium. Valet Parking is offered if needed. - No appointment needed. You may go any day between 7 am and 6 pm.   4. Medication instructions in preparation for your procedure:   Contrast Allergy: No   On the morning of your procedure, take your Aspirin and any morning medicines NOT listed above.  You may use sips of water.  5. Plan for one night stay--bring personal belongings. 6. Bring a current list of your medications and current insurance cards. 7. You MUST have a responsible person to drive you home. 8. Someone MUST be with you the first 24 hours after you arrive home or your discharge will be delayed. 9. Please wear clothes that are easy to get on and off and wear slip-on shoes.  Thank you for allowing [place] to care for you!   -- [facility]

## 2021-06-26 NOTE — Telephone Encounter (Signed)
Echo and stress test results d/w Caroline Conley by phone, including LVEF 40% and concern for LAD ischemia.  We will plan for LHC with possible PCI at [facility] next Monday (07/02/2021).  I will have [name] start ASA 81 mg daily.  I advised her to contact us or seek immediate medical attention if concerning symptoms develop.  Shared Decision Making/Informed Consent The risks [stroke (1 in 1000), death (1 in 1000), kidney failure [usually temporary] (1 in 500), bleeding (1 in 200), allergic reaction [possibly serious] (1 in 200)], benefits (diagnostic support and management of coronary artery disease) and alternatives of a cardiac catheterization were discussed in detail with [name] and she is willing to proceed.  [name], MD [facility]

## 2021-06-27 ENCOUNTER — Other Ambulatory Visit
Admission: RE | Admit: 2021-06-27 | Discharge: 2021-06-27 | Disposition: A | Discharge status: Home / Self Care | Payer: 59 | Source: Ambulatory Visit | Attending: Internal Medicine | Admitting: Internal Medicine

## 2021-06-27 DIAGNOSIS — Z01812 Encounter for preprocedural laboratory examination: Secondary | ICD-10-CM

## 2021-06-27 LAB — BASIC METABOLIC PANEL
Anion gap: 10 (ref 5–15)
BUN: 8 mg/dL (ref 6–20)
CO2: 27 mmol/L (ref 22–32)
Calcium: 9.6 mg/dL (ref 8.9–10.3)
Chloride: 102 mmol/L (ref 98–111)
Creatinine, Ser: 1.21 mg/dL — ABNORMAL HIGH (ref 0.44–1.00)
GFR, Estimated: 53 mL/min — ABNORMAL LOW (ref >60)
Glucose, Bld: 104 mg/dL — ABNORMAL HIGH (ref 70–99)
Potassium: 3.8 mmol/L (ref 3.5–5.1)
Sodium: 139 mmol/L (ref 135–145)

## 2021-06-27 LAB — CBC
HCT: 40.2 % (ref 36.0–46.0)
Hemoglobin: 14 g/dL (ref 12.0–15.0)
MCH: 34.1 pg — ABNORMAL HIGH (ref 26.0–34.0)
MCHC: 34.8 g/dL (ref 30.0–36.0)
MCV: 98 fL (ref 80.0–100.0)
Platelets: 317 10*3/uL (ref 150–400)
RBC: 4.1 MIL/uL (ref 3.87–5.11)
RDW: 12.7 % (ref 11.5–15.5)
WBC: 8.9 10*3/uL (ref 4.0–10.5)
nRBC: 0 % (ref 0.0–0.2)

## 2021-06-28 ENCOUNTER — Other Ambulatory Visit: Payer: Self-pay | Admitting: Internal Medicine

## 2021-06-28 ENCOUNTER — Telehealth: Payer: Self-pay | Admitting: *Deleted

## 2021-06-28 DIAGNOSIS — R9439 Abnormal result of other cardiovascular function study: Secondary | ICD-10-CM

## 2021-06-28 DIAGNOSIS — I429 Cardiomyopathy, unspecified: Secondary | ICD-10-CM

## 2021-06-28 NOTE — Telephone Encounter (Signed)
Pt contacted pre-catheterization scheduled at [facility] for: Monday July 02, 2021 9 AM Verified arrival time and place: [facility]) at: 7 AM   No solid food after midnight prior to cath, clear liquids until 5 AM day of procedure.   AM meds can be  taken pre-cath with sips of water including: aspirin 81 mg   Confirmed patient has responsible adult to drive home post procedure and be with patient first 24 hours after arriving home: yes  You are allowed ONE visitor in the waiting room during the time you are at the hospital for your procedure. Both you and your visitor must wear a mask once you enter the hospital.   Patient reports does not currently have any symptoms concerning for COVID-19 and no household members with COVID-19 like illness.       Reviewed procedure/mask/visitor instructions with patient.

## 2021-06-29 ENCOUNTER — Telehealth: Payer: Self-pay | Admitting: *Deleted

## 2021-06-29 NOTE — Telephone Encounter (Signed)
-----   Message from [name], MD sent at 06/28/2021  8:38 PM EDT ----- Previously noted leukocytosis has resolved.  Labs otherwise stable.  Ok to proceed with catheterization as planned.

## 2021-06-29 NOTE — Telephone Encounter (Signed)
Left voicemail message to call back for review of results.  

## 2021-07-02 ENCOUNTER — Observation Stay (HOSPITAL_COMMUNITY)
Admission: RE | Admit: 2021-07-02 | Discharge: 2021-07-02 | Disposition: A | Discharge status: Home / Self Care | Payer: 59 | Attending: Internal Medicine | Admitting: Internal Medicine

## 2021-07-02 ENCOUNTER — Observation Stay (HOSPITAL_BASED_OUTPATIENT_CLINIC_OR_DEPARTMENT_OTHER): Payer: 59

## 2021-07-02 ENCOUNTER — Other Ambulatory Visit: Payer: Self-pay

## 2021-07-02 ENCOUNTER — Encounter (HOSPITAL_COMMUNITY)
Admission: RE | Disposition: A | Discharge status: Home / Self Care | Payer: Self-pay | Source: Home / Self Care | Attending: Internal Medicine

## 2021-07-02 DIAGNOSIS — Z79899 Other long term (current) drug therapy: Secondary | ICD-10-CM | POA: Diagnosis not present

## 2021-07-02 DIAGNOSIS — R9439 Abnormal result of other cardiovascular function study: Secondary | ICD-10-CM | POA: Diagnosis not present

## 2021-07-02 DIAGNOSIS — Z87891 Personal history of nicotine dependence: Secondary | ICD-10-CM | POA: Diagnosis not present

## 2021-07-02 DIAGNOSIS — I4901 Ventricular fibrillation: Secondary | ICD-10-CM | POA: Diagnosis not present

## 2021-07-02 DIAGNOSIS — I429 Cardiomyopathy, unspecified: Secondary | ICD-10-CM | POA: Diagnosis not present

## 2021-07-02 DIAGNOSIS — Z20822 Contact with and (suspected) exposure to covid-19: Secondary | ICD-10-CM | POA: Diagnosis not present

## 2021-07-02 DIAGNOSIS — R079 Chest pain, unspecified: Secondary | ICD-10-CM | POA: Diagnosis present

## 2021-07-02 DIAGNOSIS — I25119 Atherosclerotic heart disease of native coronary artery with unspecified angina pectoris: Secondary | ICD-10-CM | POA: Diagnosis not present

## 2021-07-02 DIAGNOSIS — J449 Chronic obstructive pulmonary disease, unspecified: Secondary | ICD-10-CM | POA: Insufficient documentation

## 2021-07-02 DIAGNOSIS — R0789 Other chest pain: Secondary | ICD-10-CM | POA: Diagnosis present

## 2021-07-02 DIAGNOSIS — I428 Other cardiomyopathies: Secondary | ICD-10-CM

## 2021-07-02 HISTORY — PX: LEFT HEART CATH AND CORONARY ANGIOGRAPHY: CATH118249

## 2021-07-02 HISTORY — DX: Ventricular fibrillation: I49.01

## 2021-07-02 LAB — SARS CORONAVIRUS 2 (TAT 6-24 HRS): SARS Coronavirus 2: NEGATIVE

## 2021-07-02 LAB — ECHOCARDIOGRAM LIMITED
Calc EF: 30 %
Height: 66 in
S' Lateral: 3.9 cm
Single Plane A2C EF: 33.3 %
Single Plane A4C EF: 32.3 %
Weight: 2192 oz

## 2021-07-02 SURGERY — LEFT HEART CATH AND CORONARY ANGIOGRAPHY
Anesthesia: LOCAL

## 2021-07-02 MED ORDER — NITROGLYCERIN 1 MG/10 ML FOR IR/CATH LAB
INTRA_ARTERIAL | Status: DC | PRN
  Administered 2021-07-02: 150 ug via INTRACORONARY

## 2021-07-02 MED ORDER — FENTANYL CITRATE (PF) 100 MCG/2ML IJ SOLN
INTRAMUSCULAR | Status: DC | PRN
  Administered 2021-07-02 (×2): 25 ug via INTRAVENOUS

## 2021-07-02 MED ORDER — LIDOCAINE HCL (PF) 1 % IJ SOLN
INTRAMUSCULAR | Status: AC
  Filled 2021-07-02: qty 30

## 2021-07-02 MED ORDER — BISOPROLOL FUMARATE 5 MG PO TABS: 2.5000 mg | ORAL_TABLET | Freq: Every day | ORAL | 6 refills | Status: DC

## 2021-07-02 MED ORDER — OXYCODONE-ACETAMINOPHEN 5-325 MG PO TABS
ORAL_TABLET | ORAL | Status: AC
  Filled 2021-07-02: qty 2

## 2021-07-02 MED ORDER — SODIUM CHLORIDE 0.9 % WEIGHT BASED INFUSION: 1.0000 mL/kg/h | INTRAVENOUS | Status: DC

## 2021-07-02 MED ORDER — VERAPAMIL HCL 2.5 MG/ML IV SOLN
INTRAVENOUS | Status: AC
  Filled 2021-07-02: qty 2

## 2021-07-02 MED ORDER — HEPARIN (PORCINE) IN NACL 1000-0.9 UT/500ML-% IV SOLN
INTRAVENOUS | Status: DC | PRN
  Administered 2021-07-02 (×2): 500 mL

## 2021-07-02 MED ORDER — VERAPAMIL HCL 2.5 MG/ML IV SOLN
INTRAVENOUS | Status: DC | PRN
  Administered 2021-07-02 (×2): 10 mL via INTRA_ARTERIAL

## 2021-07-02 MED ORDER — ASPIRIN 81 MG PO CHEW
81.0000 mg | CHEWABLE_TABLET | ORAL | Status: AC
Start: 2021-07-02 — End: 2021-07-02
  Administered 2021-07-02: 81 mg via ORAL
  Filled 2021-07-02: qty 1

## 2021-07-02 MED ORDER — CEFAZOLIN SODIUM-DEXTROSE 2-4 GM/100ML-% IV SOLN
2.0000 g | Freq: Once | INTRAVENOUS | Status: AC
  Administered 2021-07-02: 2 g via INTRAVENOUS

## 2021-07-02 MED ORDER — IOHEXOL 350 MG/ML SOLN
INTRAVENOUS | Status: DC | PRN
  Administered 2021-07-02: 65 mL

## 2021-07-02 MED ORDER — SODIUM CHLORIDE 0.9 % IV SOLN: 250.0000 mL | INTRAVENOUS | Status: DC | PRN

## 2021-07-02 MED ORDER — HEPARIN (PORCINE) IN NACL 1000-0.9 UT/500ML-% IV SOLN
INTRAVENOUS | Status: AC
  Filled 2021-07-02: qty 500

## 2021-07-02 MED ORDER — BISOPROLOL FUMARATE 5 MG PO TABS
2.5000 mg | ORAL_TABLET | Freq: Every day | ORAL | Status: DC
  Filled 2021-07-02: qty 0.5

## 2021-07-02 MED ORDER — LIDOCAINE HCL (PF) 1 % IJ SOLN
INTRAMUSCULAR | Status: DC | PRN
  Administered 2021-07-02: 2 mL

## 2021-07-02 MED ORDER — SODIUM CHLORIDE 0.9 % IV SOLN: INTRAVENOUS | Status: AC

## 2021-07-02 MED ORDER — SODIUM CHLORIDE 0.9% FLUSH: 3.0000 mL | INTRAVENOUS | Status: DC | PRN

## 2021-07-02 MED ORDER — MIDAZOLAM HCL 2 MG/2ML IJ SOLN
INTRAMUSCULAR | Status: AC
  Filled 2021-07-02: qty 2

## 2021-07-02 MED ORDER — SODIUM CHLORIDE 0.9% FLUSH: 3.0000 mL | Freq: Two times a day (BID) | INTRAVENOUS | Status: DC

## 2021-07-02 MED ORDER — ROFLUMILAST 500 MCG PO TABS
500.0000 ug | ORAL_TABLET | Freq: Every day | ORAL | Status: DC
  Administered 2021-07-02: 500 ug via ORAL
  Filled 2021-07-02: qty 1

## 2021-07-02 MED ORDER — NITROGLYCERIN 1 MG/10 ML FOR IR/CATH LAB
INTRA_ARTERIAL | Status: AC
  Filled 2021-07-02: qty 10

## 2021-07-02 MED ORDER — SODIUM CHLORIDE 0.9 % WEIGHT BASED INFUSION: 3.0000 mL/kg/h | INTRAVENOUS | Status: AC

## 2021-07-02 MED ORDER — HEPARIN SODIUM (PORCINE) 1000 UNIT/ML IJ SOLN
INTRAMUSCULAR | Status: DC | PRN
  Administered 2021-07-02: 3000 [IU] via INTRAVENOUS

## 2021-07-02 MED ORDER — HEPARIN SODIUM (PORCINE) 1000 UNIT/ML IJ SOLN
INTRAMUSCULAR | Status: AC
  Filled 2021-07-02: qty 1

## 2021-07-02 MED ORDER — FENTANYL CITRATE (PF) 100 MCG/2ML IJ SOLN
INTRAMUSCULAR | Status: AC
  Filled 2021-07-02: qty 2

## 2021-07-02 MED ORDER — HEPARIN (PORCINE) IN NACL 1000-0.9 UT/500ML-% IV SOLN
INTRAVENOUS | Status: AC
  Filled 2021-07-02: qty 1000

## 2021-07-02 MED ORDER — FLUTICASONE-UMECLIDIN-VILANT 100-62.5-25 MCG/INH IN AEPB: 1.0000 | INHALATION_SPRAY | Freq: Every day | RESPIRATORY_TRACT | Status: DC

## 2021-07-02 MED ORDER — MIDAZOLAM HCL 2 MG/2ML IJ SOLN
INTRAMUSCULAR | Status: DC | PRN
  Administered 2021-07-02 (×2): 1 mg via INTRAVENOUS

## 2021-07-02 MED ORDER — OXYCODONE-ACETAMINOPHEN 5-325 MG PO TABS
2.0000 | ORAL_TABLET | Freq: Four times a day (QID) | ORAL | Status: DC | PRN
  Administered 2021-07-02: 2 via ORAL

## 2021-07-02 MED ORDER — CEFAZOLIN SODIUM-DEXTROSE 2-4 GM/100ML-% IV SOLN
INTRAVENOUS | Status: AC
  Filled 2021-07-02: qty 100

## 2021-07-02 MED ORDER — OXYCODONE-ACETAMINOPHEN 10-325 MG PO TABS: 1.0000 | ORAL_TABLET | ORAL | Status: DC | PRN

## 2021-07-02 SURGICAL SUPPLY — 13 items
CATH 5FR JL3.5 JR4 ANG PIG MP (CATHETERS) ×1 IMPLANT
CATH INFINITI JR4 5F (CATHETERS) ×1 IMPLANT
DEVICE RAD TR BAND REGULAR (VASCULAR PRODUCTS) ×1 IMPLANT
GLIDESHEATH SLEND SS 6F .021 (SHEATH) ×2 IMPLANT
GUIDEWIRE INQWIRE 1.5J.035X260 (WIRE) IMPLANT
INQWIRE 1.5J .035X260CM (WIRE) ×6
KIT HEART LEFT (KITS) ×3 IMPLANT
PACK CARDIAC CATHETERIZATION (CUSTOM PROCEDURE TRAY) ×2 IMPLANT
SHEATH PINNACLE 6F 10CM (SHEATH) ×1 IMPLANT
SHEATH PROBE COVER 6X72 (BAG) ×1 IMPLANT
TRANSDUCER W/STOPCOCK (MISCELLANEOUS) ×2 IMPLANT
TUBING CIL FLEX 10 FLL-RA (TUBING) ×3 IMPLANT
WIRE HI TORQ VERSACORE-J 145CM (WIRE) ×2 IMPLANT

## 2021-07-02 NOTE — Progress Notes (Signed)
TR BAND REMOVAL  LOCATION:    Right radial  DEFLATED PER PROTOCOL:    Yes.    TIME BAND OFF / DRESSING APPLIED:    1300pm A clean dry dressing applied with tegaderm and gauze.    SITE UPON ARRIVAL:    Level 0  SITE AFTER BAND REMOVAL:    Level 0  CIRCULATION SENSATION AND MOVEMENT:    Within Normal Limits   Yes.    COMMENTS:   Care instruction given to patient.

## 2021-07-02 NOTE — Discharge Instructions (Addendum)
Your catheterization today did not show any significant blockages, though there was spasm of one of the arteries that went away with medication (nitroglycerin).  A dangerous heart rhythm was encountered during the procedure (ventricular fibrillation), which required [place] to shock your heart.  You may notice some soreness of your ribs/chest for the next day or two.  Our preference would be to monitor you overnight, though you have requested to be discharged today following 6 hours of observation.  If you develop any chest pain, racing heart beat, or dizziness/lightheadedness, please call 911.  You should not drive or be alone for 24 hours from the time of your procedure.

## 2021-07-02 NOTE — Telephone Encounter (Signed)
Patient reviewed labs via My Chart and had procedure done today. Closing encounter at this time.

## 2021-07-02 NOTE — Progress Notes (Signed)
D/C instructions reviewed w/patient and husband(on cell phone) then copy given to patient to take home. Assisted to get dressed. By wheelchair, discharged to home; husband picked patient up in vehicle.

## 2021-07-02 NOTE — Interval H&P Note (Signed)
History and Physical Interval Note:  07/02/2021 9:02 AM  Caroline Conley  has presented today for surgery, with the diagnosis of chest pain, cardiomyopathy, and abnormal stress test.  The various methods of treatment have been discussed with the patient and family. After consideration of risks, benefits and other options for treatment, the patient has consented to  Procedure(s): LEFT HEART CATH AND CORONARY ANGIOGRAPHY (N/A) as a surgical intervention.  The patient's history has been reviewed, patient examined, no change in status, stable for surgery.  I have reviewed the patient's chart and labs.  Questions were answered to the patient's satisfaction.    Cath Lab Visit (complete for each Cath Lab visit)  Clinical Evaluation Leading to the Procedure:   ACS: No.  Non-ACS:    Anginal Classification: CCS IV  Anti-ischemic medical therapy: Minimal Therapy (1 class of medications)  Non-Invasive Test Results: Intermediate-risk stress test findings: cardiac mortality 1-3%/year  Prior CABG: No previous CABG  Caroline Conley

## 2021-07-02 NOTE — Discharge Summary (Signed)
Discharge Summary    Patient ID: Caroline Conley MRN: [id number]; DOB: 1966-01-22  Admit date: 07/02/2021 Discharge date: 07/02/2021  PCP:  Olin Hauser, DO   [place] [facility] Cardiologist:  [name], MD    Discharge Diagnoses    Principal Problem:   Abnormal stress test Active Problems:   Chest pain   Cardiomyopathy [facility])   Ventricular fibrillation [facility])    Diagnostic Studies/Procedures    LHC: Coronary vasospasm with 30-40% stenosis of the proximal RCA, which resolved with intracoronary nitroglycerin.  Otherwise, no angiographically significant coronary artery disease. Mildly elevated left ventricular filling pressure. Ventricular fibrillation precipitated by intracoronary nitroglycerin injection into the RCA successfully treated with defibrillation x 1.  Echocardiogram:  1. Left ventricular ejection fraction, by estimation, is 35 to 40%. The  left ventricle has moderately decreased function. The left ventricle  demonstrates global hypokinesis.   2. Right ventricular systolic function is normal. The right ventricular  size is normal.   3. The mitral valve is normal in structure. Mild mitral valve  regurgitation. No evidence of mitral stenosis.   4. The aortic valve is normal in structure. Aortic valve regurgitation is  not visualized. No aortic stenosis is present.   5. The inferior vena cava is normal in size with greater than 50%  respiratory variability, suggesting right atrial pressure of 3 mmHg.   _____________   History of Present Illness     Caroline Conley is a 55 y.o. female with history of palpitations, atypical chest pain with clean coronary arteries by LHC in 2015, COPD, prior tobacco use, and basal cell carcinoma, presenting for evaluation of chest pain and shortness of breath with recently diagnosed LBBB, cardiomyopathy, and abnormal stress test.  Hospital Course     Consultants: None   Patient underwent diagnostic  catheterization that showed vasospasm of the proximal RCA that resolved with intracoronary nitroglycerin and otherwise no significant coronary artery disease.  She developed ventricular fibrillation during intracoronary nitroglycerin injection of the right coronary artery, successfully treated with defibrillation x 1.  She does not have any neurologic deficits.  She feels well without chest pain or shortness of breath.  She has been electrically stable since the procedure ended (5.5 hours ago).  The patient is admitted for overnight observation.  She has requested to be discharged home.  I have reviewed today's event and my recommendation that she be monitored overnight.  She acknowledges these risks and is adamant that she wishes to be discharged home.  Did the patient have an acute coronary syndrome (MI, NSTEMI, STEMI, etc) this admission?:  No                               Did the patient have a percutaneous coronary intervention (stent / angioplasty)?:  No.    _____________  Discharge Vitals Blood pressure 128/64, pulse 87, resp. rate 12, height 5\' 6"  (1.676 m), weight 62.1 kg, SpO2 96 %.  Filed Weights   07/02/21 0800 07/02/21 0837  Weight: 62.1 kg 62.1 kg    Labs & Radiologic Studies    CBC No results for input(s): WBC, NEUTROABS, HGB, HCT, MCV, PLT in the last 72 hours. Basic Metabolic Panel No results for input(s): NA, K, CL, CO2, GLUCOSE, BUN, CREATININE, CALCIUM, MG, PHOS in the last 72 hours. Liver Function Tests No results for input(s): AST, ALT, ALKPHOS, BILITOT, PROT, ALBUMIN in the last 72 hours. No results for input(s): LIPASE,  AMYLASE in the last 72 hours. High Sensitivity Troponin:   No results for input(s): TROPONINIHS in the last 720 hours.  BNP Invalid input(s): POCBNP D-Dimer No results for input(s): DDIMER in the last 72 hours. Hemoglobin A1C No results for input(s): HGBA1C in the last 72 hours. Fasting Lipid Panel No results for input(s): CHOL, HDL, LDLCALC,  TRIG, CHOLHDL, LDLDIRECT in the last 72 hours. Thyroid Function Tests No results for input(s): TSH, T4TOTAL, T3FREE, THYROIDAB in the last 72 hours.  Invalid input(s): FREET3 _____________  CARDIAC CATHETERIZATION  Result Date: 07/02/2021 Conclusions: Coronary vasospasm with 30-40% stenosis of the proximal RCA, which resolved with intracoronary nitroglycerin.  Otherwise, no angiographically significant coronary artery disease. Mildly elevated left ventricular filling pressure. Ventricular fibrillation precipitated by intracoronary nitroglycerin injection into the RCA successfully treated with defibrillation x 1. Recommendations: Admit for overnight observation. Repeat limited echocardiogram to reassess LVEF in the setting of ventricular fibrillation. Switch diltiazem to bisoprolol 2.5 mg daily in the setting of recently discovered cardiomyopathy.  Consider adding ACEI/ARB if renal function and blood pressure tolerate. [name], MD [facility]  NM Myocar Multi W/Spect Tamela Oddi Motion / EF  Result Date: 06/25/2021  Defect 1: There is a medium defect of mild severity present in the mid anterior and apical anterior location.  Findings consistent with possible LAD ischemia.  This is an intermediate risk study.  The left ventricular ejection fraction is normal (55-65%).  Suboptimal study overall due to GI uptake as well as bundle branch block with decreased specificity of the above findings. Consider alternative testing.  CT attenuation images with no significant aortic or coronary calcifications.    ECHOCARDIOGRAM COMPLETE  Result Date: 06/25/2021    ECHOCARDIOGRAM REPORT   Patient Name:   Caroline Conley Date of Exam: 06/22/2021 Medical Rec #:  [id number]      Height:       66.0 in Accession #:    [id number]     Weight:       136.0 lb Date of Birth:  05/02/1966      BSA:          1.697 m Patient Age:    40 years       BP:           112/60 mmHg Patient Gender: F              HR:           78  bpm. Exam Location:  [facility] Procedure: 2D Echo, 3D Echo, Cardiac Doppler, Color Doppler and Strain Analysis Indications:    R06.02 SOB; I44.7 LBBB  History:        Patient has prior history of Echocardiogram examinations, most                 recent 08/04/2018. COPD, Arrythmias:LBBB,                 Signs/Symptoms:Shortness of Breath and Chest Pain; Risk                 Factors:Former Smoker and Dyslipidemia. Pre-op cardiac                 assessment.  Sonographer:    Pilar Jarvis RDMS, RVT, RDCS Referring Phys: [place]  1. Left ventricular ejection fraction, by estimation, is 40 %. The left ventricle has mildly decreased function. The left ventricle demonstrates regional wall motion abnormalities (Anterior/anteroseptal and septal hypokinesis). Left ventricular diastolic parameters are consistent with Grade I  diastolic dysfunction (impaired relaxation). The average left ventricular global longitudinal strain is -13.7 %. The global longitudinal strain is abnormal.  2. Right ventricular systolic function is normal. The right ventricular size is normal.  3. The mitral valve is normal in structure. Mild to moderate mitral valve regurgitation. Comparison(s): Previous echo reported 60-65% LVEF. FINDINGS  Left Ventricle: Left ventricular ejection fraction, by estimation, is 40 to 45%. The left ventricle has mildly decreased function. The left ventricle demonstrates regional wall motion abnormalities. The average left ventricular global longitudinal strain is -13.7 %. The global longitudinal strain is abnormal. The left ventricular internal cavity size was normal in size. There is no left ventricular hypertrophy. Left ventricular diastolic parameters are consistent with Grade I diastolic dysfunction  (impaired relaxation). Right Ventricle: The right ventricular size is normal. No increase in right ventricular wall thickness. Right ventricular systolic function is normal. Left Atrium: Left atrial  size was normal in size. Right Atrium: Right atrial size was normal in size. Pericardium: There is no evidence of pericardial effusion. Mitral Valve: The mitral valve is normal in structure. Mild to moderate mitral valve regurgitation. No evidence of mitral valve stenosis. Tricuspid Valve: The tricuspid valve is normal in structure. Tricuspid valve regurgitation is not demonstrated. No evidence of tricuspid stenosis. Aortic Valve: The aortic valve was not well visualized. Aortic valve regurgitation is not visualized. No aortic stenosis is present. Aortic valve mean gradient measures 3.0 mmHg. Aortic valve peak gradient measures 5.9 mmHg. Aortic valve area, by VTI measures 2.44 cm. Pulmonic Valve: The pulmonic valve was normal in structure. Pulmonic valve regurgitation is not visualized. No evidence of pulmonic stenosis. Aorta: The aortic root is normal in size and structure. Venous: The inferior vena cava is normal in size with greater than 50% respiratory variability, suggesting right atrial pressure of 3 mmHg. IAS/Shunts: No atrial level shunt detected by color flow Doppler.  LEFT VENTRICLE PLAX 2D LVIDd:         4.30 cm     Diastology LVIDs:         2.75 cm     LV e' medial:    6.20 cm/s LV PW:         0.95 cm     LV E/e' medial:  11.4 LV IVS:        0.90 cm     LV e' lateral:   5.87 cm/s LVOT diam:     2.00 cm     LV E/e' lateral: 12.0 LV SV:         53 LV SV Index:   31          2D Longitudinal Strain LVOT Area:     3.14 cm    2D Strain GLS Avg:     -13.7 %  LV Volumes (MOD) LV vol d, MOD A2C: 67.9 ml LV vol d, MOD A4C: 88.2 ml LV vol s, MOD A2C: 36.8 ml LV vol s, MOD A4C: 51.4 ml LV SV MOD A2C:     31.1 ml LV SV MOD A4C:     88.2 ml LV SV MOD BP:      34.6 ml RIGHT VENTRICLE             IVC RV Basal diam:  2.90 cm     IVC diam: 1.20 cm RV Mid diam:    2.90 cm RV S prime:     10.70 cm/s TAPSE (M-mode): 2.0 cm LEFT ATRIUM  Index       RIGHT ATRIUM          Index LA diam:        3.40 cm 2.00 cm/m   RA Area:     7.62 cm LA Vol (A2C):   30.3 ml 17.85 ml/m RA Volume:   12.50 ml 7.36 ml/m LA Vol (A4C):   36.9 ml 21.74 ml/m LA Biplane Vol: 34.8 ml 20.50 ml/m  AORTIC VALVE                   PULMONIC VALVE AV Area (Vmax):    2.35 cm    PV Vmax:       1.06 m/s AV Area (Vmean):   2.16 cm    PV Peak grad:  4.5 mmHg AV Area (VTI):     2.44 cm AV Vmax:           121.00 cm/s AV Vmean:          85.400 cm/s AV VTI:            0.219 m AV Peak Grad:      5.9 mmHg AV Mean Grad:      3.0 mmHg LVOT Vmax:         90.70 cm/s LVOT Vmean:        58.600 cm/s LVOT VTI:          0.170 m LVOT/AV VTI ratio: 0.78  AORTA Ao Root diam: 2.80 cm Ao Asc diam:  2.70 cm Ao Arch diam: 2.6 cm MITRAL VALVE MV Area (PHT): 3.56 cm    SHUNTS MV Decel Time: 213 msec    Systemic VTI:  0.17 m MV E velocity: 70.60 cm/s  Systemic Diam: 2.00 cm MV A velocity: 86.70 cm/s MV E/A ratio:  0.81 Ida Rogue MD Electronically signed by Ida Rogue MD Signature Date/Time: 06/25/2021/7:57:35 AM    Final    ECHOCARDIOGRAM LIMITED  Result Date: 07/02/2021    ECHOCARDIOGRAM LIMITED REPORT   Patient Name:   Caroline Conley Date of Exam: 07/02/2021 Medical Rec #:  [id number]      Height:       66.0 in Accession #:    [id number]     Weight:       137.0 lb Date of Birth:  1966/12/12      BSA:          1.703 m Patient Age:    79 years       BP:           112/82 mmHg Patient Gender: F              HR:           80 bpm. Exam Location:  Inpatient Procedure: 2D Echo, Limited Echo, 3D Echo, Cardiac Doppler and Color Doppler Indications:    I49.01 Ventricular fibrillation  History:        Patient has prior history of Echocardiogram examinations, most                 recent 06/22/2021. Cardiomyopathy, Abnormal ECG, Arrythmias:LBBB                 and Tachycardia, Signs/Symptoms:Chest Pain, Shortness of Breath                 and Dyspnea; Risk Factors:Current Smoker.  Sonographer:    Roseanna Rainbow RDCS Referring Phys: [phone] Wadsworth Skolnick  Sonographer Comments: Patient  supine, post cath. IMPRESSIONS  1. Left ventricular  ejection fraction, by estimation, is 35 to 40%. The left ventricle has moderately decreased function. The left ventricle demonstrates global hypokinesis.  2. Right ventricular systolic function is normal. The right ventricular size is normal.  3. The mitral valve is normal in structure. Mild mitral valve regurgitation. No evidence of mitral stenosis.  4. The aortic valve is normal in structure. Aortic valve regurgitation is not visualized. No aortic stenosis is present.  5. The inferior vena cava is normal in size with greater than 50% respiratory variability, suggesting right atrial pressure of 3 mmHg. Comparison(s): No significant change from prior study. Prior images reviewed side by side. FINDINGS  Left Ventricle: Left ventricular ejection fraction, by estimation, is 35 to 40%. The left ventricle has moderately decreased function. The left ventricle demonstrates global hypokinesis. The left ventricular internal cavity size was normal in size. There is no left ventricular hypertrophy. Right Ventricle: The right ventricular size is normal. No increase in right ventricular wall thickness. Right ventricular systolic function is normal. Left Atrium: Left atrial size was normal in size. Right Atrium: Right atrial size was normal in size. Pericardium: There is no evidence of pericardial effusion. Mitral Valve: The mitral valve is normal in structure. Mild mitral valve regurgitation. No evidence of mitral valve stenosis. Tricuspid Valve: The tricuspid valve is normal in structure. Tricuspid valve regurgitation is not demonstrated. No evidence of tricuspid stenosis. Aortic Valve: The aortic valve is normal in structure. Aortic valve regurgitation is not visualized. No aortic stenosis is present. Pulmonic Valve: The pulmonic valve was normal in structure. Pulmonic valve regurgitation is not visualized. No evidence of pulmonic stenosis. Aorta: The aortic root is normal in  size and structure. Venous: The inferior vena cava is normal in size with greater than 50% respiratory variability, suggesting right atrial pressure of 3 mmHg. IAS/Shunts: No atrial level shunt detected by color flow Doppler. LEFT VENTRICLE PLAX 2D LVIDd:         4.60 cm LVIDs:         3.90 cm LV PW:         0.90 cm LV IVS:        1.20 cm LVOT diam:     1.70 cm LVOT Area:     2.27 cm  LV Volumes (MOD) LV vol d, MOD A2C: 54.0 ml LV vol d, MOD A4C: 72.7 ml LV vol s, MOD A2C: 36.0 ml LV vol s, MOD A4C: 49.2 ml LV SV MOD A2C:     18.0 ml LV SV MOD A4C:     72.7 ml LV SV MOD BP:      19.0 ml LEFT ATRIUM         Index LA diam:    3.20 cm 1.88 cm/m   AORTA Ao Root diam: 2.90 cm Ao Asc diam:  2.80 cm  SHUNTS Systemic Diam: 1.70 cm Candee Furbish MD Electronically signed by Candee Furbish MD Signature Date/Time: 07/02/2021/1:52:45 PM    Final    Disposition   Pt is being discharged home today in good condition.  Follow-up Plans & Appointments     Follow-up Information     Kawanna Christley, Harrell Gave, MD. Schedule an appointment as soon as possible for a visit in 2 week(s).   Specialty: Cardiology Why: Our office will contact you tomorrow to schedule appointment with Dr. Saunders Revel or one of our NP/PA's. Contact information: [place]  [address] [phone]  Discharge Medications   Allergies as of 07/02/2021       Reactions   2,4-d Dimethylamine (amisol) Itching   (Pt unsure of this allergy)   Morphine And Related Itching   Vicodin [hydrocodone-acetaminophen] Nausea And Vomiting   GI distress        Medication List     STOP taking these medications    aspirin EC 81 MG tablet   diltiazem 180 MG 24 hr capsule Commonly known as: DILACOR XR       TAKE these medications    albuterol (2.5 MG/3ML) 0.083% nebulizer solution Commonly known as: PROVENTIL Take 2.5 mg by nebulization every 6 (six) hours as needed.   ProAir HFA 108 (90 Base) MCG/ACT  inhaler Generic drug: albuterol INHALE 2 PUFFS BY MOUTH EVERY 6 HOURS ASNEEDED WHEEZING/ SHORTNESS OF BREATH   bisoprolol 5 MG tablet Commonly known as: ZEBETA Take 0.5 tablets (2.5 mg total) by mouth daily.   chlorpheniramine-HYDROcodone 10-8 MG/5ML Suer Commonly known as: Tussionex Pennkinetic ER Take 5 mLs by mouth every 12 (twelve) hours as needed for cough.   doxycycline 100 MG tablet Commonly known as: VIBRA-TABS Take 1 tablet (100 mg total) by mouth 2 (two) times daily. For 10 days. Take with full glass of water, stay upright 30 min after taking.   fluconazole 150 MG tablet Commonly known as: DIFLUCAN Take one tablet by mouth on Day 1. Repeat dose 2nd tablet on Day 3.   magic mouthwash w/lidocaine Soln Swish, gargle, and spit one to two teaspoonfuls every six hours as needed. Shake well before using.   oxyCODONE-acetaminophen 10-325 MG tablet Commonly known as: PERCOCET Take 1 tablet by mouth every 4 (four) hours.   predniSONE 20 MG tablet Commonly known as: DELTASONE Take 2 tablets (40 mg total) by mouth daily with breakfast. 10 days   Pulse Oximeter For Finger Misc 1 Device by Does not apply route as needed (for cough, dyspnea). Pulse oximeter for finger for checking oxygen saturation in COPD   roflumilast 500 MCG Tabs tablet Commonly known as: DALIRESP Take 1 tablet (500 mcg total) by mouth daily.   Trelegy Ellipta 100-62.5-25 MCG/INH Aepb Generic drug: Fluticasone-Umeclidin-Vilant Inhale 1 puff into the lungs daily.           Outstanding Labs/Studies   None.  Duration of Discharge Encounter   Greater than 30 minutes including physician time.  Signed, [name], MD 07/02/2021, 4:43 PM

## 2021-07-02 NOTE — Progress Notes (Signed)
Dr. Saunders Revel spoke w/patient and husband (on cell phone). Plan to discharge to home today. IV fluid completed. Ambulated to bathroom, tolerated well. VSS. Denies pain.

## 2021-07-02 NOTE — Progress Notes (Signed)
  Echocardiogram 2D Echocardiogram has been performed.  Bobbye Charleston 07/02/2021, 12:31 PM

## 2021-07-03 ENCOUNTER — Telehealth: Payer: Self-pay

## 2021-07-03 ENCOUNTER — Encounter (HOSPITAL_COMMUNITY): Payer: Self-pay | Admitting: Internal Medicine

## 2021-07-03 NOTE — Telephone Encounter (Signed)
Transition Care Management Follow-up Telephone Call Date of discharge and from where: 07/02/2021- How have you been since you were released from the hospital? Doing Great Any questions or concerns? No  Items Reviewed: Did the pt receive and understand the discharge instructions provided? Yes  Medications obtained and verified? Yes  Other? No  Any new allergies since your discharge? No  Dietary orders reviewed? N/A Do you have support at home? Yes   Home Care and Equipment/Supplies: Were home health services ordered? not applicable If so, what is the name of the agency? N/A  Has the agency set up a time to come to the patient's home? not applicable Were any new equipment or medical supplies ordered?  No What is the name of the medical supply agency? N/A Were you able to get the supplies/equipment? not applicable Do you have any questions related to the use of the equipment or supplies? No  Functional Questionnaire: (I = Independent and D = Dependent) ADLs: I  Bathing/Dressing- I  Meal Prep- I  Eating- I  Maintaining continence- I  Transferring/Ambulation- I  Managing Meds- I  Follow up appointments reviewed:  PCP Hospital f/u appt confirmed? No   Specialist Hospital f/u appt confirmed? Yes  Scheduled to see Dr. Saunders Revel on 07/12/2021 @ 10:00 AM. Are transportation arrangements needed? No  If their condition worsens, is the pt aware to call PCP or go to the Emergency Dept.? Yes Was the patient provided with contact information for the PCP's office or ED? Yes Was to pt encouraged to call back with questions or concerns? Yes

## 2021-07-07 ENCOUNTER — Other Ambulatory Visit: Payer: Self-pay | Admitting: Family Medicine

## 2021-07-07 DIAGNOSIS — B370 Candidal stomatitis: Secondary | ICD-10-CM

## 2021-07-07 DIAGNOSIS — J441 Chronic obstructive pulmonary disease with (acute) exacerbation: Secondary | ICD-10-CM

## 2021-07-08 NOTE — Telephone Encounter (Signed)
Requested medication (s) are due for refill today: yes to both meds  Requested medication (s) are on the active medication list: yes to both  Last refill:  Trelegy Ellipta:10/09/20 #60 3 RF                  Diflucan: 03/12/21 #6 3 RF  Future visit scheduled: no  Notes to clinic:  neither med assigned to a protocol   Requested Prescriptions  Pending Prescriptions Disp [place] 100-62.5-25 MCG/INH AEPB [Pharmacy Med Name: TRELEGY ELLIPTA 100-62.5-25 MCG/INH] 60 each 6    Sig: INHALE 1 PUFF BY MOUTH ONCE DAILY      Off-Protocol Failed - 07/07/2021  2:10 PM      Failed - Medication not assigned to a protocol, review manually.      Passed - Valid encounter within last 12 months    Recent Outpatient Visits           3 months ago Pain in joint of right shoulder   [facility], Devonne Doughty, DO   6 months ago COPD with acute exacerbation [facility])   [facility] Olin Hauser, DO   9 months ago COPD with acute exacerbation [facility])   [facility], Devonne Doughty, DO   10 months ago Cellulitis and abscess of left leg   [facility], Devonne Doughty, DO   1 year ago Acute non-recurrent frontal sinusitis   [place], DO       Future Appointments             In 4 days End, Harrell Gave, MD [place], [facility]   In 2 months Ralene Bathe, MD [place]   In 5 months  [facility], [facility]               fluconazole (DIFLUCAN) 150 MG tablet [Pharmacy Med Name: FLUCONAZOLE 150 MG TAB] 6 tablet 3    Sig: TAKE 1 TABLET BY MOUTH ON DAY 1. REPEAT DOSE 2ND TAB ON DAY 3      Off-Protocol Failed - 07/07/2021  2:10 PM      Failed - Medication not assigned to a protocol, review manually.      Passed - Valid encounter within last 12 months    Recent Outpatient Visits           3 months ago  Pain in joint of right shoulder   [facility] Olin Hauser, DO   6 months ago COPD with acute exacerbation [facility])   [facility] Olin [facility]er, DO   9 months ago COPD with acute exacerbation [facility])   [facility], [facility]   10 months ago Cellulitis and abscess of left leg   [place], DO   1 year ago Acute non-recurrent frontal sinusitis   [place], DO       Future Appointments             In 4 days End, Harrell Gave, MD [facility], [facility]   In 2 months Ralene Bathe, MD [place]   In 5 months  [facility], [facility]

## 2021-07-11 ENCOUNTER — Other Ambulatory Visit: Payer: 59

## 2021-07-12 ENCOUNTER — Encounter: Payer: Self-pay | Admitting: Internal Medicine

## 2021-07-12 ENCOUNTER — Ambulatory Visit (INDEPENDENT_AMBULATORY_CARE_PROVIDER_SITE_OTHER): Payer: 59

## 2021-07-12 ENCOUNTER — Ambulatory Visit (INDEPENDENT_AMBULATORY_CARE_PROVIDER_SITE_OTHER): Payer: 59 | Admitting: Internal Medicine

## 2021-07-12 ENCOUNTER — Telehealth: Payer: Self-pay

## 2021-07-12 ENCOUNTER — Other Ambulatory Visit: Payer: Self-pay

## 2021-07-12 VITALS — BP 99/66 | HR 88 | Ht 66.0 in | Wt 134.0 lb

## 2021-07-12 DIAGNOSIS — M79601 Pain in right arm: Secondary | ICD-10-CM | POA: Diagnosis not present

## 2021-07-12 DIAGNOSIS — I70208 Unspecified atherosclerosis of native arteries of extremities, other extremity: Secondary | ICD-10-CM | POA: Insufficient documentation

## 2021-07-12 DIAGNOSIS — I428 Other cardiomyopathies: Secondary | ICD-10-CM

## 2021-07-12 DIAGNOSIS — R42 Dizziness and giddiness: Secondary | ICD-10-CM

## 2021-07-12 DIAGNOSIS — Z8679 Personal history of other diseases of the circulatory system: Secondary | ICD-10-CM

## 2021-07-12 DIAGNOSIS — I201 Angina pectoris with documented spasm: Secondary | ICD-10-CM

## 2021-07-12 DIAGNOSIS — R002 Palpitations: Secondary | ICD-10-CM | POA: Diagnosis not present

## 2021-07-12 DIAGNOSIS — R55 Syncope and collapse: Secondary | ICD-10-CM

## 2021-07-12 MED ORDER — ENOXAPARIN SODIUM 60 MG/0.6ML IJ SOSY: 60.0000 mg | PREFILLED_SYRINGE | Freq: Two times a day (BID) | INTRAMUSCULAR | 0 refills | Status: DC

## 2021-07-12 NOTE — Telephone Encounter (Signed)
Secure chat received from Dr. Saunders Revel.  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  As she and I discussed this morning, let's start her on Lovenox 60 mg subcutaneously BID x 4 weeks (unless she now would prefer to use Eliquis as an alternative) and repeat a radial artery Duplex in 4 weeks to see if the radial artery recanalizes.  She can use Tylenol and warm compress for pain.  If the pain worsens at all or she develops pain, discoloration, numbness, or weakness in the right hand, she should go to the ED right away.  Unless labs drawn today show significant bump in her creatinine, Eliquis dosing would be 5 mg BID.

## 2021-07-12 NOTE — Progress Notes (Signed)
Follow-up Outpatient Visit Date: 07/12/2021  Primary Care Provider: Olin Hauser, DO [address] [address]  Chief Complaint: Right forearm pain  HPI:  [name] is a 55 y.o. female with history of recently diagnosed nonischemic cardiomyopathy and LBBB, palpitations, atypical chest pain with vasospasm on recent catheterization, iatrogenic ventricular fibrillation with injection of RCA during catheterization (06/2021), prior tobacco use, and basal cell carcinoma, who presents for follow-up of cardiomyopathy after recent catheterization.  I last saw her on 06/20/2021 for preop assessment in the setting of newly diagnosed LBBB.  Subsequent echo showed moderately reduced LVEF.  Stress test was concerning for possible LAD territory ischemia.  [name] underwent catheterization on 07/02/2021, which showed vasospasm of the proximal RCA.  During intracoronary nitroglycerin administration to the RCA, [name] had ventricular fibrillation successfully treated with defibrillation x1.  She did not have any further arrhythmias or neurologic deficits.  Today, [name] reports that she has been having sharp and throbbing pain in the right forearm just above the catheterization site since leaving the hospital.  It has not improved or worsened; she has been applying ice packs at times.  She denies bleeding, as well as pain in the hand, numbness, and weakness.  She has not had any chest pain or shortness of breath.  Two days ago, she had an episode of weakness and dizziness (she states she felt "loopy") accompanied by nausea.  It was somewhat reminiscent of her vertigo in the past.  She did not pass out or experience palpitations.  Symptoms resolved spontaneously with rest over the course of several minutes.  It has not recurred.  --------------------------------------------------------------------------------------------------  Cardiovascular History & Procedures: Cardiovascular  Problems: Tachycardia and palpitations Atypical chest pain Iatrogenic ventricular fibrillation during catheterization   Risk Factors: Tobacco use   Cath/PCI: LHC (07/02/2021): Coronary vasospasm with 30 to 40% stenosis of the proximal RCA, which resolved with intracoronary NTG.  Otherwise, no angiographically significant CAD.  Mildly elevated left ventricular filling pressure (LVEDP 15-20 mmHg).  Ventricular fibrillation precipitated with intracoronary NTG administration into the RCA, successfully treated with defibrillation x1. LHC (2015): Report not available.  Images personally reviewed, demonstrating no significant CAD and normal LVEF.   CV Surgery: None   EP Procedures and Devices: 14-day event monitor (03/04/2019): Limited study due to significant artifact.  Usable tracings show predominantly sinus rhythm with rare PAC's and PVC's.  Two brief atrial runs noted (up to 5 beats). 48-hour Holter monitor (08/06/2018): Predominantly sinus rhythm with rare PAC's and PVC's, as well as brief atrial runs.  Average rate during the monitoring period was elevated (106 bpm).   Non-Invasive Evaluation(s): Limited TTE (07/02/2021): Normal LV size and wall thickness.  LVEF 35-40% with global hypokinesis.  Normal RV size and function.  Mild MR.  No significant change from prior echo on 06/22/2021. Pharmacologic MPI (06/25/2021): Moderate size, mild in severity, mid anterior and apical reversible defect consistent with possible LAD territory ischemia.  LVEF 55-65%.  Intermediate risk study. TTE (06/22/2021): Normal LV size and wall thickness.  LVEF 40% with grade 1 diastolic dysfunction and global longitudinal strain of -13.7%.  Global hypokinesis noted.  Normal RV size and function.  Normal biatrial size.  Mild to moderate mitral regurgitation. TTE (08/04/2018): Normal LV size and wall thickness.  LVEF 60-65% with normal wall motion.  Grade 1 diastolic dysfunction.  Normal RV size and function.  No significant  valvular abnormality.  Recent CV Pertinent Labs: Lab Results  Component Value Date  CHOL 169 07/29/2017   HDL 35 (L) 07/29/2017   LDLCALC 85 07/29/2017   TRIG 243 (H) 07/29/2017   CHOLHDL 4.8 07/29/2017   INR 0.9 10/09/2018   K 3.8 06/27/2021   K 3.6 09/23/2014   MG 1.8 12/31/2018   BUN 8 06/27/2021   BUN 5 (L) 09/23/2014   CREATININE 1.21 (H) 06/27/2021   CREATININE 1.16 (H) 12/31/2018    Past medical and surgical history were reviewed and updated in EPIC.  Current Meds  Medication Sig   albuterol (PROVENTIL) (2.5 MG/3ML) 0.083% nebulizer solution Take 2.5 mg by nebulization every 6 (six) hours as needed.   bisoprolol (ZEBETA) 5 MG tablet Take 0.5 tablets (2.5 mg total) by mouth daily.   chlorpheniramine-HYDROcodone (TUSSIONEX PENNKINETIC ER) 10-8 MG/5ML SUER Take 5 mLs by mouth every 12 (twelve) hours as needed for cough.   fluconazole (DIFLUCAN) 150 MG tablet TAKE 1 TABLET BY MOUTH ON DAY 1. REPEAT DOSE 2ND TAB ON DAY 3   magic mouthwash w/lidocaine SOLN Swish, gargle, and spit one to two teaspoonfuls every six hours as needed. Shake well before using.   Misc. Devices (PULSE OXIMETER FOR FINGER) MISC 1 Device by Does not apply route as needed (for cough, dyspnea). Pulse oximeter for finger for checking oxygen saturation in COPD   oxyCODONE-acetaminophen (PERCOCET) 10-325 MG tablet Take 1 tablet by mouth every 4 (four) hours.   PROAIR HFA 108 (90 Base) MCG/ACT inhaler INHALE 2 PUFFS BY MOUTH EVERY 6 HOURS ASNEEDED WHEEZING/ SHORTNESS OF BREATH   roflumilast (DALIRESP) 500 MCG TABS tablet Take 1 tablet (500 mcg total) by mouth daily.   TRELEGY ELLIPTA 100-62.5-25 MCG/INH AEPB INHALE 1 PUFF BY MOUTH ONCE DAILY    Allergies: 2,4-d dimethylamine (amisol); Morphine and related; and Vicodin [hydrocodone-acetaminophen]  Social History   Tobacco Use   Smoking status: Former    Packs/day: 1.00    Years: 30.00    Pack years: 30.00    Types: Cigarettes    Quit date: 03/02/2018     Years since quitting: 3.3   Smokeless tobacco: Former   Tobacco comments:    0.5 to 1 ppd >30 years  Vaping Use   Vaping Use: Never used  Substance Use Topics   Alcohol use: No   Drug use: Yes    Types: Oxycodone    Family History  Problem Relation Age of Onset   Diabetes Mother    High blood pressure Mother    Gout Mother    Cancer Father    Stroke Father    Arthritis/Rheumatoid Father    Diabetes Father    High blood pressure Father    Heart failure Father    Heart failure Brother    Diabetes Brother     Review of Systems: A 12-system review of systems was performed and was negative except as noted in the HPI.  --------------------------------------------------------------------------------------------------  Physical Exam: BP 99/66 (BP Location: Left Arm, Patient Position: Sitting, Cuff Size: Normal)   Pulse 88   Ht '5\' 6"'$  (1.676 m)   Wt 134 lb (60.8 kg)   SpO2 96%   BMI 21.63 kg/m   General:  NAD. Neck: No JVD or HJR. Lungs: Clear to auscultation bilaterally without wheezes or crackles. Heart: Regular rate and rhythm without murmurs, rubs, or gallops. Abdomen: Soft, nontender, nondistended. Extremities: No lower extremity edema.  Right radial arteriotomy site well-healed without hematoma.  Right radial pulse is trace; left radial pulse is 1+.  Right hand capillary refill, movement, and sensation  are normal.  Pulseox on right index finger is damped but not completely absent with occlusion of the radial artery.  EKG:  Normal sinus rhythm with borderline LBBB. No significant change since 07/02/2021.  Lab Results  Component Value Date   WBC 8.9 06/27/2021   HGB 14.0 06/27/2021   HCT 40.2 06/27/2021   MCV 98.0 06/27/2021   PLT 317 06/27/2021    Lab Results  Component Value Date   NA 139 06/27/2021   K 3.8 06/27/2021   CL 102 06/27/2021   CO2 27 06/27/2021   BUN 8 06/27/2021   CREATININE 1.21 (H) 06/27/2021   GLUCOSE 104 (H) 06/27/2021   ALT 14  05/22/2018    Lab Results  Component Value Date   CHOL 169 07/29/2017   HDL 35 (L) 07/29/2017   LDLCALC 85 07/29/2017   TRIG 243 (H) 07/29/2017   CHOLHDL 4.8 07/29/2017    --------------------------------------------------------------------------------------------------  ASSESSMENT AND PLAN: Non-ischemic cardiomyopathy: [name] remains asymptomatic (NYHA class I) with moderately reduced LVEF and borderline LBBB on EKG again today.  She reports a recent episode of lightheadedness, nausea, and weakness, which is nonspecific but could reflect symptomatic hypotension, with BP being borderline low today.  We have agreed to continue low-dose bisoprolol but will not add any further GDMT at this time.  I will check CBC, CMP, TSH, ferritin, and HIV Ab today.  Dizziness and recent ventricular fibrillation: Recent dizziness at home is nonspecific.  However, given cardiomyopathy and episode of VF during catheterization (felt to be iatrogenic from injection into the RCA), we will obtain a 14-day Zio AT monitor.  Continue low-dose bisoprolol.  Right arm pain: I am concerned that this may reflect irritation of the radial artery from recent catheterization and possible occlusion.  Perfusion via the radial artery appears decreased, though there is no evidence of neurovascular compromise in the right hand.  We will obtain a radial artery Duplex today and initiate therapeutic anticoagulation x 1 month if there is evidence of radial artery occlusion.  Patient encouraged to apply warm compress and use acetaminophen as needed for pain control.  Coronary vasospasm: Noted on recent cath.  No symptoms reported today.  Defer adding long-acting nitrate or CCB given soft BP and reduced LVEF.  Follow-up: Return to clinic in 1 month.  [name], MD 07/12/2021 8:43 AM

## 2021-07-12 NOTE — Telephone Encounter (Signed)
Patient was seen in the office this afternoon for her vascular test. Dominica Severin our Vasc tech asked if I could speak with the patient regarding a medication concern.  Patient sts that since being started on bisoprolol 2.5 mg daily she has had daily nausea and dizziness. She does take her medication with food. She also reports dizziness that she thinks is related to low bp. Her bp has been running low 90's/60's. Today after taking her medication she did vomit. She normally takes her bisoprolol in the morning. I discussed with her about trying to take it at bedtime to see if it would be better tolerated. Patient would like Dr. Darnelle Bos recommendation. Patient sts that she will hold bisoprolol until she hears back. Adv the patient that I will fwd the update to Dr. Saunders Revel as requested.

## 2021-07-12 NOTE — Patient Instructions (Signed)
Medication Instructions:  Your physician recommends that you continue on your current medications as directed. Please refer to the Current Medication list given to you today.  *If you need a refill on your cardiac medications before your next appointment, please call your pharmacy*   Lab Work: Cmp, Cbc, Ferritin, INR, TSH, HIV today  If you have labs (blood work) drawn today and your tests are completely normal, you will receive your results only by: [facility] (if you have MyChart) OR A paper copy in the mail If you have any lab test that is abnormal or we need to change your treatment, we will call you to review the results.   Testing/Procedures: Your physician has requested that you have an upper extremity arterial  duplex.  (To be done today)   Your physician has recommended that you wear a Zio monitor LIVE AT.  (To be worn for 14 days)  This monitor is a medical device that records the heart's electrical activity. Doctors most often use these monitors to diagnose arrhythmias. Arrhythmias are problems with the speed or rhythm of the heartbeat. The monitor is a small device applied to your chest. You can wear one while you do your normal daily activities. While wearing this monitor if you have any symptoms to push the button and record what you felt. Once you have worn this monitor for the period of time provider prescribed (Usually 14 days), you will return the monitor device in the postage paid box. Once it is returned they will download the data collected and provide [place] with a report which the provider will then review and we will call you with those results. Important tips:  Avoid showering during the first 24 hours of wearing the monitor. Avoid excessive sweating to help maximize wear time. Do not submerge the device, no hot tubs, and no swimming pools. Keep any lotions or oils away from the patch. After 24 hours you may shower with the patch on. Take brief showers with your  back facing the shower head.  Do not remove patch once it has been placed because that will interrupt data and decrease adhesive wear time. Push the button when you have any symptoms and write down what you were feeling. Once you have completed wearing your monitor, remove and place into box which has postage paid and place in your outgoing mailbox.  If for some reason you have misplaced your box then call our office and we can provide another box and/or mail it off for you.    Follow-Up: At [facility], you and your health needs are our priority.  As part of our continuing mission to provide you with exceptional heart care, we have created designated Provider Care Teams.  These Care Teams include your primary Cardiologist (physician) and Advanced Practice Providers (APPs -  Physician Assistants and Nurse Practitioners) who all work together to provide you with the care you need, when you need it.  We recommend signing up for the patient portal called "MyChart".  Sign up information is provided on this After Visit Summary.  MyChart is used to connect with patients for Virtual Visits (Telemedicine).  Patients are able to view lab/test results, encounter notes, upcoming appointments, etc.  Non-urgent messages can be sent to your provider as well.   To learn more about what you can do with MyChart, go to [email or web address].    Your next appointment:   4 week(s)  The format for your next appointment:   In Person  Provider:   You may see [name], MD or one of the following Advanced Practice Providers on your designated Care Team:   Murray Hodgkins, NP Christell Faith, PA-C Marrianne Mood, PA-C Cadence Kathlen Mody, [place]   Other Instructions N/A

## 2021-07-12 NOTE — Telephone Encounter (Signed)
Given soft BP and reported dizziness with N/V, let's have her stop bisoprolol and see if the symptoms improve.  If they do, we may need to consider alternative beta blocker or different class of medication for her cardiomyopathy.  In addition, prelim report of radial artery Duplex indicates occlusion of the right radial artery, as suspected based on exam in the office today.  Given that she is symptomatic, treatment is indicated.  I discussed anticoagulation options with Ms. Osu this AM (Lovenox as standard tx and apixaban as potential alternative).  She preferred Lovenox.  Unless she has reconsidered and wishes to try apixaban, I suggest that we start Lovenox 60 mg subcutaneously BID x 4 weeks, with repeat right radial artery Duplex in 4 weeks.  She should go to the ED for any worsening pain in the right forearm or new pain, color change, numbness, or weakness in the right hand.  [name], MD [facility]

## 2021-07-12 NOTE — Telephone Encounter (Signed)
Lovenox '60mg'$  BID x 4 weeks sent to pharmacy

## 2021-07-12 NOTE — Telephone Encounter (Signed)
Patient made aware of Dr. Marya Landry recommendations.  Stop Bisoprolol. She will provide an update in 1 week as to whether her symptoms have resolved.   Patient confirms that she would prefer to start Lovenox vs Eliquis. Start Lovenox injections 60 mg bid for 4 weeks.  ( I asked for assistance from our PharmD, Pharm sent in the Rx) Patient understands how to administer subq injections since she has had to give lovenox in the past.  F/u UEA duplex in 4 weeks.  Patient confirmed understanding as when to seek emergent care. Patient verbalized understanding to the instructions given and voiced appreciation for the assiatnce.

## 2021-07-13 LAB — COMPREHENSIVE METABOLIC PANEL
ALT: 11 IU/L (ref 0–32)
AST: 28 IU/L (ref 0–40)
Albumin/Globulin Ratio: 1.7 (ref 1.2–2.2)
Albumin: 3.8 g/dL (ref 3.8–4.9)
Alkaline Phosphatase: 110 IU/L (ref 44–121)
BUN/Creatinine Ratio: 5 — ABNORMAL LOW (ref 9–23)
BUN: 6 mg/dL (ref 6–24)
Bilirubin Total: 0.3 mg/dL (ref 0.0–1.2)
CO2: 23 mmol/L (ref 20–29)
Calcium: 9.7 mg/dL (ref 8.7–10.2)
Chloride: 99 mmol/L (ref 96–106)
Creatinine, Ser: 1.2 mg/dL — ABNORMAL HIGH (ref 0.57–1.00)
Globulin, Total: 2.3 g/dL (ref 1.5–4.5)
Glucose: 108 mg/dL — ABNORMAL HIGH (ref 65–99)
Potassium: 3.8 mmol/L (ref 3.5–5.2)
Sodium: 138 mmol/L (ref 134–144)
Total Protein: 6.1 g/dL (ref 6.0–8.5)
eGFR: 53 mL/min/{1.73_m2} — ABNORMAL LOW (ref >59)

## 2021-07-13 LAB — CBC
Hematocrit: 37.6 % (ref 34.0–46.6)
Hemoglobin: 13.1 g/dL (ref 11.1–15.9)
MCH: 34.2 pg — ABNORMAL HIGH (ref 26.6–33.0)
MCHC: 34.8 g/dL (ref 31.5–35.7)
MCV: 98 fL — ABNORMAL HIGH (ref 79–97)
Platelets: 362 10*3/uL (ref 150–450)
RBC: 3.83 x10E6/uL (ref 3.77–5.28)
RDW: 12.6 % (ref 11.7–15.4)
WBC: 9.2 10*3/uL (ref 3.4–10.8)

## 2021-07-13 LAB — PROTIME-INR
INR: 1 (ref 0.9–1.2)
Prothrombin Time: 10.3 s (ref 9.1–12.0)

## 2021-07-13 LAB — HIV ANTIBODY (ROUTINE TESTING W REFLEX): HIV Screen 4th Generation wRfx: NONREACTIVE

## 2021-07-13 LAB — TSH: TSH: 1.84 u[IU]/mL (ref 0.450–4.500)

## 2021-07-13 LAB — FERRITIN: Ferritin: 150 ng/mL (ref 15–150)

## 2021-07-16 ENCOUNTER — Telehealth: Payer: Self-pay | Admitting: Internal Medicine

## 2021-07-16 NOTE — Telephone Encounter (Signed)
Caroline Conley reports that she is feeling well.  Right forearm pain and resolved.  She continued on enoxaparin without difficulty, which we will continue to complete a 4 week course.  She also notes nausea and dizziness resolved with discontinuation of bisoprolol.  We will defer rechallenging her with a beta-blocker or other new medication, given soft BP at recent office visit, until she follows up on 08/16/2021 with [name], NP.  Lab results from last week were also reviewed with [name]; no significant abnormalities were noted.  [name], MD [facility]

## 2021-07-23 ENCOUNTER — Telehealth: Payer: Self-pay | Admitting: Internal Medicine

## 2021-07-23 DIAGNOSIS — I70208 Unspecified atherosclerosis of native arteries of extremities, other extremity: Secondary | ICD-10-CM

## 2021-07-23 NOTE — Telephone Encounter (Signed)
UE arterial duplex scheduled 08/14/21.

## 2021-07-23 NOTE — Telephone Encounter (Signed)
Pt c/o medication issue:  1. Name of Medication: lovenox  2. How are you currently taking this medication (dosage and times per day)? 60 mg sq q 12   3. Are you having a reaction (difficulty breathing--STAT)? no  4. What is your medication issue? Patient calling to discuss lovenox .  She wants to know if she needs to monitor her blood since this is a anti coag.

## 2021-07-24 NOTE — Telephone Encounter (Signed)
Spoke with patient. Advised that we would recommend checking CBC/BMP just once while on lovenox. She is going out of town and will be back Saturday. [facility].

## 2021-07-24 NOTE — Telephone Encounter (Signed)
No drug levels need to be checked, however patient probably should have a CBC/BMP checked after about 2 weeks of therapy. Called and LVM for patient to call back.

## 2021-07-25 DIAGNOSIS — R002 Palpitations: Secondary | ICD-10-CM | POA: Diagnosis not present

## 2021-07-25 DIAGNOSIS — R55 Syncope and collapse: Secondary | ICD-10-CM | POA: Diagnosis not present

## 2021-08-02 ENCOUNTER — Telehealth: Payer: Self-pay | Admitting: Internal Medicine

## 2021-08-02 NOTE — Telephone Encounter (Signed)
Patient calling  Patient arm starting to hurt again from blood clot  Please call to discuss

## 2021-08-02 NOTE — Telephone Encounter (Signed)
error 

## 2021-08-03 NOTE — Telephone Encounter (Signed)
Spoke with pt, she c/o pain in right forearm "at the exact same place as previously" that returned yesterday.  "Yesterday pain was 6/10, but today it is only 1/10."  Pt denies swelling, color change, numbness or any other symptoms associated with arm pain.   Asked pt to confirm she was still continuing Lovenox as prescribed for 4 weeks and pt said she was only given 2 weeks at the pharmacy and her last dose was Wednesday of last week.   Verified Rx is correct. I contacted [facility] and spoke with pharmacist and he confirmed he would reach out to pt to ensure she received the remaining 2 weeks. Unsure of miscommunication regarding pt receiving full 4 weeks.  Pt does confirm she has been taking 1 injection as prescribed (0.25m / 60 mg) TWICE daily, every 12 hours. And pt does now have correct amount to complete remaining 2 weeks.   Dr. ESaunders Revelmade aware. Pt will continue remaining 2 weeks of Lovenox, will use Tylenol as needed for minimal pain, and will call our office with any worsening pain, or change in symptoms.

## 2021-08-03 NOTE — Telephone Encounter (Signed)
Attempted to call pt back. No answer. Lmtcb.   See telephone notes 7/28 and 8/1 for further detail.  Pt currently on Lovenox with repeat UEA scheduled 8/31 and f/u 9/1.

## 2021-08-09 ENCOUNTER — Other Ambulatory Visit: Payer: Self-pay

## 2021-08-09 ENCOUNTER — Telehealth (INDEPENDENT_AMBULATORY_CARE_PROVIDER_SITE_OTHER): Payer: 59 | Admitting: Family Medicine

## 2021-08-09 ENCOUNTER — Encounter: Payer: Self-pay | Admitting: Family Medicine

## 2021-08-09 VITALS — Ht 66.0 in | Wt 134.0 lb

## 2021-08-09 DIAGNOSIS — H938X3 Other specified disorders of ear, bilateral: Secondary | ICD-10-CM | POA: Diagnosis not present

## 2021-08-09 DIAGNOSIS — J441 Chronic obstructive pulmonary disease with (acute) exacerbation: Secondary | ICD-10-CM | POA: Diagnosis not present

## 2021-08-09 DIAGNOSIS — H9193 Unspecified hearing loss, bilateral: Secondary | ICD-10-CM | POA: Diagnosis not present

## 2021-08-09 DIAGNOSIS — B370 Candidal stomatitis: Secondary | ICD-10-CM | POA: Diagnosis not present

## 2021-08-09 MED ORDER — PREDNISONE 50 MG PO TABS: 50.0000 mg | ORAL_TABLET | Freq: Every day | ORAL | 0 refills | Status: DC

## 2021-08-09 MED ORDER — HYDROCOD POLST-CPM POLST ER 10-8 MG/5ML PO SUER: 5.0000 mL | Freq: Two times a day (BID) | ORAL | 0 refills | Status: DC | PRN

## 2021-08-09 MED ORDER — LEVOFLOXACIN 500 MG PO TABS: 500.0000 mg | ORAL_TABLET | Freq: Every day | ORAL | 0 refills | Status: DC

## 2021-08-09 MED ORDER — FLUCONAZOLE 150 MG PO TABS: ORAL_TABLET | ORAL | 3 refills | Status: DC

## 2021-08-09 NOTE — Patient Instructions (Addendum)
   Please schedule a Follow-up Appointment to: Return if symptoms worsen or fail to improve.  If you have any other questions or concerns, please feel free to call the office or send a message through MyChart. You may also schedule an earlier appointment if necessary.  Additionally, you may be receiving a survey about your experience at our office within a few days to 1 week by e-mail or mail. We value your feedback.  Elihue Ebert, DO [facility], [facility] 

## 2021-08-09 NOTE — Progress Notes (Signed)
Virtual Visit via Telephone The purpose of this virtual visit is to provide medical care while limiting exposure to the novel coronavirus (COVID19) for both patient and office staff.  Consent was obtained for phone visit:  Yes.   Answered questions that patient had about telehealth interaction:  Yes.   I discussed the limitations, risks, security and privacy concerns of performing an evaluation and management service by telephone. I also discussed with the patient that there may be a patient responsible charge related to this service. The patient expressed understanding and agreed to proceed.  Patient Location: Home Provider Location: [facility] (Office)  Participants in virtual visit: - Patient: Caroline Conley - CMA: Orinda Kenner, CMA - Provider: Dr Parks Ranger  ---------------------------------------------------------------------- Chief Complaint  Patient presents with   COPD    S: Reviewed CMA documentation. I have called patient and gathered additional HPI as follows:  Acute COPD Exacerbation Followed by Pulmonology Dr Mortimer Fries last seen 06/06/21 for flare, treated with Levaquin, Prednisone Reports that symptoms started 2 weeks ago with flare up COPD, now with thick productive yellow sputum shortness of breath.  Denies any high risk travel to areas of current concern for COVID19. Denies any known or suspected exposure to person with or possibly with COVID19.  Denies any fevers, chills, sweats, body ache, headache, abdominal pain, diarrhea  Ear Pressure Pain Reports was on airplane 2 weeks ago and ears popped causing pain and pressure difficulty hearing now needs to return to ENT  Past Medical History:  Diagnosis Date   Atypical chest pain    a. 2015 Cath ([place]): Nl cors. Nl EF; b. 10/2018 MV: No ischemia/infarct. EF>65%.   Cancer ([place]) 01/2016   basal cell    Emphysema of lung ([place])    Palpitations    a. 07/2018 Echo: EF 60-65%, no rwma, Gr1 DD, Nl RV  fxn; b. 07/2018 48h Holter: predominantly RSR, avg rate 106 (69-166 bpm), Rare PAC's and occas PVC's. 2 atrial runs up to 5 beats. No sustained arrhythmias or prolonged pauses. No symptoms reported.   Wears dentures    full upper and lower   Social History   Tobacco Use   Smoking status: Former    Packs/day: 1.00    Years: 30.00    Pack years: 30.00    Types: Cigarettes    Quit date: 03/02/2018    Years since quitting: 3.4   Smokeless tobacco: Former   Tobacco comments:    0.5 to 1 ppd >30 years  Vaping Use   Vaping Use: Never used  Substance Use Topics   Alcohol use: No   Drug use: Yes    Types: Oxycodone    Current Outpatient Medications:    albuterol (PROVENTIL) (2.5 MG/3ML) 0.083% nebulizer solution, Take 2.5 mg by nebulization every 6 (six) hours as needed., Disp: , Rfl:    chlorpheniramine-HYDROcodone (TUSSIONEX PENNKINETIC ER) 10-8 MG/5ML SUER, Take 5 mLs by mouth every 12 (twelve) hours as needed for cough., Disp: 140 mL, Rfl: 0   enoxaparin (LOVENOX) 60 MG/0.6ML injection, Inject 0.6 mLs (60 mg total) into the skin every 12 (twelve) hours., Disp: 33.6 mL, Rfl: 0   fluconazole (DIFLUCAN) 150 MG tablet, TAKE 1 TABLET BY MOUTH ON DAY 1. REPEAT DOSE 2ND TAB ON DAY 3, Disp: 6 tablet, Rfl: 3   levofloxacin (LEVAQUIN) 500 MG tablet, Take 1 tablet (500 mg total) by mouth daily. For 7 days, Disp: 7 tablet, Rfl: 0   magic mouthwash w/lidocaine SOLN, Swish, gargle, and  spit one to two teaspoonfuls every six hours as needed. Shake well before using., Disp: 120 mL, Rfl: 0   Misc. Devices (PULSE OXIMETER FOR FINGER) MISC, 1 Device by Does not apply route as needed (for cough, dyspnea). Pulse oximeter for finger for checking oxygen saturation in COPD, Disp: 1 each, Rfl: 0   oxyCODONE-acetaminophen (PERCOCET) 10-325 MG tablet, Take 1 tablet by mouth every 4 (four) hours., Disp: , Rfl:    predniSONE (DELTASONE) 50 MG tablet, Take 1 tablet (50 mg total) by mouth daily with breakfast., Disp: 5  tablet, Rfl: 0   PROAIR HFA 108 (90 Base) MCG/ACT inhaler, INHALE 2 PUFFS BY MOUTH EVERY 6 HOURS ASNEEDED WHEEZING/ SHORTNESS OF BREATH, Disp: 8.5 g, Rfl: 2   roflumilast (DALIRESP) 500 MCG TABS tablet, Take 1 tablet (500 mcg total) by mouth daily., Disp: 30 tablet, Rfl: 5   TRELEGY ELLIPTA 100-62.5-25 MCG/INH AEPB, INHALE 1 PUFF BY MOUTH ONCE DAILY, Disp: 60 each, Rfl: 6  Depression screen [facility] 2/9 12/19/2020 11/24/2019 10/05/2019  Decreased Interest 0 0 0  Down, Depressed, Hopeless 0 0 0  PHQ - 2 Score 0 0 0  Altered sleeping - 0 0  Tired, decreased energy - 0 0  Change in appetite - 0 0  Feeling bad or failure about yourself  - 0 0  Trouble concentrating - 0 0  Moving slowly or fidgety/restless - 0 0  Suicidal thoughts - 0 0  PHQ-9 Score - 0 0  Some recent data might be hidden    No flowsheet data found.  -------------------------------------------------------------------------- O: No physical exam performed due to remote telephone encounter.  Lab results reviewed.  Recent Results (from the past 2160 hour(s))  CBC per protocol     Status: Abnormal   Collection Time: 06/19/21  1:01 PM  Result Value Ref Range   WBC 11.3 (H) 4.0 - 10.5 K/uL   RBC 3.69 (L) 3.87 - 5.11 MIL/uL   Hemoglobin 13.0 12.0 - 15.0 g/dL   HCT 37.3 36.0 - 46.0 %   MCV 101.1 (H) 80.0 - 100.0 fL   MCH 35.2 (H) 26.0 - 34.0 pg   MCHC 34.9 30.0 - 36.0 g/dL   RDW 13.0 11.5 - 15.5 %   Platelets 319 150 - 400 K/uL   nRBC 0.0 0.0 - 0.2 %    Comment: Performed at [facility], [address] [place], [place] [address]  ECHOCARDIOGRAM COMPLETE     Status: None   Collection Time: 06/22/21  4:37 PM  Result Value Ref Range   AR max vel 2.35 cm2   AV Peak grad 5.9 mmHg   Ao pk vel 1.21 m/s   S' Lateral 2.75 cm   Area-P 1/2 3.56 cm2   AV Area VTI 2.44 cm2   AV Mean grad 3.0 mmHg   Single Plane A4C EF 41.7 %   Single Plane A2C EF 45.8 %   Calc EF 43.8 %   AV Area mean vel 2.16 cm2  NM Myocar Multi  W/Spect W/Wall Motion / EF     Status: None   Collection Time: 06/25/21 11:55 AM  Result Value Ref Range   Rest HR 76 bpm   Rest BP 95/70 mmHg   Percent HR 75 %   Peak HR 125 bpm   Peak BP 109/84 mmHg   MPHR 165 bpm   SSS 1    SRS 2    SDS 6    TID 1.09    LV sys  vol 42 mL   LV dias vol 93 46 - 106 mL  CBC     Status: Abnormal   Collection Time: 06/27/21  3:10 PM  Result Value Ref Range   WBC 8.9 4.0 - 10.5 K/uL   RBC 4.10 3.87 - 5.11 MIL/uL   Hemoglobin 14.0 12.0 - 15.0 g/dL   HCT 40.2 36.0 - 46.0 %   MCV 98.0 80.0 - 100.0 fL   MCH 34.1 (H) 26.0 - 34.0 pg   MCHC 34.8 30.0 - 36.0 g/dL   RDW 12.7 11.5 - 15.5 %   Platelets 317 150 - 400 K/uL   nRBC 0.0 0.0 - 0.2 %    Comment: Performed at [facility], [address], [place], [place] [address]  Basic Metabolic Panel (BMET)     Status: Abnormal   Collection Time: 06/27/21  3:10 PM  Result Value Ref Range   Sodium 139 135 - 145 mmol/L   Potassium 3.8 3.5 - 5.1 mmol/L   Chloride 102 98 - 111 mmol/L   CO2 27 22 - 32 mmol/L   Glucose, Bld 104 (H) 70 - 99 mg/dL    Comment: Glucose reference range applies only to samples taken after fasting for at least 8 hours.   BUN 8 6 - 20 mg/dL   Creatinine, Ser 1.21 (H) 0.44 - 1.00 mg/dL   Calcium 9.6 8.9 - 10.3 mg/dL   GFR, Estimated 53 (L) >60 mL/min    Comment: (NOTE) Calculated using the CKD-EPI Creatinine Equation (2021)    Anion gap 10 5 - 15    Comment: Performed at [facility], [place], [place] [address]  SARS CORONAVIRUS 2 (TAT 6-24 HRS) Nasopharyngeal Nasopharyngeal Swab     Status: None   Collection Time: 07/02/21 11:21 AM   Specimen: Nasopharyngeal Swab  Result Value Ref Range   SARS Coronavirus 2 NEGATIVE NEGATIVE    Comment: (NOTE) SARS-CoV-2 target nucleic acids are NOT DETECTED.  The SARS-CoV-2 RNA is generally detectable in upper and lower respiratory specimens during the acute phase of infection. Negative results do not  preclude SARS-CoV-2 infection, do not rule out co-infections with other pathogens, and should not be used as the sole basis for treatment or other patient management decisions. Negative results must be combined with clinical observations, patient history, and epidemiological information. The expected result is Negative.  Fact Sheet for Patients: SugarRoll.be  Fact Sheet for Healthcare Providers: https://www.woods-mathews.com/  This test is not yet approved or cleared by the [place] FDA and  has been authorized for detection and/or diagnosis of SARS-CoV-2 by FDA under an Emergency Use Authorization (EUA). This EUA will remain  in effect (meaning this test can be used) for the duration of the COVID-19 declaration under Se ction 564(b)(1) of the Act, 21 U.S.C. section 360bbb-3(b)(1), unless the authorization is terminated or revoked sooner.  Performed at [facility], [place] [address], [place],  [address]   ECHOCARDIOGRAM LIMITED     Status: None   Collection Time: 07/02/21 12:31 PM  Result Value Ref Range   Weight 2,192 oz   Height 66 in   BP 116/65 mmHg   Single Plane A2C EF 33.3 %   Single Plane A4C EF 32.3 %   Calc EF 30.0 %   S' Lateral 3.90 cm  Ferritin     Status: None   Collection Time: 07/12/21  9:23 AM  Result Value Ref Range   Ferritin 150 15 - 150  ng/mL  TSH     Status: None   Collection Time: 07/12/21  9:23 AM  Result Value Ref Range   TSH 1.840 0.450 - 4.500 uIU/mL  HIV antibody (with reflex)     Status: None   Collection Time: 07/12/21  9:23 AM  Result Value Ref Range   HIV Screen 4th Generation wRfx Non Reactive Non Reactive    Comment: HIV Negative HIV-1/HIV-2 antibodies and HIV-1 p24 antigen were NOT detected. There is no laboratory evidence of HIV infection.   CBC     Status: Abnormal   Collection Time: 07/12/21  9:23 AM  Result Value Ref Range   WBC 9.2 3.4 - 10.8 x10E3/uL   RBC 3.83 3.77 -  5.28 x10E6/uL   Hemoglobin 13.1 11.1 - 15.9 g/dL   Hematocrit 37.6 34.0 - 46.6 %   MCV 98 (H) 79 - 97 fL   MCH 34.2 (H) 26.6 - 33.0 pg   MCHC 34.8 31.5 - 35.7 g/dL   RDW 12.6 11.7 - 15.4 %   Platelets 362 150 - 450 x10E3/uL  Comp Met (CMET)     Status: Abnormal   Collection Time: 07/12/21  9:23 AM  Result Value Ref Range   Glucose 108 (H) 65 - 99 mg/dL   BUN 6 6 - 24 mg/dL   Creatinine, Ser 1.20 (H) 0.57 - 1.00 mg/dL   eGFR 53 (L) >59 mL/min/1.73   BUN/Creatinine Ratio 5 (L) 9 - 23   Sodium 138 134 - 144 mmol/L   Potassium 3.8 3.5 - 5.2 mmol/L   Chloride 99 96 - 106 mmol/L   CO2 23 20 - 29 mmol/L   Calcium 9.7 8.7 - 10.2 mg/dL   Total Protein 6.1 6.0 - 8.5 g/dL   Albumin 3.8 3.8 - 4.9 g/dL   Globulin, Total 2.3 1.5 - 4.5 g/dL   Albumin/Globulin Ratio 1.7 1.2 - 2.2   Bilirubin Total 0.3 0.0 - 1.2 mg/dL   Alkaline Phosphatase 110 44 - 121 IU/L   AST 28 0 - 40 IU/L   ALT 11 0 - 32 IU/L  Protime-INR     Status: None   Collection Time: 07/12/21  9:23 AM  Result Value Ref Range   INR 1.0 0.9 - 1.2    Comment: Reference interval is for non-anticoagulated patients. Suggested INR therapeutic range for Vitamin K antagonist therapy:    Standard Dose (moderate intensity                   therapeutic range):       2.0 - 3.0    Higher intensity therapeutic range       2.5 - 3.5    Prothrombin Time 10.3 9.1 - 12.0 sec    -------------------------------------------------------------------------- A&P:  Problem List Items Addressed This Visit   None Visit Diagnoses     COPD with acute exacerbation (HCC)    -  Primary   Relevant Medications   predniSONE (DELTASONE) 50 MG tablet   levofloxacin (LEVAQUIN) 500 MG tablet   chlorpheniramine-HYDROcodone (TUSSIONEX PENNKINETIC ER) 10-8 MG/5ML SUER   Ear pressure, bilateral       Relevant Orders   Ambulatory referral to ENT   Bilateral hearing loss, unspecified hearing loss type       Relevant Orders   Ambulatory referral to ENT    Oral yeast infection       Relevant Medications   fluconazole (DIFLUCAN) 150 MG tablet      # ACUTE COPD EXACERBATION /  Severe COPD Clinically with similar to previous flare acute COPD exacerbation, worsening productive cough with sputum production and some dyspnea No acute history of other focal infection Advanced COPD, seems to have persistent recurrent flares  - Continues on maintenance therapy inhalers/nebs Last imaging CXR 12/2019 Followed by [facility]  Last flare visit 06/06/21   Plan Return for CXR STAT today in afternoon to office. Will send result to her   Start Prednisone 5 day burst 51m daily - we may switch to extended taper if unresolved - in past sometime severe flare requires up to 10 day taper. Start taking Levaquin antibiotic 5047mdaily x 7 days Re order Tussionex for cough Re order diflucan, while using Trelegy   F/u as planned / advised - return within 1 week if not improve  ----  Referral to ENT for bilateral ear pressure / hearing loss.   Orders Placed This Encounter  Procedures   Ambulatory referral to ENT    Referral Priority:   Routine    Referral Type:   Consultation    Referral Reason:   Specialty Services Required    Requested Specialty:   Otolaryngology    Number of Visits Requested:   1     Meds ordered this encounter  Medications   predniSONE (DELTASONE) 50 MG tablet    Sig: Take 1 tablet (50 mg total) by mouth daily with breakfast.    Dispense:  5 tablet    Refill:  0   levofloxacin (LEVAQUIN) 500 MG tablet    Sig: Take 1 tablet (500 mg total) by mouth daily. For 7 days    Dispense:  7 tablet    Refill:  0   fluconazole (DIFLUCAN) 150 MG tablet    Sig: TAKE 1 TABLET BY MOUTH ON DAY 1. REPEAT DOSE 2ND TAB ON DAY 3    Dispense:  6 tablet    Refill:  3   chlorpheniramine-HYDROcodone (TUSSIONEX PENNKINETIC ER) 10-8 MG/5ML SUER    Sig: Take 5 mLs by mouth every 12 (twelve) hours as needed for cough.    Dispense:  140 mL     Refill:  0    Follow-up: - Return PRN  Patient verbalizes understanding with the above medical recommendations including the limitation of remote medical advice.  Specific follow-up and call-back criteria were given for patient to follow-up or seek medical care more urgently if needed.   - Time spent in direct consultation with patient on phone: 8 minutes   AlNobie PutnamDOClyde Hillroup 08/09/2021, 11:43 AM

## 2021-08-15 ENCOUNTER — Ambulatory Visit (INDEPENDENT_AMBULATORY_CARE_PROVIDER_SITE_OTHER): Payer: 59

## 2021-08-15 ENCOUNTER — Other Ambulatory Visit: Payer: Self-pay

## 2021-08-15 DIAGNOSIS — I70208 Unspecified atherosclerosis of native arteries of extremities, other extremity: Secondary | ICD-10-CM | POA: Diagnosis not present

## 2021-08-16 ENCOUNTER — Encounter: Payer: Self-pay | Admitting: Nurse Practitioner

## 2021-08-16 ENCOUNTER — Ambulatory Visit (INDEPENDENT_AMBULATORY_CARE_PROVIDER_SITE_OTHER): Payer: 59 | Admitting: Nurse Practitioner

## 2021-08-16 VITALS — BP 120/70 | HR 96 | Ht 66.0 in | Wt 132.5 lb

## 2021-08-16 DIAGNOSIS — I70208 Unspecified atherosclerosis of native arteries of extremities, other extremity: Secondary | ICD-10-CM | POA: Diagnosis not present

## 2021-08-16 DIAGNOSIS — I201 Angina pectoris with documented spasm: Secondary | ICD-10-CM

## 2021-08-16 DIAGNOSIS — R002 Palpitations: Secondary | ICD-10-CM | POA: Diagnosis not present

## 2021-08-16 DIAGNOSIS — I428 Other cardiomyopathies: Secondary | ICD-10-CM

## 2021-08-16 MED ORDER — ENOXAPARIN SODIUM 60 MG/0.6ML IJ SOSY: 60.0000 mg | PREFILLED_SYRINGE | Freq: Two times a day (BID) | INTRAMUSCULAR | 0 refills | Status: DC

## 2021-08-16 MED ORDER — METOPROLOL SUCCINATE ER 25 MG PO TB24: 12.5000 mg | ORAL_TABLET | Freq: Every day | ORAL | 3 refills | Status: DC

## 2021-08-16 NOTE — Progress Notes (Signed)
Office Visit    Patient Name: Caroline Conley Date of Encounter: 08/16/2021  Primary Care Provider:  Olin Hauser, DO Primary Cardiologist:  Caroline Bush, MD  Chief Complaint    55 year old female with a history of tachypalpitations, atypical chest pain with normal coronary arteries by catheterization 2015 and RCA vasospasm noted on catheterization July 2022, nonischemic cardiomyopathy/HFrEF (EF 35 to 40% July 2022), and post catheterization right radial artery occlusion who presents for follow-up related to cardiomyopathy and radial artery occlusion.  Past Medical History    Past Medical History:  Diagnosis Date   Atypical chest pain    a. 2015 Cath ([facility]): Nl cors. Nl EF; b. 10/2018 MV: No ischemia/infarct. EF>65%.   Cancer ([place]) 01/2016   basal cell    Coronary vasospasm ([place])    a. 06/2021 Cath: LM nl, LADnl, LCX large/nl, RCA 35p (spasm-->tx w/ IC NTG w/ resultant VF req defib).   Emphysema of lung ([place])    NICM (nonischemic cardiomyopathy) ([place])    a. 06/22/2021 Echo: EF 40%, ant/antseptal/septal HK. Gr1 DD. Nl RV fxn. Mild-mod MR; b. 06/2021 Cath: Nl cors w/ RCA vasospasm (35%). VF w/ IC NTG admin req defib; c. 07/02/2021 Echo: EF 35-40%. Glob HK. Mild MR.   Occlusion of right radial artery ([place])    a. 06/2021 s/p cardiac cath-->u/s w/ R radial occlusion from puncture extending up entire forearm-->Lovenox '1mg'$ /kg BID x 4 wks; b. 07/2021 U/S: Thrombus in prox Radial arteray w/ minimal reconstitution of flow - improved w/ multiphasic waveforms in R brachial artery.   Palpitations    a. 07/2018 48h Holter: predominantly RSR, avg rate 106 (69-166 bpm), Rare PAC's and occas PVC's. 2 atrial runs up to 5 beats. No sustained arrhythmias or prolonged pauses. No symptoms reported; b. 07/2021 Zio: Avg HR 102 bpm, intermittent bundle branch block, rare PACs and PVCs, 9 atrial runs, longest 13.7 secs w/ max HR of 226 bpm.   Wears dentures    full upper and lower   Past Surgical  History:  Procedure Laterality Date   ABDOMINAL HYSTERECTOMY     APPENDECTOMY     BACK SURGERY     this will make third time   CARDIAC CATHETERIZATION  09/20/2014   [facility] - Dr. Clayborn Conley   CHOLECYSTECTOMY     COLONOSCOPY WITH PROPOFOL N/A 07/08/2016   Procedure: COLONOSCOPY WITH PROPOFOL;  Surgeon: Caroline Lame, MD;  Location: [facility];  Service: Endoscopy;  Laterality: N/A;   DEBRIDEMENT TENNIS ELBOW     LEFT HEART CATH AND CORONARY ANGIOGRAPHY N/A 07/02/2021   Procedure: LEFT HEART CATH AND CORONARY ANGIOGRAPHY;  Surgeon: Caroline Bush, MD;  Location: [facility];  Service: Cardiovascular;  Laterality: N/A;   METATARSAL OSTEOTOMY  05/18/2013   ROTATOR CUFF REPAIR Right    TONSILLECTOMY      Allergies  Allergies  Allergen Reactions   2,4-D Dimethylamine (Amisol) Itching    (Pt unsure of this allergy)   Morphine And Related Itching   Vicodin [Hydrocodone-Acetaminophen] Nausea And Vomiting    GI distress    History of Present Illness    55 year old female with the above past medical history including atypical chest pain status post catheterization 2015 revealing normal coronary arteries, COPD, prior tobacco abuse, basal cell carcinoma, and palpitations.  In early July, she was seen for preoperative evaluation (pending right shoulder surgery) secondary to a new incomplete left bundle branch block.  Recommendation was made for stress testing and echocardiogram.  Echo showed  an EF of 40% with anterior, anteroseptal, and septal hypokinesis.  Stress testing showed a medium defect of mild severity in the mid anterior and apical anterior location concerning for LAD ischemia.  She then underwent diagnostic catheterization which revealed normal coronary arteries with the exception of 35% proximal stenosis in the RCA deemed to be secondary to coronary vasospasm.  With administration of intracoronary nitroglycerin, patient had ventricular fibrillation and required defibrillation.   A limited echo showed persistent LV dysfunction with an EF of 35 to 40%.  She was placed on low-dose bisoprolol therapy.  At office follow-up on July 28, she complained of severe right wrist/forearm pain.  Radial artery perfusion was noted to be decreased and a radial artery ultrasound was performed and showed occlusion of the right radial artery from the procedure insertion site up the entire forearm.  After discussion with [name], she was placed on Lovenox 1 mg/kg twice daily for planned duration of 4 weeks.  A Zio monitor was placed given prior ventricular fibrillation during catheterization, and this showed an average heart rate of 102 bpm, intermittent bundle branch block, rare PACs and PVCs, and 9 atrial runs, the longest up to 13.7 seconds with a maximal heart rate of 2 and 26 bpm.  She stopped taking bisoprolol secondary to GI upset.  [name] had repeat radial artery ultrasound yesterday, which showed ongoing obstruction in the right radial artery with thrombus in the proximal radial artery and minimal reconstitution of flow.  Overall, this study was improved compared to prior study and multiphasic waveforms were now noted in the brachial artery.  Today, she continues to complain of sharp knifelike pain in the center of her forearm.  She has not had any issues with her hand and notes good color and temperature.  She completed 2 weeks of Lovenox previously and then did not have any refills.  She spent a week in [place] without any Lovenox and then picked up the second 2 weeks when she returned.  She has a few more doses left.  She denies chest pain, dyspnea, palpitations, PND, orthopnea, dizziness, syncope, edema, or early satiety.  She is interested in vascular surgery referral given ongoing pain.  Home Medications    Current Outpatient Medications  Medication Sig Dispense Refill   albuterol (PROVENTIL) (2.5 MG/3ML) 0.083% nebulizer solution Take 2.5 mg by nebulization every 6 (six) hours as  needed.     chlorpheniramine-HYDROcodone (TUSSIONEX PENNKINETIC ER) 10-8 MG/5ML SUER Take 5 mLs by mouth every 12 (twelve) hours as needed for cough. 140 mL 0   fluconazole (DIFLUCAN) 150 MG tablet TAKE 1 TABLET BY MOUTH ON DAY 1. REPEAT DOSE 2ND TAB ON DAY 3 6 tablet 3   levofloxacin (LEVAQUIN) 500 MG tablet Take 1 tablet (500 mg total) by mouth daily. For 7 days 7 tablet 0   magic mouthwash w/lidocaine SOLN Swish, gargle, and spit one to two teaspoonfuls every six hours as needed. Shake well before using. 120 mL 0   metoprolol succinate (TOPROL-XL) 25 MG 24 hr tablet Take 0.5 tablets (12.5 mg total) by mouth daily. 30 tablet 3   Misc. Devices (PULSE OXIMETER FOR FINGER) MISC 1 Device by Does not apply route as needed (for cough, dyspnea). Pulse oximeter for finger for checking oxygen saturation in COPD 1 each 0   oxyCODONE-acetaminophen (PERCOCET) 10-325 MG tablet Take 1 tablet by mouth every 4 (four) hours.     predniSONE (DELTASONE) 50 MG tablet Take 1 tablet (50 mg total) by mouth  daily with breakfast. 5 tablet 0   PROAIR HFA 108 (90 Base) MCG/ACT inhaler INHALE 2 PUFFS BY MOUTH EVERY 6 HOURS ASNEEDED WHEEZING/ SHORTNESS OF BREATH 8.5 g 2   roflumilast (DALIRESP) 500 MCG TABS tablet Take 1 tablet (500 mcg total) by mouth daily. 30 tablet 5   TRELEGY ELLIPTA 100-62.5-25 MCG/INH AEPB INHALE 1 PUFF BY MOUTH ONCE DAILY 60 each 6   enoxaparin (LOVENOX) 60 MG/0.6ML injection Inject 0.6 mLs (60 mg total) into the skin every 12 (twelve) hours for 14 days. 16.8 mL 0   No current facility-administered medications for this visit.     Review of Systems    Ongoing pain of the right forearm.  She does not have any pain in the hand or change to temperature sensation. She denies chest pain, palpitations, dyspnea, pnd, orthopnea, n, v, dizziness, syncope, edema, weight gain, or early satiety.  All other systems reviewed and are otherwise negative except as noted above.  Physical Exam    VS:  BP 120/70 (BP  Location: Left Arm, Patient Position: Sitting, Cuff Size: Normal)   Pulse 96   Ht '5\' 6"'$  (1.676 m)   Wt 132 lb 8 oz (60.1 kg)   SpO2 98%   BMI 21.39 kg/m  , BMI Body mass index is 21.39 kg/m.     GEN: Thin, in no acute distress. HEENT: normal. Neck: Supple, no JVD, carotid bruits, or masses. Cardiac: RRR, no murmurs, rubs, or gallops. No clubbing, cyanosis, edema.  Right radial pulses absent.  2+ right ulnar pulse.  Allen's test reveals normal ulnar filling of the hand with very sluggish filling when the ulnar is occluded. Respiratory:  Respirations regular and unlabored, clear to auscultation bilaterally. GI: Soft, nontender, nondistended, BS + x 4. MS: no deformity or atrophy. Skin: warm and dry, no rash. Neuro:  Strength and sensation are intact. Psych: Normal affect.  Accessory Clinical Findings    ECG personally reviewed by me today -regular sinus rhythm, 96, PACs, left axis deviation, incomplete left bundle branch block with associated ST depression in 1 and aVL- no acute changes.  Lab Results  Component Value Date   WBC 9.2 07/12/2021   HGB 13.1 07/12/2021   HCT 37.6 07/12/2021   MCV 98 (H) 07/12/2021   PLT 362 07/12/2021   Lab Results  Component Value Date   CREATININE 1.20 (H) 07/12/2021   BUN 6 07/12/2021   NA 138 07/12/2021   K 3.8 07/12/2021   CL 99 07/12/2021   CO2 23 07/12/2021   Lab Results  Component Value Date   ALT 11 07/12/2021   AST 28 07/12/2021   ALKPHOS 110 07/12/2021   BILITOT 0.3 07/12/2021   Lab Results  Component Value Date   CHOL 169 07/29/2017   HDL 35 (L) 07/29/2017   LDLCALC 85 07/29/2017   TRIG 243 (H) 07/29/2017   CHOLHDL 4.8 07/29/2017    Lab Results  Component Value Date   HGBA1C 4.9 07/29/2017    Assessment & Plan    1.  Nonischemic cardiomyopathy/HFrEF: EF of 40% by echo in June with anterior, anteroseptal, and septal hypokinesis.  Diagnostic catheterization showed normal coronary arteries with RCA spasm.  She is doing  well without symptoms of heart failure and is euvolemic on examination.  She was tried on bisoprolol in the setting of low EF and elevated heart rates however, this caused GI upset and she discontinued.  She is willing to try Toprol-XL and I will add 12.5 mg daily today.  Blood pressure systolically soft so will hold off on adding an ARB at this time.  2.  Right radial artery occlusion: Status post diagnostic catheterization in July complicated by right forearm pain and finding of right radial artery occlusion.  She has normal ulnar flow.  Ultrasound yesterday showed some improvement in distal radial flow but reconstitution remains minimal.  She was prescribed 4 weeks of therapeutic dosing of Lovenox and after completing 2 weeks, did not have a refill.  She realized this when [place] was [place] the [place].  She therefore went 1 week without Lovenox and then upon return, picked up the additional 2 weeks, which she will finish soon.  Given interruption in anticoagulation with ongoing thrombus, I discussed with Dr. Saunders Revel today, and we will continue Lovenox for an additional 2 weeks, and simply to provide 4 consecutive weeks of therapeutic anticoagulation.  With ongoing forearm pain, patient is interested in vascular surgery referral and we will set her up to be seen in [place].  Her hand is warm with good flow and filling via the right ulnar artery.  3.  Coronary vasospasm/Ventricular fibrillation: This occurred in the setting of diagnostic catheterization and engaging the right coronary artery, which had coronary vasospasm.  No significant ventricular arrhythmias noted on subsequent monitoring.  4.  Tachypalpitations/elevated heart rates: Heart rate 96 today.  She sometimes notes elevation in rates and average rate on Zio monitoring last month was 102 bpm.  She did not tolerate bisoprolol and is willing to try low-dose metoprolol succinate, which I will prescribe today.  5.  Disposition: Patient prescribed  additional 2 weeks of Lovenox today.  She will follow-up with vascular surgery in [place] we will see her back here in 1 month.  Murray Hodgkins, NP 08/16/2021, 5:43 PM

## 2021-08-16 NOTE — Patient Instructions (Signed)
Medication Instructions:  - Your physician has recommended you make the following change in your medication:   1) CONTINUE Lovenox injections at the current dose x 2 more weeks, then you will stop these - a refill has been sent to the pharmacy  2) START Toprol (metoprolol succinate) 25 mg- take 0.5 tablet (12.5 mg) by mouth once daily   *If you need a refill on your cardiac medications before your next appointment, please call your pharmacy*   Lab Work: - none ordered  If you have labs (blood work) drawn today and your tests are completely normal, you will receive your results only by: [facility] (if you have MyChart) OR A paper copy in the mail If you have any lab test that is abnormal or we need to change your treatment, we will call you to review the results.   Testing/Procedures: - You have been referred to : [facility] (Dr. Carlis Abbott or Dr. Donzetta Matters)- right radial artery occlusion post cath  Their office should call you to schedule the appointment.  If you have not heard anything in a weeks time, please call their office at [phone]   Follow-Up: At [facility], you and your health needs are our priority.  As part of our continuing mission to provide you with exceptional heart care, we have created designated Provider Care Teams.  These Care Teams include your primary Cardiologist (physician) and Advanced Practice Providers (APPs -  Physician Assistants and Nurse Practitioners) who all work together to provide you with the care you need, when you need it.  We recommend signing up for the patient portal called "MyChart".  Sign up information is provided on this After Visit Summary.  MyChart is used to connect with patients for Virtual Visits (Telemedicine).  Patients are able to view lab/test results, encounter notes, upcoming appointments, etc.  Non-urgent messages can be sent to your provider as well.   To learn more about what you can do with  MyChart, go to [email or web address].    Your next appointment:   1 month(s)  The format for your next appointment:   In Person  Provider:   You may see [name], MDor one of the following Advanced Practice Providers on your designated Care Team:   Murray Hodgkins, NP    Other Instructions:  TOPROL (Metoprolol Extended-Release) Tablets What is this medication? METOPROLOL (me TOE proe lole) treats high blood pressure and heart failure. It may also be used to prevent chest pain (angina). It works by lowering your blood pressure and heart rate, making it easier for your heart to pump blood to the rest of your body. It belongs to a group of medications called beta blockers. This medicine may be used for other purposes; ask your health care provider or pharmacist if you have questions. COMMON BRAND NAME(S): toprol, Toprol XL What should I tell my care team before I take this medication? They need to know if you have any of these conditions: Diabetes Heart or vessel disease like slow heart rate, worsening heart failure, heart block, sick sinus syndrome, or Raynaud's disease Kidney disease Liver disease Lung or breathing disease, like asthma or emphysema Pheochromocytoma Thyroid disease An unusual or allergic reaction to metoprolol, other beta blockers, medications, foods, dyes, or preservatives Pregnant or trying to get pregnant Breast-feeding How should I use this medication? Take this medication by mouth. Take it as directed on the prescription label at the same time every day. Take it with food.  You may cut the tablet in half if it is scored (has a line in the middle of it). This may help you swallow the tablet if the whole tablet is too big. Be sure to take both halves. Do not take just one-half of the tablet. Keep taking it unless your care team tells you to stop. Talk to your care team about the use of this medication in children. While it may be prescribed for children  as young as 6 years for selected conditions, precautions do apply. Overdosage: If you think you have taken too much of this medicine contact a poison control center or emergency room at once. NOTE: This medicine is only for you. Do not share this medicine with others. What if I miss a dose? If you miss a dose, take it as soon as you can. If it is almost time for your next dose, take only that dose. Do not take double or extra doses. What may interact with this medication? This medication may interact with the following: Certain medications for blood pressure, heart disease, irregular heartbeat Certain medications for depression, like monoamine oxidase (MAO) inhibitors, fluoxetine, or paroxetine Clonidine Dobutamine Epinephrine Isoproterenol Reserpine This list may not describe all possible interactions. Give your health care provider a list of all the medicines, herbs, non-prescription drugs, or dietary supplements you use. Also tell them if you smoke, drink alcohol, or use illegal drugs. Some items may interact with your medicine. What should I watch for while using this medication? Visit your care team for regular checks on your progress. Check your blood pressure as directed. Ask your care team what your blood pressure should be. Also, find out when you should contact them. Do not treat yourself for coughs, colds, or pain while you are using this medication without asking your care team for advice. Some medications may increase your blood pressure. You may get drowsy or dizzy. Do not drive, use machinery, or do anything that needs mental alertness until you know how this medication affects you. Do not stand up or sit up quickly, especially if you are an older patient. This reduces the risk of dizzy or fainting spells. Alcohol may interfere with the effect of this medication. Avoid alcoholic drinks. This medication may increase blood sugar. Ask your care team if changes in diet or medications  are needed if you have diabetes. What side effects may I notice from receiving this medication? Side effects that you should report to your care team as soon as possible: Allergic reactions-skin rash, itching, hives, swelling of the face, lips, tongue, or throat Heart failure-shortness of breath, swelling of the ankles, feet, or hands, sudden weight gain, unusual weakness or fatigue Low blood pressure-dizziness, feeling faint or lightheaded, blurry vision Raynaud's-cool, numb, or painful fingers or toes that may change color from pale, to blue, to red Slow heartbeat-dizziness, feeling faint or lightheaded, confusion, trouble breathing, unusual weakness or fatigue Worsening mood, feelings of depression Side effects that usually do not require medical attention (report to your care team if they continue or are bothersome): Change in sex drive or performance Diarrhea Dizziness Fatigue Headache This list may not describe all possible side effects. Call your doctor for medical advice about side effects. You may report side effects to FDA at 1-800-FDA-1088. Where should I keep my medication? Keep out of the reach of children and pets. Store at room temperature between 20 and 25 degrees C (68 and 77 degrees F). Throw away any unused medication after the expiration  date. NOTE: This sheet is a summary. It may not cover all possible information. If you have questions about this medicine, talk to your doctor, pharmacist, or health care provider.  2022 Elsevier/Gold Standard (2021-01-04 12:55:47)

## 2021-08-29 ENCOUNTER — Encounter: Payer: Self-pay | Admitting: Vascular Surgery

## 2021-08-29 ENCOUNTER — Ambulatory Visit (INDEPENDENT_AMBULATORY_CARE_PROVIDER_SITE_OTHER): Payer: 59 | Admitting: Vascular Surgery

## 2021-08-29 ENCOUNTER — Other Ambulatory Visit: Payer: Self-pay

## 2021-08-29 VITALS — BP 132/84 | HR 97 | Temp 97.9°F | Resp 20 | Ht 66.0 in | Wt 131.0 lb

## 2021-08-29 DIAGNOSIS — I70208 Unspecified atherosclerosis of native arteries of extremities, other extremity: Secondary | ICD-10-CM | POA: Diagnosis not present

## 2021-08-29 NOTE — Progress Notes (Signed)
Patient ID: Caroline Conley, female   DOB: November 11, 1966, 55 y.o.   MRN: [id number]  Reason for Consult: New Patient (Initial Visit)   Referred by [name] *  Subjective:     HPI:  Caroline Conley is a 55 y.o. female underwent coronary angiography via right radial approach in July of this year for preoperative clearance shoulder surgery.  Following the procedure she was noted to have pain in the right arm and found to have occluded right radial artery.  She has not been taking Lovenox.  Her hand is well-perfused she has no hand pain.  She states that she has occasional right forearm pain overlying the radial artery for which she uses an ice pack.  The pain is moderate in intensity and intermittent.  It is nonradiating.  She is not in pain on my exam today.  Past Medical History:  Diagnosis Date   Atypical chest pain    a. 2015 Cath ([place]): Nl cors. Nl EF; b. 10/2018 MV: No ischemia/infarct. EF>65%.   Cancer ([place]) 01/2016   basal cell    Coronary vasospasm ([place])    a. 06/2021 Cath: LM nl, LADnl, LCX large/nl, RCA 35p (spasm-->tx w/ IC NTG w/ resultant VF req defib).   Emphysema of lung ([place])    NICM (nonischemic cardiomyopathy) ([place])    a. 06/22/2021 Echo: EF 40%, ant/antseptal/septal HK. Gr1 DD. Nl RV fxn. Mild-mod MR; b. 06/2021 Cath: Nl cors w/ RCA vasospasm (35%). VF w/ IC NTG admin req defib; c. 07/02/2021 Echo: EF 35-40%. Glob HK. Mild MR.   Occlusion of right radial artery ([place])    a. 06/2021 s/p cardiac cath-->u/s w/ R radial occlusion from puncture extending up entire forearm-->Lovenox '1mg'$ /kg BID x 4 wks; b. 07/2021 U/S: Thrombus in prox Radial arteray w/ minimal reconstitution of flow - improved w/ multiphasic waveforms in R brachial artery.   Palpitations    a. 07/2018 48h Holter: predominantly RSR, avg rate 106 (69-166 bpm), Rare PAC's and occas PVC's. 2 atrial runs up to 5 beats. No sustained arrhythmias or prolonged pauses. No symptoms reported; b. 07/2021 Zio: Avg HR 102  bpm, intermittent bundle branch block, rare PACs and PVCs, 9 atrial runs, longest 13.7 secs w/ max HR of 226 bpm.   Wears dentures    full upper and lower   Family History  Problem Relation Age of Onset   Diabetes Mother    High blood pressure Mother    Gout Mother    Cancer Father    Stroke Father    Arthritis/Rheumatoid Father    Diabetes Father    High blood pressure Father    Heart failure Father    Heart failure Brother    Diabetes Brother    Past Surgical History:  Procedure Laterality Date   ABDOMINAL HYSTERECTOMY     APPENDECTOMY     BACK SURGERY     this will make third time   CARDIAC CATHETERIZATION  09/20/2014   [facility] - Dr. Clayborn Bigness   CHOLECYSTECTOMY     COLONOSCOPY WITH PROPOFOL N/A 07/08/2016   Procedure: COLONOSCOPY WITH PROPOFOL;  Surgeon: Lucilla Lame, MD;  Location: [facility];  Service: Endoscopy;  Laterality: N/A;   DEBRIDEMENT TENNIS ELBOW     LEFT HEART CATH AND CORONARY ANGIOGRAPHY N/A 07/02/2021   Procedure: LEFT HEART CATH AND CORONARY ANGIOGRAPHY;  Surgeon: [name], MD;  Location: [facility];  Service: Cardiovascular;  Laterality: N/A;   METATARSAL OSTEOTOMY  05/18/2013  ROTATOR CUFF REPAIR Right    TONSILLECTOMY      Short Social History:  Social History   Tobacco Use   Smoking status: Former    Packs/day: 1.00    Years: 30.00    Pack years: 30.00    Types: Cigarettes    Quit date: 03/02/2018    Years since quitting: 3.4   Smokeless tobacco: Former   Tobacco comments:    0.5 to 1 ppd >30 years  Substance Use Topics   Alcohol use: No    Allergies  Allergen Reactions   2,4-D Dimethylamine (Amisol) Itching    (Pt unsure of this allergy)   Morphine And Related Itching   Vicodin [Hydrocodone-Acetaminophen] Nausea And Vomiting    GI distress    Current Outpatient Medications  Medication Sig Dispense Refill   albuterol (PROVENTIL) (2.5 MG/3ML) 0.083% nebulizer solution Take 2.5 mg by nebulization every 6 (six)  hours as needed.     chlorpheniramine-HYDROcodone (TUSSIONEX PENNKINETIC ER) 10-8 MG/5ML SUER Take 5 mLs by mouth every 12 (twelve) hours as needed for cough. 140 mL 0   enoxaparin (LOVENOX) 60 MG/0.6ML injection Inject 0.6 mLs (60 mg total) into the skin every 12 (twelve) hours for 14 days. 16.8 mL 0   metoprolol succinate (TOPROL-XL) 25 MG 24 hr tablet Take 0.5 tablets (12.5 mg total) by mouth daily. 30 tablet 3   Misc. Devices (PULSE OXIMETER FOR FINGER) MISC 1 Device by Does not apply route as needed (for cough, dyspnea). Pulse oximeter for finger for checking oxygen saturation in COPD 1 each 0   oxyCODONE-acetaminophen (PERCOCET) 10-325 MG tablet Take 1 tablet by mouth every 4 (four) hours.     predniSONE (DELTASONE) 50 MG tablet Take 1 tablet (50 mg total) by mouth daily with breakfast. 5 tablet 0   PROAIR HFA 108 (90 Base) MCG/ACT inhaler INHALE 2 PUFFS BY MOUTH EVERY 6 HOURS ASNEEDED WHEEZING/ SHORTNESS OF BREATH 8.5 g 2   roflumilast (DALIRESP) 500 MCG TABS tablet Take 1 tablet (500 mcg total) by mouth daily. 30 tablet 5   TRELEGY ELLIPTA 100-62.5-25 MCG/INH AEPB INHALE 1 PUFF BY MOUTH ONCE DAILY 60 each 6   fluconazole (DIFLUCAN) 150 MG tablet TAKE 1 TABLET BY MOUTH ON DAY 1. REPEAT DOSE 2ND TAB ON DAY 3 (Patient not taking: Reported on 08/29/2021) 6 tablet 3   magic mouthwash w/lidocaine SOLN Swish, gargle, and spit one to two teaspoonfuls every six hours as needed. Shake well before using. (Patient not taking: Reported on 08/29/2021) 120 mL 0   No current facility-administered medications for this visit.    Review of Systems  Constitutional:  Constitutional negative. HENT: HENT negative.  Eyes: Eyes negative.  Respiratory: Respiratory negative.  Cardiovascular: Cardiovascular negative.  Musculoskeletal:       Right arm pain Skin: Skin negative.  Neurological: Neurological negative. Hematologic: Hematologic/lymphatic negative.  Psychiatric: Psychiatric negative.        Objective:  Objective   Vitals:   08/29/21 1046  BP: 132/84  Pulse: 97  Resp: 20  Temp: 97.9 F (36.6 C)  SpO2: 93%  Weight: 131 lb (59.4 kg)  Height: '5\' 6"'$  (1.676 m)   Body mass index is 21.14 kg/m.  Physical Exam HENT:     Head: Normocephalic.     Nose:     Comments: Wearing a mask Eyes:     Pupils: Pupils are equal, round, and reactive to light.  Cardiovascular:     Pulses:  Radial pulses are 0 on the right side and 2+ on the left side.     Comments: Patient has palpable right ulnar artery pulse at the wrist and a signal at the radial artery that is absent with compression of the ulnar artery.  There is a strong palmar arch signal that is ulnar dependent. Pulmonary:     Effort: Pulmonary effort is normal.  Abdominal:     General: Abdomen is flat.     Palpations: Abdomen is soft.  Skin:    Capillary Refill: Capillary refill takes less than 2 seconds.  Neurological:     General: No focal deficit present.     Mental Status: She is alert.  Psychiatric:        Mood and Affect: Mood normal.        Behavior: Behavior normal.        Thought Content: Thought content normal.        Judgment: Judgment normal.    Data: I evaluated her entire radial artery in her forearm today with duplex ultrasound in the patient's room.  Radial artery is occluded does not appear compressible and is nonpulsatile without color flow.     Assessment/Plan:    54 year old female with occluded right radial artery status post coronary artery angiography.  Given that her hand is well-perfused I am unsure that any intervention would assist in the patient's pain.  I discussed the options being endovascular approach I am unsure how this would be helpful, I did discuss possible open thromboembolectomy but again given the 79-monthtime frame since the intervention and also the lack of hand symptoms I am unsure how effective this would be.  Patient desires for no intervention at this time and can  follow-up with me on an as-needed basis.     BWaynetta SandyMD [facility]

## 2021-09-17 ENCOUNTER — Ambulatory Visit: Payer: 59 | Admitting: Nurse Practitioner

## 2021-09-19 ENCOUNTER — Other Ambulatory Visit: Payer: Self-pay | Admitting: Family Medicine

## 2021-09-19 DIAGNOSIS — J441 Chronic obstructive pulmonary disease with (acute) exacerbation: Secondary | ICD-10-CM

## 2021-09-19 DIAGNOSIS — J431 Panlobular emphysema: Secondary | ICD-10-CM

## 2021-09-27 ENCOUNTER — Ambulatory Visit (INDEPENDENT_AMBULATORY_CARE_PROVIDER_SITE_OTHER): Payer: 59 | Admitting: Dermatology

## 2021-09-27 ENCOUNTER — Other Ambulatory Visit: Payer: Self-pay

## 2021-09-27 DIAGNOSIS — Z1283 Encounter for screening for malignant neoplasm of skin: Secondary | ICD-10-CM | POA: Diagnosis not present

## 2021-09-27 DIAGNOSIS — L821 Other seborrheic keratosis: Secondary | ICD-10-CM

## 2021-09-27 DIAGNOSIS — Z85820 Personal history of malignant melanoma of skin: Secondary | ICD-10-CM | POA: Diagnosis not present

## 2021-09-27 DIAGNOSIS — D692 Other nonthrombocytopenic purpura: Secondary | ICD-10-CM

## 2021-09-27 DIAGNOSIS — D1800 Hemangioma unspecified site: Secondary | ICD-10-CM

## 2021-09-27 DIAGNOSIS — I781 Nevus, non-neoplastic: Secondary | ICD-10-CM

## 2021-09-27 DIAGNOSIS — L814 Other melanin hyperpigmentation: Secondary | ICD-10-CM

## 2021-09-27 DIAGNOSIS — D229 Melanocytic nevi, unspecified: Secondary | ICD-10-CM

## 2021-09-27 DIAGNOSIS — L578 Other skin changes due to chronic exposure to nonionizing radiation: Secondary | ICD-10-CM | POA: Diagnosis not present

## 2021-09-27 NOTE — Progress Notes (Signed)
   New Patient Visit  Subjective  Caroline Conley is a 55 y.o. female who presents for the following: Annual Exam. New patient here for mole check, patient report hx of Melanoma.  The patient presents for Total-Body Skin Exam (TBSE) for skin cancer screening and mole check.   The following portions of the chart were reviewed this encounter and updated as appropriate:   Tobacco  Allergies  Meds  Problems  Med Hx  Surg Hx  Fam Hx     Review of Systems:  No other skin or systemic complaints except as noted in HPI or Assessment and Plan.  Objective  Well appearing patient in no apparent distress; mood and affect are within normal limits.  A full examination was performed including scalp, head, eyes, ears, nose, lips, neck, chest, axillae, abdomen, back, buttocks, bilateral upper extremities, bilateral lower extremities, hands, feet, fingers, toes, fingernails, and toenails. All findings within normal limits unless otherwise noted below.  forehead Patch of dilated blood vessels  L neck, back Scars clear to visual exam and palpation, no lymphadenopathy   Assessment & Plan  Telangiectasia forehead  Benign, observe.    History of melanoma L neck, back  Clear. Observe for recurrence. Call clinic for new or changing lesions.  Recommend regular skin exams, daily broad-spectrum spf 30+ sunscreen use, and photoprotection.    Txted at [facility], bx from Dr. Evorn Gong  Will request records from Dr. Evorn Gong  Skin cancer screening  Lentigines - Scattered tan macules - Due to sun exposure - Benign-appearing, observe - Recommend daily broad spectrum sunscreen SPF 30+ to sun-exposed areas, reapply every 2 hours as needed. - Call for any changes  Seborrheic Keratoses - Stuck-on, waxy, tan-brown papules and/or plaques  - Benign-appearing - Discussed benign etiology and prognosis. - Observe - Call for any changes  Melanocytic Nevi - Tan-brown and/or pink-flesh-colored symmetric macules  and papules - Benign appearing on exam today - Observation - Call clinic for new or changing moles - Recommend daily use of broad spectrum spf 30+ sunscreen to sun-exposed areas.   Hemangiomas - Red papules - Discussed benign nature - Observe - Call for any changes  Actinic Damage - Chronic condition, secondary to cumulative UV/sun exposure - diffuse scaly erythematous macules with underlying dyspigmentation - Recommend daily broad spectrum sunscreen SPF 30+ to sun-exposed areas, reapply every 2 hours as needed.  - Staying in the shade or wearing long sleeves, sun glasses (UVA+UVB protection) and wide brim hats (4-inch brim around the entire circumference of the hat) are also recommended for sun protection.  - Call for new or changing lesions.  Skin cancer screening performed today.   Purpura - Chronic; persistent and recurrent.  Treatable, but not curable. - Violaceous macules and patches - Benign - Related to trauma, age, sun damage and/or use of blood thinners, chronic use of topical and/or oral steroids - Observe - Can use OTC arnica containing moisturizer such as Dermend Bruise Formula if desired - Call for worsening or other concerns  Return in about 6 months (around 03/28/2022) for TBSE, Hx of Melanoma.  I, Marye Round, CMA, am acting as scribe for Sarina Ser, MD .   I, Othelia Pulling, RMA, am acting as scribe for Sarina Ser, MD . Documentation: I have reviewed the above documentation for accuracy and completeness, and I agree with the above.  Sarina Ser, MD

## 2021-09-27 NOTE — Patient Instructions (Signed)

## 2021-10-01 ENCOUNTER — Encounter: Payer: Self-pay | Admitting: Dermatology

## 2021-10-10 LAB — HM MAMMOGRAPHY

## 2021-10-16 ENCOUNTER — Encounter: Payer: Self-pay | Admitting: Family Medicine

## 2021-10-19 DIAGNOSIS — H524 Presbyopia: Secondary | ICD-10-CM | POA: Diagnosis not present

## 2021-10-26 DIAGNOSIS — M7712 Lateral epicondylitis, left elbow: Secondary | ICD-10-CM | POA: Diagnosis not present

## 2021-10-29 ENCOUNTER — Encounter: Payer: Self-pay | Admitting: Family Medicine

## 2021-10-29 ENCOUNTER — Telehealth (INDEPENDENT_AMBULATORY_CARE_PROVIDER_SITE_OTHER): Payer: 59 | Admitting: Family Medicine

## 2021-10-29 VITALS — Ht 66.0 in | Wt 123.0 lb

## 2021-10-29 DIAGNOSIS — B370 Candidal stomatitis: Secondary | ICD-10-CM | POA: Diagnosis not present

## 2021-10-29 DIAGNOSIS — J441 Chronic obstructive pulmonary disease with (acute) exacerbation: Secondary | ICD-10-CM

## 2021-10-29 MED ORDER — LEVOFLOXACIN 500 MG PO TABS: 500.0000 mg | ORAL_TABLET | Freq: Every day | ORAL | 0 refills | Status: DC

## 2021-10-29 MED ORDER — PREDNISONE 50 MG PO TABS: 50.0000 mg | ORAL_TABLET | Freq: Every day | ORAL | 0 refills | Status: DC

## 2021-10-29 MED ORDER — HYDROCOD POLST-CPM POLST ER 10-8 MG/5ML PO SUER: 5.0000 mL | Freq: Two times a day (BID) | ORAL | 0 refills | Status: DC | PRN

## 2021-10-29 MED ORDER — FLUCONAZOLE 150 MG PO TABS: ORAL_TABLET | ORAL | 3 refills | Status: DC

## 2021-10-29 MED ORDER — MAGIC MOUTHWASH W/LIDOCAINE: ORAL | 0 refills | Status: DC

## 2021-10-29 NOTE — Patient Instructions (Addendum)
Start taking Levaquin antibiotic 500mg  daily x 7 days Prednisone 50 mg x 5 day Cough syrup Diflucan for yeast infection if need Magic mouthwash faxed in  Please schedule a Follow-up Appointment to: Return if symptoms worsen or fail to improve.  If you have any other questions or concerns, please feel free to call the office or send a message through [place]. You may also schedule an earlier appointment if necessary.  Additionally, you may be receiving a survey about your experience at our office within a few days to 1 week by e-mail or mail. We value your feedback.  [name], DO [place]

## 2021-10-29 NOTE — Progress Notes (Signed)
Subjective:    Patient ID: Caroline Conley, female    DOB: 08-20-1966, 55 y.o.   MRN: [id number]  Caroline Conley is a 55 y.o. female presenting on 10/29/2021 for COPD and Cough  Virtual / Telehealth Encounter - Video Visit via MyChart The purpose of this virtual visit is to provide medical care while limiting exposure to the novel coronavirus (COVID19) for both patient and office staff.  Consent was obtained for remote visit:  Yes.   Answered questions that patient had about telehealth interaction:  Yes.   I discussed the limitations, risks, security and privacy concerns of performing an evaluation and management service by video/telephone. I also discussed with the patient that there may be a patient responsible charge related to this service. The patient expressed understanding and agreed to proceed.  Patient Location: Home Provider Location: [facility] (Office)  Participants in virtual visit: - Patient: Caroline Conley - CMA: Orinda Kenner, [place] - Provider: Dr Parks Ranger   HPI  Acute COPD Exacerbation Followed by Pulmonology Dr Mortimer Fries last seen 06/06/21 for flare, treated with Levaquin, Prednisone Last seen by me virtual visit 07/2021 for COPD flare, also resolved w/ Levaquin prednisone, also on other symptom management and often has oral thrush.  Reports that symptoms started 2 days ago with acute flare up COPD, now with thick productive yellow sputum shortness of breath.   Denies any high risk travel to areas of current concern for COVID19. Denies any known or suspected exposure to person with or possibly with COVID19.   Denies any fevers, chills, sweats, body ache, headache, abdominal pain, diarrhea  Depression screen [facility] 2/9 12/19/2020 11/24/2019 10/05/2019  Decreased Interest 0 0 0  Down, Depressed, Hopeless 0 0 0  PHQ - 2 Score 0 0 0  Altered sleeping - 0 0  Tired, decreased energy - 0 0  Change in appetite - 0 0  Feeling bad or failure about yourself   - 0 0  Trouble concentrating - 0 0  Moving slowly or fidgety/restless - 0 0  Suicidal thoughts - 0 0  PHQ-9 Score - 0 0  Some recent data might be hidden    Social History   Tobacco Use   Smoking status: Former    Packs/day: 1.00    Years: 30.00    Pack years: 30.00    Types: Cigarettes    Quit date: 03/02/2018    Years since quitting: 3.6   Smokeless tobacco: Former   Tobacco comments:    0.5 to 1 ppd >30 years  Vaping Use   Vaping Use: Never used  Substance Use Topics   Alcohol use: No   Drug use: Yes    Types: Oxycodone    Review of Systems Per HPI unless specifically indicated above     Objective:    Ht 5\' 6"  (1.676 m)   Wt 123 lb (55.8 kg)   BMI 19.85 kg/m   Wt Readings from Last 3 Encounters:  10/29/21 123 lb (55.8 kg)  08/29/21 131 lb (59.4 kg)  08/16/21 132 lb 8 oz (60.1 kg)    Physical Exam  Note examination was completely remotely via video observation objective data only  Gen - well-appearing, no acute distress or apparent pain, comfortable HEENT - eyes appear clear without discharge or redness Heart/Lungs - cannot examine virtually - observed no evidence of coughing or labored breathing. Abd - cannot examine virtually  Skin - face visible today- no rash Neuro - awake, alert,  oriented Psych - not anxious appearing  Results for orders placed or performed in visit on 10/16/21  HM MAMMOGRAPHY  Result Value Ref Range   HM Mammogram 0-4 Bi-Rad 0-4 Bi-Rad, Self Reported Normal      Assessment & Plan:   Problem List Items Addressed This Visit   None Visit Diagnoses     COPD with acute exacerbation ([place])    -  Primary   Relevant Medications   magic mouthwash w/lidocaine SOLN   levofloxacin (LEVAQUIN) 500 MG tablet   predniSONE (DELTASONE) 50 MG tablet   chlorpheniramine-HYDROcodone (TUSSIONEX PENNKINETIC ER) 10-8 MG/5ML SUER   Oral yeast infection       Relevant Medications   magic mouthwash w/lidocaine SOLN   fluconazole (DIFLUCAN) 150  MG tablet         # ACUTE COPD EXACERBATION / Severe COPD Clinically with similar to previous flare acute COPD exacerbation, worsening productive cough with sputum production and some dyspnea No acute history of other focal infection Advanced COPD, seems to have persistent recurrent flares  - Continues on maintenance therapy inhalers/nebs Last imaging CXR 12/2019 Followed by [facility]   Last flare visit 06/06/21 and 07/2021   Plan Start Prednisone 5 day burst 50mg  daily - we may switch to extended taper if unresolved - in past sometime severe flare requires up to 10 day taper. Start taking Levaquin antibiotic 500mg  daily x 7 days Re order Tussionex for cough Re order diflucan, while using Trelegy Order Magic Mouthwash - fax   Meds ordered this encounter  Medications   magic mouthwash w/lidocaine SOLN    Sig: Swish, gargle, and spit one to two teaspoonfuls every six hours as needed. Shake well before using.    Dispense:  120 mL    Refill:  0    1 Part viscous lidocaine 2%  1 Part Maalox  1 Part diphenhydramine 12.5 mg per 5 ml elixir 1 Part Nystatin   levofloxacin (LEVAQUIN) 500 MG tablet    Sig: Take 1 tablet (500 mg total) by mouth daily. For 7 days    Dispense:  7 tablet    Refill:  0   predniSONE (DELTASONE) 50 MG tablet    Sig: Take 1 tablet (50 mg total) by mouth daily with breakfast.    Dispense:  5 tablet    Refill:  0   chlorpheniramine-HYDROcodone (TUSSIONEX PENNKINETIC ER) 10-8 MG/5ML SUER    Sig: Take 5 mLs by mouth every 12 (twelve) hours as needed for cough.    Dispense:  120 mL    Refill:  0   fluconazole (DIFLUCAN) 150 MG tablet    Sig: TAKE 1 TABLET BY MOUTH ON DAY 1. REPEAT DOSE 2ND TAB ON DAY 3    Dispense:  6 tablet    Refill:  3      Follow up plan: Return if symptoms worsen or fail to improve.   Patient verbalizes understanding with the above medical recommendations including the limitation of remote medical advice.  Specific  follow-up and call-back criteria were given for patient to follow-up or seek medical care more urgently if needed.  Total duration of direct patient care provided via video conference: 10 minutes   [name], [facility] 10/29/2021, 11:47 AM

## 2021-11-14 ENCOUNTER — Ambulatory Visit: Payer: Self-pay

## 2021-11-14 NOTE — Telephone Encounter (Signed)
Pt calling experiencing pain on left rib cage. PT scheduled for appointment on 11/23/2021 but is requesting to speak with a nurse in the meantime.   Please advise.   Left message to call back.

## 2021-11-14 NOTE — Telephone Encounter (Signed)
Returned pt's call.  Pt has been having left side pain under her rib cage for about 3 days. She woke up 3 days ago with this pain and thought she had "slept wrong" and that it was just something pulled and would resolve.  Pain is still present at 8/10 to 9/10.  Pt states that eating makes it worse.  Pt also has had heart issues in the past and is followed by a cardiologist.  Per protocol , pt was advised to go to ED now. Pt refused this advise, stating that there is a 16 hour wait at the ED and she doesn't want to catch the flu or something else waiting there. I told her that when she tells the front desk that she is having chest pain she will be seen immediately. Pt still refuses this advice.  Pt will sty home and continue to monitor her condition and seek medical help in the morning from her cardiologist or Dr. Raliegh Ip.   Reviewed care advice.  Pt will go to the ED if she feels that her condition is becoming worse.         Reason for Disposition  [1] Pain lasts > 10 minutes AND [2] age > 35  Answer Assessment - Initial Assessment Questions 1. LOCATION: "Where does it hurt?"      Left side under ribs 2. RADIATION: "Does the pain shoot anywhere else?" (e.g., chest, back)     no 3. ONSET: "When did the pain begin?" (e.g., minutes, hours or days ago)     3 days ago 4. SUDDEN: "Gradual or sudden onset?"    Sudden  - woke up inpain 5. PATTERN "Does the pain come and go, or is it constant?"    - If constant: "Is it getting better, staying the same, or worsening?"      (Note: Constant means the pain never goes away completely; most serious pain is constant and it progresses)     - If intermittent: "How long does it last?" "Do you have pain now?"     (Note: Intermittent means the pain goes away completely between bouts)     Constant ,but is worse after eating 6. SEVERITY: "How bad is the pain?"  (e.g., Scale 1-10; mild, moderate, or severe)    - MILD (1-3): doesn't interfere with normal  activities, abdomen soft and not tender to touch     - MODERATE (4-7): interferes with normal activities or awakens from sleep, abdomen tender to touch     - SEVERE (8-10): excruciating pain, doubled over, unable to do any normal activities       8-9 7. RECURRENT SYMPTOM: "Have you ever had this type of stomach pain before?" If Yes, ask: "When was the last time?" and "What happened that time?"      no 8. AGGRAVATING FACTORS: "Does anything seem to cause this pain?" (e.g., foods, stress, alcohol)     Food 9. CARDIAC SYMPTOMS: "Do you have any of the following symptoms: chest pain, difficulty breathing, sweating, nausea?"     no 10. OTHER SYMPTOMS: "Do you have any other symptoms?" (e.g., back pain, diarrhea, fever, urination pain, vomiting)       no 11. PREGNANCY: "Is there any chance you are pregnant?" "When was your last menstrual period?"       na  Protocols used: Abdominal Pain - Upper-A-AH

## 2021-11-14 NOTE — Telephone Encounter (Signed)
2nd attempt to each pt. Left VM to call back to discuss symptoms.

## 2021-11-15 ENCOUNTER — Telehealth: Payer: Self-pay | Admitting: Nurse Practitioner

## 2021-11-15 NOTE — Telephone Encounter (Signed)
Pt c/o of Chest Pain: STAT if CP now or developed within 24 hours  1. Are you having CP right now? Yes, located under left rib cage  2. Are you experiencing any other symptoms (ex. SOB, nausea, vomiting, sweating)? Vomiting started a week ago  3. How long have you been experiencing CP? 4 days, states when she eats it gets worse  4. Is your CP continuous or coming and going? Continual  5. Have you taken Nitroglycerin? no ?
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
## 2021-11-16 ENCOUNTER — Ambulatory Visit: Payer: 59 | Admitting: Internal Medicine

## 2021-11-23 ENCOUNTER — Encounter: Payer: Self-pay | Admitting: Family Medicine

## 2021-11-23 ENCOUNTER — Ambulatory Visit (INDEPENDENT_AMBULATORY_CARE_PROVIDER_SITE_OTHER): Payer: 59 | Admitting: Family Medicine

## 2021-11-23 ENCOUNTER — Other Ambulatory Visit: Payer: Self-pay

## 2021-11-23 VITALS — BP 101/85 | HR 88 | Ht 66.0 in | Wt 131.0 lb

## 2021-11-23 DIAGNOSIS — J431 Panlobular emphysema: Secondary | ICD-10-CM

## 2021-11-23 DIAGNOSIS — J441 Chronic obstructive pulmonary disease with (acute) exacerbation: Secondary | ICD-10-CM | POA: Diagnosis not present

## 2021-11-23 MED ORDER — DOXYCYCLINE HYCLATE 100 MG PO TABS: 100.0000 mg | ORAL_TABLET | Freq: Two times a day (BID) | ORAL | 0 refills | Status: DC

## 2021-11-23 MED ORDER — PREDNISONE 20 MG PO TABS: ORAL_TABLET | ORAL | 0 refills | Status: DC

## 2021-11-23 MED ORDER — HYDROCOD POLST-CPM POLST ER 10-8 MG/5ML PO SUER: 5.0000 mL | Freq: Two times a day (BID) | ORAL | 0 refills | Status: DC | PRN

## 2021-11-23 MED ORDER — PROAIR HFA 108 (90 BASE) MCG/ACT IN AERS: 2.0000 | INHALATION_SPRAY | RESPIRATORY_TRACT | 3 refills | Status: DC | PRN

## 2021-11-23 NOTE — Progress Notes (Signed)
Subjective:    Patient ID: Caroline Conley, female    DOB: 08-30-1966, 55 y.o.   MRN: [id number]  Caroline Conley is a 55 y.o. female presenting on 11/23/2021 for chest congestion and COPD   HPI  Acute COPD Exacerbation  - Last visit with me 10/2021, for initial visit for same problem AECOPD, treated with Levaquin Prednisone and cough medicine, see prior notes for background information. - Interval update with improved temporarily but unresolved  - Today patient reports cough sputum production - returned from previous  Has apt in January 2023 for Cardiology and Pulmonology   Denies any high risk travel to areas of current concern for COVID19. Denies any known or suspected exposure to person with or possibly with COVID19.   Denies any fevers, chills, sweats, body ache, headache, abdominal pain, diarrhea   Depression screen [facility] 2/9 12/19/2020 11/24/2019 10/05/2019  Decreased Interest 0 0 0  Down, Depressed, Hopeless 0 0 0  PHQ - 2 Score 0 0 0  Altered sleeping - 0 0  Tired, decreased energy - 0 0  Change in appetite - 0 0  Feeling bad or failure about yourself  - 0 0  Trouble concentrating - 0 0  Moving slowly or fidgety/restless - 0 0  Suicidal thoughts - 0 0  PHQ-9 Score - 0 0  Some recent data might be hidden    Social History   Tobacco Use   Smoking status: Former    Packs/day: 1.00    Years: 30.00    Pack years: 30.00    Types: Cigarettes    Quit date: 03/02/2018    Years since quitting: 3.7   Smokeless tobacco: Former   Tobacco comments:    0.5 to 1 ppd >30 years  Vaping Use   Vaping Use: Never used  Substance Use Topics   Alcohol use: No   Drug use: Yes    Types: Oxycodone    Review of Systems Per HPI unless specifically indicated above     Objective:    BP 101/85   Pulse 88   Ht 5\' 6"  (1.676 m)   Wt 131 lb (59.4 kg)   SpO2 100%   BMI 21.14 kg/m   Wt Readings from Last 3 Encounters:  11/23/21 131 lb (59.4 kg)  10/29/21 123 lb (55.8 kg)   08/29/21 131 lb (59.4 kg)    Physical Exam Vitals and nursing note reviewed.  Constitutional:      General: She is not in acute distress.    Appearance: She is well-developed. She is not diaphoretic.     Comments: Well-appearing, comfortable, cooperative  HENT:     Head: Normocephalic and atraumatic.  Eyes:     General:        Right eye: No discharge.        Left eye: No discharge.     Conjunctiva/sclera: Conjunctivae normal.  Neck:     Thyroid: No thyromegaly.  Cardiovascular:     Rate and Rhythm: Normal rate and regular rhythm.     Heart sounds: Normal heart sounds. No murmur heard. Pulmonary:     Effort: Pulmonary effort is normal. No respiratory distress.     Breath sounds: Wheezing present. No rales.  Musculoskeletal:        General: Normal range of motion.     Cervical back: Normal range of motion and neck supple.  Lymphadenopathy:     Cervical: No cervical adenopathy.  Skin:    General: Skin is warm  and dry.     Findings: No erythema or rash.  Neurological:     Mental Status: She is alert and oriented to person, place, and time.  Psychiatric:        Behavior: Behavior normal.     Comments: Well groomed, good eye contact, normal speech and thoughts   Results for orders placed or performed in visit on 10/16/21  HM MAMMOGRAPHY  Result Value Ref Range   HM Mammogram 0-4 Bi-Rad 0-4 Bi-Rad, Self Reported Normal      Assessment & Plan:   Problem List Items Addressed This Visit     Panlobular emphysema ([place])   Relevant Medications   predniSONE (DELTASONE) 20 MG tablet   chlorpheniramine-HYDROcodone (TUSSIONEX PENNKINETIC ER) 10-8 MG/5ML SUER   PROAIR HFA 108 (90 Base) MCG/ACT inhaler   Other Visit Diagnoses     COPD with acute exacerbation ([place])    -  Primary   Relevant Medications   doxycycline (VIBRA-TABS) 100 MG tablet   predniSONE (DELTASONE) 20 MG tablet   chlorpheniramine-HYDROcodone (TUSSIONEX PENNKINETIC ER) 10-8 MG/5ML SUER   PROAIR HFA 108 (90  Base) MCG/ACT inhaler       Clinically with similar to previous flare acute COPD exacerbation, worsening productive cough with sputum production and some dyspnea No acute history of other focal infection Advanced COPD, seems to have persistent recurrent flares  - Continues on maintenance therapy inhalers/nebs    Plan Doxycycline x 10 day Prednisone x 10 day Re order Tussionex for cough Use Diflucan PRN Trelegy Advised return to Pulm if this flare is unresolved  Meds ordered this encounter  Medications   doxycycline (VIBRA-TABS) 100 MG tablet    Sig: Take 1 tablet (100 mg total) by mouth 2 (two) times daily. For 10 days. Take with full glass of water, stay upright 30 min after taking.    Dispense:  20 tablet    Refill:  0   predniSONE (DELTASONE) 20 MG tablet    Sig: Take 2 tablets daily (40mg ) for 4 days, take 1 tab daily (20mg ) for 4 days, take half tab daily (10mg ) for 2 days    Dispense:  13 tablet    Refill:  0   chlorpheniramine-HYDROcodone (TUSSIONEX PENNKINETIC ER) 10-8 MG/5ML SUER    Sig: Take 5 mLs by mouth every 12 (twelve) hours as needed for cough.    Dispense:  120 mL    Refill:  0   PROAIR HFA 108 (90 Base) MCG/ACT inhaler    Sig: Inhale 2 puffs into the lungs every 4 (four) hours as needed for wheezing or shortness of breath.    Dispense:  8.5 g    Refill:  3    Keep extra refills on file      Follow up plan: Return if symptoms worsen or fail to improve.   Caroline Conley, [facility] 11/23/2021, 1:52 PM

## 2021-11-23 NOTE — Patient Instructions (Addendum)
Thank you for coming to the office today.  Start taking Doxycycline antibiotic 100mg  twice daily for 10 days. Take with full glass of water and stay upright for at least 30 min after taking, may be seated or standing, but should NOT lay down. This is just a safety precaution, if this medicine does not go all the way down throat well it could cause some burning discomfort to throat and esophagus.  10 day prednisone taper  Cough syrup  Have diflucan after if needed  Refilled ProAir  Please schedule a Follow-up Appointment to: Return if symptoms worsen or fail to improve.  If you have any other questions or concerns, please feel free to call the office or send a message through [place]. You may also schedule an earlier appointment if necessary.  Additionally, you may be receiving a survey about your experience at our office within a few days to 1 week by e-mail or mail. We value your feedback.  [name], DO [place]

## 2021-12-25 ENCOUNTER — Ambulatory Visit: Payer: Medicare Other

## 2021-12-27 DIAGNOSIS — T85848A Pain due to other internal prosthetic devices, implants and grafts, initial encounter: Secondary | ICD-10-CM | POA: Diagnosis not present

## 2021-12-27 DIAGNOSIS — M79672 Pain in left foot: Secondary | ICD-10-CM | POA: Diagnosis not present

## 2021-12-31 ENCOUNTER — Encounter: Payer: Self-pay | Admitting: Family Medicine

## 2021-12-31 ENCOUNTER — Ambulatory Visit (INDEPENDENT_AMBULATORY_CARE_PROVIDER_SITE_OTHER): Payer: 59 | Admitting: Family Medicine

## 2021-12-31 ENCOUNTER — Other Ambulatory Visit: Payer: Self-pay

## 2021-12-31 VITALS — BP 128/85 | HR 91 | Ht 66.0 in | Wt 130.0 lb

## 2021-12-31 DIAGNOSIS — M79672 Pain in left foot: Secondary | ICD-10-CM | POA: Diagnosis not present

## 2021-12-31 DIAGNOSIS — T85848S Pain due to other internal prosthetic devices, implants and grafts, sequela: Secondary | ICD-10-CM

## 2021-12-31 NOTE — Progress Notes (Signed)
Subjective:    Patient ID: Caroline Conley, female    DOB: [birth year], 56 y.o.   MRN: [id number]  Caroline Conley is a 56 y.o. female presenting on 12/31/2021 for clearance for surgery and Foot Pain   HPI  Left Foot, 3rd metatarsal, pain from implanted hardware Reports had prior foot surgery approx 5 years ago, had hardware screw placed and has since come loose with pain in bottom of her Left foot.  PRE-OPERATIVE MEDICAL CLEARANCE  Anticipated upcoming surgery - Hardware Removal (Left Foot), by Dr Cleda Mccreedy (at [facility]) in approximately 4 days 01/04/22  Patient is followed by Cardiology (Dr End, at [facility]) - has had prior pre -op clearance. Not requested today  Regarding surgical and anesthesia history: - Known history of several major surgeries in past, and has tolerated general anesthesia well without problem, complication or allergy. No family history of problem with anesthesia - Able to tolerate regular exercise up to >4 METs, walking up flight of stairs - No known history of cardiovascular disease. Never had MI or known CAD. Has had Cardiac Cath recently with no blockages. She has dx of spasm and non ischemic cardiomyopathy - Dx of COPD for pulmonary, currently controlled., quit smoking 02/2018, 1ppd 30 years. She is around 2nd hand smoke - Denies exertional symptoms of chest pain or tightness, dyspnea, coughing, apnea, syncopal episodes, palpitations  Past Surgical History:  Procedure Laterality Date   ABDOMINAL HYSTERECTOMY     APPENDECTOMY     BACK SURGERY     this will make third time   CARDIAC CATHETERIZATION  09/20/2014   [facility] - Dr. Clayborn Bigness   CHOLECYSTECTOMY     COLONOSCOPY WITH PROPOFOL N/A 07/08/2016   Procedure: COLONOSCOPY WITH PROPOFOL;  Surgeon: Lucilla Lame, MD;  Location: [place] [facility];  Service: Endoscopy;  Laterality: N/A;   DEBRIDEMENT TENNIS ELBOW     LEFT HEART CATH AND CORONARY ANGIOGRAPHY N/A 07/02/2021   Procedure: LEFT HEART  CATH AND CORONARY ANGIOGRAPHY;  Surgeon: [name], MD;  Location: [facility];  Service: Cardiovascular;  Laterality: N/A;   METATARSAL OSTEOTOMY  05/18/2013   ROTATOR CUFF REPAIR Right    TONSILLECTOMY      Depression screen [facility] 2/9 12/19/2020 11/24/2019 10/05/2019  Decreased Interest 0 0 0  Down, Depressed, Hopeless 0 0 0  PHQ - 2 Score 0 0 0  Altered sleeping - 0 0  Tired, decreased energy - 0 0  Change in appetite - 0 0  Feeling bad or failure about yourself  - 0 0  Trouble concentrating - 0 0  Moving slowly or fidgety/restless - 0 0  Suicidal thoughts - 0 0  PHQ-9 Score - 0 0  Some recent data might be hidden    Social History   Tobacco Use   Smoking status: Former    Packs/day: 1.00    Years: 30.00    Pack years: 30.00    Types: Cigarettes    Quit date: 03/02/2018    Years since quitting: 3.8   Smokeless tobacco: Former   Tobacco comments:    0.5 to 1 ppd >30 years  Vaping Use   Vaping Use: Never used  Substance Use Topics   Alcohol use: No   Drug use: Yes    Types: Oxycodone    Review of Systems Per HPI unless specifically indicated above     Objective:    BP 128/85    Pulse 91    Ht 5'  6" (1.676 m)    Wt 130 lb (59 kg)    SpO2 100%    BMI 20.98 kg/m   Wt Readings from Last 3 Encounters:  12/31/21 130 lb (59 kg)  11/23/21 131 lb (59.4 kg)  10/29/21 123 lb (55.8 kg)    Physical Exam Vitals and nursing note reviewed.  Constitutional:      General: She is not in acute distress.    Appearance: Normal appearance. She is well-developed. She is not diaphoretic.     Comments: Well-appearing, comfortable, cooperative  HENT:     Head: Normocephalic and atraumatic.  Eyes:     General:        Right eye: No discharge.        Left eye: No discharge.     Conjunctiva/sclera: Conjunctivae normal.  Neck:     Thyroid: No thyromegaly.  Cardiovascular:     Rate and Rhythm: Normal rate and regular rhythm.     Heart sounds: Normal heart sounds. No  murmur heard. Pulmonary:     Effort: Pulmonary effort is normal. No respiratory distress.     Breath sounds: Normal breath sounds. No wheezing or rales.  Musculoskeletal:        General: Normal range of motion.     Cervical back: Normal range of motion and neck supple.  Lymphadenopathy:     Cervical: No cervical adenopathy.  Skin:    General: Skin is warm and dry.     Findings: No erythema or rash.  Neurological:     Mental Status: She is alert and oriented to person, place, and time.  Psychiatric:        Mood and Affect: Mood normal.        Behavior: Behavior normal.        Thought Content: Thought content normal.     Comments: Well groomed, good eye contact, normal speech and thoughts   Results for orders placed or performed in visit on 10/16/21  HM MAMMOGRAPHY  Result Value Ref Range   HM Mammogram 0-4 Bi-Rad 0-4 Bi-Rad, Self Reported Normal      Assessment & Plan:   Problem List Items Addressed This Visit   None Visit Diagnoses     Left foot pain    -  Primary   Pain from implanted hardware, sequela           No orders of the defined types were placed in this encounter.  Pre-op clearance for non-cardiac surgery today, Left Foot hardware removal, by podiatry, low risk procedure for general anesthesia - Previously tolerated prior surgeries with general anesthesia - No known cardiac hx. Appropriate functional status >4 METs - Former smoker quit 4 years ago.  Plan: She is followed by Cardiology but no significant cardiovascular disease that would limit her from surgery  1. Cleared for elective surgery. Completed provided pre op letter - will fax to Dr Cleda Mccreedy [facility] 2. Reviewed last lab panel within past 6 months. 3. No repeat EKG today. Last done 08/16/21 4. Prior CXR on file.   Follow up plan: Return if symptoms worsen or fail to improve.   [name], [facility] 12/31/2021, 8:47 AM

## 2021-12-31 NOTE — Patient Instructions (Addendum)
Thank you for coming to the office today.  Cleared for surgery.  Please schedule a Follow-up Appointment to: Return if symptoms worsen or fail to improve.  If you have any other questions or concerns, please feel free to call the office or send a message through [place]. You may also schedule an earlier appointment if necessary.  Additionally, you may be receiving a survey about your experience at our office within a few days to 1 week by e-mail or mail. We value your feedback.  [name], DO [place]

## 2022-01-01 ENCOUNTER — Other Ambulatory Visit: Payer: Self-pay | Admitting: Podiatry

## 2022-01-01 DIAGNOSIS — M7752 Other enthesopathy of left foot: Secondary | ICD-10-CM | POA: Diagnosis not present

## 2022-01-01 DIAGNOSIS — T85848A Pain due to other internal prosthetic devices, implants and grafts, initial encounter: Secondary | ICD-10-CM | POA: Diagnosis not present

## 2022-01-02 ENCOUNTER — Encounter
Admission: RE | Admit: 2022-01-02 | Discharge: 2022-01-02 | Disposition: A | Discharge status: Home / Self Care | Payer: 59 | Source: Ambulatory Visit | Attending: Podiatry | Admitting: Podiatry

## 2022-01-02 ENCOUNTER — Other Ambulatory Visit: Payer: Self-pay

## 2022-01-02 VITALS — Ht 66.0 in | Wt 128.0 lb

## 2022-01-02 DIAGNOSIS — I428 Other cardiomyopathies: Secondary | ICD-10-CM

## 2022-01-02 DIAGNOSIS — I201 Angina pectoris with documented spasm: Secondary | ICD-10-CM

## 2022-01-02 DIAGNOSIS — Z01812 Encounter for preprocedural laboratory examination: Secondary | ICD-10-CM

## 2022-01-02 HISTORY — DX: Gastritis, unspecified, without bleeding: K29.70

## 2022-01-02 HISTORY — DX: Hyperlipidemia, unspecified: E78.5

## 2022-01-02 NOTE — Patient Instructions (Addendum)
Your procedure is scheduled on: Friday, January 20 Report to the Registration Desk on the 1st floor of the [facility]. To find out your arrival time, please call [phone] between 1PM - 3PM on: Thursday, January 19  REMEMBER: Instructions that are not followed completely may result in serious medical risk, up to and including death; or upon the discretion of your surgeon and anesthesiologist your surgery may need to be rescheduled.  Do not eat food after midnight the night before surgery.  No gum chewing, lozengers or hard candies.  You may however, drink CLEAR liquids up to 2 hours before you are scheduled to arrive for your surgery. Do not drink anything within 2 hours of your scheduled arrival time.  Clear liquids include: - water  - apple juice without pulp - gatorade (not RED, PURPLE, OR BLUE) - black coffee or tea (Do NOT add milk or creamers to the coffee or tea) Do NOT drink anything that is not on this list.  In addition, your doctor has ordered for you to drink the provided  Ensure Pre-Surgery Clear Carbohydrate Drink  Drinking this carbohydrate drink up to two hours before surgery helps to reduce insulin resistance and improve patient outcomes. Please complete drinking 2 hours prior to scheduled arrival time.  TAKE THESE MEDICATIONS THE MORNING OF SURGERY WITH A SIP OF WATER:  Albuterol nebulizer Trelegy inhaler Roflumilast (Daliresp) Oxycodone if needed for pain  Use inhalers on the day of surgery and bring to the hospital.  One week prior to surgery: Stop Anti-inflammatories (NSAIDS) such as Advil, Aleve, Ibuprofen, Motrin, Naproxen, Naprosyn and Aspirin based products such as Excedrin, Goodys Powder, BC Powder. Stop ANY OVER THE COUNTER supplements until after surgery. You may however, continue to take Tylenol if needed for pain up until the day of surgery.  No Alcohol for 24 hours before or after surgery.  No Smoking including e-cigarettes for 24 hours  prior to surgery.  No chewable tobacco products for at least 6 hours prior to surgery.  No nicotine patches on the day of surgery.  Do not use any "recreational" drugs for at least a week prior to your surgery.  Please be advised that the combination of cocaine and anesthesia may have negative outcomes, up to and including death. If you test positive for cocaine, your surgery will be cancelled.  On the morning of surgery brush your teeth with toothpaste and water, you may rinse your mouth with mouthwash if you wish. Do not swallow any toothpaste or mouthwash.  Use CHG Soap as directed on instruction sheet.  Do not wear jewelry, make-up, hairpins, clips or nail polish.  Do not wear lotions, powders, or perfumes.   Do not shave body from the neck down 48 hours prior to surgery just in case you cut yourself which could leave a site for infection.  Also, freshly shaved skin may become irritated if using the CHG soap.  Contact lenses, hearing aids and dentures may not be worn into surgery.  Do not bring valuables to the hospital. [facility] is not responsible for any missing/lost belongings or valuables.   Notify your doctor if there is any change in your medical condition (cold, fever, infection).  Wear comfortable clothing (specific to your surgery type) to the hospital.  After surgery, you can help prevent lung complications by doing breathing exercises.  Take deep breaths and cough every 1-2 hours. Your doctor may order a device called an Incentive Spirometer to help you take deep breaths.  If you are being discharged the day of surgery, you will not be allowed to drive home. You will need a responsible adult (18 years or older) to drive you home and stay with you that night.   If you are taking public transportation, you will need to have a responsible adult (18 years or older) with you. Please confirm with your physician that it is acceptable to use public transportation.    Please call the [place] Dept. at [phone] if you have any questions about these instructions.  Surgery Visitation Policy:  Patients undergoing a surgery or procedure may have one family member or support person with them as long as that person is not COVID-19 positive or experiencing its symptoms.  That person may remain in the waiting area during the procedure and may rotate out with other people.

## 2022-01-03 ENCOUNTER — Encounter
Admission: RE | Admit: 2022-01-03 | Discharge: 2022-01-03 | Disposition: A | Discharge status: Home / Self Care | Payer: 59 | Source: Ambulatory Visit | Attending: Podiatry | Admitting: Podiatry

## 2022-01-03 ENCOUNTER — Telehealth: Payer: Self-pay | Admitting: *Deleted

## 2022-01-03 ENCOUNTER — Other Ambulatory Visit: Payer: Self-pay | Admitting: Urgent Care

## 2022-01-03 ENCOUNTER — Telehealth: Payer: Self-pay | Admitting: Podiatry

## 2022-01-03 DIAGNOSIS — Z01810 Encounter for preprocedural cardiovascular examination: Secondary | ICD-10-CM | POA: Diagnosis not present

## 2022-01-03 DIAGNOSIS — I201 Angina pectoris with documented spasm: Secondary | ICD-10-CM | POA: Diagnosis not present

## 2022-01-03 DIAGNOSIS — J449 Chronic obstructive pulmonary disease, unspecified: Secondary | ICD-10-CM | POA: Diagnosis not present

## 2022-01-03 DIAGNOSIS — Z01812 Encounter for preprocedural laboratory examination: Secondary | ICD-10-CM

## 2022-01-03 DIAGNOSIS — I428 Other cardiomyopathies: Secondary | ICD-10-CM | POA: Insufficient documentation

## 2022-01-03 DIAGNOSIS — Z01818 Encounter for other preprocedural examination: Secondary | ICD-10-CM | POA: Diagnosis not present

## 2022-01-03 DIAGNOSIS — I447 Left bundle-branch block, unspecified: Secondary | ICD-10-CM | POA: Diagnosis not present

## 2022-01-03 LAB — CBC
HCT: 35.5 % — ABNORMAL LOW (ref 36.0–46.0)
Hemoglobin: 12.8 g/dL (ref 12.0–15.0)
MCH: 35.7 pg — ABNORMAL HIGH (ref 26.0–34.0)
MCHC: 36.1 g/dL — ABNORMAL HIGH (ref 30.0–36.0)
MCV: 98.9 fL (ref 80.0–100.0)
Platelets: 314 10*3/uL (ref 150–400)
RBC: 3.59 MIL/uL — ABNORMAL LOW (ref 3.87–5.11)
RDW: 12.7 % (ref 11.5–15.5)
WBC: 7.7 10*3/uL (ref 4.0–10.5)
nRBC: 0 % (ref 0.0–0.2)

## 2022-01-03 LAB — BASIC METABOLIC PANEL
Anion gap: 7 (ref 5–15)
BUN: 7 mg/dL (ref 6–20)
CO2: 24 mmol/L (ref 22–32)
Calcium: 9.4 mg/dL (ref 8.9–10.3)
Chloride: 106 mmol/L (ref 98–111)
Creatinine, Ser: 1.02 mg/dL — ABNORMAL HIGH (ref 0.44–1.00)
GFR, Estimated: >60 mL/min (ref >60)
Glucose, Bld: 103 mg/dL — ABNORMAL HIGH (ref 70–99)
Potassium: 2.9 mmol/L — ABNORMAL LOW (ref 3.5–5.1)
Sodium: 137 mmol/L (ref 135–145)

## 2022-01-03 MED ORDER — CEFAZOLIN SODIUM-DEXTROSE 2-4 GM/100ML-% IV SOLN: 2.0000 g | INTRAVENOUS | Status: DC

## 2022-01-03 MED ORDER — ORAL CARE MOUTH RINSE: 15.0000 mL | Freq: Once | OROMUCOSAL | Status: AC

## 2022-01-03 MED ORDER — FAMOTIDINE 20 MG PO TABS: 20.0000 mg | ORAL_TABLET | Freq: Once | ORAL | Status: AC

## 2022-01-03 MED ORDER — LACTATED RINGERS IV SOLN: INTRAVENOUS | Status: DC

## 2022-01-03 MED ORDER — CHLORHEXIDINE GLUCONATE 0.12 % MT SOLN: 15.0000 mL | Freq: Once | OROMUCOSAL | Status: AC

## 2022-01-03 MED ORDER — CEFAZOLIN SODIUM-DEXTROSE 2-4 GM/100ML-% IV SOLN
2.0000 g | INTRAVENOUS | Status: AC
  Administered 2022-01-04: 2 g via INTRAVENOUS

## 2022-01-03 MED ORDER — POTASSIUM CHLORIDE CRYS ER 20 MEQ PO TBCR
40.0000 meq | EXTENDED_RELEASE_TABLET | Freq: Once | ORAL | 0 refills | Status: DC
Start: 2022-01-03 — End: 2022-01-17

## 2022-01-03 NOTE — Telephone Encounter (Addendum)
"  We've made several attempts to get an operative report for Caroline Conley without any success.  Can you please help me with this?  She had surgery by Dr. Paulla Dolly in 2019.  We need to know what type of screw that he used."  It's in media on 03/03/2018.  Aren't you all on Epic?  "Yes, we are but we're on [facility] and you all are on [facility]."  Yes, that's true but you can do Care Everywhere and see it.  "I don't see it can you fax it to [place]?"  Yes, I will fax it.  What is your fax number?  "It is [phone]."  I faxed the operative report to [facility] at [facility]

## 2022-01-03 NOTE — Telephone Encounter (Signed)
Spoke with Hassan Rowan in their front office and they will reach out to the surgery center. Thank you!

## 2022-01-03 NOTE — Pre-Procedure Instructions (Signed)
Call to patient to inform of low blood potassium level from today's labs. Our Nurse Practitioner is calling in an order to [facility] in [facility] for a potassium supplement and she is to get it ASAP and take it. Her potassium level will be rechecked prior to tomorrow's surgery. Also informed her to notify her PCP to let them know about this as she will need to get it checked again in a week or so. Acknowledges understanding.

## 2022-01-03 NOTE — Telephone Encounter (Signed)
Dr. Sharlotte Alamo called wanting to know what screw set was used for this patient's surgery. Please advise.

## 2022-01-03 NOTE — Telephone Encounter (Signed)
I didn't put the screws in but they can get the op report from the surgery center (I don't know if that will have the screw type on there). Looking at the xrays it looks like [employer]

## 2022-01-03 NOTE — Progress Notes (Signed)
°  [facility] Perioperative Services: Pre-Admission/Anesthesia Testing  Abnormal Lab Notification   Date: 01/03/22  Name: ELLIOTTE MARSALIS MRN:   [id number]  Re: Abnormal labs noted during PAT appointment   Notified:  Provider Name Provider Role Notification Mode  Sharlotte Alamo, DPM Podiatry (surgeon) Routed and/or faxed via [place] and Notes:  ABNORMAL LAB VALUE(S): Lab Results  Component Value Date   K 2.9 (L) 01/03/2022   [name] is scheduled for an elective orthopedic hardware removal from LEFT foot on 01/04/2022. In review of her medication reconciliation, it is noted that the patient is NOT taking any type of prescribed diuretic medications. Please note, in efforts to promote a safe and effective anesthetic course, per current guidelines/standards set by the [facility] anesthesia team, the minimal acceptable K+ level for the patient to proceed with general anesthesia is 3.0 mmol/L. With that being said, at her current level, patient is not safe to undergo anesthesia. In attempts to prevent postponing surgery, I am sending in a single 40 mEq K+ dose for patient to take tonight. Order placed to have K+ rechecked on the day of her procedure to ensure correction of the noted derangement.  Patient encouraged to follow up with her PCP to discuss having labs rechecked after surgery (1-2 weeks) to ensure that levels remain within normal range. May require daily supplementation.   This is a Community education officer; no formal response is required.  Honor Loh, MSN, APRN, FNP-C, CEN [facility]  Peri-operative Services Nurse Practitioner Phone: [phone] Fax: [phone] 01/03/22 3:53 PM

## 2022-01-04 ENCOUNTER — Encounter: Payer: Self-pay | Admitting: Podiatry

## 2022-01-04 ENCOUNTER — Ambulatory Visit
Admission: RE | Admit: 2022-01-04 | Discharge: 2022-01-04 | Disposition: A | Discharge status: Home / Self Care | Payer: 59 | Attending: Podiatry | Admitting: Podiatry

## 2022-01-04 ENCOUNTER — Ambulatory Visit: Payer: 59

## 2022-01-04 ENCOUNTER — Other Ambulatory Visit: Payer: Self-pay

## 2022-01-04 ENCOUNTER — Ambulatory Visit: Payer: 59 | Admitting: Urgent Care

## 2022-01-04 ENCOUNTER — Encounter
Admission: RE | Disposition: A | Discharge status: Home / Self Care | Payer: Self-pay | Source: Home / Self Care | Attending: Podiatry

## 2022-01-04 DIAGNOSIS — T8484XA Pain due to internal orthopedic prosthetic devices, implants and grafts, initial encounter: Secondary | ICD-10-CM | POA: Insufficient documentation

## 2022-01-04 DIAGNOSIS — K219 Gastro-esophageal reflux disease without esophagitis: Secondary | ICD-10-CM | POA: Diagnosis not present

## 2022-01-04 DIAGNOSIS — X58XXXA Exposure to other specified factors, initial encounter: Secondary | ICD-10-CM | POA: Insufficient documentation

## 2022-01-04 DIAGNOSIS — J449 Chronic obstructive pulmonary disease, unspecified: Secondary | ICD-10-CM | POA: Insufficient documentation

## 2022-01-04 DIAGNOSIS — M7752 Other enthesopathy of left foot: Secondary | ICD-10-CM | POA: Diagnosis not present

## 2022-01-04 DIAGNOSIS — Z87891 Personal history of nicotine dependence: Secondary | ICD-10-CM | POA: Diagnosis not present

## 2022-01-04 DIAGNOSIS — T85848A Pain due to other internal prosthetic devices, implants and grafts, initial encounter: Secondary | ICD-10-CM | POA: Diagnosis not present

## 2022-01-04 DIAGNOSIS — E119 Type 2 diabetes mellitus without complications: Secondary | ICD-10-CM | POA: Diagnosis not present

## 2022-01-04 HISTORY — PX: HARDWARE REMOVAL: SHX979

## 2022-01-04 LAB — POCT I-STAT, CHEM 8
BUN: 4 mg/dL — ABNORMAL LOW (ref 6–20)
Calcium, Ion: 1.31 mmol/L (ref 1.15–1.40)
Chloride: 107 mmol/L (ref 98–111)
Creatinine, Ser: 1.1 mg/dL — ABNORMAL HIGH (ref 0.44–1.00)
Glucose, Bld: 94 mg/dL (ref 70–99)
HCT: 36 % (ref 36.0–46.0)
Hemoglobin: 12.2 g/dL (ref 12.0–15.0)
Potassium: 4.2 mmol/L (ref 3.5–5.1)
Sodium: 140 mmol/L (ref 135–145)
TCO2: 25 mmol/L (ref 22–32)

## 2022-01-04 SURGERY — REMOVAL, HARDWARE
Anesthesia: General | Site: Foot | Laterality: Left

## 2022-01-04 MED ORDER — FENTANYL CITRATE (PF) 100 MCG/2ML IJ SOLN
INTRAMUSCULAR | Status: AC
  Filled 2022-01-04: qty 2

## 2022-01-04 MED ORDER — OXYCODONE HCL 5 MG PO TABS
ORAL_TABLET | ORAL | Status: AC
  Administered 2022-01-04: 5 mg
  Filled 2022-01-04: qty 1

## 2022-01-04 MED ORDER — MIDAZOLAM HCL 2 MG/2ML IJ SOLN
INTRAMUSCULAR | Status: AC
  Filled 2022-01-04: qty 2

## 2022-01-04 MED ORDER — BUPIVACAINE HCL (PF) 0.5 % IJ SOLN
INTRAMUSCULAR | Status: DC | PRN
  Administered 2022-01-04: 8 mL

## 2022-01-04 MED ORDER — BUPIVACAINE HCL (PF) 0.5 % IJ SOLN
INTRAMUSCULAR | Status: AC
  Filled 2022-01-04: qty 30

## 2022-01-04 MED ORDER — FENTANYL CITRATE (PF) 100 MCG/2ML IJ SOLN: 25.0000 ug | INTRAMUSCULAR | Status: DC | PRN

## 2022-01-04 MED ORDER — 0.9 % SODIUM CHLORIDE (POUR BTL) OPTIME
TOPICAL | Status: DC | PRN
  Administered 2022-01-04: 500 mL

## 2022-01-04 MED ORDER — PROPOFOL 10 MG/ML IV BOLUS
INTRAVENOUS | Status: DC | PRN
  Administered 2022-01-04: 120 mg via INTRAVENOUS
  Administered 2022-01-04: 50 mg via INTRAVENOUS

## 2022-01-04 MED ORDER — LIDOCAINE HCL (CARDIAC) PF 100 MG/5ML IV SOSY
PREFILLED_SYRINGE | INTRAVENOUS | Status: DC | PRN
  Administered 2022-01-04: 60 mg via INTRAVENOUS

## 2022-01-04 MED ORDER — MIDAZOLAM HCL 2 MG/2ML IJ SOLN
INTRAMUSCULAR | Status: DC | PRN
  Administered 2022-01-04: 2 mg via INTRAVENOUS

## 2022-01-04 MED ORDER — FAMOTIDINE 20 MG PO TABS
ORAL_TABLET | ORAL | Status: AC
  Administered 2022-01-04: 20 mg via ORAL
  Filled 2022-01-04: qty 1

## 2022-01-04 MED ORDER — CEFAZOLIN SODIUM-DEXTROSE 2-4 GM/100ML-% IV SOLN
INTRAVENOUS | Status: AC
  Filled 2022-01-04: qty 100

## 2022-01-04 MED ORDER — PHENYLEPHRINE HCL (PRESSORS) 10 MG/ML IV SOLN
INTRAVENOUS | Status: DC | PRN
  Administered 2022-01-04 (×2): 160 ug via INTRAVENOUS

## 2022-01-04 MED ORDER — ONDANSETRON HCL 4 MG/2ML IJ SOLN: 4.0000 mg | Freq: Once | INTRAMUSCULAR | Status: DC | PRN

## 2022-01-04 MED ORDER — ACETAMINOPHEN 10 MG/ML IV SOLN: 1000.0000 mg | Freq: Once | INTRAVENOUS | Status: DC | PRN

## 2022-01-04 MED ORDER — DEXAMETHASONE SODIUM PHOSPHATE 10 MG/ML IJ SOLN
INTRAMUSCULAR | Status: DC | PRN
  Administered 2022-01-04: 6 mg via INTRAVENOUS

## 2022-01-04 MED ORDER — FENTANYL CITRATE (PF) 100 MCG/2ML IJ SOLN
INTRAMUSCULAR | Status: DC | PRN
  Administered 2022-01-04: 25 ug via INTRAVENOUS

## 2022-01-04 MED ORDER — OXYCODONE HCL 5 MG PO TABS
5.0000 mg | ORAL_TABLET | ORAL | 0 refills | Status: DC | PRN
Start: 2022-01-04 — End: 2022-01-17

## 2022-01-04 MED ORDER — CHLORHEXIDINE GLUCONATE 0.12 % MT SOLN
OROMUCOSAL | Status: AC
  Administered 2022-01-04: 15 mL via OROMUCOSAL
  Filled 2022-01-04: qty 15

## 2022-01-04 MED ORDER — ONDANSETRON HCL 4 MG/2ML IJ SOLN
INTRAMUSCULAR | Status: DC | PRN
Start: 2022-01-04 — End: 2022-01-04
  Administered 2022-01-04: 4 mg via INTRAVENOUS

## 2022-01-04 SURGICAL SUPPLY — 50 items
BLADE OSC/SAGITTAL MD 5.5X18 (BLADE) ×1 IMPLANT
BLADE OSC/SAGITTAL MD 9X18.5 (BLADE) ×1 IMPLANT
BLADE SURG 15 STRL LF DISP TIS (BLADE) ×1 IMPLANT
BLADE SURG 15 STRL SS (BLADE) ×2
BLADE SURG MINI STRL (BLADE) IMPLANT
BNDG CMPR STD VLCR NS LF 5.8X4 (GAUZE/BANDAGES/DRESSINGS) ×1
BNDG CMPR STD VLCR NS LF 5.8X6 (GAUZE/BANDAGES/DRESSINGS) ×1
BNDG CONFORM 3 STRL LF (GAUZE/BANDAGES/DRESSINGS) ×2 IMPLANT
BNDG ELASTIC 4X5.8 VLCR NS LF (GAUZE/BANDAGES/DRESSINGS) ×2 IMPLANT
BNDG ELASTIC 6X5.8 VLCR NS LF (GAUZE/BANDAGES/DRESSINGS) ×2 IMPLANT
BNDG ESMARK 4X12 TAN STRL LF (GAUZE/BANDAGES/DRESSINGS) ×2 IMPLANT
CAST PADDING 3X4FT ST 30246 (SOFTGOODS) ×1
COVER PIN YLW 0.028-062 (MISCELLANEOUS) ×2 IMPLANT
CUFF TOURN SGL QUICK 12 (TOURNIQUET CUFF) IMPLANT
CUFF TOURN SGL QUICK 18X4 (TOURNIQUET CUFF) IMPLANT
DRAPE FLUOR MINI C-ARM 54X84 (DRAPES) ×2 IMPLANT
DURAPREP 26ML APPLICATOR (WOUND CARE) ×2 IMPLANT
ELECT REM PT RETURN 9FT ADLT (ELECTROSURGICAL) ×2
ELECTRODE REM PT RTRN 9FT ADLT (ELECTROSURGICAL) ×1 IMPLANT
GAUZE SPONGE 4X4 12PLY STRL (GAUZE/BANDAGES/DRESSINGS) ×2 IMPLANT
GAUZE XEROFORM 1X8 LF (GAUZE/BANDAGES/DRESSINGS) ×2 IMPLANT
GLOVE SURG ENC MOIS LTX SZ7.5 (GLOVE) ×2 IMPLANT
GLOVE SURG UNDER LTX SZ8 (GLOVE) ×2 IMPLANT
GOWN STRL REUS W/ TWL LRG LVL3 (GOWN DISPOSABLE) ×2 IMPLANT
GOWN STRL REUS W/TWL LRG LVL3 (GOWN DISPOSABLE) ×4
KIT TURNOVER KIT A (KITS) ×2 IMPLANT
MANIFOLD NEPTUNE II (INSTRUMENTS) ×2 IMPLANT
NDL FILTER BLUNT 18X1 1/2 (NEEDLE) ×1 IMPLANT
NDL HYPO 25X1 1.5 SAFETY (NEEDLE) ×3 IMPLANT
NEEDLE FILTER BLUNT 18X 1/2SAF (NEEDLE) ×1
NEEDLE FILTER BLUNT 18X1 1/2 (NEEDLE) ×1 IMPLANT
NEEDLE HYPO 25X1 1.5 SAFETY (NEEDLE) ×6 IMPLANT
NS IRRIG 500ML POUR BTL (IV SOLUTION) ×2 IMPLANT
PACK EXTREMITY ARMC (MISCELLANEOUS) ×2 IMPLANT
PAD CAST CTTN 3X4 STRL (SOFTGOODS) ×1 IMPLANT
PADDING CAST COTTON 3X4 STRL (SOFTGOODS) ×1
PENCIL ELECTRO HAND CTR (MISCELLANEOUS) ×2 IMPLANT
RASP SM TEAR CROSS CUT (RASP) ×1 IMPLANT
SOL PREP PVP 2OZ (MISCELLANEOUS) ×2
SOLUTION PREP PVP 2OZ (MISCELLANEOUS) ×1 IMPLANT
STOCKINETTE STRL 6IN 960660 (GAUZE/BANDAGES/DRESSINGS) ×2 IMPLANT
STRAP SAFETY 5IN WIDE (MISCELLANEOUS) ×2 IMPLANT
STRIP CLOSURE SKIN 1/4X4 (GAUZE/BANDAGES/DRESSINGS) ×2 IMPLANT
SUT VIC AB 4-0 FS2 27 (SUTURE) ×2 IMPLANT
SWABSTK COMLB BENZOIN TINCTURE (MISCELLANEOUS) ×2 IMPLANT
SYR 10ML LL (SYRINGE) ×2 IMPLANT
SYR 20ML LL LF (SYRINGE) ×2 IMPLANT
SYR 3ML LL SCALE MARK (SYRINGE) ×2 IMPLANT
WATER STERILE IRR 500ML POUR (IV SOLUTION) ×2 IMPLANT
WIRE Z .045 C-WIRE SPADE TIP (WIRE) ×4 IMPLANT

## 2022-01-04 NOTE — OR Nursing (Signed)
Weight bearing as tolerated, left heel, wearing postop shoe per Dr. Cleda Mccreedy, secure-chat; added to d/c instructions.

## 2022-01-04 NOTE — Anesthesia Preprocedure Evaluation (Signed)
Anesthesia Evaluation  Patient identified by MRN, date of birth, ID band Patient awake    Reviewed: Allergy & Precautions, NPO status , Patient's Chart, lab work & pertinent test results  History of Anesthesia Complications Negative for: history of anesthetic complications  Airway Mallampati: II  TM Distance: >3 FB Neck ROM: Full    Dental  (+) Edentulous Upper, Edentulous Lower   Pulmonary neg sleep apnea, COPD,  COPD inhaler, Patient abstained from smoking.Not current smoker, former smoker,  No home O2. Mets 4+. No need for rescue inhaler  Inspiratory and expiratory wheezes faint  breath sounds clear to auscultation (-) wheezing      Cardiovascular Exercise Tolerance: Good METS(-) hypertension+ CAD  (-) Past MI Normal cardiovascular exam(-) dysrhythmias  Rhythm:Regular Rate:Normal     Neuro/Psych negative neurological ROS  negative psych ROS   GI/Hepatic negative GI ROS, Neg liver ROS, neg GERD  ,  Endo/Other  negative endocrine ROSneg diabetes  Renal/GU negative Renal ROS  negative genitourinary   Musculoskeletal negative musculoskeletal ROS (+)   Abdominal   Peds negative pediatric ROS (+)  Hematology negative hematology ROS (+)   Anesthesia Other Findings Past Medical History: No date: Atypical chest pain     Comment:  a. 2015 Cath ([place]): Nl cors. Nl EF; b. 10/2018 MV:               No ischemia/infarct. EF>65%. 01/2016: Cancer ([place])     Comment:  basal cell  No date: Coronary vasospasm ([place])     Comment:  a. 06/2021 Cath: LM nl, LADnl, LCX large/nl, RCA 35p               (spasm-->tx w/ IC NTG w/ resultant VF req defib). 02/02/2015: Dysplastic nevus     Comment:  R upper back - excision 02/16/2015: Dysplastic nevus     Comment:  L hip - excision 08/24/2015: Dysplastic nevus     Comment:  L upper abdomen 08/11/2017: Dysplastic nevus     Comment:  mid abdomen - excised 07/29/2017: Dysplastic  nevus     Comment:  Left neck No date: Emphysema of lung ([place]) No date: Gastritis No date: Hyperlipidemia 08/24/2015: Melanoma ([place])     Comment:  L buttocks - superficial spreading No date: NICM (nonischemic cardiomyopathy) ([place])     Comment:  a. 06/22/2021 Echo: EF 40%, ant/antseptal/septal HK. Gr1               DD. Nl RV fxn. Mild-mod MR; b. 06/2021 Cath: Nl cors w/               RCA vasospasm (35%). VF w/ IC NTG admin req defib; c.               07/02/2021 Echo: EF 35-40%. Glob HK. Mild MR. No date: Occlusion of right radial artery ([place])     Comment:  a. 06/2021 s/p cardiac cath-->u/s w/ R radial occlusion               from puncture extending up entire forearm-->Lovenox               1mg /kg BID x 4 wks; b. 07/2021 U/S: Thrombus in prox               Radial arteray w/ minimal reconstitution of flow -               improved w/ multiphasic waveforms in R brachial artery. No date: Palpitations     Comment:  a. 07/2018 48h Holter: predominantly RSR, avg rate 106               (69-166 bpm), Rare PAC's and occas PVC's. 2 atrial runs               up to 5 beats. No sustained arrhythmias or prolonged               pauses. No symptoms reported; b. 07/2021 Zio: Avg HR 102               bpm, intermittent bundle branch block, rare PACs and               PVCs, 9 atrial runs, longest 13.7 secs w/ max HR of 226               bpm. No date: Wears dentures     Comment:  full upper and lower  Reproductive/Obstetrics negative OB ROS                             Anesthesia Physical  Anesthesia Plan  ASA: 2  Anesthesia Plan: General   Post-op Pain Management: Ofirmev IV (intra-op)   Induction: Intravenous  PONV Risk Score and Plan: 3 and Ondansetron, Dexamethasone and Midazolam  Airway Management Planned: LMA  Additional Equipment: None  Intra-op Plan:   Post-operative Plan: Extubation in OR  Informed Consent: I have reviewed the patients History and Physical, chart,  labs and discussed the procedure including the risks, benefits and alternatives for the proposed anesthesia with the patient or authorized representative who has indicated his/her understanding and acceptance.     Dental advisory given  Plan Discussed with: CRNA  Anesthesia Plan Comments: (Discussed risks of anesthesia with patient, including PONV, sore throat, lip/dental/eye damage. Rare risks discussed as well, such as cardiorespiratory and neurological sequelae, and allergic reactions. Discussed the role of CRNA in patient's perioperative care. Patient understands.)        Anesthesia Quick Evaluation

## 2022-01-04 NOTE — Discharge Instructions (Addendum)
1.  Elevate the left lower extremity on pillows.  2.  Keep the bandage on the left foot clean, dry, and do not remove.  3.  Sponge bathe only left lower extremity.  4.  Wear surgical shoe on the left foot whenever walking or standing. (Weight bearing as tolerated on your left heel wearing surgical shoe)  5.  Take 1 pain pill, oxycodone, every 4 hours only if needed for pain.  AMBULATORY SURGERY  DISCHARGE INSTRUCTIONS   The drugs that you were given will stay in your system until tomorrow so for the next 24 hours you should not:  Drive an automobile Make any legal decisions Drink any alcoholic beverage   You may resume regular meals tomorrow.  Today it is better to start with liquids and gradually work up to solid foods.  You may eat anything you prefer, but it is better to start with liquids, then soup and crackers, and gradually work up to solid foods.   Please notify your doctor immediately if you have any unusual bleeding, trouble breathing, redness and pain at the surgery site, drainage, fever, or pain not relieved by medication.    Additional Instructions:        Please contact your physician with any problems or Same Day Surgery at [phone], Monday through Friday 6 am to 4 pm, or [place] at [facility] number at [phone].

## 2022-01-04 NOTE — H&P (Signed)
Subjective: Patient presents today for surgery for removal of painful screw in her left foot.  Objective: Neurovascular status intact.  Pain on palpation beneath the third metatarsal phalangeal joint on the left foot.  X-rays reveal retained screw status post osteotomy with some surrounding bone lysis suggestive of motion.  Assessment: Painful screw left foot.  Plan: Medical history and physical in the chart reviewed.  No changes from prior history and physical taken in the office.  Patient stable for planned surgery for removal screw left foot.

## 2022-01-04 NOTE — Interval H&P Note (Signed)
History and Physical Interval Note:  01/04/2022 2:34 PM  Caroline Conley  has presented today for surgery, with the diagnosis of T85.848A - Pain from implamented hardware M79.672 - Left foot pain M77.52 - Casulitis.  The various methods of treatment have been discussed with the patient and family. After consideration of risks, benefits and other options for treatment, the patient has consented to  Procedure(s): HARDWARE REMOVAL (Left) as a surgical intervention.  The patient's history has been reviewed, patient examined, no change in status, stable for surgery.  I have reviewed the patient's chart and labs.  Questions were answered to the patient's satisfaction.     Durward Fortes

## 2022-01-04 NOTE — Op Note (Signed)
Date of operation: 01/04/2022.  Surgeon: Durward Fortes D.P.M.  Preoperative diagnosis: Painful implanted hardware left foot.  Postoperative diagnosis: Same.  Procedure: Removal painful implanted screw left foot.  Anesthesia: LMA with local.  Hemostasis: Esmarch tourniquet left ankle.  Hemostasis: Less than 5 cc.  Complications: None apparent.  Operative indications: This is a 56 year old female with history of surgery several years ago for metatarsal osteotomy.  Now complaining of pain beneath her left forefoot determined to be due to to a screw in the third metatarsal head.  Decision was made for removal of the screw.  Operative procedure: Patient was taken to the operating room and placed on the table in the supine position.  Following satisfactory LMA anesthesia the left forefoot was anesthetized with 8 cc of 0.5% Marcaine plain.  The foot was prepped and draped in the usual sterile fashion.  The left foot was exsanguinated using an Esmarch with the Esmarch left in place around the ankle as a tourniquet.  Attention was directed to the previous incision over the left third metatarsal and an incision was carried through the old incision and deepened down to the level of the capsular tissue.  Linear capsulotomy was performed on the dorsal aspect of the joint and the screw at the third metatarsal head was immediately visualized.  This was removed using a cruciate head screwdriver.  The screw hole was probed using a 22-gauge needle and found to extend all the way through the plantar aspect of the bone.  The wound was then flushed with copious amounts of sterile saline and closed using 4-0 Vicryl running suture for all layers from capsular and periosteal tissue, deep and superficial subcutaneous tissue, followed by skin closure.  Tincture of benzoin and Steri-Strips applied followed by Xeroform 4 x 4 gauze and conformer.  Tourniquet was released and blood flow noted return immediately to the left foot  and all digits.  Kerlix and Ace applied for compression.  Patient was awakened and transported to the PACU having tolerated the anesthesia and procedure well.

## 2022-01-04 NOTE — Anesthesia Procedure Notes (Signed)
Procedure Name: LMA Insertion Date/Time: 01/04/2022 3:33 PM Performed by: Aline Brochure, CRNA Pre-anesthesia Checklist: Patient identified, Patient being monitored, Timeout performed, Emergency Drugs available and Suction available Patient Re-evaluated:Patient Re-evaluated prior to induction Oxygen Delivery Method: Circle system utilized Preoxygenation: Pre-oxygenation with 100% oxygen Induction Type: IV induction Ventilation: Mask ventilation without difficulty LMA: LMA inserted LMA Size: 3.5 Tube type: Oral Number of attempts: 1 Placement Confirmation: positive ETCO2 and breath sounds checked- equal and bilateral Tube secured with: Tape Dental Injury: Teeth and Oropharynx as per pre-operative assessment

## 2022-01-04 NOTE — Transfer of Care (Signed)
Immediate Anesthesia Transfer of Care Note  Patient: Caroline Conley  Procedure(s) Performed: HARDWARE REMOVAL (Left: Foot)  Patient Location: PACU  Anesthesia Type:General  Level of Consciousness: awake  Airway & Oxygen Therapy: Patient Spontanous Breathing and Patient connected to face mask oxygen  Post-op Assessment: Report given to RN and Post -op Vital signs reviewed and stable  Post vital signs: Reviewed and stable  Last Vitals:  Vitals Value Taken Time  BP 133/82 01/04/22 1657  Temp 36.5 C 01/04/22 1657  Pulse 91 01/04/22 1657  Resp 16 01/04/22 1657  SpO2 98 % 01/04/22 1657    Last Pain:  Vitals:   01/04/22 1657  TempSrc: Temporal  PainSc: 0-No pain         Complications: No notable events documented.

## 2022-01-06 NOTE — Anesthesia Postprocedure Evaluation (Signed)
Anesthesia Post Note  Patient: Caroline Conley  Procedure(s) Performed: HARDWARE REMOVAL (Left: Foot)  Patient location during evaluation: PACU Anesthesia Type: General Level of consciousness: awake and alert Pain management: pain level controlled Vital Signs Assessment: post-procedure vital signs reviewed and stable Respiratory status: spontaneous breathing, nonlabored ventilation, respiratory function stable and patient connected to nasal cannula oxygen Cardiovascular status: blood pressure returned to baseline and stable Postop Assessment: no apparent nausea or vomiting Anesthetic complications: no   No notable events documented.   Last Vitals:  Vitals:   01/04/22 1657 01/04/22 1731  BP: 133/82 128/76  Pulse: 91 89  Resp: 16 16  Temp: 36.5 C 36.5 C  SpO2: 98% 99%    Last Pain:  Vitals:   01/04/22 1731  TempSrc: Temporal  PainSc: 0-No pain                 Martha Clan

## 2022-01-07 ENCOUNTER — Encounter: Payer: Self-pay | Admitting: Podiatry

## 2022-01-13 NOTE — Progress Notes (Signed)
Follow-up Outpatient Visit Date: 01/17/2022  Primary Care Provider: Olin Hauser, DO [address] [id number]  Chief Complaint: Follow-up nonischemic cardiomyopathy and right radial artery occlusion  HPI:  Caroline Conley is a 56 y.o. female with history of nonischemic cardiomyopathy and LBBB, palpitations, atypical chest pain with vasospasm on catheterization complicated by iatrogenic ventricular fibrillation with injection of RCA (06/2021) and radial artery occlusion, prior tobacco use, and basal cell carcinoma, who presents for follow-up of nonischemic cardiomyopathy and coronary vasospasm.  She was last seen in our office in 08/2021 by [name], NP.  She had been treated with enoxaparin x 1 month for her radial artery occlusion (though anticoagulation was interrupted for about a week during a trip to [place]); persistent occlusion was observed on Duplex the day before her last visit, though distal radial artery flow appeared better.  Ulnar flow was normal.  She continued to complain of knifelike pain in the right forearm, prompting referral to vascular surgery.  She saw Dr. Donzetta Matters in mid September; intervention was deferred given interval resolution of pain and good hand perfusion.  Today, Caroline Conley reports that she is feeling well.  Her right forearm pain has resolved.  She notes some shortness of breath with activity and at rest, which has been chronic and stable.  She denies chest pain, palpitations, lightheadedness, and edema.  She is tolerating metoprolol succinate 12.5 mg daily well.  She attributes her borderline low blood pressure today as well as mild tachycardia to not having eaten anything all day.  Home blood pressures typically closer to 120/70.  --------------------------------------------------------------------------------------------------  Cardiovascular History & Procedures: Cardiovascular Problems: Tachycardia and palpitations Atypical chest pain Iatrogenic  ventricular fibrillation during catheterization Right radial artery occlusion (following diagnostic catheterization in 06/2021)   Risk Factors: Tobacco use   Cath/PCI: LHC (07/02/2021): Coronary vasospasm with 30 to 40% stenosis of the proximal RCA, which resolved with intracoronary NTG.  Otherwise, no angiographically significant CAD.  Mildly elevated left ventricular filling pressure (LVEDP 15-20 mmHg).  Ventricular fibrillation precipitated with intracoronary NTG administration into the RCA, successfully treated with defibrillation x1. LHC (2015): Report not available.  Images personally reviewed, demonstrating no significant CAD and normal LVEF.   CV Surgery: None   EP Procedures and Devices: 14-day event monitor (03/04/2019): Limited study due to significant artifact.  Usable tracings show predominantly sinus rhythm with rare PAC's and PVC's.  Two brief atrial runs noted (up to 5 beats). 48-hour Holter monitor (08/06/2018): Predominantly sinus rhythm with rare PAC's and PVC's, as well as brief atrial runs.  Average rate during the monitoring period was elevated (106 bpm).   Non-Invasive Evaluation(s): Limited TTE (07/02/2021): Normal LV size and wall thickness.  LVEF 35-40% with global hypokinesis.  Normal RV size and function.  Mild MR.  No significant change from prior echo on 06/22/2021. Pharmacologic MPI (06/25/2021): Moderate size, mild in severity, mid anterior and apical reversible defect consistent with possible LAD territory ischemia.  LVEF 55-65%.  Intermediate risk study. TTE (06/22/2021): Normal LV size and wall thickness.  LVEF 40% with grade 1 diastolic dysfunction and global longitudinal strain of -13.7%.  Global hypokinesis noted.  Normal RV size and function.  Normal biatrial size.  Mild to moderate mitral regurgitation. TTE (08/04/2018): Normal LV size and wall thickness.  LVEF 60-65% with normal wall motion.  Grade 1 diastolic dysfunction.  Normal RV size and function.  No  significant valvular abnormality.  Recent CV Pertinent Labs: Lab Results  Component Value Date  CHOL 169 07/29/2017   HDL 35 (L) 07/29/2017   LDLCALC 85 07/29/2017   TRIG 243 (H) 07/29/2017   CHOLHDL 4.8 07/29/2017   INR 1.0 07/12/2021   K 4.2 01/04/2022   K 3.6 09/23/2014   MG 1.8 12/31/2018   BUN 4 (L) 01/04/2022   BUN 6 07/12/2021   BUN 5 (L) 09/23/2014   CREATININE 1.10 (H) 01/04/2022   CREATININE 1.16 (H) 12/31/2018    Past medical and surgical history were reviewed and updated in EPIC.  Current Meds  Medication Sig   albuterol (PROVENTIL) (2.5 MG/3ML) 0.083% nebulizer solution Take 2.5 mg by nebulization every 6 (six) hours as needed for shortness of breath or wheezing.   fluconazole (DIFLUCAN) 150 MG tablet Take 1 tablet by mouth on day 1. Repeat dose 2nd tab on day 3.   magic mouthwash w/lidocaine SOLN Swish, gargle, and spit one to two teaspoonfuls every six hours as needed. Shake well before using.   metoprolol succinate (TOPROL-XL) 25 MG 24 hr tablet Take 12.5 mg by mouth daily.   Misc. Devices (PULSE OXIMETER FOR FINGER) MISC 1 Device by Does not apply route as needed (for cough, dyspnea). Pulse oximeter for finger for checking oxygen saturation in COPD   oxyCODONE-acetaminophen (PERCOCET) 10-325 MG tablet Take 1 tablet by mouth every 4 (four) hours.   PROAIR HFA 108 (90 Base) MCG/ACT inhaler Inhale 2 puffs into the lungs every 4 (four) hours as needed for wheezing or shortness of breath.   roflumilast (DALIRESP) 500 MCG TABS tablet Take 1 tablet (500 mcg total) by mouth daily.   TRELEGY ELLIPTA 100-62.5-25 MCG/INH AEPB INHALE 1 PUFF BY MOUTH ONCE DAILY    Allergies: 2,4-d dimethylamine (amisol); Morphine and related; and Vicodin [hydrocodone-acetaminophen]  Social History   Tobacco Use   Smoking status: Former    Packs/day: 1.00    Years: 30.00    Pack years: 30.00    Types: Cigarettes    Quit date: 03/02/2018    Years since quitting: 3.8   Smokeless  tobacco: Never   Tobacco comments:    0.5 to 1 ppd >30 years  Vaping Use   Vaping Use: Never used  Substance Use Topics   Alcohol use: No   Drug use: Yes    Types: Oxycodone    Family History  Problem Relation Age of Onset   Diabetes Mother    High blood pressure Mother    Gout Mother    Cancer Father    Stroke Father    Arthritis/Rheumatoid Father    Diabetes Father    High blood pressure Father    Heart failure Father    Heart failure Brother    Diabetes Brother     Review of Systems: A 12-system review of systems was performed and was negative except as noted in the HPI.  --------------------------------------------------------------------------------------------------  Physical Exam: BP 100/64 (BP Location: Left Arm, Patient Position: Sitting, Cuff Size: Normal)    Pulse (!) 107    Ht 5\' 6"  (1.676 m)    Wt 128 lb (58.1 kg)    SpO2 96%    BMI 20.66 kg/m  Repeat BP: 104/70  General:  NAD. Neck: No JVD or HJR. Lungs: Clear to auscultation bilaterally without wheezes or crackles. Heart: Tachycardic but regular without murmurs, rubs, or gallops. Abdomen: Soft, nontender, nondistended. Extremities: No lower extremity edema.  Trace right radial pulse.  2+ left radial pulse.  EKG: Sinus tachycardia with incomplete left bundle branch block.  QRS slightly more  narrow compared to 01/03/2022.  Otherwise, no significant interval change.  Lab Results  Component Value Date   WBC 7.7 01/03/2022   HGB 12.2 01/04/2022   HCT 36.0 01/04/2022   MCV 98.9 01/03/2022   PLT 314 01/03/2022    Lab Results  Component Value Date   NA 140 01/04/2022   K 4.2 01/04/2022   CL 107 01/04/2022   CO2 24 01/03/2022   BUN 4 (L) 01/04/2022   CREATININE 1.10 (H) 01/04/2022   GLUCOSE 94 01/04/2022   ALT 11 07/12/2021    Lab Results  Component Value Date   CHOL 169 07/29/2017   HDL 35 (L) 07/29/2017   LDLCALC 85 07/29/2017   TRIG 243 (H) 07/29/2017   CHOLHDL 4.8 07/29/2017     --------------------------------------------------------------------------------------------------  ASSESSMENT AND PLAN: Chronic HFrEF due to nonischemic cardiomyopathy: Caroline Conley appears euvolemic with stable NYHA class II symptoms.  I suspect much of her shortness of breath is noncardiac in nature.  EKG today is stable other than slight narrowing of her QRS complex compared with prior tracings.  She still has a left bundle morphology.  We will continue metoprolol succinate 12.5 mg daily though if her blood pressure is consistently above 833 systolic, I think it would be reasonable for Caroline Conley to increase her dosing to 25 mg daily.  I am reluctant to challenge her with an ACE inhibitor/ARB in the setting of her soft blood pressure.  We will plan to repeat an echocardiogram before our follow-up visit in 3 months to reassess her LVEF.  Right radial artery occlusion: Pain has resolved.  Hand appears well perfused.  Patient evaluated by vascular surgery without plans for any further intervention.  Follow-up: Return to clinic in 3 months.  [name], MD 01/17/2022 4:27 PM

## 2022-01-17 ENCOUNTER — Encounter: Payer: Self-pay | Admitting: Internal Medicine

## 2022-01-17 ENCOUNTER — Other Ambulatory Visit: Payer: Self-pay

## 2022-01-17 ENCOUNTER — Ambulatory Visit (INDEPENDENT_AMBULATORY_CARE_PROVIDER_SITE_OTHER): Payer: 59 | Admitting: Internal Medicine

## 2022-01-17 VITALS — BP 100/64 | HR 107 | Ht 66.0 in | Wt 128.0 lb

## 2022-01-17 DIAGNOSIS — I428 Other cardiomyopathies: Secondary | ICD-10-CM

## 2022-01-17 DIAGNOSIS — I70208 Unspecified atherosclerosis of native arteries of extremities, other extremity: Secondary | ICD-10-CM

## 2022-01-17 DIAGNOSIS — I5022 Chronic systolic (congestive) heart failure: Secondary | ICD-10-CM | POA: Diagnosis not present

## 2022-01-17 DIAGNOSIS — I447 Left bundle-branch block, unspecified: Secondary | ICD-10-CM

## 2022-01-17 MED ORDER — METOPROLOL SUCCINATE ER 25 MG PO TB24: 12.5000 mg | ORAL_TABLET | Freq: Every day | ORAL | 3 refills | Status: DC

## 2022-01-17 NOTE — Patient Instructions (Signed)
Medication Instructions:   Your physician recommends that you continue on your current medications as directed. Please refer to the Current Medication list given to you today.  1) Check your blood pressure at home.  If your BP is 120/60 or greater, INCREASE Metoprolol Succinate to 25 mg daily.  2) Increase your water intake  3) Make sure you do not skip meals  *If you need a refill on your cardiac medications before your next appointment, please call your pharmacy*   Lab Work:  None ordered  Testing/Procedures:  Your physician has requested that you have an echocardiogram. Echocardiography is a painless test that uses sound waves to create images of your heart. It provides your doctor with information about the size and shape of your heart and how well your hearts chambers and valves are working. This procedure takes approximately one hour. There are no restrictions for this procedure.   Follow-Up: At [facility], you and your health needs are our priority.  As part of our continuing mission to provide you with exceptional heart care, we have created designated Provider Care Teams.  These Care Teams include your primary Cardiologist (physician) and Advanced Practice Providers (APPs -  Physician Assistants and Nurse Practitioners) who all work together to provide you with the care you need, when you need it.  We recommend signing up for the patient portal called "MyChart".  Sign up information is provided on this After Visit Summary.  MyChart is used to connect with patients for Virtual Visits (Telemedicine).  Patients are able to view lab/test results, encounter notes, upcoming appointments, etc.  Non-urgent messages can be sent to your provider as well.   To learn more about what you can do with MyChart, go to [email or web address].    Your next appointment:   3 month(s)  The format for your next appointment:   In Person  Provider:   You may see [name], MD or one  of the following Advanced Practice Providers on your designated Care Team:   Murray Hodgkins, NP Christell Faith, PA-C Cadence Kathlen Mody, [place]

## 2022-01-18 ENCOUNTER — Other Ambulatory Visit: Payer: Self-pay

## 2022-01-18 ENCOUNTER — Emergency Department: Payer: 59

## 2022-01-18 ENCOUNTER — Ambulatory Visit: Payer: Self-pay | Admitting: *Deleted

## 2022-01-18 ENCOUNTER — Encounter: Payer: Self-pay | Admitting: Emergency Medicine

## 2022-01-18 ENCOUNTER — Emergency Department
Admission: EM | Admit: 2022-01-18 | Discharge: 2022-01-18 | Disposition: A | Discharge status: Home / Self Care | Payer: 59 | Attending: Emergency Medicine | Admitting: Emergency Medicine

## 2022-01-18 DIAGNOSIS — D72829 Elevated white blood cell count, unspecified: Secondary | ICD-10-CM | POA: Diagnosis not present

## 2022-01-18 DIAGNOSIS — L299 Pruritus, unspecified: Secondary | ICD-10-CM | POA: Diagnosis present

## 2022-01-18 LAB — URINALYSIS, ROUTINE W REFLEX MICROSCOPIC
Bilirubin Urine: NEGATIVE
Glucose, UA: NEGATIVE mg/dL
Ketones, ur: NEGATIVE mg/dL
Leukocytes,Ua: NEGATIVE
Nitrite: NEGATIVE
Protein, ur: NEGATIVE mg/dL
Specific Gravity, Urine: 1.01 (ref 1.005–1.030)
pH: 5.5 (ref 5.0–8.0)

## 2022-01-18 LAB — BASIC METABOLIC PANEL
Anion gap: 12 (ref 5–15)
BUN: 9 mg/dL (ref 6–20)
CO2: 25 mmol/L (ref 22–32)
Calcium: 9.7 mg/dL (ref 8.9–10.3)
Chloride: 97 mmol/L — ABNORMAL LOW (ref 98–111)
Creatinine, Ser: 1.18 mg/dL — ABNORMAL HIGH (ref 0.44–1.00)
GFR, Estimated: 54 mL/min — ABNORMAL LOW (ref >60)
Glucose, Bld: 101 mg/dL — ABNORMAL HIGH (ref 70–99)
Potassium: 3.6 mmol/L (ref 3.5–5.1)
Sodium: 134 mmol/L — ABNORMAL LOW (ref 135–145)

## 2022-01-18 LAB — CBC
HCT: 38.3 % (ref 36.0–46.0)
Hemoglobin: 13.4 g/dL (ref 12.0–15.0)
MCH: 34.5 pg — ABNORMAL HIGH (ref 26.0–34.0)
MCHC: 35 g/dL (ref 30.0–36.0)
MCV: 98.7 fL (ref 80.0–100.0)
Platelets: 349 10*3/uL (ref 150–400)
RBC: 3.88 MIL/uL (ref 3.87–5.11)
RDW: 12.5 % (ref 11.5–15.5)
WBC: 21.7 10*3/uL — ABNORMAL HIGH (ref 4.0–10.5)
nRBC: 0 % (ref 0.0–0.2)

## 2022-01-18 LAB — CBC WITH DIFFERENTIAL/PLATELET
Abs Immature Granulocytes: 0.17 10*3/uL — ABNORMAL HIGH (ref 0.00–0.07)
Basophils Absolute: 0.1 10*3/uL (ref 0.0–0.1)
Basophils Relative: 0 %
Eosinophils Absolute: 0.1 10*3/uL (ref 0.0–0.5)
Eosinophils Relative: 0 %
HCT: 38.3 % (ref 36.0–46.0)
Hemoglobin: 13.4 g/dL (ref 12.0–15.0)
Immature Granulocytes: 1 %
Lymphocytes Relative: 7 %
Lymphs Abs: 1.6 10*3/uL (ref 0.7–4.0)
MCH: 34.6 pg — ABNORMAL HIGH (ref 26.0–34.0)
MCHC: 35 g/dL (ref 30.0–36.0)
MCV: 99 fL (ref 80.0–100.0)
Monocytes Absolute: 0.6 10*3/uL (ref 0.1–1.0)
Monocytes Relative: 3 %
Neutro Abs: 19.4 10*3/uL — ABNORMAL HIGH (ref 1.7–7.7)
Neutrophils Relative %: 89 %
Platelets: 374 10*3/uL (ref 150–400)
RBC: 3.87 MIL/uL (ref 3.87–5.11)
RDW: 12.6 % (ref 11.5–15.5)
WBC: 21.9 10*3/uL — ABNORMAL HIGH (ref 4.0–10.5)
nRBC: 0 % (ref 0.0–0.2)

## 2022-01-18 LAB — HEPATIC FUNCTION PANEL
ALT: 12 U/L (ref 0–44)
AST: 19 U/L (ref 15–41)
Albumin: 3.6 g/dL (ref 3.5–5.0)
Alkaline Phosphatase: 87 U/L (ref 38–126)
Bilirubin, Direct: <0.1 mg/dL (ref 0.0–0.2)
Total Bilirubin: 0.3 mg/dL (ref 0.3–1.2)
Total Protein: 6.8 g/dL (ref 6.5–8.1)

## 2022-01-18 LAB — TROPONIN I (HIGH SENSITIVITY)
Troponin I (High Sensitivity): 8 ng/L (ref <18)
Troponin I (High Sensitivity): 10 ng/L (ref <18)

## 2022-01-18 LAB — URINALYSIS, MICROSCOPIC (REFLEX): Bacteria, UA: NONE SEEN

## 2022-01-18 MED ORDER — DIPHENHYDRAMINE HCL 50 MG/ML IJ SOLN: 50.0000 mg | Freq: Once | INTRAMUSCULAR | Status: AC

## 2022-01-18 MED ORDER — FAMOTIDINE IN NACL 20-0.9 MG/50ML-% IV SOLN
20.0000 mg | Freq: Once | INTRAVENOUS | Status: AC
  Administered 2022-01-18: 20 mg via INTRAVENOUS
  Filled 2022-01-18: qty 50

## 2022-01-18 MED ORDER — PREDNISONE 20 MG PO TABS
20.0000 mg | ORAL_TABLET | Freq: Every day | ORAL | 0 refills | Status: DC
Start: 2022-01-18 — End: 2022-01-22

## 2022-01-18 MED ORDER — DIPHENHYDRAMINE HCL 50 MG/ML IJ SOLN
INTRAMUSCULAR | Status: AC
  Administered 2022-01-18: 50 mg via INTRAMUSCULAR
  Filled 2022-01-18: qty 1

## 2022-01-18 MED ORDER — CETIRIZINE HCL 10 MG PO TABS: 10.0000 mg | ORAL_TABLET | Freq: Every day | ORAL | 0 refills | Status: DC

## 2022-01-18 MED ORDER — CEPHALEXIN 500 MG PO CAPS: 500.0000 mg | ORAL_CAPSULE | Freq: Two times a day (BID) | ORAL | 0 refills | Status: DC

## 2022-01-18 MED ORDER — METHYLPREDNISOLONE SODIUM SUCC 125 MG IJ SOLR
125.0000 mg | Freq: Once | INTRAMUSCULAR | Status: AC
  Administered 2022-01-18: 125 mg via INTRAVENOUS
  Filled 2022-01-18: qty 2

## 2022-01-18 NOTE — Discharge Instructions (Addendum)
Can take Allegra or Zyrtec during the day to help with the itching and Benadryl 50 mg at nighttime.  SHe can use the steroids if the itching is continuing.  Your white count was elevated which is a marker of infection although you denied any other symptoms I do not see any signs of any fevers or infection however if you develop any redness over your area of surgery or fevers you need to return the ER immediately. In the meantime I have started some antibiotics in case there is an infection starting from your surgery.  SHe should follow-up for repeat evaluation with your primary care doctor next week

## 2022-01-18 NOTE — ED Provider Notes (Signed)
[facility] Provider Note    Event Date/Time   First MD Initiated Contact with Patient 01/18/22 1207     (approximate)   History   Chest Pain and Allergic Reaction   HPI  AANVI VOYLES is a 56 y.o. female with nonischemic cardiomyopathy, hyperlipidemia who comes in with concerns for allergic reaction.  Patient denies ever having a heart attack before.  She does not have an ICD.  She reports that she had some of her acid reflux but she took Tums and her symptoms are now gone.  She reports some symptoms of acid reflux around 6 AM.  She also reports having some itchiness going on for the past 2 or 3 days.  Got Benadryl in triage with some relief in symptoms but still itching.  Reports this happened once 8 years ago and that she got a shot and that it all went away.  She has a little bit of welts on her back.  Denies any known changes to detergents or what she would have been reacting to. Denies being out in the forest. No lesions in mouth of vaginal area. No fevers.   Physical Exam   Triage Vital Signs: ED Triage Vitals  Enc Vitals Group     BP 01/18/22 1119 119/89     Pulse Rate 01/18/22 1119 96     Resp 01/18/22 1119 18     Temp 01/18/22 1119 98.8 F (37.1 C)     Temp Source 01/18/22 1119 Oral     SpO2 01/18/22 1119 96 %     Weight 01/18/22 1109 128 lb 1.4 oz (58.1 kg)     Height 01/18/22 1109 5\' 6"  (1.676 m)     Head Circumference --      Peak Flow --      Pain Score 01/18/22 1109 6     Pain Loc --      Pain Edu? --      Excl. in [place]? --     Most recent vital signs: Vitals:   01/18/22 1119  BP: 119/89  Pulse: 96  Resp: 18  Temp: 98.8 F (37.1 C)  SpO2: 96%     General: Awake, no distress.  Speaking in full sentences.  Itching CV:  Good peripheral perfusion.  Resp:  Normal effort.  Abd:  No distention.  Soft nontender Other:  No obvious rash noted.  A little bit of redness where patient had been itching at.  No lesions in her  mouth.    ED Results / Procedures / Treatments   Labs (all labs ordered are listed, but only abnormal results are displayed) Labs Reviewed  CBC - Abnormal; Notable for the following components:      Result Value   WBC 21.7 (*)    MCH 34.5 (*)    All other components within normal limits  BASIC METABOLIC PANEL  TROPONIN I (HIGH SENSITIVITY)     EKG  My interpretation of EKG:  Normal sinus rhythm 96 without any ST elevation, no T wave inversions, left bundle branch block  RADIOLOGY I have reviewed the xray personally and agree with radiology read negative.   PROCEDURES:  Critical Care performed: No  Procedures   MEDICATIONS ORDERED IN ED: Medications  diphenhydrAMINE (BENADRYL) injection 50 mg (50 mg Intramuscular Given 01/18/22 1137)  famotidine (PEPCID) IVPB 20 mg premix (0 mg Intravenous Stopped 01/18/22 1328)  methylPREDNISolone sodium succinate (SOLU-MEDROL) 125 mg/2 mL injection 125 mg (125 mg Intravenous Given 01/18/22  1230)     IMPRESSION / MDM / ASSESSMENT AND PLAN / ED COURSE  I reviewed the triage vital signs and the nursing notes.   Differential diagnosis includes, but is not limited to, allergic reaction.  Unclear what is the cause of it.  No obvious hives on examination just redness from where she was itching.  We will also test her liver to make sure is no issues with her liver.  He reports that this is happened once previously.  The chest pain seems to be more likely related to her acid reflux but cardiac markers were ordered to make symptoms of ACS and EKG was ordered.  She got a dose of IM Benadryl out from but was still having some itching.  Patient was given some IV famotidine, IV steroids.  Troponins are negative x2 UA was negative Kidney function around her baseline at 1.18 CBC shows elevated white count with a slight left shift.  I discussed the patient her abnormal white count.  She does have some neutrophils.  However she is afebrile which I  have checked again confirmed.  She did have recent surgery on her left foot I examined the area and there is no evidence of postop infection.  Her abdomen is soft and nontender.  She is not having any burning when she pees or coughing to suggest pneumonia.  She otherwise feels her normal self.  This could be reactive from the recent surgery however given how high it is with a left shift I think it be reasonable to start on a course of antibiotics to help prevent any worsening infection if there could be a infection starting from where her surgery was.  She expressed understanding and will continue to monitor this area.  I considered admission for patient given her significantly elevated white count but given she is otherwise well-appearing with no other evidence of bacteremia or sepsis I think it be reasonable for patient to go home.  On repeat evaluation patient reports resolution of her itching.  We will give her a short course of steroids and recommended some Zyrtec or Benadryl over-the-counter.  She expressed understanding felt comfortable with this plan  I discussed the provisional nature of ED diagnosis, the treatment so far, the ongoing plan of care, follow up appointments and return precautions with the patient and any family or support people present. They expressed understanding and agreed with the plan, discharged home.      FINAL CLINICAL IMPRESSION(S) / ED DIAGNOSES   Final diagnoses:  Itching  Leukocytosis, unspecified type     Rx / DC Orders   ED Discharge Orders          Ordered    predniSONE (DELTASONE) 20 MG tablet  Daily with breakfast        01/18/22 1515    cephALEXin (KEFLEX) 500 MG capsule  2 times daily        01/18/22 1517             Note:  This document was prepared using Dragon voice recognition software and may include unintentional dictation errors.   Vanessa [place], MD 01/18/22 [phone]

## 2022-01-18 NOTE — Telephone Encounter (Signed)
°  Chief Complaint: itching- reaction Symptoms: all over itching Frequency: 3 days Pertinent Negatives: Patient denies rash Disposition: [] ED /[x] Urgent Care (no appt availability in office) / [] Appointment(In office/virtual)/ []  [facility]/ [] Home Care/ [] Refused Recommended Disposition /[] [facility]/ []  Follow-up with PCP Additional Notes:

## 2022-01-18 NOTE — Telephone Encounter (Signed)
Reason for Disposition  [1] MODERATE-SEVERE widespread itching (i.e., interferes with sleep, normal activities or school) AND [2] not improved after 24 hours of itching Care Advice  Answer Assessment - Initial Assessment Questions 1. DESCRIPTION: "Describe the itching you are having."     Itching area moves 2. SEVERITY: "How bad is it?"    - MILD - doesn't interfere with normal activities   - MODERATE-SEVERE: interferes with work, school, sleep, or other activities      Moderate/severe 3. SCRATCHING: "Are there any scratch marks? Bleeding?"     yes 4. ONSET: "When did this begin?"      3 days 5. CAUSE: "What do you think is causing the itching?" (ask about swimming pools, pollen, animals, soaps, etc.)     Pool last week 6. OTHER SYMPTOMS: "Do you have any other symptoms?"      no 7. PREGNANCY: "Is there any chance you are pregnant?" "When was your last menstrual period?"     *No Answer*  Protocols used: Itching - [facility]

## 2022-01-18 NOTE — ED Triage Notes (Signed)
Pt comes into the ED via POV c/o rash/allergic reaction that started 3 days ago.  Pt denies any new lotions, detergents, soaps, etc.  Pt scratching her arms in triage.  Pt also complaining of chest pain that radiates into the right side of her neck.  Pt states it is a burning sensation and she believes it is reflux.  Pt does have a cardiac history.

## 2022-01-19 ENCOUNTER — Encounter: Payer: Self-pay | Admitting: Internal Medicine

## 2022-01-20 ENCOUNTER — Other Ambulatory Visit: Payer: Self-pay

## 2022-01-20 ENCOUNTER — Emergency Department: Payer: 59

## 2022-01-20 ENCOUNTER — Observation Stay
Admission: EM | Admit: 2022-01-20 | Discharge: 2022-01-21 | Disposition: A | Discharge status: Home / Self Care | Payer: 59 | Attending: Obstetrics and Gynecology | Admitting: Obstetrics and Gynecology

## 2022-01-20 DIAGNOSIS — R519 Headache, unspecified: Secondary | ICD-10-CM | POA: Diagnosis not present

## 2022-01-20 DIAGNOSIS — J441 Chronic obstructive pulmonary disease with (acute) exacerbation: Secondary | ICD-10-CM | POA: Diagnosis not present

## 2022-01-20 DIAGNOSIS — K2900 Acute gastritis without bleeding: Secondary | ICD-10-CM | POA: Diagnosis not present

## 2022-01-20 DIAGNOSIS — I428 Other cardiomyopathies: Secondary | ICD-10-CM | POA: Insufficient documentation

## 2022-01-20 DIAGNOSIS — J449 Chronic obstructive pulmonary disease, unspecified: Secondary | ICD-10-CM | POA: Diagnosis present

## 2022-01-20 DIAGNOSIS — I447 Left bundle-branch block, unspecified: Secondary | ICD-10-CM | POA: Insufficient documentation

## 2022-01-20 DIAGNOSIS — J189 Pneumonia, unspecified organism: Principal | ICD-10-CM | POA: Insufficient documentation

## 2022-01-20 DIAGNOSIS — E876 Hypokalemia: Secondary | ICD-10-CM | POA: Insufficient documentation

## 2022-01-20 DIAGNOSIS — A419 Sepsis, unspecified organism: Secondary | ICD-10-CM | POA: Insufficient documentation

## 2022-01-20 DIAGNOSIS — K219 Gastro-esophageal reflux disease without esophagitis: Secondary | ICD-10-CM | POA: Insufficient documentation

## 2022-01-20 DIAGNOSIS — Z20822 Contact with and (suspected) exposure to covid-19: Secondary | ICD-10-CM | POA: Diagnosis not present

## 2022-01-20 DIAGNOSIS — R079 Chest pain, unspecified: Secondary | ICD-10-CM | POA: Diagnosis present

## 2022-01-20 LAB — CBC WITH DIFFERENTIAL/PLATELET
Abs Immature Granulocytes: 0.27 10*3/uL — ABNORMAL HIGH (ref 0.00–0.07)
Basophils Absolute: 0 10*3/uL (ref 0.0–0.1)
Basophils Relative: 0 %
Eosinophils Absolute: 0.1 10*3/uL (ref 0.0–0.5)
Eosinophils Relative: 0 %
HCT: 38.5 % (ref 36.0–46.0)
Hemoglobin: 13.3 g/dL (ref 12.0–15.0)
Immature Granulocytes: 1 %
Lymphocytes Relative: 4 %
Lymphs Abs: 0.9 10*3/uL (ref 0.7–4.0)
MCH: 34.3 pg — ABNORMAL HIGH (ref 26.0–34.0)
MCHC: 34.5 g/dL (ref 30.0–36.0)
MCV: 99.2 fL (ref 80.0–100.0)
Monocytes Absolute: 1.4 10*3/uL — ABNORMAL HIGH (ref 0.1–1.0)
Monocytes Relative: 7 %
Neutro Abs: 18.6 10*3/uL — ABNORMAL HIGH (ref 1.7–7.7)
Neutrophils Relative %: 88 %
Platelets: 326 10*3/uL (ref 150–400)
RBC: 3.88 MIL/uL (ref 3.87–5.11)
RDW: 12.9 % (ref 11.5–15.5)
WBC: 21.3 10*3/uL — ABNORMAL HIGH (ref 4.0–10.5)
nRBC: 0 % (ref 0.0–0.2)

## 2022-01-20 LAB — URINALYSIS, ROUTINE W REFLEX MICROSCOPIC
Bacteria, UA: NONE SEEN
Bilirubin Urine: NEGATIVE
Glucose, UA: NEGATIVE mg/dL
Ketones, ur: NEGATIVE mg/dL
Leukocytes,Ua: NEGATIVE
Nitrite: NEGATIVE
Protein, ur: NEGATIVE mg/dL
Specific Gravity, Urine: 1.01 (ref 1.005–1.030)
pH: 7 (ref 5.0–8.0)

## 2022-01-20 LAB — COMPREHENSIVE METABOLIC PANEL
ALT: 10 U/L (ref 0–44)
AST: 23 U/L (ref 15–41)
Albumin: 3.5 g/dL (ref 3.5–5.0)
Alkaline Phosphatase: 81 U/L (ref 38–126)
Anion gap: 8 (ref 5–15)
BUN: 9 mg/dL (ref 6–20)
CO2: 26 mmol/L (ref 22–32)
Calcium: 9.1 mg/dL (ref 8.9–10.3)
Chloride: 104 mmol/L (ref 98–111)
Creatinine, Ser: 1.16 mg/dL — ABNORMAL HIGH (ref 0.44–1.00)
GFR, Estimated: 55 mL/min — ABNORMAL LOW (ref >60)
Glucose, Bld: 103 mg/dL — ABNORMAL HIGH (ref 70–99)
Potassium: 2.8 mmol/L — ABNORMAL LOW (ref 3.5–5.1)
Sodium: 138 mmol/L (ref 135–145)
Total Bilirubin: 0.4 mg/dL (ref 0.3–1.2)
Total Protein: 6.6 g/dL (ref 6.5–8.1)

## 2022-01-20 LAB — LACTIC ACID, PLASMA
Lactic Acid, Venous: 2.5 mmol/L (ref 0.5–1.9)
Lactic Acid, Venous: 3.1 mmol/L (ref 0.5–1.9)
Lactic Acid, Venous: 2.5 mmol/L (ref 0.5–1.9)
Lactic Acid, Venous: 1.8 mmol/L (ref 0.5–1.9)

## 2022-01-20 LAB — RESP PANEL BY RT-PCR (FLU A&B, COVID) ARPGX2
Influenza A by PCR: NEGATIVE
Influenza B by PCR: NEGATIVE
SARS Coronavirus 2 by RT PCR: NEGATIVE

## 2022-01-20 LAB — TROPONIN I (HIGH SENSITIVITY)
Troponin I (High Sensitivity): 11 ng/L (ref <18)
Troponin I (High Sensitivity): 11 ng/L (ref <18)

## 2022-01-20 LAB — PROCALCITONIN: Procalcitonin: <0.1 ng/mL

## 2022-01-20 LAB — MAGNESIUM: Magnesium: 1.8 mg/dL (ref 1.7–2.4)

## 2022-01-20 MED ORDER — SODIUM CHLORIDE 0.9 % IV SOLN
12.5000 mg | Freq: Once | INTRAVENOUS | Status: AC
  Administered 2022-01-20: 12.5 mg via INTRAVENOUS
  Filled 2022-01-20: qty 0.5

## 2022-01-20 MED ORDER — ENOXAPARIN SODIUM 40 MG/0.4ML IJ SOSY
40.0000 mg | PREFILLED_SYRINGE | INTRAMUSCULAR | Status: DC
  Administered 2022-01-20: 40 mg via SUBCUTANEOUS
  Filled 2022-01-20: qty 0.4

## 2022-01-20 MED ORDER — IPRATROPIUM-ALBUTEROL 0.5-2.5 (3) MG/3ML IN SOLN
3.0000 mL | Freq: Four times a day (QID) | RESPIRATORY_TRACT | Status: DC | PRN
  Administered 2022-01-21: 3 mL via RESPIRATORY_TRACT

## 2022-01-20 MED ORDER — METOCLOPRAMIDE HCL 5 MG/ML IJ SOLN
10.0000 mg | Freq: Once | INTRAMUSCULAR | Status: AC
Start: 2022-01-20 — End: 2022-01-20
  Administered 2022-01-20: 10 mg via INTRAVENOUS
  Filled 2022-01-20: qty 2

## 2022-01-20 MED ORDER — IOHEXOL 350 MG/ML SOLN
100.0000 mL | Freq: Once | INTRAVENOUS | Status: AC | PRN
  Administered 2022-01-20: 100 mL via INTRAVENOUS

## 2022-01-20 MED ORDER — MAGNESIUM SULFATE 2 GM/50ML IV SOLN
2.0000 g | Freq: Once | INTRAVENOUS | Status: AC
  Administered 2022-01-20: 2 g via INTRAVENOUS
  Filled 2022-01-20: qty 50

## 2022-01-20 MED ORDER — SODIUM CHLORIDE 0.9 % IV SOLN: 2.0000 g | INTRAVENOUS | Status: DC

## 2022-01-20 MED ORDER — SODIUM CHLORIDE 0.9 % IV SOLN
500.0000 mg | INTRAVENOUS | Status: DC
  Administered 2022-01-20: 500 mg via INTRAVENOUS
  Filled 2022-01-20 (×2): qty 5

## 2022-01-20 MED ORDER — ONDANSETRON HCL 4 MG/2ML IJ SOLN
4.0000 mg | Freq: Once | INTRAMUSCULAR | Status: AC
  Administered 2022-01-20: 4 mg via INTRAVENOUS
  Filled 2022-01-20: qty 2

## 2022-01-20 MED ORDER — POTASSIUM CHLORIDE 2 MEQ/ML IV SOLN
INTRAVENOUS | Status: AC
  Filled 2022-01-20: qty 1000

## 2022-01-20 MED ORDER — ONDANSETRON HCL 4 MG/2ML IJ SOLN: 4.0000 mg | Freq: Four times a day (QID) | INTRAMUSCULAR | Status: DC | PRN

## 2022-01-20 MED ORDER — PANTOPRAZOLE SODIUM 40 MG IV SOLR
40.0000 mg | Freq: Once | INTRAVENOUS | Status: AC
  Administered 2022-01-20: 40 mg via INTRAVENOUS
  Filled 2022-01-20: qty 40

## 2022-01-20 MED ORDER — PANTOPRAZOLE SODIUM 40 MG IV SOLR
40.0000 mg | Freq: Every day | INTRAVENOUS | Status: DC
  Administered 2022-01-21: 40 mg via INTRAVENOUS
  Filled 2022-01-20: qty 40

## 2022-01-20 MED ORDER — METOPROLOL SUCCINATE ER 25 MG PO TB24
12.5000 mg | ORAL_TABLET | Freq: Every day | ORAL | Status: DC
  Administered 2022-01-20 – 2022-01-21 (×2): 12.5 mg via ORAL
  Filled 2022-01-20: qty 1
  Filled 2022-01-20: qty 0.5
  Filled 2022-01-20: qty 1

## 2022-01-20 MED ORDER — SODIUM CHLORIDE 0.9 % IV SOLN
12.5000 mg | Freq: Four times a day (QID) | INTRAVENOUS | Status: DC | PRN
  Filled 2022-01-20 (×2): qty 0.5

## 2022-01-20 MED ORDER — ROFLUMILAST 500 MCG PO TABS
500.0000 ug | ORAL_TABLET | Freq: Every day | ORAL | Status: DC
  Administered 2022-01-21: 500 ug via ORAL
  Filled 2022-01-20: qty 1

## 2022-01-20 MED ORDER — POTASSIUM CHLORIDE 10 MEQ/100ML IV SOLN
10.0000 meq | INTRAVENOUS | Status: AC
  Administered 2022-01-20 (×2): 10 meq via INTRAVENOUS
  Filled 2022-01-20 (×2): qty 100

## 2022-01-20 MED ORDER — OXYCODONE-ACETAMINOPHEN 5-325 MG PO TABS
2.0000 | ORAL_TABLET | ORAL | Status: DC
  Administered 2022-01-20 – 2022-01-21 (×5): 2 via ORAL
  Filled 2022-01-20 (×5): qty 2

## 2022-01-20 MED ORDER — SODIUM CHLORIDE 0.9 % IV SOLN: 500.0000 mg | INTRAVENOUS | Status: DC

## 2022-01-20 MED ORDER — SODIUM CHLORIDE 0.9 % IV BOLUS
500.0000 mL | Freq: Once | INTRAVENOUS | Status: AC
  Administered 2022-01-20: 500 mL via INTRAVENOUS

## 2022-01-20 MED ORDER — SODIUM CHLORIDE 0.9 % IV SOLN
2.0000 g | INTRAVENOUS | Status: DC
  Administered 2022-01-20: 2 g via INTRAVENOUS
  Filled 2022-01-20 (×2): qty 20

## 2022-01-20 MED ORDER — METOCLOPRAMIDE HCL 5 MG/ML IJ SOLN: 10.0000 mg | Freq: Once | INTRAMUSCULAR | Status: DC

## 2022-01-20 MED ORDER — LACTATED RINGERS IV SOLN: INTRAVENOUS | Status: DC

## 2022-01-20 MED ORDER — BUDESONIDE 0.5 MG/2ML IN SUSP
0.5000 mg | Freq: Two times a day (BID) | RESPIRATORY_TRACT | Status: DC
  Administered 2022-01-20 – 2022-01-21 (×2): 0.5 mg via RESPIRATORY_TRACT
  Filled 2022-01-20 (×2): qty 2

## 2022-01-20 NOTE — ED Triage Notes (Signed)
Pt comes pov with rash, itching, chest pain. Was seen Friday for same. States she also has bad heart burn and is causing her to vomit.

## 2022-01-20 NOTE — Sepsis Progress Note (Signed)
Sepsis protocol is being followed by [facility]. 

## 2022-01-20 NOTE — ED Notes (Signed)
Pt to ED with husband for burning R chest pain since 2 days ago, states had brief relief from GI cocktail when seen here 2 days ago for same. EKG performed, ST on monitor with notched QRS. Complains of full body pruritis since 5 days ago, including palms of hands and soles of feet.

## 2022-01-20 NOTE — ED Notes (Signed)
EDP at bedside  

## 2022-01-20 NOTE — ED Provider Notes (Signed)
[facility] Provider Note    Event Date/Time   First MD Initiated Contact with Patient 01/20/22 1022     (approximate)   History   Chest Pain and Pruritis   HPI  Caroline Conley is a 56 y.o. female with nonischemic cardiomyopathy, hyperlipidemia who comes in with concerns for itching.  Patient was seen by myself 2 days ago and given a dose of Solu-Medrol, Benadryl, famotidine and the itching was mostly relieved.  She complained of some chest pain at that time but she reported that it mostly felt like her acid reflux.  She reports that the chest pain still feels like her acid reflux but now she is having diffuse vomiting.  Nonbloody nonbilious.  Denies any diarrhea.  Denies any coughing.  Patient is not itching as much as last time but still reports having some itching all over her body.   Physical Exam   Triage Vital Signs: ED Triage Vitals  Enc Vitals Group     BP 01/20/22 1022 (!) 152/100     Pulse Rate 01/20/22 1022 (!) 112     Resp 01/20/22 1022 14     Temp 01/20/22 1022 97.6 F (36.4 C)     Temp Source 01/20/22 1022 Oral     SpO2 01/20/22 1022 99 %     Weight 01/20/22 1012 127 lb 13.9 oz (58 kg)     Height 01/20/22 1012 5\' 6"  (1.676 m)     Head Circumference --      Peak Flow --      Pain Score 01/20/22 1012 10     Pain Loc --      Pain Edu? --      Excl. in [place]? --     Most recent vital signs: Vitals:   01/20/22 1022  BP: (!) 152/100  Pulse: (!) 112  Resp: 14  Temp: 97.6 F (36.4 C)  SpO2: 99%     General: Awake, no distress.  Patient is actively dry heaving CV:  Good peripheral perfusion.  No chest wall tenderness no crepitus.  Patient is tachycardic Resp:  Normal effort.  Clear lungs Abd:  No distention.  Soft and nontender Other:  Postop toe is without any evidence of infection   ED Results / Procedures / Treatments   Labs (all labs ordered are listed, but only abnormal results are displayed) Labs Reviewed  CULTURE,  BLOOD (ROUTINE X 2)  CULTURE, BLOOD (ROUTINE X 2)  RESP PANEL BY RT-PCR (FLU A&B, COVID) ARPGX2  LACTIC ACID, PLASMA  LACTIC ACID, PLASMA  CBC WITH DIFFERENTIAL/PLATELET  COMPREHENSIVE METABOLIC PANEL  PROCALCITONIN  TROPONIN I (HIGH SENSITIVITY)     EKG  My interpretation of EKG: Sinus tachycardia rate of 116 without any ST elevation or T wave version, and aVL with left bundle branch block   RADIOLOGY I have reviewed the xray personally and agree with radiology read x-ray negative  CT reviewed but pending radiology read   PROCEDURES:  Critical Care performed: yes   .1-3 Lead EKG Interpretation Performed by: Vanessa [place], MD Authorized by: Vanessa [place], MD     Interpretation: abnormal     ECG rate:  112   ECG rate assessment: tachycardic     Rhythm: sinus tachycardia     Ectopy: none     Conduction: normal   .Critical Care Performed by: Vanessa [place], MD Authorized by: Vanessa [place], MD   Critical care provider statement:    Critical  care time (minutes):  30   Critical care was necessary to treat or prevent imminent or life-threatening deterioration of the following conditions:  Sepsis   Critical care was time spent personally by me on the following activities:  Development of treatment plan with patient or surrogate, discussions with consultants, evaluation of patient's response to treatment, examination of patient, ordering and review of laboratory studies, ordering and review of radiographic studies, ordering and performing treatments and interventions, pulse oximetry, re-evaluation of patient's condition and review of old [place] ED: Medications  potassium chloride 10 mEq in 100 mL IVPB (10 mEq Intravenous New Bag/Given 01/20/22 1158)  promethazine (PHENERGAN) 12.5 mg in sodium chloride 0.9 % 50 mL IVPB (has no administration in time range)  cefTRIAXone (ROCEPHIN) 2 g in sodium chloride 0.9 % 100 mL IVPB (has no administration in time  range)  azithromycin (ZITHROMAX) 500 mg in sodium chloride 0.9 % 250 mL IVPB (has no administration in time range)  sodium chloride 0.9 % bolus 500 mL (has no administration in time range)  sodium chloride 0.9 % bolus 500 mL (0 mLs Intravenous Stopped 01/20/22 1157)  ondansetron (ZOFRAN) injection 4 mg (4 mg Intravenous Given 01/20/22 1102)  pantoprazole (PROTONIX) injection 40 mg (40 mg Intravenous Given 01/20/22 1105)  iohexol (OMNIPAQUE) 350 MG/ML injection 100 mL (100 mLs Intravenous Contrast Given 01/20/22 1223)     IMPRESSION / MDM / [place] / ED COURSE  I reviewed the triage vital signs and the nursing notes.  I saw patient yesterday for itching and she had notably elevated white count for unclear reason now she developed some vomiting.  Do not feel like this would be an esophageal perforation given the white count was elevated prior to the vomiting but I feel he would be best to proceed for causes of the vomiting will get CT imaging of her head and CT of her chest abdomen pelvis to further evaluate.  EKG, cardiac markers were ordered, labs to evaluate for any dehydration.  I also did get sepsis work-up given she is tachycardic with elevated white count.  Unclear source at this time so we will hold off on antibiotics.  We will give patient some IV fluid, IV Protonix, IV Zofran to help with symptoms  Lactate is elevated at 2.5.  Patient getting some IV fluids.  Do not want to fluid resuscitate her too much given her history of heart failure. Her CBC shows consistently elevated white count with left shift. Her CMP shows low potassium at 2.8.  Kidney function is stable Troponin is negative. Procalcitonin is negative which is reassuring  11:57 AM reevaluated patient and she is okay with proceeding with CT imaging as well as reports some continued nausea.  We will give some Phenergan.  CT imaging concerning for potential pneumonia.  Patient does report having a little bit of a cough as  well.  Given patient's lactate is rising in the setting of meet sepsis criteria will start on antibiotics get blood cultures and do sepsis alert.  Will discuss with hospital team for admission    The patient is on the cardiac monitor to evaluate for evidence of arrhythmia and/or significant heart rate changes     FINAL CLINICAL IMPRESSION(S) / ED DIAGNOSES   Final diagnoses:  Sepsis, due to unspecified organism, unspecified whether acute organ dysfunction present [facility])  Community acquired pneumonia, unspecified laterality     Rx / DC Orders   ED Discharge Orders  None        Note:  This document was prepared using Dragon voice recognition software and may include unintentional dictation errors.   Vanessa [place], MD 01/20/22 1409

## 2022-01-20 NOTE — ED Notes (Signed)
EKG performed.

## 2022-01-20 NOTE — H&P (Signed)
History and Physical    Patient: Caroline Conley DOB: March 24, 1966 DOA: 01/20/2022 DOS: the patient was seen and examined on 01/20/2022 PCP: Olin Hauser, DO  Patient coming from: Home  Chief Complaint:  Chief Complaint  Patient presents with   Chest Pain   Pruritis    HPI: Caroline Conley is a 56 y.o. female with medical history significant for COPD, dyslipidemia, nonischemic cardiomyopathy with last known LVEF of 35 to 40% from a 2D echocardiogram done on 07/22 who presents to the emergency room for the second time in 48 hours for evaluation of persistent nausea, vomiting and burning substernal chest pain with radiation of her throat. Patient was seen in the emergency room 2 days prior to her hospitalization and at bedtime presented for evaluation of pruritus and burning chest pain which she presumed was from acid reflux.  Patient states that she had taken Tums at home as well as Pepto-Bismol with transient improvement in her symptoms. During that ER visit she was noted to have marked leukocytosis of 21,000 with no obvious source of infection.  She received a dose of Solu-Medrol and was discharged on prednisone and Keflex. She returns to the ER 2 days later for evaluation of persistent nausea, vomiting and burning substernal chest pain.  She denies having any diaphoresis, no palpitations, no shortness of breath, no dizziness or lightheadedness. She also complains of chills as well as a productive cough but is unable to tell me what the color of the phlegm is. She denies having any sick contacts and has not had any fever. She denies having any abdominal pain, no changes in her bowel habits, no urinary symptoms, no leg swelling, no focal deficit or blurred vision.  Review of Systems: As mentioned in the history of present illness. All other systems reviewed and are negative. Past Medical History:  Diagnosis Date   Atypical chest pain    a. 2015 Cath ([place]): Nl cors.  Nl EF; b. 10/2018 MV: No ischemia/infarct. EF>65%.   Cancer ([place]) 01/2016   basal cell    Coronary vasospasm ([place])    a. 06/2021 Cath: LM nl, LADnl, LCX large/nl, RCA 35p (spasm-->tx w/ IC NTG w/ resultant VF req defib).   Dysplastic nevus 02/02/2015   R upper back - excision   Dysplastic nevus 02/16/2015   L hip - excision   Dysplastic nevus 08/24/2015   L upper abdomen   Dysplastic nevus 08/11/2017   mid abdomen - excised   Dysplastic nevus 07/29/2017   Left neck   Emphysema of lung ([place])    Gastritis    Hyperlipidemia    Melanoma ([place]) 08/24/2015   L buttocks - superficial spreading   NICM (nonischemic cardiomyopathy) ([place])    a. 06/22/2021 Echo: EF 40%, ant/antseptal/septal HK. Gr1 DD. Nl RV fxn. Mild-mod MR; b. 06/2021 Cath: Nl cors w/ RCA vasospasm (35%). VF w/ IC NTG admin req defib; c. 07/02/2021 Echo: EF 35-40%. Glob HK. Mild MR.   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 06/2021 s/p cardiac cath-->u/s w/ R radial occlusion from puncture extending up entire forearm-->Lovenox 1mg /kg BID x 4 wks; b. 07/2021 U/S: Thrombus in prox Radial arteray w/ minimal reconstitution of flow - improved w/ multiphasic waveforms in R brachial artery.   Palpitations    a. 07/2018 48h Holter: predominantly RSR, avg rate 106 (69-166 bpm), Rare PAC's and occas PVC's. 2 atrial runs up to 5 beats. No sustained arrhythmias or prolonged pauses. No symptoms reported;  b. 07/2021 Zio: Avg HR 102 bpm, intermittent bundle branch block, rare PACs and PVCs, 9 atrial runs, longest 13.7 secs w/ max HR of 226 bpm.   Wears dentures    full upper and lower   Past Surgical History:  Procedure Laterality Date   ABDOMINAL HYSTERECTOMY     APPENDECTOMY     BACK SURGERY     repeat lumbar herniated disc; apply cage   BUNIONECTOMY Right    CARDIAC CATHETERIZATION  09/20/2014   [facility] - Dr. Clayborn Bigness   CHOLECYSTECTOMY     COLONOSCOPY WITH PROPOFOL N/A 07/08/2016   Procedure: COLONOSCOPY WITH PROPOFOL;  Surgeon: Lucilla Lame, MD;  Location: [facility];  Service: Endoscopy;  Laterality: N/A;   DEBRIDEMENT TENNIS ELBOW     ESOPHAGOGASTRODUODENOSCOPY  09/16/2014   HARDWARE REMOVAL Left 01/04/2022   Procedure: HARDWARE REMOVAL;  Surgeon: Sharlotte Alamo, DPM;  Location: [facility];  Service: Podiatry;  Laterality: Left;   LEFT HEART CATH AND CORONARY ANGIOGRAPHY N/A 07/02/2021   Procedure: LEFT HEART CATH AND CORONARY ANGIOGRAPHY;  Surgeon: [name], MD;  Location: [facility];  Service: Cardiovascular;  Laterality: N/A;   LUMBAR DISC SURGERY     herniated disc   LUMBAR DISC SURGERY     took screws out from previous lumbar disc surgery   METATARSAL OSTEOTOMY  05/18/2013   ROTATOR CUFF REPAIR Right    TONSILLECTOMY     Social History:  reports that she quit smoking about 3 years ago. Her smoking use included cigarettes. She has a 30.00 pack-year smoking history. She has never used smokeless tobacco. She reports current drug use. Drug: Oxycodone. She reports that she does not drink alcohol.  Allergies  Allergen Reactions   2,4-D Dimethylamine (Amisol) Itching    (Pt unsure of this allergy)   Morphine And Related Itching   Vicodin [Hydrocodone-Acetaminophen] Nausea And Vomiting    GI distress    Family History  Problem Relation Age of Onset   Diabetes Mother    High blood pressure Mother    Gout Mother    Cancer Father    Stroke Father    Arthritis/Rheumatoid Father    Diabetes Father    High blood pressure Father    Heart failure Father    Heart failure Brother    Diabetes Brother     Prior to Admission medications   Medication Sig Start Date End Date Taking? Authorizing Provider  albuterol (PROVENTIL) (2.5 MG/3ML) 0.083% nebulizer solution Take 2.5 mg by nebulization every 6 (six) hours as needed for shortness of breath or wheezing.   Yes [provider]  cephALEXin (KEFLEX) 500 MG capsule Take 1 capsule (500 mg total) by mouth 2 (two) times daily for 7 days. 01/18/22  01/25/22 Yes Vanessa Ivey, MD  cetirizine (ZYRTEC ALLERGY) 10 MG tablet Take 1 tablet (10 mg total) by mouth daily for 14 days. 01/18/22 02/01/22 Yes Vanessa [place], MD  fluconazole (DIFLUCAN) 150 MG tablet Take 1 tablet by mouth on day 1. Repeat dose 2nd tab on day 3.   Yes [provider]  metoprolol succinate (TOPROL-XL) 25 MG 24 hr tablet Take 0.5 tablets (12.5 mg total) by mouth daily. 01/17/22 01/12/23 Yes End, Harrell Gave, MD  oxyCODONE-acetaminophen (PERCOCET) 10-325 MG tablet Take 1 tablet by mouth every 4 (four) hours.   Yes [provider]  predniSONE (DELTASONE) 20 MG tablet Take 1 tablet (20 mg total) by mouth daily with breakfast for 5 days. 01/18/22 01/23/22 Yes Funke,  Royetta Crochet, MD  PROAIR HFA 108 [phone] Base) MCG/ACT inhaler Inhale 2 puffs into the lungs every 4 (four) hours as needed for wheezing or shortness of breath. 11/23/21  Yes Karamalegos, Devonne Doughty, DO  roflumilast (DALIRESP) 500 MCG TABS tablet Take 1 tablet (500 mcg total) by mouth daily. 06/06/21  Yes Kasa, Maretta Bees, MD  TRELEGY ELLIPTA 100-62.5-25 MCG/INH AEPB INHALE 1 PUFF BY MOUTH ONCE DAILY 07/09/21  Yes [place] Ranger, Devonne Doughty, DO  magic mouthwash w/lidocaine SOLN Swish, gargle, and spit one to two teaspoonfuls every six hours as needed. Shake well before using. 10/29/21   Olin Hauser, DO  Misc. Devices (PULSE OXIMETER FOR FINGER) MISC 1 Device by Does not apply route as needed (for cough, dyspnea). Pulse oximeter for finger for checking oxygen saturation in COPD 11/24/19   Olin Hauser, DO    Physical Exam: Vitals:   01/20/22 1215 01/20/22 1308 01/20/22 1315 01/20/22 1400  BP:  (!) 128/92 134/85 132/80  Pulse: (!) 112 (!) 101 100 (!) 107  Resp: 18 20 16 14   Temp:      TempSrc:      SpO2: 95% 96% 95% 95%  Weight:      Height:       Physical Exam Vitals and nursing note reviewed.  Constitutional:      Appearance: She is well-developed and normal weight.  HENT:     Head:  Normocephalic and atraumatic.  Eyes:     Extraocular Movements: Extraocular movements intact.     Pupils: Pupils are equal, round, and reactive to light.  Cardiovascular:     Rate and Rhythm: Tachycardia present.  Pulmonary:     Effort: Tachypnea present.  Abdominal:     General: Bowel sounds are normal.     Palpations: Abdomen is soft.  Musculoskeletal:     Cervical back: Normal range of motion and neck supple.  Skin:    General: Skin is warm and dry.  Neurological:     Mental Status: She is alert.     Data Reviewed: Labs reviewed Marked leukocytosis of 21.3, potassium 2.8, serum creatinine at baseline 1.16 Lactic acid 2.5 >> 3.1 Troponin 11 Respiratory viral panel is reviewed and negative Chest x-ray reviewed by me shows no evidence of active cardiopulmonary disease CT angiogram of chest, abdomen and pelvis Showed Small pericardial effusion is present. Centrilobular emphysema. There are faint ground-glass densities in the posterior lower lung fields on both sides and in the anterior right mid lung fields suggesting atelectasis/pneumonia. There is no pleural effusion. There are no new results to review at this time.  Assessment and Plan:  Principal Problem:   Sepsis ([place]) Active Problems:   COPD with acute exacerbation ([place])   LBBB (left bundle branch block)   Nonischemic cardiomyopathy ([place])   CAP (community acquired pneumonia)   GERD (gastroesophageal reflux disease)   Hypokalemia   Acute gastritis    Sepsis from community-acquired pneumonia As evidenced by tachycardia, marked leukocytosis, imaging showing right middle lobe infiltrates and lactic acidosis. We will place patient empirically on IV Rocephin and Zithromax Judicious IV fluid resuscitation and monitor patient's respiratory status closely Follow-up results of blood cultures Trend lactic acid levels   Acute gastritis/GERD Supportive care with IV fluid hydration, antiemetics and IV PPI Keep patient  n.p.o. for now    Hypokalemia Secondary to GI losses from nausea and vomiting Supplement potassium Check magnesium levels     COPD with acute exacerbation Place patient on scheduled and as needed bronchodilator  therapy Place patient on inhaled steroids Continue DALIRESP     Nonischemic cardiomyopathy Last known LVEF 35 to 40% Continue metoprolol     Advance Care Planning:   Code Status: Full Code   Consults: None  Family Communication: Greater than 50% of time was spent discussing patient's condition and plan of care with her and her husband at the bedside.  All questions and concerns have been addressed.  They verbalized understanding and agree with the plan.  Severity of Illness: The appropriate patient status for this patient is INPATIENT. Inpatient status is judged to be reasonable and necessary in order to provide the required intensity of service to ensure the patient's safety. The patient's presenting symptoms, physical exam findings, and initial radiographic and laboratory data in the context of their chronic comorbidities is felt to place them at high risk for further clinical deterioration. Furthermore, it is not anticipated that the patient will be medically stable for discharge from the hospital within 2 midnights of admission.   * I certify that at the point of admission it is my clinical judgment that the patient will require inpatient hospital care spanning beyond 2 midnights from the point of admission due to high intensity of service, high risk for further deterioration and high frequency of surveillance required.*  Author: Collier Bullock, MD 01/20/2022 3:18 PM  For on call review [email or web address].

## 2022-01-20 NOTE — Progress Notes (Signed)
CODE SEPSIS - PHARMACY COMMUNICATION  **Broad Spectrum Antibiotics should be administered within 1 hour of Sepsis diagnosis**  Time Code Sepsis Called/Page Received: 1403  Antibiotics Ordered: ceftriaxone/azithromycin  Time of 1st antibiotic administration: Johnson, PharmD, BCPS Clinical Pharmacist 01/20/2022 2:31 PM

## 2022-01-21 DIAGNOSIS — K2900 Acute gastritis without bleeding: Secondary | ICD-10-CM | POA: Diagnosis not present

## 2022-01-21 LAB — LIPASE, BLOOD: Lipase: 23 U/L (ref 11–51)

## 2022-01-21 LAB — PROTIME-INR
INR: 1 (ref 0.8–1.2)
Prothrombin Time: 13.2 seconds (ref 11.4–15.2)

## 2022-01-21 LAB — CBC
HCT: 35.1 % — ABNORMAL LOW (ref 36.0–46.0)
Hemoglobin: 12.1 g/dL (ref 12.0–15.0)
MCH: 34.8 pg — ABNORMAL HIGH (ref 26.0–34.0)
MCHC: 34.5 g/dL (ref 30.0–36.0)
MCV: 100.9 fL — ABNORMAL HIGH (ref 80.0–100.0)
Platelets: 262 10*3/uL (ref 150–400)
RBC: 3.48 MIL/uL — ABNORMAL LOW (ref 3.87–5.11)
RDW: 12.8 % (ref 11.5–15.5)
WBC: 8.2 10*3/uL (ref 4.0–10.5)
nRBC: 0 % (ref 0.0–0.2)

## 2022-01-21 LAB — BASIC METABOLIC PANEL
Anion gap: 6 (ref 5–15)
BUN: 8 mg/dL (ref 6–20)
CO2: 27 mmol/L (ref 22–32)
Calcium: 8.8 mg/dL — ABNORMAL LOW (ref 8.9–10.3)
Chloride: 104 mmol/L (ref 98–111)
Creatinine, Ser: 1.03 mg/dL — ABNORMAL HIGH (ref 0.44–1.00)
GFR, Estimated: >60 mL/min (ref >60)
Glucose, Bld: 92 mg/dL (ref 70–99)
Potassium: 4 mmol/L (ref 3.5–5.1)
Sodium: 137 mmol/L (ref 135–145)

## 2022-01-21 LAB — PROCALCITONIN: Procalcitonin: 0.23 ng/mL

## 2022-01-21 LAB — CORTISOL-AM, BLOOD: Cortisol - AM: 13.7 ug/dL (ref 6.7–22.6)

## 2022-01-21 MED ORDER — OXYCODONE-ACETAMINOPHEN 5-325 MG PO TABS
2.0000 | ORAL_TABLET | ORAL | Status: DC
  Administered 2022-01-21: 2 via ORAL
  Filled 2022-01-21: qty 2

## 2022-01-21 MED ORDER — IPRATROPIUM-ALBUTEROL 0.5-2.5 (3) MG/3ML IN SOLN: 3.0000 mL | Freq: Two times a day (BID) | RESPIRATORY_TRACT | Status: DC

## 2022-01-21 NOTE — Progress Notes (Signed)
Patient turned to a Yellow MEWS at 19:38 at start of shift. Patient's vitals rechecked at 2300 after intervention and patient returned to a Green MEWS from a Yellow MEWS.    01/20/22 1938  Assess: MEWS Score  Temp 97.6 F (36.4 C)  BP 128/89  Pulse Rate (!) 121  Resp 19  Level of Consciousness Alert  SpO2 94 %  O2 Device Room Air  Assess: MEWS Score  MEWS Temp 0  MEWS Systolic 0  MEWS Pulse 2  MEWS RR 0  MEWS LOC 0  MEWS Score 2  MEWS Score Color Yellow  Assess: if the MEWS score is Yellow or Red  Were vital signs taken at a resting state? Yes  Focused Assessment Change from prior assessment (see assessment flowsheet)  Does the patient meet 2 or more of the SIRS criteria? No  MEWS guidelines implemented *See Row Information* No, vital signs rechecked  Treat  MEWS Interventions Administered scheduled meds/treatments  Pain Intervention(s) Medication (See eMAR)  Multiple Pain Sites No  Complains of Other (Comment) (Pain medication given)  Interventions Medication (see MAR)  Patients response to intervention Decreased  Take Vital Signs  Increase Vital Sign Frequency  Yellow: Q 2hr X 2 then Q 4hr X 2, if remains yellow, continue Q 4hrs (Will recheck at 2300)  Document  Patient Outcome Stabilized after interventions  Progress note created (see row info) Yes  Assess: SIRS CRITERIA  SIRS Temperature  0  SIRS Pulse 1  SIRS Respirations  0  SIRS WBC 0  SIRS Score Sum  1

## 2022-01-21 NOTE — Care Management CC44 (Signed)
Condition Code 44 Documentation Completed  Patient Details  Name: CYDNEE FUQUAY MRN: [id number] Date of Birth: [birth year]   Condition Code 44 given:  Yes Patient signature on Condition Code 44 notice:  Yes Documentation of 2 MD's agreement:  Yes Code 44 added to claim:  Yes    Kerin Salen, RN 01/21/2022, 1:00 PM

## 2022-01-21 NOTE — Discharge Summary (Signed)
Caroline Conley YKD:[id number] DOB: [birth year] DOA: 01/20/2022  PCP: Olin Hauser, DO  Admit date: 01/20/2022 Discharge date: 01/21/2022  Time spent: 35 minutes  Recommendations for Outpatient Follow-up:  Pcp f/u     Discharge Diagnoses:  Principal Problem:   Sepsis [facility]) Active Problems:   COPD with acute exacerbation ([place])   LBBB (left bundle branch block)   Nonischemic cardiomyopathy ([place])   CAP (community acquired pneumonia)   GERD (gastroesophageal reflux disease)   Hypokalemia   Acute gastritis   Discharge Condition: stable  Diet recommendation: heart healthy  Filed Weights   01/20/22 1012  Weight: 58 kg    History of present illness:  From dr. Dorthula Perfect h&P: MAIKO SALAIS is a 56 y.o. female with medical history significant for COPD, dyslipidemia, nonischemic cardiomyopathy with last known LVEF of 35 to 40% from a 2D echocardiogram done on 07/22 who presents to the emergency room for the second time in 48 hours for evaluation of persistent nausea, vomiting and burning substernal chest pain with radiation of her throat. Patient was seen in the emergency room 2 days prior to her hospitalization and at bedtime presented for evaluation of pruritus and burning chest pain which she presumed was from acid reflux.  Patient states that she had taken Tums at home as well as Pepto-Bismol with transient improvement in her symptoms. During that ER visit she was noted to have marked leukocytosis of 21,000 with no obvious source of infection.  She received a dose of Solu-Medrol and was discharged on prednisone and Keflex. She returns to the ER 2 days later for evaluation of persistent nausea, vomiting and burning substernal chest pain.  She denies having any diaphoresis, no palpitations, no shortness of breath, no dizziness or lightheadedness. She also complains of chills as well as a productive cough but is unable to tell me what the color of the phlegm is. She denies having  any sick contacts and has not had any fever. She denies having any abdominal pain, no changes in her bowel habits, no urinary symptoms, no leg swelling, no focal deficit or blurred vision.  Hospital Course:  Patient presented with GERD and one episode of vomiting. She was treated for COPD with exacerbation given findings of possible infiltrate on CXR, though the patient was asymptomatic, breathing normally without increased dyspnea or sputum production. Patient's GERD symptoms resolved and she did not have further vomiting. Her potassium was 2.8 on admission likely from vomiting, it normalized with supplementation. On hospital day 1 she says "I feel like I could run a marathon." Breathing comfortably, no abdominal or chest pain, tolerating her diet. Will discharge home without changes to medications though can stop keflex that was started at recent ED visit for possible infection at site of recent foot surgery, as there are no signs infection.  Procedures: none   Consultations: none  Discharge Exam: Vitals:   01/21/22 0756 01/21/22 1142  BP: 127/80 124/84  Pulse: (!) 102   Resp: 16 16  Temp: 99 F (37.2 C) 98.1 F (36.7 C)  SpO2: 90% 95%    General: NAD Cardiovascular: RRR Respiratory: Scattered rhonchi Abdomen: soft, non-tender, non-distended  Discharge Instructions   Discharge Instructions     Diet - low sodium heart healthy   Complete by: As directed    Increase activity slowly   Complete by: As directed    No dressing needed   Complete by: As directed       Allergies as of 01/21/2022  Reactions   2,4-d Dimethylamine (amisol) Itching   (Pt unsure of this allergy)   Morphine And Related Itching   Vicodin [hydrocodone-acetaminophen] Nausea And Vomiting   GI distress        Medication List     STOP taking these medications    cephALEXin 500 MG capsule Commonly known as: KEFLEX   fluconazole 150 MG tablet Commonly known as: DIFLUCAN       TAKE  these medications    albuterol (2.5 MG/3ML) 0.083% nebulizer solution Commonly known as: PROVENTIL Take 2.5 mg by nebulization every 6 (six) hours as needed for shortness of breath or wheezing.   ProAir HFA 108 (90 Base) MCG/ACT inhaler Generic drug: albuterol Inhale 2 puffs into the lungs every 4 (four) hours as needed for wheezing or shortness of breath.   cetirizine 10 MG tablet Commonly known as: ZyrTEC Allergy Take 1 tablet (10 mg total) by mouth daily for 14 days.   magic mouthwash w/lidocaine Soln Swish, gargle, and spit one to two teaspoonfuls every six hours as needed. Shake well before using.   metoprolol succinate 25 MG 24 hr tablet Commonly known as: TOPROL-XL Take 0.5 tablets (12.5 mg total) by mouth daily.   oxyCODONE-acetaminophen 10-325 MG tablet Commonly known as: PERCOCET Take 1 tablet by mouth every 4 (four) hours.   predniSONE 20 MG tablet Commonly known as: DELTASONE Take 1 tablet (20 mg total) by mouth daily with breakfast for 5 days.   Pulse Oximeter For Finger Misc 1 Device by Does not apply route as needed (for cough, dyspnea). Pulse oximeter for finger for checking oxygen saturation in COPD   roflumilast 500 MCG Tabs tablet Commonly known as: DALIRESP Take 1 tablet (500 mcg total) by mouth daily.   Trelegy Ellipta 100-62.5-25 MCG/ACT Aepb Generic drug: Fluticasone-Umeclidin-Vilant INHALE 1 PUFF BY MOUTH ONCE DAILY               Discharge Care Instructions  (From admission, onward)           Start     Ordered   01/21/22 0000  No dressing needed        01/21/22 1249           Allergies  Allergen Reactions   2,4-D Dimethylamine (Amisol) Itching    (Pt unsure of this allergy)   Morphine And Related Itching   Vicodin [Hydrocodone-Acetaminophen] Nausea And Vomiting    GI distress    Follow-up Information     Olin Hauser, DO Follow up.   Specialty: Family Medicine Contact information: [address]  [place] [address] [phone]         [name], MD .   Specialty: Cardiology Contact information: [address] [place] [address] [phone]                  The results of significant diagnostics from this hospitalization (including imaging, microbiology, ancillary and laboratory) are listed below for reference.    Significant Diagnostic Studies: DG Chest 2 View  Result Date: 01/18/2022 CLINICAL DATA:  A female at age 78 presenting with chest pain. EXAM: CHEST - 2 VIEW COMPARISON:  December 22, 2019. FINDINGS: Trachea is midline. Cardiomediastinal contours and hilar structures are normal. EKG leads stickers partially radio opaque seen over the upper lobes bilaterally. No lobar consolidation. No sign of pleural effusion. Mild hyperinflation. On limited assessment there is no acute skeletal process. Soft tissue anchors are present in the RIGHT proximal humerus. IMPRESSION:  Mild hyperinflation without signs of acute cardiopulmonary disease. Electronically Signed   By: Zetta Bills M.D.   On: 01/18/2022 11:55   CT HEAD WO CONTRAST (5MM)  Result Date: 01/20/2022 CLINICAL DATA:  Headache EXAM: CT HEAD WITHOUT CONTRAST TECHNIQUE: Contiguous axial images were obtained from the base of the skull through the vertex without intravenous contrast. RADIATION DOSE REDUCTION: This exam was performed according to the departmental dose-optimization program which includes automated exposure control, adjustment of the mA and/or kV according to patient size and/or use of iterative reconstruction technique. COMPARISON:  11/18/2008 FINDINGS: Brain: No evidence of acute infarction, hemorrhage, hydrocephalus, extra-axial collection or mass lesion/mass effect. Vascular: No hyperdense vessel or unexpected calcification. Skull: Normal. Negative for fracture or focal lesion. Sinuses/Orbits: No acute findings Other: None IMPRESSION: No acute intracranial abnormalities. Normal brain.  Electronically Signed   By: Kerby Moors M.D.   On: 01/20/2022 12:52   DG Chest Portable 1 View  Result Date: 01/20/2022 CLINICAL DATA:  Elevated white count EXAM: PORTABLE CHEST 1 VIEW COMPARISON:  01/18/2022 FINDINGS: Heart size and mediastinal contours are unremarkable. No pleural effusion or edema. No airspace opacities identified. Visualized osseous structures are unremarkable. IMPRESSION: No active disease. Electronically Signed   By: Kerby Moors M.D.   On: 01/20/2022 11:10   DG MINI C-ARM IMAGE ONLY  Result Date: 01/04/2022 There is no interpretation for this exam.  This order is for images obtained during a surgical procedure.  Please See "Surgeries" Tab for more information regarding the procedure.   CT Angio Chest/Abd/Pel for Dissection W and/or Wo Contrast  Result Date: 01/20/2022 CLINICAL DATA:  Chest pain EXAM: CT ANGIOGRAPHY CHEST, ABDOMEN AND PELVIS TECHNIQUE: 09/09/2019 Multidetector CT imaging through the chest, abdomen and pelvis was performed using the standard protocol during bolus administration of intravenous contrast. Multiplanar reconstructed images and MIPs were obtained and reviewed to evaluate the vascular anatomy. RADIATION DOSE REDUCTION: This exam was performed according to the departmental dose-optimization program which includes automated exposure control, adjustment of the mA and/or kV according to patient size and/or use of iterative reconstruction technique. CONTRAST:  154mL OMNIPAQUE IOHEXOL 350 MG/ML SOLN COMPARISON:  CT done on 09/09/2019, chest radiograph done on 01/20/2022 FINDINGS: CTA CHEST FINDINGS Cardiovascular: Small pericardial effusion is present. There is homogeneous enhancement in the thoracic aorta. Right subclavian is arising from the left side of aortic arch and is traversing the posterior mediastinum behind the esophagus. There are no intraluminal filling defects in the central pulmonary artery branches. Mediastinum/Nodes: No new significant  lymphadenopathy seen. Lungs/Pleura: Centrilobular emphysema is seen. There is no focal pulmonary consolidation. Small linear densities in the lingula have not changed, possibly suggesting scarring. There are faint ground-glass densities in the posterior lower lung fields. There is small ground-glass infiltrate in the anterior right mid lung fields. There is no pleural effusion or pneumothorax. Review of the MIP images confirms the above findings. CTA ABDOMEN AND PELVIS FINDINGS VASCULAR Aorta: Aorta is of normal caliber. There is no demonstrable intimal flap. There is no focal aneurysmal dilation. Celiac: Unremarkable. SMA: No focal stenosis. Renals: No significant focal stenosis. IMA: Patent. Iliac and common femoral artery are patent. Veins: No significant abnormality is seen. Left renal vein is retroaortic. Review of the MIP images confirms the above findings. NON-VASCULAR Hepatobiliary: Liver measures 18.9 cm in length. There is fatty infiltration. Surgical clips are seen in gallbladder fossa. Pancreas: No focal abnormality is seen. Spleen: Spleen measures 11.7 x 8.8 cm. No focal abnormality is seen. Adrenals/Urinary Tract: Adrenals  are not enlarged. There is no hydronephrosis. There are no renal or ureteral stones. Urinary bladder is unremarkable. Stomach/Bowel: Stomach is not distended. There is mild dilation of proximal small bowel loops. Rest of the small bowel loops are unremarkable. Appendix is not seen. There is no pericecal inflammation. There is no significant wall thickening in colon. Lymphatic: Unremarkable. Reproductive: Uterus is not seen. Other: There is no ascites or pneumoperitoneum. Musculoskeletal: There is surgical fusion at L5-S1 level. Review of the MIP images confirms the above findings. IMPRESSION: There is no evidence of dissection in the thoracic and abdominal aorta. Major branches of thoracic and abdominal aorta are patent. Small pericardial effusion is present. Centrilobular emphysema.  There are faint ground-glass densities in the posterior lower lung fields on both sides and in the anterior right mid lung fields suggesting atelectasis/pneumonia. There is no pleural effusion. There is no evidence of intestinal obstruction or pneumoperitoneum. There is no hydronephrosis. Enlarged fatty liver.  Enlarged spleen. Other findings as described in the body of the report. Electronically Signed   By: Elmer Picker M.D.   On: 01/20/2022 13:10    Microbiology: Recent Results (from the past 240 hour(s))  Blood culture (routine x 2)     Status: None (Preliminary result)   Collection Time: 01/20/22 10:45 AM   Specimen: BLOOD  Result Value Ref Range Status   Specimen Description BLOOD RAC  Final   Special Requests   Final    BOTTLES DRAWN AEROBIC AND ANAEROBIC Blood Culture adequate volume   Culture   Final    NO GROWTH < 24 HOURS Performed at [facility], [address], [place], [place] [address]    Report Status PENDING  Incomplete  Blood culture (routine x 2)     Status: None (Preliminary result)   Collection Time: 01/20/22 10:54 AM   Specimen: BLOOD  Result Value Ref Range Status   Specimen Description BLOOD RH  Final   Special Requests   Final    BOTTLES DRAWN AEROBIC AND ANAEROBIC Blood Culture adequate volume   Culture   Final    NO GROWTH < 24 HOURS Performed at [facility], [address], [place], [place] [address]    Report Status PENDING  Incomplete  Resp Panel by RT-PCR (Flu A&B, Covid) Nasopharyngeal Swab     Status: None   Collection Time: 01/20/22 10:54 AM   Specimen: Nasopharyngeal Swab; Nasopharyngeal(NP) swabs in vial transport medium  Result Value Ref Range Status   SARS Coronavirus 2 by RT PCR NEGATIVE NEGATIVE Final    Comment: (NOTE) SARS-CoV-2 target nucleic acids are NOT DETECTED.  The SARS-CoV-2 RNA is generally detectable in upper respiratory specimens during the acute phase of infection. The lowest concentration of  SARS-CoV-2 viral copies this assay can detect is 138 copies/mL. A negative result does not preclude SARS-Cov-2 infection and should not be used as the sole basis for treatment or other patient management decisions. A negative result may occur with  improper specimen collection/handling, submission of specimen other than nasopharyngeal swab, presence of viral mutation(s) within the areas targeted by this assay, and inadequate number of viral copies(<138 copies/mL). A negative result must be combined with clinical observations, patient history, and epidemiological information. The expected result is Negative.  Fact Sheet for Patients:  EntrepreneurPulse.com.au  Fact Sheet for Healthcare Providers:  IncredibleEmployment.be  This test is no t yet approved or cleared by the [place] FDA and  has been authorized for detection and/or diagnosis of SARS-CoV-2 by  FDA under an Emergency Use Authorization (EUA). This EUA will remain  in effect (meaning this test can be used) for the duration of the COVID-19 declaration under Section 564(b)(1) of the Act, 21 U.S.C.section 360bbb-3(b)(1), unless the authorization is terminated  or revoked sooner.       Influenza A by PCR NEGATIVE NEGATIVE Final   Influenza B by PCR NEGATIVE NEGATIVE Final    Comment: (NOTE) The Xpert Xpress SARS-CoV-2/FLU/RSV plus assay is intended as an aid in the diagnosis of influenza from Nasopharyngeal swab specimens and should not be used as a sole basis for treatment. Nasal washings and aspirates are unacceptable for Xpert Xpress SARS-CoV-2/FLU/RSV testing.  Fact Sheet for Patients: EntrepreneurPulse.com.au  Fact Sheet for Healthcare Providers: IncredibleEmployment.be  This test is not yet approved or cleared by the [place] FDA and has been authorized for detection and/or diagnosis of SARS-CoV-2 by FDA under an Emergency Use  Authorization (EUA). This EUA will remain in effect (meaning this test can be used) for the duration of the COVID-19 declaration under Section 564(b)(1) of the Act, 21 U.S.C. section 360bbb-3(b)(1), unless the authorization is terminated or revoked.  Performed at [facility], [address] [place], [place] [address]      Labs: Basic Metabolic Panel: Recent Labs  Lab 01/18/22 1118 01/20/22 1029 01/21/22 0342  NA 134* 138 137  K 3.6 2.8* 4.0  CL 97* 104 104  CO2 25 26 27   GLUCOSE 101* 103* 92  BUN 9 9 8   CREATININE 1.18* 1.16* 1.03*  CALCIUM 9.7 9.1 8.8*  MG  --  1.8  --    Liver Function Tests: Recent Labs  Lab 01/18/22 1118 01/20/22 1029  AST 19 23  ALT 12 10  ALKPHOS 87 81  BILITOT 0.3 0.4  PROT 6.8 6.6  ALBUMIN 3.6 3.5   Recent Labs  Lab 01/21/22 0342  LIPASE 23   No results for input(s): AMMONIA in the last 168 hours. CBC: Recent Labs  Lab 01/18/22 1118 01/20/22 1029 01/21/22 0342  WBC 21.9*   21.7* 21.3* 8.2  NEUTROABS 19.4* 18.6*  --   HGB 13.4   13.4 13.3 12.1  HCT 38.3   38.3 38.5 35.1*  MCV 99.0   98.7 99.2 100.9*  PLT 374   349 326 262   Cardiac Enzymes: No results for input(s): CKTOTAL, CKMB, CKMBINDEX, TROPONINI in the last 168 hours. BNP: BNP (last 3 results) No results for input(s): BNP in the last 8760 hours.  ProBNP (last 3 results) No results for input(s): PROBNP in the last 8760 hours.  CBG: No results for input(s): GLUCAP in the last 168 hours.     Signed:  Desma Maxim MD.  [facility] 01/21/2022, 12:50 PM

## 2022-01-21 NOTE — TOC Initial Note (Signed)
Transition of Care [facility]) - Initial/Assessment Note    Patient Details  Name: Caroline Conley MRN: [id number] Date of Birth: 07/13/1966  Transition of [facility]) CM/SW Contact:    Kerin Salen, RN Phone Number: 01/21/2022, 10:37 AM  Clinical Narrative:   Transition of Care [facility]) Screening Note   Patient Details  Name: Caroline Conley Date of Birth: [birth year]   Transition of Care [facility]) CM/SW Contact:    Kerin Salen, RN Phone Number: 01/21/2022, 10:37 AM    Transition of Care Department [facility]) has reviewed patient and no TOC needs have been identified at this time. We will continue to monitor patient advancement through interdisciplinary progression rounds. If new patient transition needs arise, please place a TOC consult.                         Patient Goals and CMS Choice        Expected Discharge Plan and Services                                                Prior Living Arrangements/Services                       Activities of Daily Living Home Assistive Devices/Equipment: Dentures (specify type) (Upper and lower) ADL Screening (condition at time of admission) Patient's cognitive ability adequate to safely complete daily activities?: Yes Is the patient deaf or have difficulty hearing?: No Does the patient have difficulty seeing, even when wearing glasses/contacts?: No Does the patient have difficulty concentrating, remembering, or making decisions?: No Patient able to express need for assistance with ADLs?: Yes Does the patient have difficulty dressing or bathing?: No Independently performs ADLs?: Yes (appropriate for developmental age) Does the patient have difficulty walking or climbing stairs?: No Weakness of Legs: None Weakness of Arms/Hands: None  Permission Sought/Granted                  Emotional Assessment              Admission diagnosis:  Sepsis ([place]) [A41.9] Community acquired pneumonia, unspecified  laterality [J18.9] Sepsis, due to unspecified organism, unspecified whether acute organ dysfunction present [facility]) [A41.9] Patient Active Problem List   Diagnosis Date Noted   Sepsis ([place]) 01/20/2022   CAP (community acquired pneumonia) 01/20/2022   GERD (gastroesophageal reflux disease) 01/20/2022   Hypokalemia 01/20/2022   Acute gastritis 01/20/2022   Pain of right upper extremity 07/12/2021   History of ventricular fibrillation 07/12/2021   Coronary vasospasm ([place]) 07/12/2021   Radial artery occlusion, right ([place]) 07/12/2021   Nonischemic cardiomyopathy ([place]) 07/02/2021   Abnormal stress test 07/02/2021   Ventricular fibrillation ([place]) 07/02/2021   LBBB (left bundle branch block) 06/20/2021   Chest pain 06/20/2021   Dizziness 03/30/2019   COPD with acute exacerbation ([place]) 03/30/2019   Panlobular emphysema ([place]) 03/02/2019   Tachycardia 07/30/2018   Palpitations 07/30/2018   SOB (shortness of breath) 07/30/2018   Full thickness rotator cuff tear 02/05/2018   Shoulder joint pain 02/05/2018   Chronic pain of both shoulders 01/01/2018   Chronic bursitis of right shoulder 01/01/2018   Frozen shoulder 01/01/2018   Personal history of malignant melanoma 07/28/2017   Hyperlipidemia 07/28/2017   Elevated serum creatinine 07/28/2017   HAV (hallux abducto valgus) 08/02/2016  Special screening for malignant neoplasms, colon    Angular cheilitis 06/07/2016   Tobacco abuse 06/07/2016   Melanoma of buttock (HCC) 08/30/2015   Acid indigestion 09/07/2014   Dyspepsia 09/07/2014   PCP:  Olin Hauser, DO Pharmacy:   [facility] [place] [address] Phone: [phone] Fax: [phone]  [facility] [id number], [place] [address] [place] [address] Phone: [phone] Fax: [phone]     Social Determinants of Health (SDOH) Interventions    Readmission Risk  Interventions No flowsheet data found.

## 2022-01-21 NOTE — Progress Notes (Addendum)
47 yof complains of chest pain 8/10 this shift. She is alert and oriented and coherent, but occasionally appears confused. Scheduled oxycodone administered per orders.  Patient is currently NPO and takes her medications whole with a sip of water/ cola. No complains of nausea and vomiting this shift.  Patient complains of an itch in right arm. No rashes noted. Arm cleansed and moisturizer applied. Patient's spouse is in room. Patient is currently asleep.  IV Zithromax administered. Lactic Acid recorded at 1930 was 1.8

## 2022-01-22 ENCOUNTER — Ambulatory Visit (INDEPENDENT_AMBULATORY_CARE_PROVIDER_SITE_OTHER): Payer: 59 | Admitting: Dermatology

## 2022-01-22 ENCOUNTER — Telehealth: Payer: Self-pay

## 2022-01-22 ENCOUNTER — Other Ambulatory Visit: Payer: Self-pay

## 2022-01-22 DIAGNOSIS — L503 Dermatographic urticaria: Secondary | ICD-10-CM

## 2022-01-22 DIAGNOSIS — L918 Other hypertrophic disorders of the skin: Secondary | ICD-10-CM | POA: Diagnosis not present

## 2022-01-22 DIAGNOSIS — L720 Epidermal cyst: Secondary | ICD-10-CM | POA: Diagnosis not present

## 2022-01-22 LAB — URINE CULTURE: Culture: NO GROWTH

## 2022-01-22 MED ORDER — HYDROXYZINE HCL 25 MG PO TABS: ORAL_TABLET | ORAL | 0 refills | Status: DC

## 2022-01-22 MED ORDER — CIMETIDINE 800 MG PO TABS: 800.0000 mg | ORAL_TABLET | Freq: Every day | ORAL | 0 refills | Status: DC

## 2022-01-22 NOTE — Telephone Encounter (Signed)
Transition Care Management Follow-up Telephone Call Date of discharge and from where: 01/21/2022 from [facility] How have you been since you were released from the hospital? Patient stated that she is feeling much better. Marjean Donna MD was fantastic per patient statement!! So was RN Erlene Quan!! Any questions or concerns? No  Items Reviewed: Did the pt receive and understand the discharge instructions provided? Yes  Medications obtained and verified? Yes  Other? No  Any new allergies since your discharge? No  Dietary orders reviewed? No Do you have support at home? Yes   Functional Questionnaire: (I = Independent and D = Dependent) ADLs: I Bathing/Dressing- I Meal Prep- I Eating- I Maintaining continence- I Transferring/Ambulation- I Managing Meds- I   Follow up appointments reviewed: PCP Hospital f/u appt confirmed? Yes  Scheduled to see [name], DO on 01/24/2022 @ 9:40am. [facility] f/u appt confirmed? No   Are transportation arrangements needed? No  If their condition worsens, is the pt aware to call PCP or go to the Emergency Dept.? Yes Was the patient provided with contact information for the PCP's office or ED? Yes Was to pt encouraged to call back with questions or concerns? Yes

## 2022-01-22 NOTE — Progress Notes (Signed)
° °  Follow-Up Visit   Subjective  Caroline Conley is a 56 y.o. female who presents for the following: Rash (Pt c/o itchy rash on her body started 4 days ago, pt went to the ER when this rash started and was prescribed Prednisone taper dose pack x 5 days, Rocephin, Zithromax tablets  with a poor response ). PT recently had foot surgery and she took Keflex tablets  Sister with patient   The following portions of the chart were reviewed this encounter and updated as appropriate:   Tobacco   Allergies   Meds   Problems   Med Hx   Surg Hx   Fam Hx       Review of Systems:  No other skin or systemic complaints except as noted in HPI or Assessment and Plan.  Objective  Well appearing patient in no apparent distress; mood and affect are within normal limits.  A focused examination was performed including face,exts,trunk . Relevant physical exam findings are noted in the Assessment and Plan.  arms, legs, chest,abdomen,back pink raised, inflamed lines or welts.   face Smooth white papule(s).     Assessment & Plan  Dermatographism arms, legs, chest,abdomen,back  Increase Zyrtec 10 mg to 2 tablet twice a day  Start Hydroxyzine 25 mg tablets take 1 every 6 to 8 hours prn itching (drowsiness warning)  Start Cimetidine 800 mg take 1 tablet daily   Recommend OTC Gold Bond Rapid Relief Anti-Itch cream (pramoxine + menthol), CeraVe Anti-itch cream or lotion (pramoxine), Sarna lotion (Original- menthol + camphor or Sensitive- pramoxine) or Eucerin 12 hour Itch Relief lotion (menthol) up to 3 times per day to areas on body that are itchy.   Related Medications hydrOXYzine (ATARAX) 25 MG tablet Take 1 tablet every 6 to 8 hours prn itching (drowsiness warning)  cimetidine (TAGAMET) 800 MG tablet Take 1 tablet (800 mg total) by mouth at bedtime.  Milia face  Benign. Observation.  Call clinic for new or changing moles.  Recommend daily use of broad spectrum spf 30+ sunscreen to sun-exposed  areas.      Acrochordons (Skin Tags) - Fleshy, skin-colored pedunculated papules - Benign appearing.  - Observe. - If desired, they can be removed with an in office procedure that is not covered by insurance. - Please call the clinic if you notice any new or changing lesions.   Return in about 3 weeks (around 02/12/2022) for Dermatographism.  I, Marye Round, CMA, am acting as scribe for Forest Gleason, MD .   Documentation: I have reviewed the above documentation for accuracy and completeness, and I agree with the above.  Forest Gleason, MD

## 2022-01-22 NOTE — Patient Instructions (Addendum)
Recommend OTC Gold Bond Rapid Relief Anti-Itch cream (pramoxine + menthol), CeraVe Anti-itch cream or lotion (pramoxine), Sarna lotion (Original- menthol + camphor or Sensitive- pramoxine) or Eucerin 12 hour Itch Relief lotion (menthol) up to 3 times per day to areas on body that are itchy.   If You Need Anything After Your Visit  If you have any questions or concerns for your doctor, please call our main line at [phone] and press option 4 to reach your doctor's medical assistant. If no one answers, please leave a voicemail as directed and we will return your call as soon as possible. Messages left after 4 pm will be answered the following business day.   You may also send [place] a message via [place]. We typically respond to MyChart messages within 1-2 business days.  For prescription refills, please ask your pharmacy to contact our office. Our fax number is [phone].  If you have an urgent issue when the clinic is closed that cannot wait until the next business day, you can page your doctor at the number below.    Please note that while we do our best to be available for urgent issues outside of office hours, we are not available 24/7.   If you have an urgent issue and are unable to reach [place], you may choose to seek medical care at your doctor's office, retail clinic, urgent care center, or emergency room.  If you have a medical emergency, please immediately call 911 or go to the emergency department.  Pager Numbers  - Dr. Nehemiah Massed: [phone]  - Dr. Laurence Ferrari: [phone]  - Dr. Nicole Kindred: [phone]  In the event of inclement weather, please call our main line at [phone] for an update on the status of any delays or closures.  Dermatology Medication Tips: Please keep the boxes that topical medications come in in order to help keep track of the instructions about where and how to use these. Pharmacies typically print the medication instructions only on the boxes and not  directly on the medication tubes.   If your medication is too expensive, please contact our office at [phone] option 4 or send [place] a message through [place].   We are unable to tell what your co-pay for medications will be in advance as this is different depending on your insurance coverage. However, we may be able to find a substitute medication at lower cost or fill out paperwork to get insurance to cover a needed medication.   If a prior authorization is required to get your medication covered by your insurance company, please allow [place] 1-2 business days to complete this process.  Drug prices often vary depending on where the prescription is filled and some pharmacies may offer cheaper prices.  The website www.goodrx.com contains coupons for medications through different pharmacies. The prices here do not account for what the cost may be with help from insurance (it may be cheaper with your insurance), but the website can give you the price if you did not use any insurance.  - You can print the associated coupon and take it with your prescription to the pharmacy.  - You may also stop by our office during regular business hours and pick up a GoodRx coupon card.  - If you need your prescription sent electronically to a different pharmacy, notify our office through [facility] or by phone at [phone] option 4.     Si Usted Necesita Algo Despus de Su Visita  Tambin puede enviarnos un mensaje a  travs de MyChart. Por lo general respondemos a los mensajes de MyChart en el transcurso de 1 a 2 das hbiles.  Para renovar recetas, por favor pida a su farmacia que se ponga en contacto con nuestra oficina. Harland Dingwall de fax es [place] [phone].  Si tiene un asunto urgente cuando la clnica est cerrada y que no puede esperar hasta el siguiente da hbil, puede llamar/localizar a su doctor(a) al nmero que aparece a continuacin.   Por favor, tenga en cuenta que aunque hacemos  todo lo posible para estar disponibles para asuntos urgentes fuera del horario de [place], no estamos disponibles las 24 horas del da, los 7 das de la [place].   Si tiene un problema urgente y no puede comunicarse con nosotros, puede optar por buscar atencin mdica  en el consultorio de su doctor(a), en una clnica privada, en un centro de atencin urgente o en una sala de emergencias.  Si tiene Engineering geologist, por favor llame inmediatamente al 911 o vaya a la sala de emergencias.  Nmeros de bper  - Dr. Nehemiah Massed: [phone]  - Dra. Moye: [phone]  - Dra. Nicole Kindred: [phone]  En caso de inclemencias del [place], por favor llame a Johnsie Kindred principal al [phone] para una actualizacin sobre el [place] de cualquier retraso o cierre.  Consejos para la medicacin en dermatologa: Por favor, guarde las cajas en las que vienen los medicamentos de uso tpico para ayudarle a seguir las instrucciones sobre dnde y cmo usarlos. Las farmacias generalmente imprimen las instrucciones del medicamento slo en las cajas y no directamente en los tubos del [place].   Si su medicamento es muy caro, por favor, pngase en contacto con Zigmund Daniel llamando al [phone] y presione la opcin 4 o envenos un mensaje a travs de Pharmacist, community.   No podemos decirle cul ser su copago por los medicamentos por adelantado ya que esto es diferente dependiendo de la cobertura de su seguro. Sin embargo, es posible que podamos encontrar un medicamento sustituto a Electrical engineer un formulario para que el seguro cubra el medicamento que se considera necesario.   Si se requiere una autorizacin previa para que su compaa de seguros [place] su medicamento, por favor permtanos de 1 a 2 das hbiles para completar este proceso.  Los precios de los medicamentos varan con frecuencia dependiendo del Environmental consultant de dnde se surte la receta y alguna farmacias pueden ofrecer precios ms baratos.  El  sitio web www.goodrx.com tiene cupones para medicamentos de Airline pilot. Los precios aqu no tienen en cuenta lo que podra costar con la ayuda del seguro (puede ser ms barato con su seguro), pero el sitio web puede darle el precio si no utiliz Research scientist (physical sciences).  - Puede imprimir el cupn correspondiente y llevarlo con su receta a la farmacia.  - Tambin puede pasar por nuestra oficina durante el horario de atencin regular y Charity fundraiser una tarjeta de cupones de GoodRx.  - Si necesita que su receta se enve electrnicamente a una farmacia diferente, informe a nuestra oficina a travs de MyChart de [place] o por telfono llamando al [phone] y presione la opcin 4.

## 2022-01-24 ENCOUNTER — Other Ambulatory Visit: Payer: Self-pay

## 2022-01-24 ENCOUNTER — Other Ambulatory Visit: Payer: Self-pay | Admitting: Family Medicine

## 2022-01-24 ENCOUNTER — Encounter: Payer: Self-pay | Admitting: Family Medicine

## 2022-01-24 ENCOUNTER — Ambulatory Visit (INDEPENDENT_AMBULATORY_CARE_PROVIDER_SITE_OTHER): Payer: 59 | Admitting: Family Medicine

## 2022-01-24 VITALS — BP 112/84 | HR 123 | Ht 66.0 in | Wt 130.4 lb

## 2022-01-24 DIAGNOSIS — J441 Chronic obstructive pulmonary disease with (acute) exacerbation: Secondary | ICD-10-CM

## 2022-01-24 DIAGNOSIS — J189 Pneumonia, unspecified organism: Secondary | ICD-10-CM

## 2022-01-24 DIAGNOSIS — E876 Hypokalemia: Secondary | ICD-10-CM | POA: Diagnosis not present

## 2022-01-24 DIAGNOSIS — R112 Nausea with vomiting, unspecified: Secondary | ICD-10-CM | POA: Diagnosis not present

## 2022-01-24 MED ORDER — PREDNISONE 20 MG PO TABS: ORAL_TABLET | ORAL | 0 refills | Status: DC

## 2022-01-24 MED ORDER — PROMETHAZINE HCL 25 MG PO TABS: 12.5000 mg | ORAL_TABLET | Freq: Three times a day (TID) | ORAL | 0 refills | Status: DC | PRN

## 2022-01-24 MED ORDER — LEVOFLOXACIN 750 MG PO TABS: 750.0000 mg | ORAL_TABLET | Freq: Every day | ORAL | 0 refills | Status: DC

## 2022-01-24 MED ORDER — HYDROCOD POLI-CHLORPHE POLI ER 10-8 MG/5ML PO SUER: 5.0000 mL | Freq: Two times a day (BID) | ORAL | 0 refills | Status: DC | PRN

## 2022-01-24 NOTE — Progress Notes (Signed)
Subjective:    Patient ID: Caroline Conley, female    DOB: 12-Jun-1966, 56 y.o.   MRN: [id number]  Caroline Conley is a 56 y.o. female presenting on 01/24/2022 for Hospitalization Follow-up   HPI  HOSPITAL FOLLOW-UP VISIT  Hospital/Location: [facility] Date of Admission: 01/20/22 Date of Discharge: 01/21/22 Transitions of care telephone call: 01/22/22 completed by Pink  Reason for Admission: Acute illness with Pneumonia, Sepsis, nausea vomiting  - Hospital H&P and Discharge Summary have been reviewed - Patient presents today 3 days after recent hospitalization. Brief summary of recent course, patient had symptoms of itching initially and elevated WBC went to ED on 01/18/22 - discharged and then returned on 01/20/22, hospitalized, still elevated WBC >21 there and further work up showed Pneumonia on imaging, treated with Solu Medrol, IV antibiotics, and then discharged with prednisone small amount, and no further oral keflex.  - Today reports overall has done well after discharge. Symptoms of nausea vomiting have resolved but still fatigued and tired.  Hypokalemia lately, considered due to Albuterol frequency   I have reviewed the discharge medication list, and have reconciled the current and discharge medications today.   Current Outpatient Medications:    albuterol (PROVENTIL) (2.5 MG/3ML) 0.083% nebulizer solution, Take 2.5 mg by nebulization every 6 (six) hours as needed for shortness of breath or wheezing., Disp: , Rfl:    cetirizine (ZYRTEC ALLERGY) 10 MG tablet, Take 1 tablet (10 mg total) by mouth daily for 14 days., Disp: 14 tablet, Rfl: 0   chlorpheniramine-HYDROcodone (TUSSIONEX PENNKINETIC ER) 10-8 MG/5ML, Take 5 mLs by mouth every 12 (twelve) hours as needed for cough., Disp: 140 mL, Rfl: 0   cimetidine (TAGAMET) 800 MG tablet, Take 1 tablet (800 mg total) by mouth at bedtime., Disp: 30 tablet, Rfl: 0   hydrOXYzine (ATARAX) 25 MG tablet, Take 1 tablet every 6 to 8 hours  prn itching (drowsiness warning), Disp: 30 tablet, Rfl: 0   levofloxacin (LEVAQUIN) 750 MG tablet, Take 1 tablet (750 mg total) by mouth daily., Disp: 5 tablet, Rfl: 0   magic mouthwash w/lidocaine SOLN, Swish, gargle, and spit one to two teaspoonfuls every six hours as needed. Shake well before using., Disp: 120 mL, Rfl: 0   metoprolol succinate (TOPROL-XL) 25 MG 24 hr tablet, Take 0.5 tablets (12.5 mg total) by mouth daily., Disp: 45 tablet, Rfl: 3   Misc. Devices (PULSE OXIMETER FOR FINGER) MISC, 1 Device by Does not apply route as needed (for cough, dyspnea). Pulse oximeter for finger for checking oxygen saturation in COPD, Disp: 1 each, Rfl: 0   oxyCODONE-acetaminophen (PERCOCET) 10-325 MG tablet, Take 1 tablet by mouth every 4 (four) hours., Disp: , Rfl:    predniSONE (DELTASONE) 20 MG tablet, Take 2 tablets daily (40mg ) for 4 days, take 1 tab daily (20mg ) for 4 days, take half tab daily (10mg ) for 2 days, Disp: 13 tablet, Rfl: 0   PROAIR HFA 108 (90 Base) MCG/ACT inhaler, Inhale 2 puffs into the lungs every 4 (four) hours as needed for wheezing or shortness of breath., Disp: 8.5 g, Rfl: 3   promethazine (PHENERGAN) 25 MG tablet, Take 0.5-1 tablets (12.5-25 mg total) by mouth every 8 (eight) hours as needed for nausea or vomiting., Disp: 20 tablet, Rfl: 0   roflumilast (DALIRESP) 500 MCG TABS tablet, Take 1 tablet (500 mcg total) by mouth daily., Disp: 30 tablet, Rfl: 5   TRELEGY ELLIPTA 100-62.5-25 MCG/INH AEPB, INHALE 1 PUFF BY MOUTH ONCE DAILY, Disp: 60  each, Rfl: 6  ------------------------------------------------------------------------- Social History   Tobacco Use   Smoking status: Former    Packs/day: 1.00    Years: 30.00    Pack years: 30.00    Types: Cigarettes    Quit date: 03/02/2018    Years since quitting: 3.9   Smokeless tobacco: Never   Tobacco comments:    0.5 to 1 ppd >30 years  Vaping Use   Vaping Use: Never used  Substance Use Topics   Alcohol use: No   Drug  use: Yes    Types: Oxycodone    Review of Systems Per HPI unless specifically indicated above     Objective:    BP 112/84    Pulse (!) 123    Ht 5\' 6"  (1.676 m)    Wt 130 lb 6.4 oz (59.1 kg)    SpO2 95%    BMI 21.05 kg/m   Wt Readings from Last 3 Encounters:  01/24/22 130 lb 6.4 oz (59.1 kg)  01/20/22 127 lb 13.9 oz (58 kg)  01/18/22 128 lb 1.4 oz (58.1 kg)    Physical Exam Vitals and nursing note reviewed.  Constitutional:      General: She is not in acute distress.    Appearance: She is well-developed. She is not diaphoretic.     Comments: Well-appearing, comfortable, cooperative  HENT:     Head: Normocephalic and atraumatic.  Eyes:     General:        Right eye: No discharge.        Left eye: No discharge.     Conjunctiva/sclera: Conjunctivae normal.  Neck:     Thyroid: No thyromegaly.  Cardiovascular:     Rate and Rhythm: Normal rate and regular rhythm.     Heart sounds: Normal heart sounds. No murmur heard. Pulmonary:     Effort: Pulmonary effort is normal. No respiratory distress.     Breath sounds: No wheezing or rales.     Comments: Reduced air movement, R sided lung fields mid to low with rhonchi  Musculoskeletal:        General: Normal range of motion.     Cervical back: Normal range of motion and neck supple.  Lymphadenopathy:     Cervical: No cervical adenopathy.  Skin:    General: Skin is warm and dry.     Findings: No erythema or rash.  Neurological:     Mental Status: She is alert and oriented to person, place, and time.  Psychiatric:        Behavior: Behavior normal.     Comments: Well groomed, good eye contact, normal speech and thoughts    I have personally reviewed the radiology report from 01/20/22 on CT.  CLINICAL DATA:  Chest pain   EXAM: CT ANGIOGRAPHY CHEST, ABDOMEN AND PELVIS   TECHNIQUE: 09/09/2019   Multidetector CT imaging through the chest, abdomen and pelvis was performed using the standard protocol during bolus  administration of intravenous contrast. Multiplanar reconstructed images and MIPs were obtained and reviewed to evaluate the vascular anatomy.   RADIATION DOSE REDUCTION: This exam was performed according to the departmental dose-optimization program which includes automated exposure control, adjustment of the mA and/or kV according to patient size and/or use of iterative reconstruction technique.   CONTRAST:  113mL OMNIPAQUE IOHEXOL 350 MG/ML SOLN   COMPARISON:  CT done on 09/09/2019, chest radiograph done on 01/20/2022   FINDINGS: CTA CHEST FINDINGS   Cardiovascular: Small pericardial effusion is present. There is homogeneous enhancement in  the thoracic aorta. Right subclavian is arising from the left side of aortic arch and is traversing the posterior mediastinum behind the esophagus. There are no intraluminal filling defects in the central pulmonary artery branches.   Mediastinum/Nodes: No new significant lymphadenopathy seen.   Lungs/Pleura: Centrilobular emphysema is seen. There is no focal pulmonary consolidation. Small linear densities in the lingula have not changed, possibly suggesting scarring. There are faint ground-glass densities in the posterior lower lung fields. There is small ground-glass infiltrate in the anterior right mid lung fields. There is no pleural effusion or pneumothorax.   Review of the MIP images confirms the above findings.   CTA ABDOMEN AND PELVIS FINDINGS   VASCULAR   Aorta: Aorta is of normal caliber. There is no demonstrable intimal flap. There is no focal aneurysmal dilation.   Celiac: Unremarkable.   SMA: No focal stenosis.   Renals: No significant focal stenosis.   IMA: Patent.   Iliac and common femoral artery are patent.   Veins: No significant abnormality is seen. Left renal vein is retroaortic.   Review of the MIP images confirms the above findings.   NON-VASCULAR   Hepatobiliary: Liver measures 18.9 cm in length.  There is fatty infiltration. Surgical clips are seen in gallbladder fossa.   Pancreas: No focal abnormality is seen.   Spleen: Spleen measures 11.7 x 8.8 cm. No focal abnormality is seen.   Adrenals/Urinary Tract: Adrenals are not enlarged. There is no hydronephrosis. There are no renal or ureteral stones. Urinary bladder is unremarkable.   Stomach/Bowel: Stomach is not distended. There is mild dilation of proximal small bowel loops. Rest of the small bowel loops are unremarkable. Appendix is not seen. There is no pericecal inflammation. There is no significant wall thickening in colon.   Lymphatic: Unremarkable.   Reproductive: Uterus is not seen.   Other: There is no ascites or pneumoperitoneum.   Musculoskeletal: There is surgical fusion at L5-S1 level.   Review of the MIP images confirms the above findings.   IMPRESSION: There is no evidence of dissection in the thoracic and abdominal aorta. Major branches of thoracic and abdominal aorta are patent.   Small pericardial effusion is present. Centrilobular emphysema. There are faint ground-glass densities in the posterior lower lung fields on both sides and in the anterior right mid lung fields suggesting atelectasis/pneumonia. There is no pleural effusion.   There is no evidence of intestinal obstruction or pneumoperitoneum. There is no hydronephrosis.   Enlarged fatty liver.  Enlarged spleen.   Other findings as described in the body of the report.     Electronically Signed   By: Elmer Picker M.D.   On: 01/20/2022 13:10   Results for orders placed or performed during the hospital encounter of 01/20/22  Blood culture (routine x 2)   Specimen: BLOOD  Result Value Ref Range   Specimen Description BLOOD RH    Special Requests      BOTTLES DRAWN AEROBIC AND ANAEROBIC Blood Culture adequate volume   Culture      NO GROWTH 4 DAYS Performed at [facility], [address], [place], [place]  [address]    Report Status PENDING   Blood culture (routine x 2)   Specimen: BLOOD  Result Value Ref Range   Specimen Description BLOOD RAC    Special Requests      BOTTLES DRAWN AEROBIC AND ANAEROBIC Blood Culture adequate volume   Culture      NO GROWTH 4 DAYS Performed at [facility], [address], [place], [place] [address]    Report Status PENDING   Resp Panel by RT-PCR (Flu A&B, Covid) Nasopharyngeal Swab   Specimen: Nasopharyngeal Swab; Nasopharyngeal(NP) swabs in vial transport medium  Result Value Ref Range   SARS Coronavirus 2 by RT PCR NEGATIVE NEGATIVE   Influenza A by PCR NEGATIVE NEGATIVE   Influenza B by PCR NEGATIVE NEGATIVE  Urine Culture   Specimen: Urine, Random  Result Value Ref Range   Specimen Description      URINE, RANDOM Performed at [facility], [address] [place], [place] [address]    Special Requests      NONE Performed at [facility], [address], [place], [place] [address]    Culture      NO GROWTH Performed at [facility], [place] [address] [place], [place] [address]    Report Status 01/22/2022 FINAL   Lactic acid, plasma  Result Value Ref Range   Lactic Acid, Venous 2.5 (HH) 0.5 - 1.9 mmol/L  Lactic acid, plasma  Result Value Ref Range   Lactic Acid, Venous 3.1 (HH) 0.5 - 1.9 mmol/L  CBC with Differential  Result Value Ref Range   WBC 21.3 (H) 4.0 - 10.5 K/uL   RBC 3.88 3.87 - 5.11 MIL/uL   Hemoglobin 13.3 12.0 - 15.0 g/dL   HCT 38.5 36.0 - 46.0 %   MCV 99.2 80.0 - 100.0 fL   MCH 34.3 (H) 26.0 - 34.0 pg   MCHC 34.5 30.0 - 36.0 g/dL   RDW 12.9 11.5 - 15.5 %   Platelets 326 150 - 400 K/uL   nRBC 0.0 0.0 - 0.2 %   Neutrophils Relative % 88 %   Neutro Abs 18.6 (H) 1.7 - 7.7 K/uL   Lymphocytes Relative 4 %   Lymphs Abs 0.9 0.7 - 4.0 K/uL   Monocytes Relative 7 %   Monocytes Absolute 1.4 (H) 0.1 - 1.0 K/uL   Eosinophils Relative 0 %   Eosinophils Absolute 0.1 0.0 - 0.5 K/uL   Basophils  Relative 0 %   Basophils Absolute 0.0 0.0 - 0.1 K/uL   Immature Granulocytes 1 %   Abs Immature Granulocytes 0.27 (H) 0.00 - 0.07 K/uL  Comprehensive metabolic panel  Result Value Ref Range   Sodium 138 135 - 145 mmol/L   Potassium 2.8 (L) 3.5 - 5.1 mmol/L   Chloride 104 98 - 111 mmol/L   CO2 26 22 - 32 mmol/L   Glucose, Bld 103 (H) 70 - 99 mg/dL   BUN 9 6 - 20 mg/dL   Creatinine, Ser 1.16 (H) 0.44 - 1.00 mg/dL   Calcium 9.1 8.9 - 10.3 mg/dL   Total Protein 6.6 6.5 - 8.1 g/dL   Albumin 3.5 3.5 - 5.0 g/dL   AST 23 15 - 41 U/L   ALT 10 0 - 44 U/L   Alkaline Phosphatase 81 38 - 126 U/L   Total Bilirubin 0.4 0.3 - 1.2 mg/dL   GFR, Estimated 55 (L) >60 mL/min   Anion gap 8 5 - 15  Procalcitonin - Baseline  Result Value Ref Range   Procalcitonin <0.10 ng/mL  Urinalysis, Routine w reflex microscopic  Result Value Ref Range   Color, Urine STRAW (A) YELLOW   APPearance CLEAR CLEAR   Specific Gravity, Urine 1.010 1.005 - 1.030   pH 7.0 5.0 - 8.0   Glucose, UA NEGATIVE NEGATIVE mg/dL   Hgb urine dipstick TRACE (A) NEGATIVE  Bilirubin Urine NEGATIVE NEGATIVE   Ketones, ur NEGATIVE NEGATIVE mg/dL   Protein, ur NEGATIVE NEGATIVE mg/dL   Nitrite NEGATIVE NEGATIVE   Leukocytes,Ua NEGATIVE NEGATIVE   RBC / HPF 0-5 0 - 5 RBC/hpf   WBC, UA 0-5 0 - 5 WBC/hpf   Bacteria, UA NONE SEEN NONE SEEN   Squamous Epithelial / LPF 0-5 0 - 5  Magnesium  Result Value Ref Range   Magnesium 1.8 1.7 - 2.4 mg/dL  Lactic acid, plasma  Result Value Ref Range   Lactic Acid, Venous 2.5 (HH) 0.5 - 1.9 mmol/L  Lactic acid, plasma  Result Value Ref Range   Lactic Acid, Venous 1.8 0.5 - 1.9 mmol/L  Procalcitonin  Result Value Ref Range   Procalcitonin 0.23 ng/mL  Protime-INR  Result Value Ref Range   Prothrombin Time 13.2 11.4 - 15.2 seconds   INR 1.0 0.8 - 1.2  Cortisol-am, blood  Result Value Ref Range   Cortisol - AM 13.7 6.7 - 22.6 ug/dL  Basic metabolic panel  Result Value Ref Range   Sodium  137 135 - 145 mmol/L   Potassium 4.0 3.5 - 5.1 mmol/L   Chloride 104 98 - 111 mmol/L   CO2 27 22 - 32 mmol/L   Glucose, Bld 92 70 - 99 mg/dL   BUN 8 6 - 20 mg/dL   Creatinine, Ser 1.03 (H) 0.44 - 1.00 mg/dL   Calcium 8.8 (L) 8.9 - 10.3 mg/dL   GFR, Estimated >60 >60 mL/min   Anion gap 6 5 - 15  CBC  Result Value Ref Range   WBC 8.2 4.0 - 10.5 K/uL   RBC 3.48 (L) 3.87 - 5.11 MIL/uL   Hemoglobin 12.1 12.0 - 15.0 g/dL   HCT 35.1 (L) 36.0 - 46.0 %   MCV 100.9 (H) 80.0 - 100.0 fL   MCH 34.8 (H) 26.0 - 34.0 pg   MCHC 34.5 30.0 - 36.0 g/dL   RDW 12.8 11.5 - 15.5 %   Platelets 262 150 - 400 K/uL   nRBC 0.0 0.0 - 0.2 %  Lipase, blood  Result Value Ref Range   Lipase 23 11 - 51 U/L  Troponin I (High Sensitivity)  Result Value Ref Range   Troponin I (High Sensitivity) 11 <18 ng/L  Troponin I (High Sensitivity)  Result Value Ref Range   Troponin I (High Sensitivity) 11 <18 ng/L      Assessment & Plan:   Problem List Items Addressed This Visit     Hypokalemia   CAP (community acquired pneumonia) - Primary   Relevant Medications   levofloxacin (LEVAQUIN) 750 MG tablet   predniSONE (DELTASONE) 20 MG tablet   promethazine (PHENERGAN) 25 MG tablet   chlorpheniramine-HYDROcodone (TUSSIONEX PENNKINETIC ER) 10-8 MG/5ML   Other Visit Diagnoses     Nausea and vomiting, unspecified vomiting type       Relevant Medications   promethazine (PHENERGAN) 25 MG tablet       HFU for CAP Hypokalemia Improved overall, now still has residual respiratory symptoms with persistent cough, some dyspnea Worsening in past 24-48 hours off medication. Will DC Keflex, instead will need respiratory therapy Start Levaquin 750 daily x 5 days Tussionex PRN Prednisone taper, she usually has required extended taper Last lab K was normal Likely from frequent Albuterol use to lower serum K, will recheck at cardiology or we can repeat here    Meds ordered this encounter  Medications   levofloxacin  (LEVAQUIN) 750 MG tablet  Sig: Take 1 tablet (750 mg total) by mouth daily.    Dispense:  5 tablet    Refill:  0   predniSONE (DELTASONE) 20 MG tablet    Sig: Take 2 tablets daily (40mg ) for 4 days, take 1 tab daily (20mg ) for 4 days, take half tab daily (10mg ) for 2 days    Dispense:  13 tablet    Refill:  0   promethazine (PHENERGAN) 25 MG tablet    Sig: Take 0.5-1 tablets (12.5-25 mg total) by mouth every 8 (eight) hours as needed for nausea or vomiting.    Dispense:  20 tablet    Refill:  0   chlorpheniramine-HYDROcodone (TUSSIONEX PENNKINETIC ER) 10-8 MG/5ML    Sig: Take 5 mLs by mouth every 12 (twelve) hours as needed for cough.    Dispense:  140 mL    Refill:  0    Follow up plan: Return if symptoms worsen or fail to improve.   [name], [facility] 01/24/2022, 9:59 AM

## 2022-01-24 NOTE — Patient Instructions (Signed)
° °  Please schedule a Follow-up Appointment to: No follow-ups on file. ° °If you have any other questions or concerns, please feel free to call the office or send a message through MyChart. You may also schedule an earlier appointment if necessary. ° °Additionally, you may be receiving a survey about your experience at our office within a few days to 1 week by e-mail or mail. We value your feedback. ° °Zeenat Jeanbaptiste, DO °[facility] °

## 2022-01-24 NOTE — Telephone Encounter (Signed)
Requested medication (s) are due for refill today: yes  Requested medication (s) are on the active medication list: yes  Last refill:  07/09/21  Future visit scheduled: no  Notes to clinic:  Unable to refill per protocol, medication not assigned to the refill protocol.      Requested Prescriptions  Pending Prescriptions Disp Refills   TRELEGY ELLIPTA 100-62.5-25 MCG/ACT AEPB [Pharmacy Med Name: TRELEGY ELLIPTA 100-62.5-25 MCG/ACT] 60 each 6    Sig: INHALE 1 PUFF BY MOUTH ONCE DAILY     There is no refill protocol information for this order

## 2022-01-25 LAB — CULTURE, BLOOD (ROUTINE X 2)
Culture: NO GROWTH
Culture: NO GROWTH
Special Requests: ADEQUATE
Special Requests: ADEQUATE

## 2022-01-28 ENCOUNTER — Telehealth: Payer: Self-pay

## 2022-01-28 DIAGNOSIS — K219 Gastro-esophageal reflux disease without esophagitis: Secondary | ICD-10-CM

## 2022-01-28 MED ORDER — OMEPRAZOLE 40 MG PO CPDR: 40.0000 mg | DELAYED_RELEASE_CAPSULE | Freq: Every day | ORAL | 1 refills | Status: DC

## 2022-01-28 NOTE — Telephone Encounter (Signed)
Pt states she needs a refill of omeprazole 40mg  to [facility].  Thanks,   -Mickel Baas

## 2022-01-30 ENCOUNTER — Encounter: Payer: Self-pay | Admitting: Dermatology

## 2022-02-07 ENCOUNTER — Ambulatory Visit (INDEPENDENT_AMBULATORY_CARE_PROVIDER_SITE_OTHER): Payer: 59 | Admitting: Internal Medicine

## 2022-02-07 ENCOUNTER — Other Ambulatory Visit: Payer: Self-pay

## 2022-02-07 ENCOUNTER — Ambulatory Visit (INDEPENDENT_AMBULATORY_CARE_PROVIDER_SITE_OTHER): Payer: 59

## 2022-02-07 ENCOUNTER — Encounter: Payer: Self-pay | Admitting: Internal Medicine

## 2022-02-07 VITALS — BP 98/70 | HR 120 | Ht 66.5 in | Wt 131.0 lb

## 2022-02-07 DIAGNOSIS — J449 Chronic obstructive pulmonary disease, unspecified: Secondary | ICD-10-CM

## 2022-02-07 DIAGNOSIS — I201 Angina pectoris with documented spasm: Secondary | ICD-10-CM

## 2022-02-07 DIAGNOSIS — R079 Chest pain, unspecified: Secondary | ICD-10-CM | POA: Diagnosis not present

## 2022-02-07 DIAGNOSIS — R000 Tachycardia, unspecified: Secondary | ICD-10-CM

## 2022-02-07 DIAGNOSIS — I509 Heart failure, unspecified: Secondary | ICD-10-CM

## 2022-02-07 DIAGNOSIS — I5022 Chronic systolic (congestive) heart failure: Secondary | ICD-10-CM

## 2022-02-07 LAB — ECHOCARDIOGRAM COMPLETE
AR max vel: 2.43 cm2
AV Area VTI: 2.3 cm2
AV Area mean vel: 2.34 cm2
AV Mean grad: 4 mmHg
AV Peak grad: 6.6 mmHg
Ao pk vel: 1.28 m/s
Area-P 1/2: 6.37 cm2
Calc EF: 38.9 %
Height: 66.5 in
S' Lateral: 3.4 cm
Single Plane A2C EF: 37.5 %
Single Plane A4C EF: 38.9 %
Weight: 2096 oz

## 2022-02-07 MED ORDER — PERFLUTREN LIPID MICROSPHERE
1.0000 mL | INTRAVENOUS | Status: AC | PRN
  Administered 2022-02-07: 2 mL via INTRAVENOUS

## 2022-02-07 MED ORDER — COLCHICINE 0.6 MG PO TABS: 0.6000 mg | ORAL_TABLET | Freq: Two times a day (BID) | ORAL | 0 refills | Status: DC

## 2022-02-07 NOTE — Patient Instructions (Signed)
Medication Instructions:   Your physician has recommended you make the following change in your medication:   START Colchicine 0.6 mg TWICE daily - If you begin to have GI upset and/or diarrhea, then DECREASE to once DAILY  *If you need a refill on your cardiac medications before your next appointment, please call your pharmacy*   Lab Work:  Today: CBC w/ diff, CMET, Magnesium, TSH, ESR, CRP, ANA  If you have labs (blood work) drawn today and your tests are completely normal, you will receive your results only by: [place] (if you have MyChart) OR A paper copy in the mail If you have any lab test that is abnormal or we need to change your treatment, we will call you to review the results.   Testing/Procedures:  Your physician has requested that you have an echocardiogram TODAY at 3 PM in our office. Echocardiography is a painless test that uses sound waves to create images of your heart. It provides your doctor with information about the size and shape of your heart and how well your hearts chambers and valves are working. This procedure takes approximately one hour. There are no restrictions for this procedure.   Follow-Up: At [facility], you and your health needs are our priority.  As part of our continuing mission to provide you with exceptional heart care, we have created designated Provider Care Teams.  These Care Teams include your primary Cardiologist (physician) and Advanced Practice Providers (APPs -  Physician Assistants and Nurse Practitioners) who all work together to provide you with the care you need, when you need it.  We recommend signing up for the patient portal called "MyChart".  Sign up information is provided on this After Visit Summary.  MyChart is used to connect with patients for Virtual Visits (Telemedicine).  Patients are able to view lab/test results, encounter notes, upcoming appointments, etc.  Non-urgent messages can be sent to your provider as  well.   To learn more about what you can do with MyChart, go to [email or web address].    Your next appointment:   2 - 3  week(s)  The format for your next appointment:   In Person  Provider:   You may see [name], MD or one of the following Advanced Practice Providers on your designated Care Team:   Murray Hodgkins, NP Christell Faith, PA-C Cadence Kathlen Mody, [place]

## 2022-02-07 NOTE — Progress Notes (Signed)
Follow-up Outpatient Visit Date: 02/07/2022  Primary Care Provider: Olin Hauser, DO [place] [id number]  Chief Complaint: Heartburn and fatigue  HPI:  Caroline Conley is a 56 y.o. female with history of nonischemic cardiomyopathy and LBBB, palpitations, atypical chest pain with vasospasm on catheterization complicated by iatrogenic ventricular fibrillation with injection of RCA (06/2021) and radial artery occlusion, prior tobacco use, and basal cell carcinoma, who presents for follow-up of recent hospitalization due to sepsis.  I saw her 2 weeks ago in the office, at which time she was feeling well.  We discussed increasing metoprolol succinate to 25 mg daily if her blood pressure was consistently above 120/60 at home.  She presented to the [facility] emergency department the next day with chest pain and concern for an allergic reaction.  She was discharged home but returned 2 days later with nausea, vomiting, substernal chest pain rating to the throat, and ultimate diagnosis of sepsis.  She was also treated for a COPD exacerbation and pneumonia.  Today, Caroline Conley reports that she is not feeling much better.  She still has daily chest pain that she describes as heartburn.  It is not related to activity, position, deep inspiration, or eating.  She denies shortness of breath but is very tired with no energy.  She has noticed more palpitations and lightheadedness.  She endorses poor appetite as well as less fluid intake.  --------------------------------------------------------------------------------------------------  Cardiovascular History & Procedures: Cardiovascular Problems: Tachycardia and palpitations Atypical chest pain Iatrogenic ventricular fibrillation during catheterization Right radial artery occlusion (following diagnostic catheterization in 06/2021)   Risk Factors: Tobacco use   Cath/PCI: LHC (07/02/2021): Coronary vasospasm with 30 to 40% stenosis of the proximal  RCA, which resolved with intracoronary NTG.  Otherwise, no angiographically significant CAD.  Mildly elevated left ventricular filling pressure (LVEDP 15-20 mmHg).  Ventricular fibrillation precipitated with intracoronary NTG administration into the RCA, successfully treated with defibrillation x1. LHC (2015): Report not available.  Images personally reviewed, demonstrating no significant CAD and normal LVEF.   CV Surgery: None   EP Procedures and Devices: 14-day event monitor (03/04/2019): Limited study due to significant artifact.  Usable tracings show predominantly sinus rhythm with rare PAC's and PVC's.  Two brief atrial runs noted (up to 5 beats). 48-hour Holter monitor (08/06/2018): Predominantly sinus rhythm with rare PAC's and PVC's, as well as brief atrial runs.  Average rate during the monitoring period was elevated (106 bpm).   Non-Invasive Evaluation(s): Limited TTE (07/02/2021): Normal LV size and wall thickness.  LVEF 35-40% with global hypokinesis.  Normal RV size and function.  Mild MR.  No significant change from prior echo on 06/22/2021. Pharmacologic MPI (06/25/2021): Moderate size, mild in severity, mid anterior and apical reversible defect consistent with possible LAD territory ischemia.  LVEF 55-65%.  Intermediate risk study. TTE (06/22/2021): Normal LV size and wall thickness.  LVEF 40% with grade 1 diastolic dysfunction and global longitudinal strain of -13.7%.  Global hypokinesis noted.  Normal RV size and function.  Normal biatrial size.  Mild to moderate mitral regurgitation. TTE (08/04/2018): Normal LV size and wall thickness.  LVEF 60-65% with normal wall motion.  Grade 1 diastolic dysfunction.  Normal RV size and function.  No significant valvular abnormality.  Recent CV Pertinent Labs: Lab Results  Component Value Date   CHOL 169 07/29/2017   HDL 35 (L) 07/29/2017   LDLCALC 85 07/29/2017   TRIG 243 (H) 07/29/2017   CHOLHDL 4.8 07/29/2017   INR 1.0  01/21/2022   K 4.0  01/21/2022   K 3.6 09/23/2014   MG 1.8 01/20/2022   BUN 8 01/21/2022   BUN 6 07/12/2021   BUN 5 (L) 09/23/2014   CREATININE 1.03 (H) 01/21/2022   CREATININE 1.16 (H) 12/31/2018    Past medical and surgical history were reviewed and updated in EPIC.  Current Meds  Medication Sig   albuterol (PROVENTIL) (2.5 MG/3ML) 0.083% nebulizer solution Take 2.5 mg by nebulization every 6 (six) hours as needed for shortness of breath or wheezing.   magic mouthwash w/lidocaine SOLN Swish, gargle, and spit one to two teaspoonfuls every six hours as needed. Shake well before using.   metoprolol succinate (TOPROL-XL) 25 MG 24 hr tablet Take 0.5 tablets (12.5 mg total) by mouth daily.   Misc. Devices (PULSE OXIMETER FOR FINGER) MISC 1 Device by Does not apply route as needed (for cough, dyspnea). Pulse oximeter for finger for checking oxygen saturation in COPD   omeprazole (PRILOSEC) 40 MG capsule Take 1 capsule (40 mg total) by mouth daily before breakfast.   oxyCODONE-acetaminophen (PERCOCET) 10-325 MG tablet Take 1 tablet by mouth every 4 (four) hours.   PROAIR HFA 108 (90 Base) MCG/ACT inhaler Inhale 2 puffs into the lungs every 4 (four) hours as needed for wheezing or shortness of breath.   promethazine (PHENERGAN) 25 MG tablet Take 0.5-1 tablets (12.5-25 mg total) by mouth every 8 (eight) hours as needed for nausea or vomiting.   roflumilast (DALIRESP) 500 MCG TABS tablet Take 1 tablet (500 mcg total) by mouth daily.   TRELEGY ELLIPTA 100-62.5-25 MCG/ACT AEPB INHALE 1 PUFF BY MOUTH ONCE DAILY    Allergies: 2,4-d dimethylamine; Morphine and related; and Vicodin [hydrocodone-acetaminophen]  Social History   Tobacco Use   Smoking status: Former    Packs/day: 1.00    Years: 30.00    Pack years: 30.00    Types: Cigarettes    Quit date: 03/02/2018    Years since quitting: 3.9   Smokeless tobacco: Never   Tobacco comments:    0.5 to 1 ppd >30 years  Vaping Use   Vaping Use: Never used   Substance Use Topics   Alcohol use: No   Drug use: Yes    Types: Oxycodone    Family History  Problem Relation Age of Onset   Diabetes Mother    High blood pressure Mother    Gout Mother    Cancer Father    Stroke Father    Arthritis/Rheumatoid Father    Diabetes Father    High blood pressure Father    Heart failure Father    Heart failure Brother    Diabetes Brother     Review of Systems: A 12-system review of systems was performed and was negative except as noted in the HPI.  --------------------------------------------------------------------------------------------------  Physical Exam: BP 98/70 (BP Location: Left Arm, Patient Position: Sitting, Cuff Size: Normal)    Pulse (!) 120    Ht 5' 6.5" (1.689 m)    Wt 131 lb (59.4 kg)    SpO2 97%    BMI 20.83 kg/m   General:  NAD. Neck: No JVD or HJR. Lungs: Mildly diminished breath sounds throughout with scattered wheezes and rhonchi. Heart: Tachycardic but regular without murmurs, rubs, or gallops. Abdomen: Soft, nontender, nondistended. Extremities: No lower extremity edema.  Right radial pulse remains absent.  EKG: Sinus tachycardia with left bundle branch block.  No significant change since 01/20/2022.  Lab Results  Component Value Date   WBC  8.2 01/21/2022   HGB 12.1 01/21/2022   HCT 35.1 (L) 01/21/2022   MCV 100.9 (H) 01/21/2022   PLT 262 01/21/2022    Lab Results  Component Value Date   NA 137 01/21/2022   K 4.0 01/21/2022   CL 104 01/21/2022   CO2 27 01/21/2022   BUN 8 01/21/2022   CREATININE 1.03 (H) 01/21/2022   GLUCOSE 92 01/21/2022   ALT 10 01/20/2022    Lab Results  Component Value Date   CHOL 169 07/29/2017   HDL 35 (L) 07/29/2017   LDLCALC 85 07/29/2017   TRIG 243 (H) 07/29/2017   CHOLHDL 4.8 07/29/2017    --------------------------------------------------------------------------------------------------  ASSESSMENT AND PLAN: Chronic HFrEF due to nonischemic cardiomyopathy: Caroline Conley  reports significant fatigue that is likely multifactorial.  This started shortly after our visit earlier this month and coincided with possible allergic reaction.  We will repeat an echocardiogram today to reassess her LVEF and exclude enlarging pericardial effusion given report of small pericardial effusion on CTA of the chest earlier this month.  Due to her borderline low blood pressure, I am reluctant to increase metoprolol or add other GDMT.  We may need to consider referral to the advanced heart failure clinic and or EP for further evaluation including consideration of CRT if her EF does not improve.  Chest pain and tachycardia: Chest pain is atypical and seems most consistent with a noncardiac etiology such as GERD.  Catheterization last year showed catheter induced vasospasm but otherwise no significant CAD.  Ms. Matton has not experienced any improvement with omeprazole.  I wonder if she could have an element of pericarditis given her small pericardial effusion on recent CTA.  Her EKG does not allow for assessment of ST changes that could be seen with pericarditis due to chronic LBBB.  We will obtain an echocardiogram today.  I will also check a CBC, CMP, magnesium level, TSH, ESR, CRP, and ANA.  We will begin an empiric course of colchicine 0.6 mg twice daily, though this may need to be reduced to daily dosing if Ms. Calixte develops GI symptoms.  Coronary vasospasm: Chest pain not consistent with angina.  Defer pharmacotherapy at this time.  COPD: Wheezing noted on examination today.  Question if COPD is contributing to her constellation of symptoms.  She was treated for exacerbation and possible pneumonia earlier this month.  Continue inhaled therapies per pulmonary.  Right radial artery occlusion: Pulse remains absent.  No further work-up, given lack of symptoms.  Follow-up: Return to clinic in 2-3 weeks.  [name], MD 02/07/2022 10:19 AM

## 2022-02-08 LAB — CBC WITH DIFFERENTIAL/PLATELET
Basophils Absolute: 0.1 10*3/uL (ref 0.0–0.2)
Basos: 1 %
EOS (ABSOLUTE): 0.2 10*3/uL (ref 0.0–0.4)
Eos: 3 %
Hematocrit: 36.6 % (ref 34.0–46.6)
Hemoglobin: 12.8 g/dL (ref 11.1–15.9)
Immature Grans (Abs): 0.2 10*3/uL — ABNORMAL HIGH (ref 0.0–0.1)
Immature Granulocytes: 3 %
Lymphocytes Absolute: 1.9 10*3/uL (ref 0.7–3.1)
Lymphs: 22 %
MCH: 34.1 pg — ABNORMAL HIGH (ref 26.6–33.0)
MCHC: 35 g/dL (ref 31.5–35.7)
MCV: 98 fL — ABNORMAL HIGH (ref 79–97)
Monocytes Absolute: 1 10*3/uL — ABNORMAL HIGH (ref 0.1–0.9)
Monocytes: 12 %
Neutrophils Absolute: 5.2 10*3/uL (ref 1.4–7.0)
Neutrophils: 59 %
Platelets: 294 10*3/uL (ref 150–450)
RBC: 3.75 x10E6/uL — ABNORMAL LOW (ref 3.77–5.28)
RDW: 12.5 % (ref 11.7–15.4)
WBC: 8.6 10*3/uL (ref 3.4–10.8)

## 2022-02-08 LAB — COMPREHENSIVE METABOLIC PANEL
ALT: 25 IU/L (ref 0–32)
AST: 30 IU/L (ref 0–40)
Albumin/Globulin Ratio: 1.6 (ref 1.2–2.2)
Albumin: 3.8 g/dL (ref 3.8–4.9)
Alkaline Phosphatase: 143 IU/L — ABNORMAL HIGH (ref 44–121)
BUN/Creatinine Ratio: 6 — ABNORMAL LOW (ref 9–23)
BUN: 7 mg/dL (ref 6–24)
Bilirubin Total: 0.2 mg/dL (ref 0.0–1.2)
CO2: 25 mmol/L (ref 20–29)
Calcium: 9.8 mg/dL (ref 8.7–10.2)
Chloride: 99 mmol/L (ref 96–106)
Creatinine, Ser: 1.08 mg/dL — ABNORMAL HIGH (ref 0.57–1.00)
Globulin, Total: 2.4 g/dL (ref 1.5–4.5)
Glucose: 109 mg/dL — ABNORMAL HIGH (ref 70–99)
Potassium: 4.5 mmol/L (ref 3.5–5.2)
Sodium: 136 mmol/L (ref 134–144)
Total Protein: 6.2 g/dL (ref 6.0–8.5)
eGFR: 60 mL/min/{1.73_m2} (ref >59)

## 2022-02-08 LAB — C-REACTIVE PROTEIN: CRP: 31 mg/L — ABNORMAL HIGH (ref 0–10)

## 2022-02-08 LAB — TSH: TSH: 0.697 u[IU]/mL (ref 0.450–4.500)

## 2022-02-08 LAB — MAGNESIUM: Magnesium: 2.1 mg/dL (ref 1.6–2.3)

## 2022-02-08 LAB — ANA: Anti Nuclear Antibody (ANA): NEGATIVE

## 2022-02-08 LAB — SEDIMENTATION RATE: Sed Rate: 38 mm/hr (ref 0–40)

## 2022-02-12 ENCOUNTER — Ambulatory Visit: Payer: 59 | Admitting: Dermatology

## 2022-02-14 ENCOUNTER — Other Ambulatory Visit: Payer: 59

## 2022-02-15 ENCOUNTER — Ambulatory Visit (INDEPENDENT_AMBULATORY_CARE_PROVIDER_SITE_OTHER): Payer: 59 | Admitting: Family Medicine

## 2022-02-15 ENCOUNTER — Ambulatory Visit: Payer: 59 | Admitting: Internal Medicine

## 2022-02-15 ENCOUNTER — Encounter: Payer: Self-pay | Admitting: Family Medicine

## 2022-02-15 ENCOUNTER — Other Ambulatory Visit: Payer: Self-pay

## 2022-02-15 VITALS — BP 96/66 | HR 107 | Ht 66.5 in | Wt 130.4 lb

## 2022-02-15 DIAGNOSIS — M7551 Bursitis of right shoulder: Secondary | ICD-10-CM | POA: Diagnosis not present

## 2022-02-15 DIAGNOSIS — Z9889 Other specified postprocedural states: Secondary | ICD-10-CM

## 2022-02-15 DIAGNOSIS — M25511 Pain in right shoulder: Secondary | ICD-10-CM | POA: Diagnosis not present

## 2022-02-15 MED ORDER — METHYLPREDNISOLONE ACETATE 40 MG/ML IJ SUSP
40.0000 mg | Freq: Once | INTRAMUSCULAR | Status: AC
  Administered 2022-02-15: 40 mg via INTRA_ARTICULAR

## 2022-02-15 MED ORDER — LIDOCAINE HCL (PF) 1 % IJ SOLN
4.0000 mL | Freq: Once | INTRAMUSCULAR | Status: AC
  Administered 2022-02-15: 4 mL

## 2022-02-15 NOTE — Patient Instructions (Addendum)
Thank you for coming to the office today. ? ?You received a Right Shoulder Joint steroid injection today. ?- Lidocaine numbing medicine may ease the pain initially for a few hours until it wears off ?- As discussed, you may experience a "steroid flare" this evening or within 24-48 hours, anytime medicine is injected into an inflamed joint it can cause the pain to get worse temporarily ?- Everyone responds differently to these injections, it depends on the patient and the severity of the joint problem, it may provide anywhere from days to weeks, to months of relief. Ideal response is >6 months relief ?- Try to take it easy for next 1-2 days, avoid over activity and strain on joint (limit lifting for shoulder) ?- Recommend the following: ?  - For swelling - rest, compression sleeve / ACE wrap, elevation, and ice packs as needed for first few days ?  - For pain in future may use heating pad or moist heat as needed ? ?Please schedule a Follow-up Appointment to: Return if symptoms worsen or fail to improve. ? ?If you have any other questions or concerns, please feel free to call the office or send a message through [place]. You may also schedule an earlier appointment if necessary. ? ?Additionally, you may be receiving a survey about your experience at our office within a few days to 1 week by e-mail or mail. We value your feedback. ? ?[name], DO ?[place] ?

## 2022-02-15 NOTE — Progress Notes (Signed)
? ?Subjective:  ? ? Patient ID: Caroline Conley, female    DOB: 1966/12/09, 56 y.o.   MRN: [id number] ? ?Caroline Conley is a 56 y.o. female presenting on 02/15/2022 for Shoulder Pain ? ? ?HPI ? ?FOLLOW-UP SHOULDER PAIN / history of chronic R frozen shoulder and prior rotator cuff surgery ? ?- Last visit with me for this problem with steroid injection R shoulder back in 01/01/18 and 03/2021, same problem, see prior notes for background information. ?- Interval update with improved R shoulder pain from bursitis for past 3-4 years. Gradual worsening again. ?- Today patient reports ready for new injection. Known chronic problem for few years after her R shoulder surgery, seems to hurt worse post-op than before, has had frozen shoulder, in past received steroid subacromial injections into bursa with good results. ?- Requesting new injection today, she was followed by [facility] in [place] in past. ?- Taking NSAID and chronic pain meds percocet with some relief ?- Her range of motion has never improved post op due to frozen shoulder ?- No new fall or injury ?- Denies any redness, bruising, swelling, numbness, tingling, weakness ? ?She has delayed R Shoulder repair surgery, now planning to schedule for later this year. ? ? ?Depression screen [facility] 2/9 01/24/2022 12/19/2020 11/24/2019  ?Decreased Interest 0 0 0  ?Down, Depressed, Hopeless 0 0 0  ?PHQ - 2 Score 0 0 0  ?Altered sleeping 0 - 0  ?Tired, decreased energy 0 - 0  ?Change in appetite 0 - 0  ?Feeling bad or failure about yourself  0 - 0  ?Trouble concentrating 0 - 0  ?Moving slowly or fidgety/restless 0 - 0  ?Suicidal thoughts 0 - 0  ?PHQ-9 Score 0 - 0  ?Difficult doing work/chores Not difficult at all - -  ?Some recent data might be hidden  ? ? ?Social History  ? ?Tobacco Use  ? Smoking status: Former  ?  Packs/day: 1.00  ?  Years: 30.00  ?  Pack years: 30.00  ?  Types: Cigarettes  ?  Quit date: 03/02/2018  ?  Years since quitting: 3.9  ? Smokeless tobacco: Never  ? Tobacco  comments:  ?  0.5 to 1 ppd >30 years  ?Vaping Use  ? Vaping Use: Never used  ?Substance Use Topics  ? Alcohol use: No  ? Drug use: Yes  ?  Types: Oxycodone  ? ? ?Review of Systems ?Per HPI unless specifically indicated above ? ?   ?Objective:  ?  ?BP 96/66   Pulse (!) 107   Ht 5' 6.5" (1.689 m)   Wt 130 lb 6.4 oz (59.1 kg)   SpO2 98%   BMI 20.73 kg/m?   ?Wt Readings from Last 3 Encounters:  ?02/15/22 130 lb 6.4 oz (59.1 kg)  ?02/07/22 131 lb (59.4 kg)  ?01/24/22 130 lb 6.4 oz (59.1 kg)  ?  ?Physical Exam ?Vitals and nursing note reviewed.  ?Constitutional:   ?   General: She is not in acute distress. ?   Appearance: Normal appearance. She is well-developed. She is not diaphoretic.  ?   Comments: Well-appearing, comfortable, cooperative  ?HENT:  ?   Head: Normocephalic and atraumatic.  ?Eyes:  ?   General:     ?   Right eye: No discharge.     ?   Left eye: No discharge.  ?   Conjunctiva/sclera: Conjunctivae normal.  ?Cardiovascular:  ?   Rate and Rhythm: Normal rate.  ?Pulmonary:  ?  Effort: Pulmonary effort is normal.  ?Musculoskeletal:  ?   Comments: Right Shoulder reduced range of motion but has intact forward flex to 90* and some abduction.  ?Skin: ?   General: Skin is warm and dry.  ?   Findings: No erythema or rash.  ?Neurological:  ?   Mental Status: She is alert and oriented to person, place, and time.  ?Psychiatric:     ?   Mood and Affect: Mood normal.     ?   Behavior: Behavior normal.     ?   Thought Content: Thought content normal.  ?   Comments: Well groomed, good eye contact, normal speech and thoughts  ? ? ?________________________________________________________ ?PROCEDURE NOTE ?Date: 02/15/22 ?Right [place] subacromial injection ?Discussed benefits and risks (including pain, bleeding, infection, steroid flare). ?Verbal consent given by patient. ?Medication:  1 cc Depo-medrol 40mg  and 4 cc Lidocaine 1% without epi ?Time Out taken  ?Landmarks identified. Area cleansed with alcohol wipes. Using 21  gauge and 1, 1/2 inch needle, Right subacromial bursa space was injected (with above listed medication) via posterior approach cold spray used for superficial anesthetic. Sterile bandage placed. Patient tolerated procedure well without bleeding or paresthesias. No complications. ? ? ? ?Results for orders placed or performed in visit on 02/07/22  ?ECHOCARDIOGRAM COMPLETE  ?Result Value Ref Range  ? Weight 2,096 oz  ? Height 66.5 in  ? BP 98/70 mmHg  ? AR max vel 2.43 cm2  ? AV Peak grad 6.6 mmHg  ? Ao pk vel 1.28 m/s  ? S' Lateral 3.40 cm  ? Area-P 1/2 6.37 cm2  ? AV Area VTI 2.30 cm2  ? AV Mean grad 4.0 mmHg  ? Single Plane A4C EF 38.9 %  ? Single Plane A2C EF 37.5 %  ? Calc EF 38.9 %  ? AV Area mean vel 2.34 cm2  ? ?   ?Assessment & Plan:  ? ?Problem List Items Addressed This Visit   ? ? Shoulder joint pain  ? Chronic bursitis of right shoulder - Primary  ? ?Other Visit Diagnoses   ? ? History of repair of right rotator cuff      ? ?  ?  ?Consistent with subacute on chronic R-shoulder bursitis with known rotator cuff tendinopathy, s/p repair and frozen shoulder complication ?- worse with repetitive activities, triggers bursitis ?- limited ROM significantly. ?- Last imaging L shoulder x-ray she has had prior R shoulder imaging > 3-4 years ago at [facility] ?  ?Last steroid injection R shoulder subacromial done by me 12/2017, and 03/2021 ?  ?Plan: ?Right shoulder subacromial steroid injection performed today, see procedure note for details. ?Continue current meds for pain management no med changes ?  ?Relative rest but keep shoulder mobile, demonstrated ROM exercises, avoid heavy lifting ?  ?Return to Ortho as planned in future for surgery ? ?Meds ordered this encounter  ?Medications  ? methylPREDNISolone acetate (DEPO-MEDROL) injection 40 mg  ? lidocaine (PF) (XYLOCAINE) 1 % injection 4 mL  ? ? ? ? ?Follow up plan: ?Return if symptoms worsen or fail to improve. ? ? ?[name], DO ?[facility] ?[facility] ?02/15/2022, 11:23 AM ?

## 2022-02-21 ENCOUNTER — Ambulatory Visit: Payer: 59 | Admitting: Nurse Practitioner

## 2022-02-25 ENCOUNTER — Ambulatory Visit: Payer: Self-pay

## 2022-02-25 NOTE — Telephone Encounter (Signed)
Chief Complaint: Wheezing ?Symptoms: SOB only with coughing, yellow sputum with coughing ?Frequency: x 3 days ?Pertinent Negatives: Patient denies other symptoms ?Disposition: '[]'$ ED /'[]'$ Urgent Care (no appt availability in office) / '[x]'$ Appointment(In office/virtual)/ '[]'$  [facility]/ '[]'$ Home Care/ '[]'$ Refused Recommended Disposition /'[]'$ [place] [facility]/ '[]'$  Follow-up with PCP ?Additional Notes: Patient only requested to see Dr. Parks Ranger, says she will take the first available with him even if it's tomorrow and she requested virtual visit. Appointment tomorrow at 1140 Virtual. ? ? ? ?Reason for Disposition ? Continuous (nonstop) coughing interferes with work, school, or sleeping ? ?Answer Assessment - Initial Assessment Questions ?1. RESPIRATORY STATUS: "Describe your breathing?" (e.g., wheezing, shortness of breath, unable to speak, severe coughing)  ?    Wheezing, SOB ?2. ONSET: "When did this breathing problem begin?"  ?    3 days ago ?3. PATTERN "Does the difficult breathing come and go, or has it been constant since it started?"  ?    Yes ?4. SEVERITY: "How bad is your breathing?" (e.g., mild, moderate, severe)  ?  - MILD: No SOB at rest, mild SOB with walking, speaks normally in sentences, can lie down, no retractions, pulse < 100.  ?  - MODERATE: SOB at rest, SOB with minimal exertion and prefers to sit, cannot lie down flat, speaks in phrases, mild retractions, audible wheezing, pulse 100-120.  ?  - SEVERE: Very SOB at rest, speaks in single words, struggling to breathe, sitting hunched forward, retractions, pulse > 120  ?    Only when coughing really hard ?5. RECURRENT SYMPTOM: "Have you had difficulty breathing before?" If Yes, ask: "When was the last time?" and "What happened that time?"  ?    Yes has COPD ?6. CARDIAC HISTORY: "Do you have any history of heart disease?" (e.g., heart attack, angina, bypass surgery, angioplasty)  ?    N/A ?7. LUNG HISTORY: "Do you have any history of lung  disease?"  (e.g., pulmonary embolus, asthma, emphysema) ?    COPD ?8. CAUSE: "What do you think is causing the breathing problem?"  ?    COPD flare up ?9. OTHER SYMPTOMS: "Do you have any other symptoms? (e.g., dizziness, runny nose, cough, chest pain, fever) ?    Cough with yellow sputum ?10. O2 SATURATION MONITOR:  "Do you use an oxygen saturation monitor (pulse oximeter) at home?" If Yes, "What is your reading (oxygen level) today?" "What is your usual oxygen saturation reading?" (e.g., 95%) ?      Yes 97% ?11. PREGNANCY: "Is there any chance you are pregnant?" "When was your last menstrual period?" ?      N/A ?12. TRAVEL: "Have you traveled out of the country in the last month?" (e.g., travel history, exposures) ?      N/A ? ?Protocols used: Breathing Difficulty-A-AH ? ?

## 2022-02-26 ENCOUNTER — Encounter: Payer: Self-pay | Admitting: Family Medicine

## 2022-02-26 ENCOUNTER — Telehealth (INDEPENDENT_AMBULATORY_CARE_PROVIDER_SITE_OTHER): Payer: 59 | Admitting: Family Medicine

## 2022-02-26 ENCOUNTER — Other Ambulatory Visit: Payer: Self-pay

## 2022-02-26 VITALS — Ht 66.5 in | Wt 130.0 lb

## 2022-02-26 DIAGNOSIS — J441 Chronic obstructive pulmonary disease with (acute) exacerbation: Secondary | ICD-10-CM

## 2022-02-26 MED ORDER — HYDROCOD POLI-CHLORPHE POLI ER 10-8 MG/5ML PO SUER: 5.0000 mL | Freq: Two times a day (BID) | ORAL | 0 refills | Status: DC | PRN

## 2022-02-26 MED ORDER — PREDNISONE 20 MG PO TABS: ORAL_TABLET | ORAL | 0 refills | Status: DC

## 2022-02-26 MED ORDER — DOXYCYCLINE HYCLATE 100 MG PO TABS: 100.0000 mg | ORAL_TABLET | Freq: Two times a day (BID) | ORAL | 0 refills | Status: DC

## 2022-02-26 NOTE — Patient Instructions (Signed)
° °  Please schedule a Follow-up Appointment to: No follow-ups on file. ° °If you have any other questions or concerns, please feel free to call the office or send a message through MyChart. You may also schedule an earlier appointment if necessary. ° °Additionally, you may be receiving a survey about your experience at our office within a few days to 1 week by e-mail or mail. We value your feedback. ° °Nickisha Hum, DO °[facility] °

## 2022-02-26 NOTE — Progress Notes (Signed)
Virtual Visit via Telephone ?The purpose of this virtual visit is to provide medical care while limiting exposure to the novel coronavirus (COVID19) for both patient and office staff. ? ?Consent was obtained for phone visit:  Yes.   ?Answered questions that patient had about telehealth interaction:  Yes.   ?I discussed the limitations, risks, security and privacy concerns of performing an evaluation and management service by telephone. I also discussed with the patient that there may be a patient responsible charge related to this service. The patient expressed understanding and agreed to proceed. ? ?Patient Location: Home ?Provider Location: [facility] (Office) ? ?Participants in virtual visit: ?- Patient: Caroline Conley ?- CMA: Orinda Kenner, CMA ?- Provider: Dr Parks Ranger ? ?---------------------------------------------------------------------- ?Chief Complaint  ?Patient presents with  ? Cough  ? Shortness of Breath  ? ? ?S: Reviewed CMA documentation. I have called patient and gathered additional HPI as follows: ? ?ACUTE COPD FLARE ?Last visit for pneumonia 01/24/22 treated with levaquin, she did improve. Now had sick contact recently and now worsening flare COPD again. Previous was 11/2021 for COPD flare. ? ?Reports that symptoms started within past 1 week with thicker yellow sputum increased worsening cough and dyspnea ? ?Admits cough, shortness of breath ? ?Denies any known or suspected exposure to person with or possibly with COVID19. ? ?Denies any fevers, chills, sweats, body ache, sinus pain or pressure, headache, abdominal pain, diarrhea ? ?Past Medical History:  ?Diagnosis Date  ? Atypical chest pain   ? a. 2015 Cath ([facility]): Nl cors. Nl EF; b. 10/2018 MV: No ischemia/infarct. EF>65%.  ? Cancer ([place]) 01/2016  ? basal cell   ? Coronary vasospasm (HCC)   ? a. 06/2021 Cath: LM nl, LADnl, LCX large/nl, RCA 35p (spasm-->tx w/ IC NTG w/ resultant VF req defib).  ? Dysplastic nevus  02/02/2015  ? R upper back - excision  ? Dysplastic nevus 02/16/2015  ? L hip - excision  ? Dysplastic nevus 08/24/2015  ? L upper abdomen  ? Dysplastic nevus 08/11/2017  ? mid abdomen - excised  ? Dysplastic nevus 07/29/2017  ? Left neck  ? Emphysema of lung ([facility])   ? Gastritis   ? Hyperlipidemia   ? Melanoma ([place]) 08/24/2015  ? L buttocks - superficial spreading  ? NICM (nonischemic cardiomyopathy) ([facility])   ? a. 06/22/2021 Echo: EF 40%, ant/antseptal/septal HK. Gr1 DD. Nl RV fxn. Mild-mod MR; b. 06/2021 Cath: Nl cors w/ RCA vasospasm (35%). VF w/ IC NTG admin req defib; c. 07/02/2021 Echo: EF 35-40%. Glob HK. Mild MR.  ? Occlusion of right radial artery ([facility])   ? a. 06/2021 s/p cardiac cath-->u/s w/ R radial occlusion from puncture extending up entire forearm-->Lovenox '1mg'$ /kg BID x 4 wks; b. 07/2021 U/S: Thrombus in prox Radial arteray w/ minimal reconstitution of flow - improved w/ multiphasic waveforms in R brachial artery.  ? Palpitations   ? a. 07/2018 48h Holter: predominantly RSR, avg rate 106 (69-166 bpm), Rare PAC's and occas PVC's. 2 atrial runs up to 5 beats. No sustained arrhythmias or prolonged pauses. No symptoms reported; b. 07/2021 Zio: Avg HR 102 bpm, intermittent bundle branch block, rare PACs and PVCs, 9 atrial runs, longest 13.7 secs w/ max HR of 226 bpm.  ? Wears dentures   ? full upper and lower  ? ?Social History  ? ?Tobacco Use  ? Smoking status: Former  ?  Packs/day: 1.00  ?  Years: 30.00  ?  Pack years: 30.00  ?  Types: Cigarettes  ?  Quit date: 03/02/2018  ?  Years since quitting: 3.9  ? Smokeless tobacco: Never  ? Tobacco comments:  ?  0.5 to 1 ppd >30 years  ?Vaping Use  ? Vaping Use: Never used  ?Substance Use Topics  ? Alcohol use: No  ? Drug use: Yes  ?  Types: Oxycodone  ? ? ?Current Outpatient Medications:  ?  chlorpheniramine-HYDROcodone (TUSSIONEX PENNKINETIC ER) 10-8 MG/5ML, Take 5 mLs by mouth every 12 (twelve) hours as needed for cough., Disp: 140 mL, Rfl: 0 ?  doxycycline (VIBRA-TABS)  100 MG tablet, Take 1 tablet (100 mg total) by mouth 2 (two) times daily. For 10 days. Take with full glass of water, stay upright 30 min after taking., Disp: 20 tablet, Rfl: 0 ?  predniSONE (DELTASONE) 20 MG tablet, Take 2 tablets daily ('40mg'$ ) for 4 days, take 1 tab daily ('20mg'$ ) for 4 days, take half tab daily ('10mg'$ ) for 2 days, Disp: 13 tablet, Rfl: 0 ?  albuterol (PROVENTIL) (2.5 MG/3ML) 0.083% nebulizer solution, Take 2.5 mg by nebulization every 6 (six) hours as needed for shortness of breath or wheezing., Disp: , Rfl:  ?  cimetidine (TAGAMET) 400 MG tablet, Take 400 mg by mouth 2 (two) times daily., Disp: , Rfl:  ?  colchicine 0.6 MG tablet, Take 1 tablet (0.6 mg total) by mouth 2 (two) times daily., Disp: 60 tablet, Rfl: 0 ?  magic mouthwash w/lidocaine SOLN, Swish, gargle, and spit one to two teaspoonfuls every six hours as needed. Shake well before using., Disp: 120 mL, Rfl: 0 ?  metoprolol succinate (TOPROL-XL) 25 MG 24 hr tablet, Take 0.5 tablets (12.5 mg total) by mouth daily., Disp: 45 tablet, Rfl: 3 ?  Misc. Devices (PULSE OXIMETER FOR FINGER) MISC, 1 Device by Does not apply route as needed (for cough, dyspnea). Pulse oximeter for finger for checking oxygen saturation in COPD, Disp: 1 each, Rfl: 0 ?  omeprazole (PRILOSEC) 40 MG capsule, Take 1 capsule (40 mg total) by mouth daily before breakfast., Disp: 90 capsule, Rfl: 1 ?  oxyCODONE-acetaminophen (PERCOCET) 10-325 MG tablet, Take 1 tablet by mouth every 4 (four) hours., Disp: , Rfl:  ?  PROAIR HFA 108 (90 Base) MCG/ACT inhaler, Inhale 2 puffs into the lungs every 4 (four) hours as needed for wheezing or shortness of breath., Disp: 8.5 g, Rfl: 3 ?  promethazine (PHENERGAN) 25 MG tablet, Take 0.5-1 tablets (12.5-25 mg total) by mouth every 8 (eight) hours as needed for nausea or vomiting., Disp: 20 tablet, Rfl: 0 ?  roflumilast (DALIRESP) 500 MCG TABS tablet, Take 1 tablet (500 mcg total) by mouth daily., Disp: 30 tablet, Rfl: 5 ?  TRELEGY ELLIPTA  100-62.5-25 MCG/ACT AEPB, INHALE 1 PUFF BY MOUTH ONCE DAILY, Disp: 60 each, Rfl: 6 ? ?Depression screen [facility] 2/9 01/24/2022 12/19/2020 11/24/2019  ?Decreased Interest 0 0 0  ?Down, Depressed, Hopeless 0 0 0  ?PHQ - 2 Score 0 0 0  ?Altered sleeping 0 - 0  ?Tired, decreased energy 0 - 0  ?Change in appetite 0 - 0  ?Feeling bad or failure about yourself  0 - 0  ?Trouble concentrating 0 - 0  ?Moving slowly or fidgety/restless 0 - 0  ?Suicidal thoughts 0 - 0  ?PHQ-9 Score 0 - 0  ?Difficult doing work/chores Not difficult at all - -  ?Some recent data might be hidden  ? ? ?GAD 7 : Generalized Anxiety Score 01/24/2022  ?Nervous, Anxious, on Edge 0  ?Control/stop worrying  0  ?Worry too much - different things 0  ?Trouble relaxing 0  ?Restless 0  ?Easily annoyed or irritable 0  ?Afraid - awful might happen 0  ?Total GAD 7 Score 0  ?Anxiety Difficulty Not difficult at all  ? ? ?-------------------------------------------------------------------------- ?O: No physical exam performed due to remote telephone encounter. ? ?Lab results reviewed. ? ?Recent Results (from the past 2160 hour(s))  ?Basic metabolic panel per protocol     Status: Abnormal  ? Collection Time: 01/03/22  2:25 PM  ?Result Value Ref Range  ? Sodium 137 135 - 145 mmol/L  ? Potassium 2.9 (L) 3.5 - 5.1 mmol/L  ? Chloride 106 98 - 111 mmol/L  ? CO2 24 22 - 32 mmol/L  ? Glucose, Bld 103 (H) 70 - 99 mg/dL  ?  Comment: Glucose reference range applies only to samples taken after fasting for at least 8 hours.  ? BUN 7 6 - 20 mg/dL  ? Creatinine, Ser 1.02 (H) 0.44 - 1.00 mg/dL  ? Calcium 9.4 8.9 - 10.3 mg/dL  ? GFR, Estimated >60 >60 mL/min  ?  Comment: (NOTE) ?Calculated using the CKD-EPI Creatinine Equation (2021) ?  ? Anion gap 7 5 - 15  ?  Comment: Performed at [facility], [address] [place], [place] [address]  ?CBC per protocol     Status: Abnormal  ? Collection Time: 01/03/22  2:25 PM  ?Result Value Ref Range  ? WBC 7.7 4.0 - 10.5 K/uL  ? RBC 3.59 (L)  3.87 - 5.11 MIL/uL  ? Hemoglobin 12.8 12.0 - 15.0 g/dL  ? HCT 35.5 (L) 36.0 - 46.0 %  ? MCV 98.9 80.0 - 100.0 fL  ? MCH 35.7 (H) 26.0 - 34.0 pg  ? MCHC 36.1 (H) 30.0 - 36.0 g/dL  ? RDW 12.7 11.5 - 15.5 %  ? Plate

## 2022-03-14 ENCOUNTER — Ambulatory Visit (INDEPENDENT_AMBULATORY_CARE_PROVIDER_SITE_OTHER): Payer: 59

## 2022-03-14 ENCOUNTER — Encounter: Payer: Self-pay | Admitting: Family Medicine

## 2022-03-14 ENCOUNTER — Ambulatory Visit (INDEPENDENT_AMBULATORY_CARE_PROVIDER_SITE_OTHER): Payer: 59 | Admitting: Family Medicine

## 2022-03-14 VITALS — BP 98/70 | Temp 98.1°F | Resp 18 | Ht 66.5 in | Wt 132.0 lb

## 2022-03-14 VITALS — BP 98/70 | HR 56 | Temp 98.1°F | Resp 18 | Ht 66.5 in | Wt 132.0 lb

## 2022-03-14 DIAGNOSIS — Z122 Encounter for screening for malignant neoplasm of respiratory organs: Secondary | ICD-10-CM | POA: Diagnosis not present

## 2022-03-14 DIAGNOSIS — Z0000 Encounter for general adult medical examination without abnormal findings: Secondary | ICD-10-CM

## 2022-03-14 DIAGNOSIS — K4090 Unilateral inguinal hernia, without obstruction or gangrene, not specified as recurrent: Secondary | ICD-10-CM | POA: Diagnosis not present

## 2022-03-14 NOTE — Patient Instructions (Addendum)
Thank you for coming to the office today. ? ?You most likely have an Right Inguinal Hernia. ? ?This is caused by a weakness in your abdominal or groin muscles, and is caused by bowel or fatty tissue pushing through this weak spot causing pain and bulging. ? ?Recommend a "Hernia Truss", try to wear this regularly (do not need to wear to bed if pain is improved), until it feels better and then only with activities ?- If it is not improving then you may need to wear it every day, especially if you cannot have a surgery to fix the hernia ?  ?You can find a Truss OTC at [employer] ? ?Or can try a medical supply pharmacy like: ?   ?Try to find positions that give you most relief, likely laying down with head lower than body will allow the hernia bulging to go back into place and feel better. ?  ?May take Tylenol as needed. Recommend to start taking Tylenol Extra Strength '500mg'$  tabs - take 1 to 2 tabs per dose (max '1000mg'$ ) every 6-8 hours for pain (take regularly, don't skip a dose for next 7 days), max 24 hour daily dose is 6 tablets or '3000mg'$ . In the future you can repeat the same everyday Tylenol course for 1-2 weeks at a time.  ?  ?Can try topical ice packs or muscle rub if burning nerve sensation. ?   ?If significant worsening pain or you get bulging that does NOT go down or go away and CANNOT push back in, or nausea, vomiting, then it is very important to go directly to hospital ED for more immediate evaluation, as this can be a life threatening surgical emergency ?  ? ?Referral to Gen Surgery ? ?[facility] ?[address] ?[place], [place]  [address] ?Phone: [phone] ? ?Please schedule a Follow-up Appointment to: Return if symptoms worsen or fail to improve. ? ?If you have any other questions or concerns, please feel free to call the office or send a message through [facility]. You may also schedule an earlier appointment if necessary. ? ?Additionally, you may be receiving a survey about your experience at  our office within a few days to 1 week by e-mail or mail. We value your feedback. ? ?[name], DO ?[facility] ?

## 2022-03-14 NOTE — Progress Notes (Signed)
? ?Subjective:  ? ? Patient ID: Caroline Conley, female    DOB: 15-Jan-1966, 56 y.o.   MRN: [id number] ? ?Caroline Conley is a 56 y.o. female presenting on 03/14/2022 for Mass (Pt complains of a lump in the Rt side of the groin area x 6 weeks. It seem to become inflamed at times and increase in size. When it seems inflamed then it causes discomfort. ) ? ?Patient has also done the Annual Medicare Wellness with Donnie Mesa CMA ? ?HPI ? ?R Groin Bulging / Hernia ?6 weeks Recurrent episodic swelling flare, sore to touch when bothering her, today has improved seems reduced. Seems to be provoked by coughing. ?Episodic flares only ? ? ? ?  03/14/2022  ? 11:22 AM 01/24/2022  ?  9:43 AM 12/19/2020  ?  1:40 PM  ?Depression screen PHQ 2/9  ?Decreased Interest 0 0 0  ?Down, Depressed, Hopeless 0 0 0  ?PHQ - 2 Score 0 0 0  ?Altered sleeping 0 0   ?Tired, decreased energy 0 0   ?Change in appetite 0 0   ?Feeling bad or failure about yourself  0 0   ?Trouble concentrating 0 0   ?Moving slowly or fidgety/restless 0 0   ?Suicidal thoughts 0 0   ?PHQ-9 Score 0 0   ?Difficult doing work/chores Not difficult at all Not difficult at all   ? ? ?Social History  ? ?Tobacco Use  ? Smoking status: Former  ?  Packs/day: 1.00  ?  Years: 30.00  ?  Pack years: 30.00  ?  Types: Cigarettes  ?  Quit date: 03/02/2018  ?  Years since quitting: 4.0  ? Smokeless tobacco: Never  ? Tobacco comments:  ?  0.5 to 1 ppd >30 years  ?Vaping Use  ? Vaping Use: Never used  ?Substance Use Topics  ? Alcohol use: No  ? Drug use: Yes  ?  Types: Oxycodone  ? ? ?Review of Systems ?Per HPI unless specifically indicated above ? ?   ?Objective:  ?  ?BP 98/70 (BP Location: Right Arm, Patient Position: Sitting)   Temp 98.1 ?F (36.7 ?C) (Oral)   Resp 18   Ht 5' 6.5" (1.689 m)   Wt 132 lb (59.9 kg)   SpO2 95%   BMI 20.99 kg/m?   ?Wt Readings from Last 3 Encounters:  ?03/14/22 132 lb (59.9 kg)  ?03/14/22 132 lb (59.9 kg)  ?02/26/22 130 lb (59 kg)  ?  ?Physical Exam ?Vitals and  nursing note reviewed.  ?Constitutional:   ?   General: She is not in acute distress. ?   Appearance: Normal appearance. She is well-developed. She is not diaphoretic.  ?   Comments: Well-appearing, comfortable, cooperative  ?HENT:  ?   Head: Normocephalic and atraumatic.  ?Eyes:  ?   General:     ?   Right eye: No discharge.     ?   Left eye: No discharge.  ?   Conjunctiva/sclera: Conjunctivae normal.  ?Cardiovascular:  ?   Rate and Rhythm: Normal rate.  ?Pulmonary:  ?   Effort: Pulmonary effort is normal.  ?Abdominal:  ?   General: Bowel sounds are normal.  ?   Palpations: Abdomen is soft.  ?   Hernia: A hernia (R lower abdomen in inguinal area of possible direct hernia with palpation on sitting up inc abd pressure with moderate to large bulging but difficult to appreciate if actual herniation) is present.  ?Skin: ?   General: Skin  is warm and dry.  ?   Findings: No erythema or rash.  ?Neurological:  ?   Mental Status: She is alert and oriented to person, place, and time.  ?Psychiatric:     ?   Mood and Affect: Mood normal.     ?   Behavior: Behavior normal.     ?   Thought Content: Thought content normal.  ?   Comments: Well groomed, good eye contact, normal speech and thoughts  ? ?Results for orders placed or performed in visit on 02/07/22  ?ECHOCARDIOGRAM COMPLETE  ?Result Value Ref Range  ? Weight 2,096 oz  ? Height 66.5 in  ? BP 98/70 mmHg  ? AR max vel 2.43 cm2  ? AV Peak grad 6.6 mmHg  ? Ao pk vel 1.28 m/s  ? S' Lateral 3.40 cm  ? Area-P 1/2 6.37 cm2  ? AV Area VTI 2.30 cm2  ? AV Mean grad 4.0 mmHg  ? Single Plane A4C EF 38.9 %  ? Single Plane A2C EF 37.5 %  ? Calc EF 38.9 %  ? AV Area mean vel 2.34 cm2  ? ?   ?Assessment & Plan:  ? ?Problem List Items Addressed This Visit   ?None ?Visit Diagnoses   ? ? Right inguinal hernia    -  Primary  ? Relevant Orders  ? Ambulatory referral to General Surgery  ? ?  ?  ?Referral to General Surgery for Right lower hernia, possibly femoral vs direct inguinal, with  recurrent flares over past 6 weeks, has been reducible, currently active in PCP office, has been painful to touch. She has chronic COPD with cough as likely provoking factor.  ?Return precautions given ? ? ?Orders Placed This Encounter  ?Procedures  ? Ambulatory referral to General Surgery  ?  Referral Priority:   Routine  ?  Referral Type:   Surgical  ?  Referral Reason:   Specialty Services Required  ?  Requested Specialty:   General Surgery  ?  Number of Visits Requested:   1  ? ? ? ?No orders of the defined types were placed in this encounter. ? ? ? ? ?Follow up plan: ?Return if symptoms worsen or fail to improve. ? ? ? ? ?[name], DO ?[facility] ?[facility] ?03/14/2022, 12:00 PM ?

## 2022-03-14 NOTE — Progress Notes (Addendum)
? ?Subjective:  ? Caroline Conley is a 56 y.o. female who presents for Medicare Annual (Subsequent) preventive examination. ? ?Review of Systems    ?Per HPI unless specifically indicated above  ?  ? ?   ?Objective:  ?  ?Today's Vitals  ? 03/14/22 1125  ?BP: 98/70  ?Pulse: (!) 56  ?Resp: 18  ?Temp: 98.1 ?F (36.7 ?C)  ?TempSrc: Oral  ?SpO2: 95%  ?Weight: 132 lb (59.9 kg)  ?Height: 5' 6.5" (1.689 m)  ?PainSc: 6   ? ?Body mass index is 20.99 kg/m?. ? ? ?  01/20/2022  ?  7:37 PM 01/20/2022  ?  4:54 PM 01/20/2022  ? 10:13 AM 01/18/2022  ? 11:10 AM 01/04/2022  ?  2:37 PM 01/02/2022  ?  3:21 PM 06/15/2021  ? 10:56 AM  ?Advanced Directives  ?Does Patient Have a Medical Advance Directive? No  No No No No No  ?Would patient like information on creating a medical advance directive? No - Patient declined No - Patient declined   No - Patient declined No - Patient declined No - Patient declined  ? ? ?Current Medications (verified) ?Outpatient Encounter Medications as of 03/14/2022  ?Medication Sig  ? albuterol (PROVENTIL) (2.5 MG/3ML) 0.083% nebulizer solution Take 2.5 mg by nebulization every 6 (six) hours as needed for shortness of breath or wheezing.  ? chlorpheniramine-HYDROcodone (TUSSIONEX PENNKINETIC ER) 10-8 MG/5ML Take 5 mLs by mouth every 12 (twelve) hours as needed for cough.  ? cimetidine (TAGAMET) 400 MG tablet Take 400 mg by mouth 2 (two) times daily.  ? magic mouthwash w/lidocaine SOLN Swish, gargle, and spit one to two teaspoonfuls every six hours as needed. Shake well before using.  ? metoprolol succinate (TOPROL-XL) 25 MG 24 hr tablet Take 0.5 tablets (12.5 mg total) by mouth daily.  ? Misc. Devices (PULSE OXIMETER FOR FINGER) MISC 1 Device by Does not apply route as needed (for cough, dyspnea). Pulse oximeter for finger for checking oxygen saturation in COPD  ? omeprazole (PRILOSEC) 40 MG capsule Take 1 capsule (40 mg total) by mouth daily before breakfast.  ? oxyCODONE-acetaminophen (PERCOCET) 10-325 MG tablet Take 1 tablet  by mouth every 4 (four) hours.  ? PROAIR HFA 108 (90 Base) MCG/ACT inhaler Inhale 2 puffs into the lungs every 4 (four) hours as needed for wheezing or shortness of breath.  ? promethazine (PHENERGAN) 25 MG tablet Take 0.5-1 tablets (12.5-25 mg total) by mouth every 8 (eight) hours as needed for nausea or vomiting.  ? roflumilast (DALIRESP) 500 MCG TABS tablet Take 1 tablet (500 mcg total) by mouth daily.  ? TRELEGY ELLIPTA 100-62.5-25 MCG/ACT AEPB INHALE 1 PUFF BY MOUTH ONCE DAILY  ? ?No facility-administered encounter medications on file as of 03/14/2022.  ? ? ?Allergies (verified) ?2,4-d dimethylamine; Morphine and related; and Vicodin [hydrocodone-acetaminophen]  ? ?History: ?Past Medical History:  ?Diagnosis Date  ? Atypical chest pain   ? a. 2015 Cath ([facility]): Nl cors. Nl EF; b. 10/2018 MV: No ischemia/infarct. EF>65%.  ? Cancer ([place]) 01/2016  ? basal cell   ? Coronary vasospasm ([facility]CC)   ? a. 06/2021 Cath: LM nl, LADnl, LCX large/nl, RCA 35p (spasm-->tx w/ IC NTG w/ resultant VF req defib).  ? Dysplastic nevus 02/02/2015  ? R upper back - excision  ? Dysplastic nevus 02/16/2015  ? L hip - excision  ? Dysplastic nevus 08/24/2015  ? L upper abdomen  ? Dysplastic nevus 08/11/2017  ? mid abdomen - excised  ? Dysplastic nevus 07/29/2017  ?  Left neck  ? Emphysema of lung ([place])   ? Gastritis   ? Hyperlipidemia   ? Melanoma ([place]) 08/24/2015  ? L buttocks - superficial spreading  ? NICM (nonischemic cardiomyopathy) ([place])   ? a. 06/22/2021 Echo: EF 40%, ant/antseptal/septal HK. Gr1 DD. Nl RV fxn. Mild-mod MR; b. 06/2021 Cath: Nl cors w/ RCA vasospasm (35%). VF w/ IC NTG admin req defib; c. 07/02/2021 Echo: EF 35-40%. Glob HK. Mild MR.  ? Occlusion of right radial artery ([place])   ? a. 06/2021 s/p cardiac cath-->u/s w/ R radial occlusion from puncture extending up entire forearm-->Lovenox '1mg'$ /kg BID x 4 wks; b. 07/2021 U/S: Thrombus in prox Radial arteray w/ minimal reconstitution of flow - improved w/ multiphasic waveforms in R  brachial artery.  ? Palpitations   ? a. 07/2018 48h Holter: predominantly RSR, avg rate 106 (69-166 bpm), Rare PAC's and occas PVC's. 2 atrial runs up to 5 beats. No sustained arrhythmias or prolonged pauses. No symptoms reported; b. 07/2021 Zio: Avg HR 102 bpm, intermittent bundle branch block, rare PACs and PVCs, 9 atrial runs, longest 13.7 secs w/ max HR of 226 bpm.  ? Wears dentures   ? full upper and lower  ? ?Past Surgical History:  ?Procedure Laterality Date  ? ABDOMINAL HYSTERECTOMY    ? APPENDECTOMY    ? BACK SURGERY    ? repeat lumbar herniated disc; apply cage  ? BUNIONECTOMY Right   ? CARDIAC CATHETERIZATION  09/20/2014  ? [facility] - Dr. Clayborn Bigness  ? CHOLECYSTECTOMY    ? COLONOSCOPY WITH PROPOFOL N/A 07/08/2016  ? Procedure: COLONOSCOPY WITH PROPOFOL;  Surgeon: Lucilla Lame, MD;  Location: [facility];  Service: Endoscopy;  Laterality: N/A;  ? DEBRIDEMENT TENNIS ELBOW    ? ESOPHAGOGASTRODUODENOSCOPY  09/16/2014  ? HARDWARE REMOVAL Left 01/04/2022  ? Procedure: HARDWARE REMOVAL;  Surgeon: Sharlotte Alamo, DPM;  Location: [facility];  Service: Podiatry;  Laterality: Left;  ? LEFT HEART CATH AND CORONARY ANGIOGRAPHY N/A 07/02/2021  ? Procedure: LEFT HEART CATH AND CORONARY ANGIOGRAPHY;  Surgeon: [name], MD;  Location: [facility];  Service: Cardiovascular;  Laterality: N/A;  ? LUMBAR DISC SURGERY    ? herniated disc  ? LUMBAR DISC SURGERY    ? took screws out from previous lumbar disc surgery  ? METATARSAL OSTEOTOMY  05/18/2013  ? ROTATOR CUFF REPAIR Right   ? TONSILLECTOMY    ? ?Family History  ?Problem Relation Age of Onset  ? Diabetes Mother   ? High blood pressure Mother   ? Gout Mother   ? Cancer Father   ? Stroke Father   ? Arthritis/Rheumatoid Father   ? Diabetes Father   ? High blood pressure Father   ? Heart failure Father   ? Heart failure Brother   ? Diabetes Brother   ? ?Social History  ? ?Socioeconomic History  ? Marital status: Married  ?  Spouse name: Patrick Jupiter  ? Number of children: 2   ? Years of education: Not on file  ? Highest education level: Not on file  ?Occupational History  ? Occupation: disability  ?Tobacco Use  ? Smoking status: Former  ?  Packs/day: 1.00  ?  Years: 30.00  ?  Pack years: 30.00  ?  Types: Cigarettes  ?  Quit date: 03/02/2018  ?  Years since quitting: 4.0  ? Smokeless tobacco: Never  ? Tobacco comments:  ?  0.5 to 1 ppd >30 years  ?Vaping Use  ? Vaping Use: Never  used  ?Substance and Sexual Activity  ? Alcohol use: No  ? Drug use: Yes  ?  Types: Oxycodone  ? Sexual activity: Yes  ?Other Topics Concern  ? Not on file  ?Social History Narrative  ? Not on file  ? ?Social Determinants of Health  ? ?Financial Resource Strain: Low Risk   ? Difficulty of Paying Living Expenses: Not very hard  ?Food Insecurity: No Food Insecurity  ? Worried About Charity fundraiser in the Last Year: Never true  ? Ran Out of Food in the Last Year: Never true  ?Transportation Needs: No Transportation Needs  ? Lack of Transportation (Medical): No  ? Lack of Transportation (Non-Medical): No  ?Physical Activity: Insufficiently Active  ? Days of Exercise per Week: 3 days  ? Minutes of Exercise per Session: 20 min  ?Stress: No Stress Concern Present  ? Feeling of Stress : Not at all  ?Social Connections: Moderately Integrated  ? Frequency of Communication with Friends and Family: More than three times a week  ? Frequency of Social Gatherings with Friends and Family: More than three times a week  ? Attends Religious Services: 1 to 4 times per year  ? Active Member of Clubs or Organizations: No  ? Attends Archivist Meetings: Never  ? Marital Status: Married  ? ? ?Tobacco Counseling ?Counseling given: Not Answered ?Tobacco comments: 0.5 to 1 ppd >30 years ? ? ?Clinical Intake: ? ?Pre-visit preparation completed: No ? ?Pain : 0-10 ?Pain Score: 6  ?Pain Location: Shoulder ?Pain Descriptors / Indicators: Aching ?Pain Frequency: Constant ? ?  ? ?Nutritional Status: BMI of 19-24  Normal ?Diabetes:  No ? ?  ? ?Diabetic?No ? ?Interpreter Needed?: No ? ?Information entered by :: Donnie Mesa, CMA ? ? ?Activities of Daily Living ? ?  03/14/2022  ? 11:27 AM 01/20/2022  ?  7:37 PM  ?In your present sta

## 2022-03-14 NOTE — Patient Instructions (Signed)

## 2022-03-25 ENCOUNTER — Ambulatory Visit
Admission: RE | Admit: 2022-03-25 | Discharge: 2022-03-25 | Disposition: A | Discharge status: Home / Self Care | Payer: 59 | Source: Ambulatory Visit | Attending: Family Medicine | Admitting: Family Medicine

## 2022-03-25 DIAGNOSIS — J432 Centrilobular emphysema: Secondary | ICD-10-CM | POA: Insufficient documentation

## 2022-03-25 DIAGNOSIS — I700 Atherosclerosis of aorta: Secondary | ICD-10-CM | POA: Insufficient documentation

## 2022-03-25 DIAGNOSIS — Z122 Encounter for screening for malignant neoplasm of respiratory organs: Secondary | ICD-10-CM | POA: Insufficient documentation

## 2022-03-25 DIAGNOSIS — F17200 Nicotine dependence, unspecified, uncomplicated: Secondary | ICD-10-CM | POA: Diagnosis not present

## 2022-03-25 DIAGNOSIS — R918 Other nonspecific abnormal finding of lung field: Secondary | ICD-10-CM | POA: Insufficient documentation

## 2022-04-01 ENCOUNTER — Encounter: Payer: 59 | Admitting: Dermatology

## 2022-04-02 ENCOUNTER — Other Ambulatory Visit: Payer: Self-pay | Admitting: General Surgery

## 2022-04-02 DIAGNOSIS — K4090 Unilateral inguinal hernia, without obstruction or gangrene, not specified as recurrent: Secondary | ICD-10-CM | POA: Diagnosis not present

## 2022-04-02 NOTE — Progress Notes (Signed)
Subjective:  ?  ? Patient ID: Caroline Conley is a 56 y.o. female. ?  ?HPI ?  ?The following portions of the patient's history were reviewed and updated as appropriate. ?  ?This a new patient is here today for: office visit. Here for evaluation of a right inguinal hernia referred by Dr Parks Ranger. ?She states the knot comes and goes. She states she has had this for at least 3 months. She states the pain seems to be getting worse. She states her bowels move daily. ?  ?She is her with her cousin, [name]. ?  ?Review of Systems  ?Constitutional: Negative for chills and fever.  ?Respiratory: Negative for cough.   ?  ?  ?   ?Chief Complaint  ?Patient presents with  ? Hernia  ?  ?  ?BP 106/64   Pulse 98   Temp 36.7 ?C (98 ?F)   Ht 167.6 cm ('5\' 6"' )   Wt 59 kg (130 lb)   SpO2 98%   BMI 20.98 kg/m?  ?  ?    ?Past Medical History:  ?Diagnosis Date  ? Cancer (CMS-HCC) 01/2016  ?  basal cell  ? Chronic back pain    ?  and shoulder  ? Dysplastic nevus 2018  ?  mid abdomen and left neck  ? Dysplastic nevus 2016  ?  left hip and right upper back  ? Emphysema lung (CMS-HCC)    ? Erosive esophagitis 09/16/2014  ?  LA GRADE C  ? FHx: chronic obstructive pulmonary disease    ? Gastritis 09/16/2014  ? Hyperlipidemia    ? NICM (nonischemic cardiomyopathy) (CMS-HCC)    ? Palpitations    ? Shoulder joint pain    ? SOB (shortness of breath)    ? Tachycardia    ? Vertigo 03/30/2019  ? Wears dentures    ?  full upper and lower  ?  ?  ?     ?Past Surgical History:  ?Procedure Laterality Date  ? EGD   09/16/2014  ?  no repeat per [facility]  ? Cardiac catherization   09/20/2014  ?  [facility] - Dr. Clayborn Bigness  ? OSTEOTOMY METATARSAL   05/18/2016  ? COLONOSCOPY   07/08/2016  ? heart cath and coronary angiography Left 07/02/2021  ? REMOVAL HARDWARE ANKLE FOOT/TOES Left 01/04/2022  ? APPENDECTOMY      ? ARTHROSCOPIC ROTATOR CUFF REPAIR      ? bunionectomy Right    ? CHOLECYSTECTOMY      ? debridement tennis elbow      ? HYSTERECTOMY SUPRACERVICAL  ABDOMINAL W/REMOVAL TUBES &/OR OVARIES      ? OTHER SURGERY      ?  Back x's 3  ? OTHER SURGERY Right    ?  Elbow  ? TONSILLECTOMY      ?  ?  ?  ?        ?OB History   ?  Gravida  ?2  ? Para  ?2  ? Term  ?   ? Preterm  ?   ? AB  ?   ? Living  ?   ?  ?  SAB  ?   ? IAB  ?   ? Ectopic  ?   ? Molar  ?   ? Multiple  ?   ? Live Births  ?   ?  ?  ?  Obstetric Comments  ?Age at first period 65 ?Age of first pregnancy 5 ?   ?  ?   ?  ?  ?  Social History  ?  ?  ?     ?Socioeconomic History  ? Marital status: Married  ?Tobacco Use  ? Smoking status: Former  ?    Packs/day: 1.00  ?    Years: 30.00  ?    Pack years: 30.00  ?    Types: Cigarettes  ?    Quit date: 05/16/2018  ?    Years since quitting: 3.8  ? Smokeless tobacco: Never  ?Substance and Sexual Activity  ? Alcohol use: No  ? Drug use: No  ? Sexual activity: Defer  ?  ?  ?  ?     ?Allergies  ?Allergen Reactions  ? Morphine (Bulk) Itching  ? Oleamidopropyl Dimethylamine Itching  ? Hydrocodone-Acetaminophen Nausea And Vomiting  ?    GI distress ?   ?  ?  ?Current Medications  ?      ?Current Outpatient Medications  ?Medication Sig Dispense Refill  ? albuterol (PROVENTIL) 2.5 mg /3 mL (0.083 %) nebulizer solution albuterol sulfate 2.5 mg/3 mL (0.083 %) solution for nebulization      ? albuterol 90 mcg/actuation inhaler ProAir HFA 90 mcg/actuation aerosol inhaler      ? cimetidine (TAGAMET) 400 MG tablet        ? fluconazole (DIFLUCAN) 150 MG tablet as needed      ? fluticasone-umeclidinium-vilanterol (TRELEGY ELLIPTA) 100-62.5-25 mcg inhaler Trelegy Ellipta 100 mcg-62.5 mcg-25 mcg powder for inhalation      ? metoprolol succinate (TOPROL-XL) 25 MG XL tablet Take by mouth      ? oxyCODONE-acetaminophen (PERCOCET) 10-325 mg tablet oxycodone-acetaminophen 10 mg-325 mg tablet ? 1 Q4HRSPRN FIll on 03/08/2022      ? roflumilast (DALIRESP) 500 mcg tablet Take by mouth      ? bisoprolol (ZEBETA) 5 MG tablet bisoprolol fumarate 5 mg tablet (Patient not taking: Reported on 04/02/2022)       ? oxyCODONE (ROXICODONE) 5 MG immediate release tablet  (Patient not taking: Reported on 04/02/2022)      ?  ?No current facility-administered medications for this visit.  ?  ?  ?  ?     ?Family History  ?Problem Relation Age of Onset  ? Diabetes type I Mother    ? Hyperlipidemia (Elevated cholesterol) Mother    ? High blood pressure (Hypertension) Mother    ? Gout Mother    ? High blood pressure (Hypertension) Father    ? Diabetes Father    ? Rheum arthritis Father    ? Cancer Father    ? Myocardial Infarction (Heart attack) Father    ? Heart disease Father    ? Diabetes Brother    ? Heart failure Brother    ? Colon cancer Neg Hx    ? Colon polyps Neg Hx    ? Liver disease Neg Hx    ? Rectal cancer Neg Hx    ? Ulcers Neg Hx    ? Breast cancer Neg Hx    ?  ?  ?  ?Labs and Radiology:  ?  ?  ?February 07, 2022 cardiac echo: ?  ?FINDINGS  ? Left Ventricle: Left ventricular ejection fraction, by estimation, is 35  ?to 40%. Left ventricular ejection fraction by 2D MOD biplane is 38.9 %.  ?The left ventricle has moderately decreased function. The left ventricle  ?demonstrates regional wall motion  ?abnormalities. Definity contrast agent was given IV to delineate the left  ?ventricular endocardial borders. Global longitudinal strain performed but  ?not reported based  on interpreter judgement due to suboptimal tracking.  ?The left ventricular internal  ?cavity size was normal in size. There is no left ventricular hypertrophy.  ?Left ventricular diastolic parameters are consistent with Grade II  ?diastolic dysfunction (pseudonormalization).  ?  ?  ?CRP 0 - 10 mg/L 31 High    ?  ?RP 0 - 10 mg/L 31 High    ?  ?Glucose 70 - 99 mg/dL 109 High    ?BUN 6 - 24 mg/dL 7   ?Creatinine, Ser 0.57 - 1.00 mg/dL 1.08 High    ?eGFR >59 mL/min/1.73 60   ?BUN/Creatinine Ratio 9 - 23 6 Low    ?Sodium 134 - 144 mmol/L 136   ?Potassium 3.5 - 5.2 mmol/L 4.5   ?Chloride 96 - 106 mmol/L 99   ?CO2 20 - 29 mmol/L 25   ?Calcium 8.7 - 10.2 mg/dL 9.8    ?Total Protein 6.0 - 8.5 g/dL 6.2   ?Albumin 3.8 - 4.9 g/dL 3.8   ?Globulin, Total 1.5 - 4.5 g/dL 2.4   ?Albumin/Globulin Ratio 1.2 - 2.2 1.6   ?Bilirubin Total 0.0 - 1.2 mg/dL 0.2   ?Alkaline Phosphatase 44 - 121 IU/L 143 High    ?AST 0 - 40 IU/L 30   ?ALT 0 - 32 IU/L 25   ?  ?WBC 3.4 - 10.8 x10E3/uL 8.6   ?RBC 3.77 - 5.28 x10E6/uL 3.75 Low    ?Hemoglobin 11.1 - 15.9 g/dL 12.8   ?Hematocrit 34.0 - 46.6 % 36.6   ?MCV 79 - 97 fL 98 High    ?MCH 26.6 - 33.0 pg 34.1 High    ?MCHC 31.5 - 35.7 g/dL 35.0   ?RDW 11.7 - 15.4 % 12.5   ?Platelets 150 - 450 x10E3/uL 294   ?Neutrophils Not Estab. % 59   ?Lymphs Not Estab. % 22   ?Monocytes Not Estab. % 12   ?Eos Not Estab. % 3   ?Basos Not Estab. % 1   ?Neutrophils Absolute 1.4 - 7.0 x10E3/uL 5.2   ?Lymphocytes Absolute 0.7 - 3.1 x10E3/uL 1.9   ?Monocytes Absolute 0.1 - 0.9 x10E3/uL 1.0 High    ?EOS (ABSOLUTE) 0.0 - 0.4 x10E3/uL 0.2   ?Basophils Absolute 0.0 - 0.2 x10E3/uL 0.1   ?Immature Granulocytes Not Estab. % 3   ?Immature Grans (Abs) 0.0 - 0.1 x10E3/uL 0.2 High    ?  ?  ?  ?  ?   ?Objective:  ? Physical Exam ?Exam conducted with a chaperone present.  ?Constitutional:   ?   Appearance: Normal appearance.  ?Cardiovascular:  ?   Rate and Rhythm: Normal rate and regular rhythm.  ?   Pulses: Normal pulses.  ?   Heart sounds: Normal heart sounds.  ?Pulmonary:  ?   Effort: Pulmonary effort is normal.  ?   Breath sounds: Normal breath sounds.  ?Abdominal:  ?   General: Bowel sounds are normal.  ?   Palpations: Abdomen is soft.  ?   Tenderness: There is no abdominal tenderness.  ?   Hernia: A hernia is present. Hernia is present in the right inguinal area (impulse with Valsalva when supine. ). There is no hernia in the left inguinal area.  ?Musculoskeletal:  ?   Cervical back: Neck supple.  ?Skin: ?   General: Skin is warm and dry.  ?Neurological:  ?   Mental Status: She is alert and oriented to person, place, and time.  ?Psychiatric:     ?   Mood and Affect:  Mood normal.     ?    Behavior: Behavior normal.  ?  ?  ?  ?   ?Assessment:  ?   ?Symptomatic right inguinal hernia. ?   ?Plan:  ?   ?Indications for surgical repair reviewed.  Role of prosthetic mesh discussed.  Opportunity for

## 2022-04-09 ENCOUNTER — Other Ambulatory Visit: Payer: Self-pay | Admitting: Family Medicine

## 2022-04-09 DIAGNOSIS — K219 Gastro-esophageal reflux disease without esophagitis: Secondary | ICD-10-CM

## 2022-04-09 DIAGNOSIS — J431 Panlobular emphysema: Secondary | ICD-10-CM

## 2022-04-09 DIAGNOSIS — B370 Candidal stomatitis: Secondary | ICD-10-CM

## 2022-04-09 DIAGNOSIS — J441 Chronic obstructive pulmonary disease with (acute) exacerbation: Secondary | ICD-10-CM

## 2022-04-10 ENCOUNTER — Encounter
Admission: RE | Admit: 2022-04-10 | Discharge: 2022-04-10 | Disposition: A | Discharge status: Home / Self Care | Payer: 59 | Source: Ambulatory Visit | Attending: General Surgery | Admitting: General Surgery

## 2022-04-10 ENCOUNTER — Telehealth: Payer: Self-pay | Admitting: *Deleted

## 2022-04-10 HISTORY — DX: Gastro-esophageal reflux disease without esophagitis: K21.9

## 2022-04-10 HISTORY — DX: Cardiac arrhythmia, unspecified: I49.9

## 2022-04-10 NOTE — Patient Instructions (Addendum)
?Your procedure is scheduled on: Wednesday Apr 17, 2022. ?Report to Day Surgery inside [facility] 2nd floor, stop by admissions desk before getting on elevator. ?To find out your arrival time please call [phone] between 1PM - 3PM on Tuesday Apr 16, 2022. ? ?Remember: Instructions that are not followed completely may result in serious medical risk,  ?up to and including death, or upon the discretion of your surgeon and anesthesiologist your  ?surgery may need to be rescheduled.  ? ?  _X__ 1. Do not eat food after midnight the night before your procedure. ?                No chewing gum or hard candies. You may drink clear liquids up to 2 hours ?                before you are scheduled to arrive for your surgery- DO not drink clear ?                liquids within 2 hours of the start of your surgery. ?                Clear Liquids include:  water, apple juice without pulp, clear Gatorade, G2 or  ?                Gatorade Zero (avoid Red/Purple/Blue), Black Coffee or Tea (Do not add ?                anything to coffee or tea). ? ?__X__2.  On the morning of surgery brush your teeth with toothpaste and water, you ?               may rinse your mouth with mouthwash if you wish.  Do not swallow any toothpaste or mouthwash. ?   ? _X__ 3.  No Alcohol for 24 hours before or after surgery. ? ? _X__ 4.  Do Not Smoke or use e-cigarettes For 24 Hours Prior to Your Surgery. ?                Do not use any chewable tobacco products for at least 6 hours prior to ?                Surgery. ? ?_X__  5.  Do not use any recreational drugs (marijuana, cocaine, heroin, ecstasy, MDMA or other) ?               For at least one week prior to your surgery.  Combination of these drugs with anesthesia ?               May have life threatening results. ? ?____  6.  Bring all medications with you on the day of surgery if instructed.  ? ?__X__  7.  Notify your doctor if there is any change in your medical condition  ?     (cold, fever, infections). ?    ?Do not wear jewelry, make-up, hairpins, clips or nail polish. ?Do not wear lotions, powders, or perfumes. You may wear deodorant. ?Do not shave 48 hours prior to surgery. Men may shave face and neck. ?Do not bring valuables to the hospital.   ? ?[facility] is not responsible for any belongings or valuables. ? ?Contacts, dentures or bridgework may not be worn into surgery. ?Leave your suitcase in the car. After surgery it may be brought to your room. ?For patients admitted to the hospital, discharge time is determined  by your ?treatment team. ?  ?Patients discharged the day of surgery will not be allowed to drive home.   ?Make arrangements for someone to be with you for the first 24 hours of your ?Same Day Discharge. ? ? ?__X__ Take these medicines the morning of surgery with A SIP OF WATER:  ? ? 1. metoprolol succinate (TOPROL-XL) 25 MG  ? 2. omeprazole (PRILOSEC) 40 MG ? 3.  ? 4. ? 5. ? 6. ? ?____ Fleet Enema (as directed)  ? ?__X__ Use antibacterial  Soap (or wipes) as directed ? ?____ Use Benzoyl Peroxide Gel as instructed ? ?__X__ Use inhalers on the day of surgery ? TRELEGY ELLIPTA 100-62.5-25 MCG/ACT AEPB ? PROAIR HFA 108 (90 Base) MCG/ACT inhaler ?____ Stop metformin 2 days prior to surgery   ? ?____ Take 1/2 of usual insulin dose the night before surgery. No insulin the morning ?         of surgery.  ? ?____ Call your PCP, cardiologist, or Pulmonologist if taking Coumadin/Plavix/aspirin and ask when to stop before your surgery.  ? ?__X__ One Week prior to surgery- Stop Anti-inflammatories such as Ibuprofen, Aleve, Advil, Motrin, meloxicam (MOBIC), diclofenac, etodolac, ketorolac, Toradol, Daypro, piroxicam, Goody's or BC powders. OK TO USE TYLENOL IF NEEDED ?  ?__X__ Stop supplements until after surgery.   ? ?____ Bring C-Pap to the hospital.  ? ? ?If you have any questions regarding your pre-procedure instructions,  ?Please call Pre-admit Testing at [phone] ?

## 2022-04-10 NOTE — Telephone Encounter (Signed)
-----   Message from Karen Kitchens, NP sent at 04/10/2022  2:01 PM EDT ----- ?Regarding: Request for pre-operative cardiac clearance ?Request for pre-operative cardiac clearance: ?? ?1. What type of surgery is being performed?  ?HERNIA REPAIR INGUINAL ADULT ? ?2. When is this surgery scheduled?  ?04/17/2022 ?? ?3. Type of clearance being requested (medical, pharmacy, both). ?MEDICAL ?  ?4. Are there any medications that need to be held prior to surgery? ?NONE ? ?5. Practice name and name of physician performing surgery?  ?Performing surgeon: Dr. Hervey Ard, MD ?Requesting clearance: Honor Loh, FNP-C   ?? ?6. Anesthesia type (none, local, MAC, general)? ?GENERAL ? ?7. What is the office phone and fax number?   ?Phone: [phone] ?Fax: [phone] ? ?ATTENTION: Unable to create telephone message as per your standard workflow. Directed by [facility] providers to send requests for cardiac clearance to this pool for appropriate distribution to provider covering pre-operative clearances.  ? ?Honor Loh, MSN, APRN, FNP-C, CEN ?[place]  ?Peri-operative Services Nurse Practitioner ?Phone: [phone] ?04/10/22 2:01 PM ? ?

## 2022-04-11 ENCOUNTER — Encounter: Payer: Self-pay | Admitting: General Surgery

## 2022-04-11 NOTE — Telephone Encounter (Signed)
Requested medication (s) are due for refill today: yes ? ?Requested medication (s) are on the active medication list: no ? ?Last refill:  10/19/21  Prescription ended 01/17/22 ? ?Future visit scheduled: no ? ?Notes to clinic:  no protocol assigned nd prescription ended ? ? ?Requested Prescriptions  ?Pending Prescriptions Disp Refills  ? fluconazole (DIFLUCAN) 150 MG tablet [Pharmacy Med Name: FLUCONAZOLE 150 MG TAB] 6 tablet 3  ?  Sig: TAKE 1 TABLET BY MOUTH ON DAY 1 . REPEAT2ND DOSE ON DAY 3  ?  ? Off-Protocol Failed - 04/09/2022 10:27 AM  ?  ?  Failed - Medication not assigned to a protocol, review manually.  ?  ?  Passed - Valid encounter within last 12 months  ?  Recent Outpatient Visits   ? ?      ? 4 weeks ago Right inguinal hernia  ? [place], DO  ? 1 month ago COPD with acute exacerbation [facility])  ? [place], DO  ? 1 month ago Chronic bursitis of right shoulder  ? [place], DO  ? 2 months ago Community acquired pneumonia of right lower lobe of lung  ? [place], DO  ? 3 months ago Left foot pain  ? [place], DO  ? ?  ?  ?Future Appointments   ? ?        ? In 1 week End, Harrell Gave, MD [facility]  ? In 4 weeks Ralene Bathe, MD [facility]  ? ?  ? ? ?  ?  ?  ?Signed Prescriptions Disp Refills  ? albuterol (VENTOLIN HFA) 108 (90 Base) MCG/ACT inhaler 8.5 g 3  ?  Sig: INHALE 2 PUFFS INTO THE LUNGS EVERY 4 HOURS AS NEEDED FOR WHEEZING  ?  ? Pulmonology:  Beta Agonists 2 Passed - 04/09/2022 10:27 AM  ?  ?  Passed - Last BP in normal range  ?  BP Readings from Last 1 Encounters:  ?03/14/22 98/70  ?  ?  ?  ?  Passed - Last Heart Rate in normal range  ?  Pulse Readings from Last 1 Encounters:  ?03/14/22 (!) 56  ?  ?  ?  ?  Passed - Valid encounter within last 12  months  ?  Recent Outpatient Visits   ? ?      ? 4 weeks ago Right inguinal hernia  ? [place], DO  ? 1 month ago COPD with acute exacerbation [facility])  ? [place], DO  ? 1 month ago Chronic bursitis of right shoulder  ? [place], DO  ? 2 months ago Community acquired pneumonia of right lower lobe of lung  ? [place], DO  ? 3 months ago Left foot pain  ? [place], DO  ? ?  ?  ?Future Appointments   ? ?        ? In 1 week End, Harrell Gave, MD [facility], [facility]  ? In 4 weeks Ralene Bathe, MD [place]  ? ?  ? ? ?  ?  ?  ?Refused Prescriptions Disp Refills  ? omeprazole (PRILOSEC) 40 MG capsule [Pharmacy Med Name: OMEPRAZOLE DR 40  MG CAP] 90 capsule   ?  Sig: TAKE 1 CAPSULE BY MOUTH ONCE DAILY BEFORE BREAKFAST  ?  ? Gastroenterology: Proton Pump Inhibitors Passed - 04/09/2022 10:27 AM  ?  ?  Passed - Valid encounter within last 12 months  ?  Recent Outpatient Visits   ? ?      ? 4 weeks ago Right inguinal hernia  ? [place], DO  ? 1 month ago COPD with acute exacerbation [facility])  ? [place], DO  ? 1 month ago Chronic bursitis of right shoulder  ? [place], DO  ? 2 months ago Community acquired pneumonia of right lower lobe of lung  ? [place], DO  ? 3 months ago Left foot pain  ? [place], DO  ? ?  ?  ?Future Appointments   ? ?        ? In 1 week End, Harrell Gave, MD [facility], [facility]  ? In 4 weeks Ralene Bathe, MD [place]  ? ?  ? ? ?  ?  ?  ? ? ? ? ?

## 2022-04-11 NOTE — Telephone Encounter (Signed)
Left message to call to schedule a tele pre op appt ?

## 2022-04-11 NOTE — Telephone Encounter (Signed)
Requested Prescriptions  ?Pending Prescriptions Disp Refills  ?? omeprazole (PRILOSEC) 40 MG capsule [Pharmacy Med Name: OMEPRAZOLE DR 40 MG CAP] 90 capsule   ?  Sig: TAKE 1 CAPSULE BY MOUTH ONCE DAILY BEFORE BREAKFAST  ?  ? Gastroenterology: Proton Pump Inhibitors Passed - 04/09/2022 10:27 AM  ?  ?  Passed - Valid encounter within last 12 months  ?  Recent Outpatient Visits   ?      ? 4 weeks ago Right inguinal hernia  ? [place], DO  ? 1 month ago COPD with acute exacerbation [facility])  ? [place], DO  ? 1 month ago Chronic bursitis of right shoulder  ? [place], DO  ? 2 months ago Community acquired pneumonia of right lower lobe of lung  ? [place], DO  ? 3 months ago Left foot pain  ? [place], DO  ?  ?  ?Future Appointments   ?        ? In 1 week End, Harrell Gave, MD [facility], [facility]  ? In 4 weeks Ralene Bathe, MD [place]  ?  ? ?  ?  ?  ?? fluconazole (DIFLUCAN) 150 MG tablet [Pharmacy Med Name: FLUCONAZOLE 150 MG TAB] 6 tablet 3  ?  Sig: TAKE 1 TABLET BY MOUTH ON DAY 1 . REPEAT2ND DOSE ON DAY 3  ?  ? Off-Protocol Failed - 04/09/2022 10:27 AM  ?  ?  Failed - Medication not assigned to a protocol, review manually.  ?  ?  Passed - Valid encounter within last 12 months  ?  Recent Outpatient Visits   ?      ? 4 weeks ago Right inguinal hernia  ? [place], DO  ? 1 month ago COPD with acute exacerbation [facility])  ? [place], DO  ? 1 month ago Chronic bursitis of right shoulder  ? [place], DO  ? 2 months ago Community acquired pneumonia of right lower lobe of lung  ? [place], DO  ? 3 months ago  Left foot pain  ? [place], DO  ?  ?  ?Future Appointments   ?        ? In 1 week End, Harrell Gave, MD [facility], [facility]  ? In 4 weeks Ralene Bathe, MD [place]  ?  ? ?  ?  ?  ?Signed Prescriptions Disp Refills  ? albuterol (VENTOLIN HFA) 108 (90 Base) MCG/ACT inhaler 8.5 g 3  ?  Sig: INHALE 2 PUFFS INTO THE LUNGS EVERY 4 HOURS AS NEEDED FOR WHEEZING  ?  ? Pulmonology:  Beta Agonists 2 Passed - 04/09/2022 10:27 AM  ?  ?  Passed - Last BP in normal range  ?  BP Readings from Last 1 Encounters:  ?03/14/22 98/70  ?   ?  ?  Passed - Last Heart Rate in normal range  ?  Pulse Readings from Last 1 Encounters:  ?03/14/22 (!) 56  ?   ?  ?  Passed - Valid encounter within last 12 months  ?  Recent Outpatient Visits   ?      ? 4 weeks ago Right inguinal  hernia  ? [place], DO  ? 1 month ago COPD with acute exacerbation [facility])  ? [place], DO  ? 1 month ago Chronic bursitis of right shoulder  ? [place], DO  ? 2 months ago Community acquired pneumonia of right lower lobe of lung  ? [place], DO  ? 3 months ago Left foot pain  ? [place], DO  ?  ?  ?Future Appointments   ?        ? In 1 week End, Harrell Gave, MD [facility], [facility]  ? In 4 weeks Ralene Bathe, MD [place]  ?  ? ?  ?  ?  ? ? ?

## 2022-04-11 NOTE — Telephone Encounter (Signed)
? ? ?  Name: Caroline Conley  ?DOB: 11/08/1966  ?MRN: [id number] ? ?Primary Cardiologist: [name], MD ? ? ?Preoperative team, please contact this patient and set up a phone call appointment for further preoperative risk assessment. Please obtain consent and complete medication review. Thank you for your help. ? ?I confirm that guidance regarding antiplatelet and oral anticoagulation therapy has been completed and, if necessary, noted below. ? ? ? ?Christell Faith, PA-C ?04/11/2022, 8:57 AM ?[facility] ?[address] ?[place], [place] [address] ? ? ?

## 2022-04-11 NOTE — Telephone Encounter (Signed)
Requested Prescriptions  ?Pending Prescriptions Disp Refills  ?? omeprazole (PRILOSEC) 40 MG capsule [Pharmacy Med Name: OMEPRAZOLE DR 40 MG CAP] 90 capsule   ?  Sig: TAKE 1 CAPSULE BY MOUTH ONCE DAILY BEFORE BREAKFAST  ?  ? Gastroenterology: Proton Pump Inhibitors Passed - 04/09/2022 10:27 AM  ?  ?  Passed - Valid encounter within last 12 months  ?  Recent Outpatient Visits   ?      ? 4 weeks ago Right inguinal hernia  ? [place], DO  ? 1 month ago COPD with acute exacerbation [facility])  ? [place], DO  ? 1 month ago Chronic bursitis of right shoulder  ? [place], DO  ? 2 months ago Community acquired pneumonia of right lower lobe of lung  ? [place], DO  ? 3 months ago Left foot pain  ? [place], DO  ?  ?  ?Future Appointments   ?        ? In 1 week End, Harrell Gave, MD [facility]  ? In 4 weeks Ralene Bathe, MD [place]  ?  ? ?  ?  ?  ?? albuterol (VENTOLIN HFA) 108 (90 Base) MCG/ACT inhaler [Pharmacy Med Name: ALBUTEROL SULFATE HFA 108 (90 BASE)] 8.5 g 3  ?  Sig: INHALE 2 PUFFS INTO THE LUNGS EVERY 4 HOURS AS NEEDED FOR WHEEZING  ?  ? Pulmonology:  Beta Agonists 2 Passed - 04/09/2022 10:27 AM  ?  ?  Passed - Last BP in normal range  ?  BP Readings from Last 1 Encounters:  ?03/14/22 98/70  ?   ?  ?  Passed - Last Heart Rate in normal range  ?  Pulse Readings from Last 1 Encounters:  ?03/14/22 (!) 56  ?   ?  ?  Passed - Valid encounter within last 12 months  ?  Recent Outpatient Visits   ?      ? 4 weeks ago Right inguinal hernia  ? [place], DO  ? 1 month ago COPD with acute exacerbation [facility])  ? [place], DO  ? 1 month ago Chronic bursitis of right shoulder  ? [place], DO  ? 2 months ago Community acquired pneumonia of right lower lobe of lung  ? [place], DO  ? 3 months ago Left foot pain  ? [place], DO  ?  ?  ?Future Appointments   ?        ? In 1 week End, Harrell Gave, MD [facility], [facility]  ? In 4 weeks Ralene Bathe, MD [place]  ?  ? ?  ?  ?  ?? fluconazole (DIFLUCAN) 150 MG tablet [Pharmacy Med Name: FLUCONAZOLE 150 MG TAB] 6 tablet 3  ?  Sig: TAKE 1 TABLET BY MOUTH ON DAY 1 . REPEAT2ND DOSE ON DAY 3  ?  ? Off-Protocol Failed - 04/09/2022 10:27 AM  ?  ?  Failed - Medication not assigned to a protocol, review manually.  ?  ?  Passed - Valid encounter within last 12 months  ?  Recent Outpatient Visits   ?      ? 4  weeks ago Right inguinal hernia  ? [place], DO  ? 1 month ago COPD with acute exacerbation [facility])  ? [place], DO  ? 1 month ago Chronic bursitis of right shoulder  ? [place], DO  ? 2 months ago Community acquired pneumonia of right lower lobe of lung  ? [place], DO  ? 3 months ago Left foot pain  ? [place], DO  ?  ?  ?Future Appointments   ?        ? In 1 week End, Harrell Gave, MD [facility], [facility]  ? In 4 weeks Ralene Bathe, MD [place]  ?  ? ?  ?  ?  ? ? ?

## 2022-04-12 ENCOUNTER — Encounter: Payer: Self-pay | Admitting: General Surgery

## 2022-04-12 NOTE — Progress Notes (Signed)
?Perioperative Services ? ?Pre-Admission/Anesthesia Testing Clinical Review ? ?Date: 04/12/22 ? ?Patient Demographics:  ?Name: Caroline Conley ?DOB:   10-26-1966 ?MRN:   [id number] ? ?Planned Surgical Procedure(s):  ? ? Case: [id number] Date/Time: 04/17/22 1145  ? Procedure: HERNIA REPAIR INGUINAL ADULT (Right) - open  ? Anesthesia type: General  ? Pre-op diagnosis: right inguinal hernia  ? Location: [facility] OR ROOM 03 / [facility] ORS FOR ANESTHESIA GROUP  ? Surgeons: Robert Bellow, MD  ? ?NOTE: Available PAT nursing documentation and vital signs have been reviewed. Clinical nursing staff has updated patient's PMH/PSHx, current medication list, and drug allergies/intolerances to ensure comprehensive history available to assist in medical decision making as it pertains to the aforementioned surgical procedure and anticipated anesthetic course. Extensive review of available clinical information performed. [place] PMH and PSHx updated with any diagnoses/procedures that  may have been inadvertently omitted during her intake with the pre-admission testing department's nursing staff. ? ?Clinical Discussion:  ?Caroline Conley is a 56 y.o. female who is submitted for pre-surgical anesthesia review and clearance prior to her undergoing the above procedure. Patient is a Former Smoker (30 pack years; quit 02/2018). Pertinent PMH includes: HFrEF, NICM, atypical chest pain, LBBB, iatrogenic ventricular fibrillation, coronary vasospasm, palpitations, HLD, GERD (on daily PPI), COPD. ? ?Patient is followed by cardiology (End, MD). She was last seen in the cardiology clinic on 02/07/2022; notes reviewed.  At the time of her clinic visit, patient reporting daily episodes of chest pain that she described as "heartburn".  Symptoms not related to activity, position, deep inspiration, or eating.  She denied any associated shortness of breath.  Patient denied any PND, orthopnea, and peripheral edema.  Patient with fatigue, or palpitations, and  frequent lightheadedness.  She endorsed an overall poor appetite and decreased oral fluid intake.  Past medical history significant for cardiovascular diagnoses. ? ?Myocardial perfusion imaging study performed on 06/25/2021 revealing a normal left ventricular systolic function with an EF of 55-65%.  There was a mild perfusion defect present in the mid anterior and apical anterior location consistent with possible LAD ischemia.  Study determined to be suboptimal due to GI uptake as well as bundle branch block.  Attenuation correction imaging did not reveal any significant aortic or coronary calcifications. ? ?Diagnostic left heart catheterization performed on 07/02/2021 revealing angiographically normal coronary anatomy.  Intra procedurally, a 35-40% stenosis was noted within the proximal RCA secondary to coronary vasospasm, which resolved with intracoronary NTG.  Procedure complicated by episode of ventricular fibrillation precipitated by intracoronary NTG injection.  Patient was defibrillated x1 converting to NSR with no recurrence. ? ?Following diagnostic left heart catheterization, patient developed a RIGHT radial artery occlusion from the puncture site extending up the entire forearm.  Patient anticoagulated using enoxaparin 1 mg/kg twice a day for 4 weeks.  Repeat ultrasound performed on 08/15/2021 revealing a thrombus in the proximal radial artery with minimal reconstitution of flow; improved with multiphasic waveforms and the RIGHT brachial artery. ? ?Most recent TTE was performed on 02/07/2022 revealing moderately decreased left ventricular systolic function with an EF of 35-40%.  There was akinesis of the basal mid anteroseptal wall. Diastolic Doppler parameters consistent with pseudonormalization (G2DD).  There was mild mitral valve regurgitation.  There was no evidence of a significant transvalvular gradient to suggest stenosis. ? ?Blood pressure well controlled at 98/70 on currently prescribed  beta-blocker monotherapy.  Patient not currently taking any type of lipid-lowering therapies for her HLD or ASCVD prevention; diagnoses being controlled with  diet and lifestyle modification.  She is not diabetic.  Functional capacity limited somewhat by fatigue, however patient still felt to be able to achieve at least 4 METS of activity without angina/anginal equivalent symptoms.  No changes were made to her medication regimen. Patient followed up with outpatient cardiology at defined intervals for ongoing management of her cardiovascular diagnoses. ? ?Caroline Conley is scheduled for an elective RIGHT INGUINAL HERNIA REPAIR on 04/17/2022 with Dr. Hervey Ard, MD.  Given patient's past medical history significant for cardiovascular diagnoses, presurgical cardiac clearance was sought by the PAT team. Per cardiology, "RCRI: 6.6%. The patient affirms she has been doing well without any new cardiac symptoms. They are able to achieve 5 METS without cardiac limitations. Therefore, based on ACC/AHA guidelines, the patient would be at ACCEPTABLE risk for the planned procedure without further cardiovascular testing".  In review of her medication reconciliation, it is noted the patient is not currently taking any type of anticoagulation or antiplatelet therapies that will need to be held during her perioperative course. ? ?Patient denies previous perioperative complications with anesthesia in the past. In review of the available records, it is noted that patient underwent a general anesthetic course here at [facility]) (ASA II) in 12/2021 without documented complications.  ? ? ?  04/10/2022  ? 12:04 PM 03/14/2022  ? 11:25 AM 03/14/2022  ? 11:15 AM  ?Vitals with BMI  ?Height '5\' 6"'$  5' 6.5" 5' 6.5"  ?Weight 132 lbs 132 lbs 132 lbs  ?BMI 21.32 20.99 20.99  ?Systolic  98 98  ?Diastolic  70 70  ?Pulse  56   ? ? ?Providers/Specialists:  ? ?NOTE: Primary physician provider listed below.  Patient may have been seen by APP or partner within same practice.  ? ?PROVIDER ROLE / SPECIALTY LAST OV  ?Robert Bellow, MD General Surgery (Surgeon) 04/02/2022  ?Olin Hauser, DO Primary Care Provider 03/14/2022  ?[name], MD Cardiology 02/07/2022  ? ?Allergies:  ?2,4-d dimethylamine; Morphine and related; and Vicodin [hydrocodone-acetaminophen] ? ?Current Home Medications:  ? ?No current facility-administered medications for this encounter.  ? ? albuterol (PROVENTIL) (2.5 MG/3ML) 0.083% nebulizer solution  ? chlorpheniramine-HYDROcodone (TUSSIONEX PENNKINETIC ER) 10-8 MG/5ML  ? magic mouthwash w/lidocaine SOLN  ? metoprolol succinate (TOPROL-XL) 25 MG 24 hr tablet  ? omeprazole (PRILOSEC) 40 MG capsule  ? oxyCODONE-acetaminophen (PERCOCET) 10-325 MG tablet  ? promethazine (PHENERGAN) 25 MG tablet  ? roflumilast (DALIRESP) 500 MCG TABS tablet  ? TRELEGY ELLIPTA 100-62.5-25 MCG/ACT AEPB  ? albuterol (VENTOLIN HFA) 108 (90 Base) MCG/ACT inhaler  ? fluconazole (DIFLUCAN) 150 MG tablet  ? Misc. Devices (PULSE OXIMETER FOR FINGER) MISC  ? ?History:  ? ?Past Medical History:  ?Diagnosis Date  ? Atypical chest pain   ? a. 2015 Cath ([place]): Nl cors. Nl EF; b. 10/2018 MV: No ischemia/infarct. EF>65%.  ? COPD (chronic obstructive pulmonary disease) ([place])   ? Coronary vasospasm (HCC)   ? a. 06/2021 Cath: LM nl, LADnl, LCX large/nl, RCA 35p (spasm-->tx w/ IC NTG w/ resultant VF req defib).  ? Dysplastic nevus 02/02/2015  ? R upper back - excision  ? Dysplastic nevus 02/16/2015  ? L hip - excision  ? Dysplastic nevus 08/24/2015  ? L upper abdomen  ? Dysplastic nevus 08/11/2017  ? mid abdomen - excised  ? Dysplastic nevus 07/29/2017  ? Left neck  ? Full dentures   ? Gastritis   ? GERD (gastroesophageal reflux disease)   ?  HFrEF (heart failure with reduced ejection fraction) ([place])   ? Hyperlipidemia   ? Iatrogenic ventricular fibrillation 07/02/2021  ? a.) during [place] --> coronary vasospasm Tx'd with  IC NTG resulting in VF requiring defibrillation; no recurrence.  ? LBBB (left bundle branch block)   ? Melanoma ([place]) 08/24/2015  ? L buttocks - superficial spreading  ? NICM (nonischemic cardiomyopathy) ([place])   ? a.

## 2022-04-15 ENCOUNTER — Encounter: Payer: Self-pay | Admitting: Nurse Practitioner

## 2022-04-15 ENCOUNTER — Telehealth: Payer: Self-pay | Admitting: *Deleted

## 2022-04-15 ENCOUNTER — Ambulatory Visit (INDEPENDENT_AMBULATORY_CARE_PROVIDER_SITE_OTHER): Payer: 59 | Admitting: Nurse Practitioner

## 2022-04-15 DIAGNOSIS — Z01810 Encounter for preprocedural cardiovascular examination: Secondary | ICD-10-CM | POA: Diagnosis not present

## 2022-04-15 NOTE — Telephone Encounter (Signed)
Patient is following up requesting to speak with pre-op to further discuss 5/03 procedure as advised. Pre-op clinical staff unavailable for assistance at this time. Please return call as able. ?

## 2022-04-15 NOTE — Progress Notes (Addendum)
?  Perioperative Services ?Pre-Admission/Anesthesia Testing ? ?  ?Date: 04/15/22 ? ?Name: Caroline Conley ?MRN:   [id number] ? ?Re: Need to speak with cardiology team prior to surgery ? ?Patient is scheduled to undergo surgical hernia repair on 04/17/2022 with Dr. Hervey Ard, MD. received communication from surgeon regarding clarification on the need for cardiovascular clearance.  Return communication to the provider was sent by PAT APP outlining cardiovascular history and plans from last cardiology visit. Discussed attempts to ensure patient's safety by having cardiology service sign off on patient prior to proceeding with elective surgical intervention. ? ?Call placed to patient at approximately 0955 AM on 04/15/2022.  I spoke directly with the patient and advised her of need to return call to her cardiology team to discuss upcoming surgery.  Patient was made aware that clearance for surgery was pending, and that her team needed to conduct a brief 5-10 minute call with her to discuss interval history since last visit.  Patient verbalized understanding and ensured me that she would get this taken care of this morning.  Message sent to cardiology clinic to advise them that I made contact with patient and that she advised that she will be calling today.  Surgeon was also made aware that I have made contact with patient and asked her to follow-up with cardiology via phone today.   ? ?Will plan on touching base with Dr. Tollie Pizza following cardiology visit to ensure him that patient is cleared to proceed with elective surgical intervention as planned. ? ?Honor Loh, MSN, APRN, FNP-C, CEN ?[place]  ?Peri-operative Services Nurse Practitioner ?Phone: [phone] ?04/15/22 10:24 AM ? ?NOTE: This note has been prepared using [employer]. Despite my best ability to proofread, there is always the potential that unintentional transcriptional errors may still occur from this process. ? ?

## 2022-04-15 NOTE — Telephone Encounter (Addendum)
Left message for the pt to call the office ASAP as she is going to need a tele pre op appt, see previous notes. Procedure is set for 04/17/22. At this time this may need to be postponed until she has been cleared by cardiology. I will send FYI to requesting office the let them know we have attempted to reach the pt x 2 to set up a telephone pre op appt. Pt does have an in office appt with Dr. Saunders Revel on May 10th already.  ?

## 2022-04-15 NOTE — Telephone Encounter (Signed)
Pt agreeable to plan of care for tele pre op appt today at 4 pm. Med rec and consent are done. I will update the requesting office pt has tele pre op appt today at 4 pm.  ? ?  ?Patient Consent for Virtual Visit  ? ? ?   ? ?Caroline Conley has provided verbal consent on 04/15/2022 for a virtual visit (video or telephone). ? ? ?CONSENT FOR VIRTUAL VISIT FOR:  Caroline Conley  ?By participating in this virtual visit I agree to the following: ? ?I hereby voluntarily request, consent and authorize [facility] and its employed or contracted physicians, physician assistants, nurse practitioners or other licensed health care professionals (the Practitioner), to provide me with telemedicine health care services (the ?Services") as deemed necessary by the treating Practitioner. I acknowledge and consent to receive the Services by the Practitioner via telemedicine. I understand that the telemedicine visit will involve communicating with the Practitioner through live audiovisual communication technology and the disclosure of certain medical information by electronic transmission. I acknowledge that I have been given the opportunity to request an in-person assessment or other available alternative prior to the telemedicine visit and am voluntarily participating in the telemedicine visit. ? ?I understand that I have the right to withhold or withdraw my consent to the use of telemedicine in the course of my care at any time, without affecting my right to future care or treatment, and that the Practitioner or I may terminate the telemedicine visit at any time. I understand that I have the right to inspect all information obtained and/or recorded in the course of the telemedicine visit and may receive copies of available information for a reasonable fee.  I understand that some of the potential risks of receiving the Services via telemedicine include:  ?Delay or interruption in medical evaluation due to technological equipment  failure or disruption; ?Information transmitted may not be sufficient (e.g. poor resolution of images) to allow for appropriate medical decision making by the Practitioner; and/or  ?In rare instances, security protocols could fail, causing a breach of personal health information. ? ?Furthermore, I acknowledge that it is my responsibility to provide information about my medical history, conditions and care that is complete and accurate to the best of my ability. I acknowledge that Practitioner's advice, recommendations, and/or decision may be based on factors not within their control, such as incomplete or inaccurate data provided by me or distortions of diagnostic images or specimens that may result from electronic transmissions. I understand that the practice of medicine is not an exact science and that Practitioner makes no warranties or guarantees regarding treatment outcomes. I acknowledge that a copy of this consent can be made available to me via my patient portal ([place]), or I can request a printed copy by calling the office of [place].   ? ?I understand that my insurance will be billed for this visit.  ? ?I have read or had this consent read to me. ?I understand the contents of this consent, which adequately explains the benefits and risks of the Services being provided via telemedicine.  ?I have been provided ample opportunity to ask questions regarding this consent and the Services and have had my questions answered to my satisfaction. ?I give my informed consent for the services to be provided through the use of telemedicine in my medical care ? ? ? ?

## 2022-04-15 NOTE — Telephone Encounter (Signed)
Pt agreeable to plan of care for tele pre op appt today at 4 pm. Med rec and consent are done. I will update the requesting office pt has tele pre op appt today at 4 pm.  ?

## 2022-04-15 NOTE — Progress Notes (Signed)
? ?Virtual Visit via Telephone Note  ? ?This visit type was conducted due to national recommendations for restrictions regarding the COVID-19 Pandemic (e.g. social distancing) in an effort to limit this patient's exposure and mitigate transmission in our community.  Due to her co-morbid illnesses, this patient is at least at moderate risk for complications without adequate follow up.  This format is felt to be most appropriate for this patient at this time.  The patient did not have access to video technology/had technical difficulties with video requiring transitioning to audio format only (telephone).  All issues noted in this document were discussed and addressed.  No physical exam could be performed with this format.  Please refer to the patient's chart for her  consent to telehealth for [facility]. ? ?Evaluation Performed:  Preoperative cardiovascular risk assessment ?_____________  ? ?Date:  04/15/2022  ? ?Patient ID:  Caroline Conley, Caroline Conley 06-22-1966, MRN [id number] ?Patient Location:  ?Home ?Provider location:   ?Office ? ?Primary Care Provider:  Olin Hauser, DO ?Primary Cardiologist:  [name], MD ? ?Chief Complaint  ?  ?56 y.o. y/o female with a h/o nonischemic cardiomyopathy, LBBB, atypical chest pain, iatrogenic ventricular fibrillation with RCA injection 06/2021, COPD HFrEF, who is pending inguinal hernia repair, and presents today for telephonic preoperative cardiovascular risk assessment. ? ?Past Medical History  ?  ?Past Medical History:  ?Diagnosis Date  ? Atypical chest pain   ? a. 2015 Cath ([place]): Nl cors. Nl EF; b. 10/2018 MV: No ischemia/infarct. EF>65%.  ? COPD (chronic obstructive pulmonary disease) ([place])   ? Coronary vasospasm (HCC)   ? a. 06/2021 Cath: LM nl, LADnl, LCX large/nl, RCA 35p (spasm-->tx w/ IC NTG w/ resultant VF req defib).  ? Dysplastic nevus 02/02/2015  ? R upper back - excision  ? Dysplastic nevus 02/16/2015  ? L hip - excision  ? Dysplastic nevus  08/24/2015  ? L upper abdomen  ? Dysplastic nevus 08/11/2017  ? mid abdomen - excised  ? Dysplastic nevus 07/29/2017  ? Left neck  ? Full dentures   ? Gastritis   ? GERD (gastroesophageal reflux disease)   ? HFrEF (heart failure with reduced ejection fraction) ([place])   ? Hyperlipidemia   ? Iatrogenic ventricular fibrillation 07/02/2021  ? a.) during [place] --> coronary vasospasm Tx'd with IC NTG resulting in VF requiring defibrillation; no recurrence.  ? LBBB (left bundle branch block)   ? Melanoma ([place]) 08/24/2015  ? L buttocks - superficial spreading  ? NICM (nonischemic cardiomyopathy) ([place])   ? a.) 08/04/2018: EF 40%; ant/anterosepal HK, GLS -13.7; mild-mod MR; G1DD.  b.) 06/22/2021 TTE: EF 40%, ant/antseptal/septal HK. G1DD. Nl RV fxn. Mild-mod MR; c.) 06/2021 LHC: Nl cors w/ RCA vasospasm (35%). VF w/ IC NTG admin req defib; d.) 07/02/2021 TTE: EF 35-40%. Glob HK. Mild MR. e.) TTE 02/07/2022: EF 35-40%; basal mid-anteroseptal AK; mild MR; G2DD.  ? Occlusion of right radial artery ([place])   ? a. 06/2021 s/p cardiac cath-->u/s w/ R radial occlusion from puncture extending up entire forearm-->Lovenox '1mg'$ /kg BID x 4 wks; b. 07/2021 U/S: Thrombus in prox Radial artery w/ minimal reconstitution of flow - improved w/ multiphasic waveforms in R brachial artery.  ? Palpitations   ? a. 07/2018 48h Holter: predominantly RSR, avg rate 106 (69-166 bpm), Rare PAC's and occas PVC's. 2 atrial runs up to 5 beats. No sustained arrhythmias or prolonged pauses. No symptoms reported; b. 07/2021 Zio: Avg HR 102 bpm, intermittent bundle branch block, rare  PACs and PVCs, 9 atrial runs, longest 13.7 secs w/ max HR of 226 bpm.  ? Skin cancer, basal cell 01/2016  ? ?Past Surgical History:  ?Procedure Laterality Date  ? ABDOMINAL HYSTERECTOMY    ? APPENDECTOMY    ? BACK SURGERY    ? repeat lumbar herniated disc; apply cage  ? BUNIONECTOMY Right   ? CARDIAC CATHETERIZATION  09/20/2014  ? [facility] - Dr. Clayborn Bigness  ? CHOLECYSTECTOMY    ? COLONOSCOPY WITH  PROPOFOL N/A 07/08/2016  ? Procedure: COLONOSCOPY WITH PROPOFOL;  Surgeon: Lucilla Lame, MD;  Location: [place] [facility];  Service: Endoscopy;  Laterality: N/A;  ? DEBRIDEMENT TENNIS ELBOW    ? ESOPHAGOGASTRODUODENOSCOPY  09/16/2014  ? HARDWARE REMOVAL Left 01/04/2022  ? Procedure: HARDWARE REMOVAL;  Surgeon: Sharlotte Alamo, DPM;  Location: [facility] ORS;  Service: Podiatry;  Laterality: Left;  ? LEFT HEART CATH AND CORONARY ANGIOGRAPHY N/A 07/02/2021  ? Procedure: LEFT HEART CATH AND CORONARY ANGIOGRAPHY;  Surgeon: [name], MD;  Location: [facility] CV [facility];  Service: Cardiovascular;  Laterality: N/A;  ? LUMBAR DISC SURGERY    ? herniated disc  ? LUMBAR DISC SURGERY    ? took screws out from previous lumbar disc surgery  ? METATARSAL OSTEOTOMY  05/18/2013  ? ROTATOR CUFF REPAIR Right   ? TONSILLECTOMY    ? ? ?Allergies ? ?Allergies  ?Allergen Reactions  ? 2,4-D Dimethylamine Itching  ?  (Pt unsure of this allergy)  ? Morphine And Related Itching  ? Vicodin [Hydrocodone-Acetaminophen] Nausea And Vomiting  ?  GI distress  ? ? ?History of Present Illness  ?  ?Caroline Conley is a 56 y.o. female who presents via audio/video conferencing for a telehealth visit today.  Pt was last seen in cardiology clinic on 02/07/2022 by Dr End.  At that time Caroline Conley was doing better and had presented for follow-up from recent hospitalization for sepsis.  She was also having symptoms of chest pain and tachycardia.  She was started on colchicine twice daily.  The patient is now pending hernia repair. Since her last visit, she denies any chest pain or shortness of breath presently. She states that she is currently feeling well and has no complaints currently at this time.  Her heart rate was below 98 at a subsequent visit 2 weeks ago. ? ?Home Medications  ?  ?Prior to Admission medications   ?Medication Sig Start Date End Date Taking? Authorizing Provider  ?albuterol (PROVENTIL) (2.5 MG/3ML) 0.083% nebulizer solution Take 2.5 mg  by nebulization every 6 (six) hours as needed for shortness of breath or wheezing.    [provider]  ?albuterol (VENTOLIN HFA) 108 (90 Base) MCG/ACT inhaler INHALE 2 PUFFS INTO THE LUNGS EVERY 4 HOURS AS NEEDED FOR WHEEZING 04/11/22   Parks Ranger, Devonne Doughty, DO  ?chlorpheniramine-HYDROcodone (TUSSIONEX PENNKINETIC ER) 10-8 MG/5ML Take 5 mLs by mouth every 12 (twelve) hours as needed for cough. 02/26/22   Karamalegos, Devonne Doughty, DO  ?fluconazole (DIFLUCAN) 150 MG tablet TAKE 1 TABLET BY MOUTH ON DAY 1 . REPEAT2ND DOSE ON DAY 3 04/11/22   Karamalegos, Devonne Doughty, DO  ?magic mouthwash w/lidocaine SOLN Swish, gargle, and spit one to two teaspoonfuls every six hours as needed. Shake well before using. 10/29/21   Karamalegos, Devonne Doughty, DO  ?metoprolol succinate (TOPROL-XL) 25 MG 24 hr tablet Take 0.5 tablets (12.5 mg total) by mouth daily. 01/17/22 01/12/23  End, Harrell Gave, MD  ?[place]. Devices (PULSE OXIMETER FOR FINGER) MISC 1 Device by  Does not apply route as needed (for cough, dyspnea). Pulse oximeter for finger for checking oxygen saturation in COPD 11/24/19   Olin Hauser, DO  ?omeprazole (PRILOSEC) 40 MG capsule Take 1 capsule (40 mg total) by mouth daily before breakfast. 01/28/22   Parks Ranger, Devonne Doughty, DO  ?oxyCODONE-acetaminophen (PERCOCET) 10-325 MG tablet Take 1 tablet by mouth every 4 (four) hours.    [provider]  ?promethazine (PHENERGAN) 25 MG tablet Take 0.5-1 tablets (12.5-25 mg total) by mouth every 8 (eight) hours as needed for nausea or vomiting. 01/24/22   Karamalegos, Devonne Doughty, DO  ?roflumilast (DALIRESP) 500 MCG TABS tablet Take 1 tablet (500 mcg total) by mouth daily. 06/06/21   Flora Lipps, MD  ?Donnal Debar 100-62.5-25 MCG/ACT AEPB INHALE 1 PUFF BY MOUTH ONCE DAILY 01/24/22   Olin Hauser, DO  ? ? ?Physical Exam  ?  ?Vital Signs:  Caroline Conley does not have vital signs available for review today. ? ?Given telephonic nature of communication,  physical exam is limited. ?AAOx3. NAD. Normal affect.  Speech and respirations are unlabored. ? ?Accessory Clinical Findings  ?  ?None ? ?Assessment & Plan  ?  ?1.  Preoperative Cardiovascular Risk Assessmen

## 2022-04-17 ENCOUNTER — Encounter: Payer: Self-pay | Admitting: General Surgery

## 2022-04-17 ENCOUNTER — Ambulatory Visit: Payer: 59 | Admitting: Urgent Care

## 2022-04-17 ENCOUNTER — Other Ambulatory Visit: Payer: Self-pay

## 2022-04-17 ENCOUNTER — Encounter
Admission: RE | Disposition: A | Discharge status: Home / Self Care | Payer: Self-pay | Source: Ambulatory Visit | Attending: General Surgery

## 2022-04-17 ENCOUNTER — Ambulatory Visit
Admission: RE | Admit: 2022-04-17 | Discharge: 2022-04-17 | Disposition: A | Discharge status: Home / Self Care | Payer: 59 | Source: Ambulatory Visit | Attending: General Surgery | Admitting: General Surgery

## 2022-04-17 DIAGNOSIS — I509 Heart failure, unspecified: Secondary | ICD-10-CM | POA: Insufficient documentation

## 2022-04-17 DIAGNOSIS — F17210 Nicotine dependence, cigarettes, uncomplicated: Secondary | ICD-10-CM | POA: Insufficient documentation

## 2022-04-17 DIAGNOSIS — I2510 Atherosclerotic heart disease of native coronary artery without angina pectoris: Secondary | ICD-10-CM | POA: Insufficient documentation

## 2022-04-17 DIAGNOSIS — K219 Gastro-esophageal reflux disease without esophagitis: Secondary | ICD-10-CM | POA: Insufficient documentation

## 2022-04-17 DIAGNOSIS — I428 Other cardiomyopathies: Secondary | ICD-10-CM | POA: Insufficient documentation

## 2022-04-17 DIAGNOSIS — K4090 Unilateral inguinal hernia, without obstruction or gangrene, not specified as recurrent: Secondary | ICD-10-CM | POA: Insufficient documentation

## 2022-04-17 DIAGNOSIS — J449 Chronic obstructive pulmonary disease, unspecified: Secondary | ICD-10-CM | POA: Insufficient documentation

## 2022-04-17 DIAGNOSIS — E785 Hyperlipidemia, unspecified: Secondary | ICD-10-CM | POA: Diagnosis not present

## 2022-04-17 HISTORY — DX: Left bundle-branch block, unspecified: I44.7

## 2022-04-17 HISTORY — DX: Unspecified systolic (congestive) heart failure: I50.20

## 2022-04-17 HISTORY — DX: Complete loss of teeth, unspecified cause, unspecified class: K08.109

## 2022-04-17 HISTORY — PX: INGUINAL HERNIA REPAIR: SHX194

## 2022-04-17 SURGERY — REPAIR, HERNIA, INGUINAL, ADULT
Anesthesia: General | Laterality: Right

## 2022-04-17 MED ORDER — MIDAZOLAM HCL 2 MG/2ML IJ SOLN
INTRAMUSCULAR | Status: AC
  Filled 2022-04-17: qty 2

## 2022-04-17 MED ORDER — MIDAZOLAM HCL 2 MG/2ML IJ SOLN
INTRAMUSCULAR | Status: DC | PRN
  Administered 2022-04-17: 2 mg via INTRAVENOUS

## 2022-04-17 MED ORDER — OXYCODONE HCL 5 MG/5ML PO SOLN: 5.0000 mg | Freq: Once | ORAL | Status: AC | PRN

## 2022-04-17 MED ORDER — CHLORHEXIDINE GLUCONATE 0.12 % MT SOLN
OROMUCOSAL | Status: AC
  Administered 2022-04-17: 15 mL via OROMUCOSAL
  Filled 2022-04-17: qty 15

## 2022-04-17 MED ORDER — FENTANYL CITRATE (PF) 100 MCG/2ML IJ SOLN
25.0000 ug | INTRAMUSCULAR | Status: DC | PRN
  Administered 2022-04-17: 25 ug via INTRAVENOUS

## 2022-04-17 MED ORDER — CHLORHEXIDINE GLUCONATE 0.12 % MT SOLN: 15.0000 mL | Freq: Once | OROMUCOSAL | Status: AC

## 2022-04-17 MED ORDER — BUPIVACAINE-EPINEPHRINE (PF) 0.5% -1:200000 IJ SOLN
INTRAMUSCULAR | Status: DC | PRN
  Administered 2022-04-17: 20 mL

## 2022-04-17 MED ORDER — FENTANYL CITRATE (PF) 100 MCG/2ML IJ SOLN
INTRAMUSCULAR | Status: AC
  Filled 2022-04-17: qty 2

## 2022-04-17 MED ORDER — ACETAMINOPHEN 10 MG/ML IV SOLN: 1000.0000 mg | Freq: Once | INTRAVENOUS | Status: DC | PRN

## 2022-04-17 MED ORDER — ONDANSETRON HCL 4 MG/2ML IJ SOLN
INTRAMUSCULAR | Status: AC
  Filled 2022-04-17: qty 2

## 2022-04-17 MED ORDER — ACETAMINOPHEN 10 MG/ML IV SOLN
INTRAVENOUS | Status: DC | PRN
  Administered 2022-04-17: 1000 mg via INTRAVENOUS

## 2022-04-17 MED ORDER — ONDANSETRON HCL 4 MG/2ML IJ SOLN: 4.0000 mg | Freq: Once | INTRAMUSCULAR | Status: DC | PRN

## 2022-04-17 MED ORDER — KETOROLAC TROMETHAMINE 30 MG/ML IJ SOLN
INTRAMUSCULAR | Status: DC | PRN
  Administered 2022-04-17: 30 mg via INTRAVENOUS

## 2022-04-17 MED ORDER — LIDOCAINE HCL (PF) 2 % IJ SOLN
INTRAMUSCULAR | Status: AC
  Filled 2022-04-17: qty 5

## 2022-04-17 MED ORDER — KETOROLAC TROMETHAMINE 30 MG/ML IJ SOLN
INTRAMUSCULAR | Status: AC
Start: 2022-04-17 — End: ?
  Filled 2022-04-17: qty 1

## 2022-04-17 MED ORDER — CHLORHEXIDINE GLUCONATE CLOTH 2 % EX PADS
6.0000 | MEDICATED_PAD | Freq: Once | CUTANEOUS | Status: AC
  Administered 2022-04-17: 6 via TOPICAL

## 2022-04-17 MED ORDER — BUPIVACAINE-EPINEPHRINE (PF) 0.5% -1:200000 IJ SOLN
INTRAMUSCULAR | Status: AC
  Filled 2022-04-17: qty 30

## 2022-04-17 MED ORDER — ORAL CARE MOUTH RINSE: 15.0000 mL | Freq: Once | OROMUCOSAL | Status: AC

## 2022-04-17 MED ORDER — CEFAZOLIN SODIUM-DEXTROSE 2-4 GM/100ML-% IV SOLN
2.0000 g | INTRAVENOUS | Status: AC
  Administered 2022-04-17: 2 g via INTRAVENOUS

## 2022-04-17 MED ORDER — DEXAMETHASONE SODIUM PHOSPHATE 10 MG/ML IJ SOLN
INTRAMUSCULAR | Status: DC | PRN
  Administered 2022-04-17: 10 mg via INTRAVENOUS

## 2022-04-17 MED ORDER — ONDANSETRON HCL 4 MG/2ML IJ SOLN
INTRAMUSCULAR | Status: DC | PRN
  Administered 2022-04-17: 4 mg via INTRAVENOUS

## 2022-04-17 MED ORDER — LIDOCAINE HCL (CARDIAC) PF 100 MG/5ML IV SOSY
PREFILLED_SYRINGE | INTRAVENOUS | Status: DC | PRN
  Administered 2022-04-17: 60 mg via INTRAVENOUS

## 2022-04-17 MED ORDER — TRAMADOL HCL 50 MG PO TABS: 50.0000 mg | ORAL_TABLET | ORAL | 0 refills | Status: DC | PRN

## 2022-04-17 MED ORDER — PHENYLEPHRINE HCL (PRESSORS) 10 MG/ML IV SOLN
INTRAVENOUS | Status: DC | PRN
  Administered 2022-04-17 (×6): 160 ug via INTRAVENOUS

## 2022-04-17 MED ORDER — LACTATED RINGERS IV SOLN: INTRAVENOUS | Status: DC

## 2022-04-17 MED ORDER — 0.9 % SODIUM CHLORIDE (POUR BTL) OPTIME
TOPICAL | Status: DC | PRN
  Administered 2022-04-17: 500 mL

## 2022-04-17 MED ORDER — OXYCODONE HCL 5 MG PO TABS
5.0000 mg | ORAL_TABLET | Freq: Once | ORAL | Status: AC | PRN
  Administered 2022-04-17: 5 mg via ORAL

## 2022-04-17 MED ORDER — CHLORHEXIDINE GLUCONATE CLOTH 2 % EX PADS: 6.0000 | MEDICATED_PAD | Freq: Once | CUTANEOUS | Status: DC

## 2022-04-17 MED ORDER — CEFAZOLIN SODIUM-DEXTROSE 2-4 GM/100ML-% IV SOLN
INTRAVENOUS | Status: AC
  Filled 2022-04-17: qty 100

## 2022-04-17 MED ORDER — PROPOFOL 10 MG/ML IV BOLUS
INTRAVENOUS | Status: DC | PRN
  Administered 2022-04-17: 140 mg via INTRAVENOUS

## 2022-04-17 MED ORDER — PHENYLEPHRINE 80 MCG/ML (10ML) SYRINGE FOR IV PUSH (FOR BLOOD PRESSURE SUPPORT)
PREFILLED_SYRINGE | INTRAVENOUS | Status: AC
  Filled 2022-04-17: qty 10

## 2022-04-17 MED ORDER — OXYCODONE HCL 5 MG PO TABS
ORAL_TABLET | ORAL | Status: AC
  Filled 2022-04-17: qty 1

## 2022-04-17 MED ORDER — PROPOFOL 10 MG/ML IV BOLUS
INTRAVENOUS | Status: AC
  Filled 2022-04-17: qty 20

## 2022-04-17 MED ORDER — ACETAMINOPHEN 10 MG/ML IV SOLN
INTRAVENOUS | Status: AC
  Filled 2022-04-17: qty 100

## 2022-04-17 MED ORDER — DEXAMETHASONE SODIUM PHOSPHATE 10 MG/ML IJ SOLN
INTRAMUSCULAR | Status: AC
  Filled 2022-04-17: qty 1

## 2022-04-17 SURGICAL SUPPLY — 36 items
APL PRP STRL LF DISP 70% ISPRP (MISCELLANEOUS) ×1
BLADE SURG 15 STRL SS SAFETY (BLADE) ×4 IMPLANT
CHLORAPREP W/TINT 26 (MISCELLANEOUS) ×2 IMPLANT
DRAIN PENROSE 12X.25 LTX STRL (MISCELLANEOUS) ×2 IMPLANT
DRAPE LAPAROTOMY 100X77 ABD (DRAPES) ×2 IMPLANT
DRSG TEGADERM 4X4.75 (GAUZE/BANDAGES/DRESSINGS) ×2 IMPLANT
DRSG TELFA 4X3 1S NADH ST (GAUZE/BANDAGES/DRESSINGS) ×2 IMPLANT
ELECT REM PT RETURN 9FT ADLT (ELECTROSURGICAL) ×2
ELECTRODE REM PT RTRN 9FT ADLT (ELECTROSURGICAL) ×1 IMPLANT
GAUZE 4X4 16PLY ~~LOC~~+RFID DBL (SPONGE) ×2 IMPLANT
GLOVE SURG ENC MOIS LTX SZ7.5 (GLOVE) ×3 IMPLANT
GLOVE SURG UNDER LTX SZ8 (GLOVE) ×3 IMPLANT
GOWN STRL REUS W/ TWL LRG LVL3 (GOWN DISPOSABLE) ×2 IMPLANT
GOWN STRL REUS W/TWL LRG LVL3 (GOWN DISPOSABLE) ×4
KIT TURNOVER KIT A (KITS) ×2 IMPLANT
LABEL OR SOLS (LABEL) ×2 IMPLANT
MANIFOLD NEPTUNE II (INSTRUMENTS) ×2 IMPLANT
MESH MARLEX PLUG MEDIUM (Mesh General) ×1 IMPLANT
NEEDLE HYPO 22GX1.5 SAFETY (NEEDLE) ×4 IMPLANT
PACK BASIN MINOR ARMC (MISCELLANEOUS) ×2 IMPLANT
PENCIL ELECTRO HAND CTR (MISCELLANEOUS) ×1 IMPLANT
SPIKE FLUID TRANSFER (MISCELLANEOUS) ×2 IMPLANT
STRIP CLOSURE SKIN 1/2X4 (GAUZE/BANDAGES/DRESSINGS) ×2 IMPLANT
SUT PDS AB 0 CT1 27 (SUTURE) IMPLANT
SUT SURGILON 0 BLK (SUTURE) ×2 IMPLANT
SUT VIC AB 2-0 SH 27 (SUTURE) ×2
SUT VIC AB 2-0 SH 27XBRD (SUTURE) ×1 IMPLANT
SUT VIC AB 3-0 54X BRD REEL (SUTURE) ×1 IMPLANT
SUT VIC AB 3-0 BRD 54 (SUTURE) ×2
SUT VIC AB 3-0 SH 27 (SUTURE)
SUT VIC AB 3-0 SH 27X BRD (SUTURE) ×1 IMPLANT
SUT VIC AB 4-0 FS2 27 (SUTURE) ×2 IMPLANT
SWABSTK COMLB BENZOIN TINCTURE (MISCELLANEOUS) ×2 IMPLANT
SYR 10ML LL (SYRINGE) ×2 IMPLANT
SYR 3ML LL SCALE MARK (SYRINGE) ×2 IMPLANT
WATER STERILE IRR 500ML POUR (IV SOLUTION) ×2 IMPLANT

## 2022-04-17 NOTE — Anesthesia Postprocedure Evaluation (Signed)
Anesthesia Post Note ? ?Patient: GLENDORA CLOUATRE ? ?Procedure(s) Performed: HERNIA REPAIR INGUINAL ADULT (Right) ? ?Patient location during evaluation: PACU ?Anesthesia Type: General ?Level of consciousness: awake and alert, oriented and patient cooperative ?Pain management: pain level controlled ?Vital Signs Assessment: post-procedure vital signs reviewed and stable ?Respiratory status: spontaneous breathing, nonlabored ventilation and respiratory function stable ?Cardiovascular status: blood pressure returned to baseline and stable ?Postop Assessment: adequate PO intake ?Anesthetic complications: no ? ? ?No notable events documented. ? ? ?Last Vitals:  ?Vitals:  ? 04/17/22 1329 04/17/22 1345  ?BP: 114/76 111/80  ?Pulse: 90 90  ?Resp: 14 16  ?Temp: 36.4 ?C (!) 36.3 ?C  ?SpO2: 95% 98%  ?  ?Last Pain:  ?Vitals:  ? 04/17/22 1345  ?TempSrc: Temporal  ?PainSc: 4   ? ? ?  ?  ?  ?  ?  ?  ? ?Darrin Nipper ? ? ? ? ?

## 2022-04-17 NOTE — H&P (Signed)
Caroline Conley ?[id number] ?1966-12-13 ? ?  ? ?HPI:  56 y/o with a symptomatic right inguinal hernia. For mesh repair.  ? ?Medications Prior to Admission  ?Medication Sig Dispense Refill Last Dose  ? albuterol (PROVENTIL) (2.5 MG/3ML) 0.083% nebulizer solution Take 2.5 mg by nebulization every 6 (six) hours as needed for shortness of breath or wheezing.   04/16/2022  ? albuterol (VENTOLIN HFA) 108 (90 Base) MCG/ACT inhaler INHALE 2 PUFFS INTO THE LUNGS EVERY 4 HOURS AS NEEDED FOR WHEEZING 8.5 g 3 04/17/2022 at 0830  ? chlorpheniramine-HYDROcodone (TUSSIONEX PENNKINETIC ER) 10-8 MG/5ML Take 5 mLs by mouth every 12 (twelve) hours as needed for cough. 140 mL 0 Past Week  ? fluconazole (DIFLUCAN) 150 MG tablet TAKE 1 TABLET BY MOUTH ON DAY 1 . REPEAT2ND DOSE ON DAY 3 6 tablet 3 Past Month  ? magic mouthwash w/lidocaine SOLN Swish, gargle, and spit one to two teaspoonfuls every six hours as needed. Shake well before using. 120 mL 0 Past Month  ? metoprolol succinate (TOPROL-XL) 25 MG 24 hr tablet Take 0.5 tablets (12.5 mg total) by mouth daily. 45 tablet 3 04/16/2022  ? omeprazole (PRILOSEC) 40 MG capsule Take 1 capsule (40 mg total) by mouth daily before breakfast. 90 capsule 1 04/16/2022  ? oxyCODONE-acetaminophen (PERCOCET) 10-325 MG tablet Take 1 tablet by mouth every 4 (four) hours.   04/17/2022 at 0730  ? promethazine (PHENERGAN) 25 MG tablet Take 0.5-1 tablets (12.5-25 mg total) by mouth every 8 (eight) hours as needed for nausea or vomiting. 20 tablet 0 Past Month  ? roflumilast (DALIRESP) 500 MCG TABS tablet Take 1 tablet (500 mcg total) by mouth daily. 30 tablet 5 Past Week  ? TRELEGY ELLIPTA 100-62.5-25 MCG/ACT AEPB INHALE 1 PUFF BY MOUTH ONCE DAILY 60 each 6 04/17/2022 at 0730  ? Misc. Devices (PULSE OXIMETER FOR FINGER) MISC 1 Device by Does not apply route as needed (for cough, dyspnea). Pulse oximeter for finger for checking oxygen saturation in COPD 1 each 0  at PRN  ? ?Allergies  ?Allergen Reactions  ? 2,4-D  Dimethylamine Itching  ?  (Pt unsure of this allergy) Herbacide? Patient denies having this allergy and states she has no idea why it was added to her chart.   ? Vicodin [Hydrocodone-Acetaminophen] Nausea And Vomiting  ?  GI distress  ? Morphine And Related Itching  ? ?Past Medical History:  ?Diagnosis Date  ? Atypical chest pain   ? a. 2015 Cath ([facility]): Nl cors. Nl EF; b. 10/2018 MV: No ischemia/infarct. EF>65%.  ? COPD (chronic obstructive pulmonary disease) ([place])   ? Coronary vasospasm ([facility])   ? a. 06/2021 Cath: LM nl, LADnl, LCX large/nl, RCA 35p (spasm-->tx w/ IC NTG w/ resultant VF req defib).  ? Dysplastic nevus 02/02/2015  ? R upper back - excision  ? Dysplastic nevus 02/16/2015  ? L hip - excision  ? Dysplastic nevus 08/24/2015  ? L upper abdomen  ? Dysplastic nevus 08/11/2017  ? mid abdomen - excised  ? Dysplastic nevus 07/29/2017  ? Left neck  ? Full dentures   ? Gastritis   ? GERD (gastroesophageal reflux disease)   ? HFrEF (heart failure with reduced ejection fraction) ([place])   ? Hyperlipidemia   ? Iatrogenic ventricular fibrillation 07/02/2021  ? a.) during [facility] --> coronary vasospasm Tx'd with IC NTG resulting in VF requiring defibrillation; no recurrence.  ? LBBB (left bundle branch block)   ? Melanoma ([place]) 08/24/2015  ? L buttocks - superficial  spreading  ? NICM (nonischemic cardiomyopathy) ([place])   ? a.) 08/04/2018: EF 40%; ant/anterosepal HK, GLS -13.7; mild-mod MR; G1DD.  b.) 06/22/2021 TTE: EF 40%, ant/antseptal/septal HK. G1DD. Nl RV fxn. Mild-mod MR; c.) 06/2021 LHC: Nl cors w/ RCA vasospasm (35%). VF w/ IC NTG admin req defib; d.) 07/02/2021 TTE: EF 35-40%. Glob HK. Mild MR. e.) TTE 02/07/2022: EF 35-40%; basal mid-anteroseptal AK; mild MR; G2DD.  ? Occlusion of right radial artery ([place])   ? a. 06/2021 s/p cardiac cath-->u/s w/ R radial occlusion from puncture extending up entire forearm-->Lovenox '1mg'$ /kg BID x 4 wks; b. 07/2021 U/S: Thrombus in prox Radial artery w/ minimal reconstitution of  flow - improved w/ multiphasic waveforms in R brachial artery.  ? Palpitations   ? a. 07/2018 48h Holter: predominantly RSR, avg rate 106 (69-166 bpm), Rare PAC's and occas PVC's. 2 atrial runs up to 5 beats. No sustained arrhythmias or prolonged pauses. No symptoms reported; b. 07/2021 Zio: Avg HR 102 bpm, intermittent bundle branch block, rare PACs and PVCs, 9 atrial runs, longest 13.7 secs w/ max HR of 226 bpm.  ? Skin cancer, basal cell 01/2016  ? ?Past Surgical History:  ?Procedure Laterality Date  ? ABDOMINAL HYSTERECTOMY    ? APPENDECTOMY    ? BACK SURGERY    ? repeat lumbar herniated disc; apply cage  ? BUNIONECTOMY Right   ? CARDIAC CATHETERIZATION  09/20/2014  ? [facility] - Dr. Clayborn Bigness  ? CHOLECYSTECTOMY    ? COLONOSCOPY WITH PROPOFOL N/A 07/08/2016  ? Procedure: COLONOSCOPY WITH PROPOFOL;  Surgeon: Lucilla Lame, MD;  Location: [facility];  Service: Endoscopy;  Laterality: N/A;  ? DEBRIDEMENT TENNIS ELBOW    ? ESOPHAGOGASTRODUODENOSCOPY  09/16/2014  ? HARDWARE REMOVAL Left 01/04/2022  ? Procedure: HARDWARE REMOVAL;  Surgeon: Sharlotte Alamo, DPM;  Location: [facility];  Service: Podiatry;  Laterality: Left;  ? LEFT HEART CATH AND CORONARY ANGIOGRAPHY N/A 07/02/2021  ? Procedure: LEFT HEART CATH AND CORONARY ANGIOGRAPHY;  Surgeon: [name], MD;  Location: [facility];  Service: Cardiovascular;  Laterality: N/A;  ? LUMBAR DISC SURGERY    ? herniated disc  ? LUMBAR DISC SURGERY    ? took screws out from previous lumbar disc surgery  ? METATARSAL OSTEOTOMY  05/18/2013  ? ROTATOR CUFF REPAIR Right   ? TONSILLECTOMY    ? ?Social History  ? ?Socioeconomic History  ? Marital status: Married  ?  Spouse name: Patrick Jupiter  ? Number of children: 2  ? Years of education: Not on file  ? Highest education level: Not on file  ?Occupational History  ? Occupation: disability  ?Tobacco Use  ? Smoking status: Former  ?  Packs/day: 1.00  ?  Years: 30.00  ?  Pack years: 30.00  ?  Types: Cigarettes  ?  Quit date: 03/02/2018   ?  Years since quitting: 4.1  ? Smokeless tobacco: Never  ? Tobacco comments:  ?  0.5 to 1 ppd >30 years  ?Vaping Use  ? Vaping Use: Never used  ?Substance and Sexual Activity  ? Alcohol use: No  ? Drug use: Yes  ?  Types: Oxycodone  ? Sexual activity: Yes  ?Other Topics Concern  ? Not on file  ?Social History Narrative  ? Not on file  ? ?Social Determinants of Health  ? ?Financial Resource Strain: Low Risk   ? Difficulty of Paying Living Expenses: Not very hard  ?Food Insecurity: No Food Insecurity  ? Worried About [employer] of  Food in the Last Year: Never true  ? Ran Out of Food in the Last Year: Never true  ?Transportation Needs: No Transportation Needs  ? Lack of Transportation (Medical): No  ? Lack of Transportation (Non-Medical): No  ?Physical Activity: Insufficiently Active  ? Days of Exercise per Week: 3 days  ? Minutes of Exercise per Session: 20 min  ?Stress: No Stress Concern Present  ? Feeling of Stress : Not at all  ?Social Connections: Moderately Integrated  ? Frequency of Communication with Friends and Family: More than three times a week  ? Frequency of Social Gatherings with Friends and Family: More than three times a week  ? Attends Religious Services: 1 to 4 times per year  ? Active Member of Clubs or Organizations: No  ? Attends Archivist Meetings: Never  ? Marital Status: Married  ?Intimate Partner Violence: Not on file  ? ?Social History  ? ?Social History Narrative  ? Not on file  ? ? ? ?ROS: Negative.  ? ? ? ?PE: ?HEENT: Negative. ?Lungs: Clear. ?Cardio: RR. ? ? ?Assessment/Plan: ? ?Proceed with planned right inguinal hernia repair.  ?Robert Bellow ?04/17/2022 ? ?  ?

## 2022-04-17 NOTE — Anesthesia Procedure Notes (Signed)
Procedure Name: LMA Insertion ?Date/Time: 04/17/2022 11:55 AM ?Performed by: Doreen Salvage, CRNA ?Pre-anesthesia Checklist: Patient identified, Patient being monitored, Timeout performed, Emergency Drugs available and Suction available ?Patient Re-evaluated:Patient Re-evaluated prior to induction ?Oxygen Delivery Method: Circle system utilized ?Preoxygenation: Pre-oxygenation with 100% oxygen ?Induction Type: IV induction ?Ventilation: Mask ventilation without difficulty ?LMA: LMA inserted ?LMA Size: 3.5 ?Tube type: Oral ?Number of attempts: 1 ?Placement Confirmation: positive ETCO2 and breath sounds checked- equal and bilateral ?Tube secured with: Tape ?Dental Injury: Teeth and Oropharynx as per pre-operative assessment  ? ? ? ? ?

## 2022-04-17 NOTE — Progress Notes (Signed)
Patient did well once getting to postop.  Pain minimal.  Dressing C/D/I.  Tolerating liquids and crackers.  Ambulated to the bathroom with little assistance and voided adequately.  Notified Dr. Bary Castilla that she was here and he said it was ok for her to leave when she felt ok.  IV was removed, discharge instructions reviewed with patient and cousin.  Patient left via wheelchair escorted by auxiliary.   ?

## 2022-04-17 NOTE — Op Note (Signed)
Preoperative diagnosis: Right inguinal hernia. ? ?Postoperative diagnosis: Same. ? ?Operative procedure: Right open inguinal hernia repair with medium PerFix plug and patch. ? ?Operating surgeon: Hervey Ard, MD. ? ?Anesthesia: General by LMA, Marcaine 0.5% with 1: 200,000's of epinephrine, 30 cc; Toradol: 30 mg. ? ?Estimated blood loss: Less than 5 cc. ? ?Clinical note: This 56 year old woman is developed symptomatic right inguinal hernia.  She is admitted for elective repair.  She received Ancef prior to the procedure.  Hair was removed from the surgical site with clippers.  SCD stockings for DVT prevention. ? ?Operative note: With the patient under adequate general anesthesia the area was cleansed with ChloraPrep and draped.  Field block anesthesia was established.  A 5 cm skin line incision along the anticipated course the inguinal canal was carried down through skin subtendinous tissue with hemostasis achieved by electrocautery.  The external oblique was opened in the direction of its fibers and the ilioinguinal iliohypogastric nerve was identified and protected.  A sizable indirect sac was returned to the preperitoneal space and the undersurface cleared.  A medium PerFix plug was placed and anchored to the iliopubic tract with a single 0 Surgilon figure-of-eight suture to minimize the chance for mesh migration.  An onlay was then placed anchored to the pubic tubercle and along the inguinal ligament with interrupted 0 Surgilon.  The superior and medial aspects were anchored to the transverse abdominis aponeurosis.  The nerves were returned to their bed.  The round ligament was divided.  The external oblique was closed with a running 2-0 Vicryl suture.  Scarpa's fascia was closed with a running 3-0 Vicryl suture.  Scarpa's fascia was closed with a 4-0 Vicryl subcuticular suture.  Benzoin, Steri-Strips, Telfa and Tegaderm dressings were applied. ? ?The patient tolerated the procedure well and was taken to the  PACU in stable condition. ?

## 2022-04-17 NOTE — Anesthesia Preprocedure Evaluation (Addendum)
Anesthesia Evaluation  ?Patient identified by MRN, date of birth, ID band ?Patient awake ? ? ? ?Reviewed: ?Allergy & Precautions, NPO status , Patient's Chart, lab work & pertinent test results ? ?History of Anesthesia Complications ?Negative for: history of anesthetic complications ? ?Airway ?Mallampati: I ? ? ?Neck ROM: Full ? ? ? Dental ? ?(+) Edentulous Upper, Edentulous Lower ?  ?Pulmonary ?COPD, Patient abstained from smoking., former smoker (quit 2019),  ?  ?Pulmonary exam normal ?breath sounds clear to auscultation ? ? ? ? ? ? Cardiovascular ?+ CAD and +CHF (NICM)  ?Normal cardiovascular exam ?Rhythm:Regular Rate:Normal ? ?Myocardial perfusion 06/25/21:  ?- Defect 1: There is a medium defect of mild severity present in the mid anterior and apical anterior location. ?- Findings consistent with possible LAD ischemia. ?- This is an intermediate risk study. ?- The left ventricular ejection fraction is normal (55-65%). ?- Suboptimal study overall due to GI uptake as well as bundle branch block with decreased specificity of the above findings. Consider alternative testing. ?- CT attenuation images with no significant aortic or coronary calcifications. ? ?Echo 07/02/21:  ?Normal LV size and wall thickness.  LVEF 35-40% with global hypokinesis.  Normal RV size and function.  Mild MR.  No significant change from prior echo on 06/22/2021. ?  ?Neuro/Psych ?negative neurological ROS ?   ? GI/Hepatic ?GERD  ,  ?Endo/Other  ?negative endocrine ROS ? Renal/GU ?negative Renal ROS  ? ?  ?Musculoskeletal ? ? Abdominal ?  ?Peds ? Hematology ?negative hematology ROS ?(+)   ?Anesthesia Other Findings ?Reviewed and agree with Bayard Males pre-anesthesia clinical review note. ? ?Cardiology note 04/15/22:  ?1.  Preoperative Cardiovascular Risk Assessment: ?The patient affirms she has been doing well without any new cardiac symptoms. They are able to achieve 5 METS without cardiac limitations. Therefore,  based on ACC/AHA guidelines, the patient would be at acceptable risk for the planned procedure without further cardiovascular testing. The patient was advised that if she develops new symptoms prior to surgery to contact our office to arrange for a follow-up visit, and she verbalized understanding.  ?? ?RCRI: 6.6% ? ? ?Cardiology note 02/07/22:  ?Chronic HFrEF due to nonischemic cardiomyopathy: ?Ms. Menning reports significant fatigue that is likely multifactorial.  This started shortly after our visit earlier this month and coincided with possible allergic reaction.  We will repeat an echocardiogram today to reassess her LVEF and exclude enlarging pericardial effusion given report of small pericardial effusion on CTA of the chest earlier this month.  Due to her borderline low blood pressure, I am reluctant to increase metoprolol or add other GDMT.  We may need to consider referral to the advanced heart failure clinic and or EP for further evaluation including consideration of CRT if her EF does not improve. ? ?Chest pain and tachycardia: ?Chest pain is atypical and seems most consistent with a noncardiac etiology such as GERD.  Catheterization last year showed catheter induced vasospasm but otherwise no significant CAD.  Ms. Tijerino has not experienced any improvement with omeprazole.  I wonder if she could have an element of pericarditis given her small pericardial effusion on recent CTA.  Her EKG does not allow for assessment of ST changes that could be seen with pericarditis due to chronic LBBB.  We will obtain an echocardiogram today.  I will also check a CBC, CMP, magnesium level, TSH, ESR, CRP, and ANA.  We will begin an empiric course of colchicine 0.6 mg twice daily, though this may need to  be reduced to daily dosing if Ms. Falkenstein develops GI symptoms. ? ?Coronary vasospasm: ?Chest pain not consistent with angina.  Defer pharmacotherapy at this time. ? ?COPD: ?Wheezing noted on examination today.  Question if  COPD is contributing to her constellation of symptoms.  She was treated for exacerbation and possible pneumonia earlier this month.  Continue inhaled therapies per pulmonary. ?? ?Right radial artery occlusion: ?Pulse remains absent.  No further work-up, given lack of symptoms. ? ?Follow-up: Return to clinic in 2-3 weeks. ? Reproductive/Obstetrics ? ?  ? ? ? ? ? ? ? ? ? ? ? ? ? ?  ?  ? ? ? ? ? ? ? ?Anesthesia Physical ?Anesthesia Plan ? ?ASA: 3 ? ?Anesthesia Plan: General  ? ?Post-op Pain Management:   ? ?Induction: Intravenous ? ?PONV Risk Score and Plan: 3 and Ondansetron, Dexamethasone and Treatment may vary due to age or medical condition ? ?Airway Management Planned: LMA ? ?Additional Equipment:  ? ?Intra-op Plan:  ? ?Post-operative Plan: Extubation in OR ? ?Informed Consent: I have reviewed the patients History and Physical, chart, labs and discussed the procedure including the risks, benefits and alternatives for the proposed anesthesia with the patient or authorized representative who has indicated his/her understanding and acceptance.  ? ? ? ?Dental advisory given ? ?Plan Discussed with: CRNA ? ?Anesthesia Plan Comments: (Patient consented for risks of anesthesia including but not limited to:  ?- adverse reactions to medications ?- damage to eyes, teeth, lips or other oral mucosa ?- nerve damage due to positioning  ?- sore throat or hoarseness ?- damage to heart, brain, nerves, lungs, other parts of body or loss of life ? ?Informed patient about role of CRNA in peri- and intra-operative care.  Patient voiced understanding.)  ? ? ? ? ? ?Anesthesia Quick Evaluation ? ?

## 2022-04-17 NOTE — Discharge Instructions (Signed)

## 2022-04-17 NOTE — Transfer of Care (Signed)
Immediate Anesthesia Transfer of Care Note ? ?Patient: Caroline Conley ? ?Procedure(s) Performed: HERNIA REPAIR INGUINAL ADULT (Right) ? ?Patient Location: PACU ? ?Anesthesia Type:General ? ?Level of Consciousness: drowsy ? ?Airway & Oxygen Therapy: Patient Spontanous Breathing and Patient connected to face mask oxygen ? ?Post-op Assessment: Report given to RN and Post -op Vital signs reviewed and stable ? ?Post vital signs: Reviewed and stable ? ?Last Vitals:  ?Vitals Value Taken Time  ?BP 101/69   ?Temp 98.68f  ?Pulse 100 04/17/22 1256  ?Resp 11 04/17/22 1256  ?SpO2 100 % 04/17/22 1256  ?Vitals shown include unvalidated device data. ? ?Last Pain:  ?Vitals:  ? 04/17/22 1036  ?TempSrc: Temporal  ?PainSc:   ?   ? ?Patients Stated Pain Goal: 0 (04/17/22 1023) ? ?Complications: No notable events documented. ?

## 2022-04-18 ENCOUNTER — Encounter: Payer: Self-pay | Admitting: General Surgery

## 2022-04-24 ENCOUNTER — Ambulatory Visit: Payer: 59 | Admitting: Internal Medicine

## 2022-04-24 NOTE — Progress Notes (Deleted)
Follow-up Outpatient Visit Date: 04/24/2022  Primary Care Provider: Olin Hauser, DO [place] [id number]  Chief Complaint: ***  HPI:  Caroline Conley is a 56 y.o. female with history of nonischemic cardiomyopathy and LBBB, palpitations, atypical chest pain with vasospasm on catheterization complicated by iatrogenic ventricular fibrillation with injection of RCA (06/2021) and radial artery occlusion, prior tobacco use, and basal cell carcinoma, who presents for follow-up of cardiomyopathy.  I last saw her in late February, at which time she continued to complain of daily chest pain described as heartburn.  She also endorsed more frequent palpitations and lightheadedness in the setting of poor appetite and decreased fluid intake.  Repeat echocardiogram showed LVEF of 35-40% with grade 2 diastolic dysfunction.  Labs to evaluate for pericarditis were notable for elevated CRP and upper normal ESR.  We had added colchicine at our visit the day before, which I encouraged Caroline Conley to continue taking.  She had a virtual preop visit with Ambrose Pancoast, NP, last week in anticipation of hernia repair.  She denied any chest pain or shortness of breath at that time.  --------------------------------------------------------------------------------------------------  Cardiovascular History & Procedures: Cardiovascular Problems: Tachycardia and palpitations Atypical chest pain Iatrogenic ventricular fibrillation during catheterization Right radial artery occlusion (following diagnostic catheterization in 06/2021)   Risk Factors: Tobacco use   Cath/PCI: LHC (07/02/2021): Coronary vasospasm with 30 to 40% stenosis of the proximal RCA, which resolved with intracoronary NTG.  Otherwise, no angiographically significant CAD.  Mildly elevated left ventricular filling pressure (LVEDP 15-20 mmHg).  Ventricular fibrillation precipitated with intracoronary NTG administration into the RCA, successfully  treated with defibrillation x1. LHC (2015): Report not available.  Images personally reviewed, demonstrating no significant CAD and normal LVEF.   CV Surgery: None   EP Procedures and Devices: 14-day event monitor (03/04/2019): Limited study due to significant artifact.  Usable tracings show predominantly sinus rhythm with rare PAC's and PVC's.  Two brief atrial runs noted (up to 5 beats). 48-hour Holter monitor (08/06/2018): Predominantly sinus rhythm with rare PAC's and PVC's, as well as brief atrial runs.  Average rate during the monitoring period was elevated (106 bpm).   Non-Invasive Evaluation(s): TTE (02/07/2022): LVEF 35-40% with grade 2 diastolic dysfunction.  Akinesis of the basal/mid anteroseptal wall was noted.  Normal RV size and function.  Normal biatrial size.  No pericardial effusion.  Mild mitral regurgitation noted.  Normal CVP. Limited TTE (07/02/2021): Normal LV size and wall thickness.  LVEF 35-40% with global hypokinesis.  Normal RV size and function.  Mild MR.  No significant change from prior echo on 06/22/2021. Pharmacologic MPI (06/25/2021): Moderate size, mild in severity, mid anterior and apical reversible defect consistent with possible LAD territory ischemia.  LVEF 55-65%.  Intermediate risk study. TTE (06/22/2021): Normal LV size and wall thickness.  LVEF 40% with grade 1 diastolic dysfunction and global longitudinal strain of -13.7%.  Global hypokinesis noted.  Normal RV size and function.  Normal biatrial size.  Mild to moderate mitral regurgitation. TTE (08/04/2018): Normal LV size and wall thickness.  LVEF 60-65% with normal wall motion.  Grade 1 diastolic dysfunction.  Normal RV size and function.  No significant valvular abnormality.  Recent CV Pertinent Labs: Lab Results  Component Value Date   CHOL 169 07/29/2017   HDL 35 (L) 07/29/2017   LDLCALC 85 07/29/2017   TRIG 243 (H) 07/29/2017   CHOLHDL 4.8 07/29/2017   INR 1.0 01/21/2022   K 4.5 02/07/2022   K  3.6  09/23/2014   MG 2.1 02/07/2022   BUN 7 02/07/2022   BUN 5 (L) 09/23/2014   CREATININE 1.08 (H) 02/07/2022   CREATININE 1.16 (H) 12/31/2018    Past medical and surgical history were reviewed and updated in EPIC.  No outpatient medications have been marked as taking for the 04/24/22 encounter (Appointment) with Chiquetta Langner, Harrell Gave, MD.    Allergies: 2,4-d dimethylamine; Vicodin [hydrocodone-acetaminophen]; and Morphine and related  Social History   Tobacco Use   Smoking status: Former    Packs/day: 1.00    Years: 30.00    Pack years: 30.00    Types: Cigarettes    Quit date: 03/02/2018    Years since quitting: 4.1   Smokeless tobacco: Never   Tobacco comments:    0.5 to 1 ppd >30 years  Vaping Use   Vaping Use: Never used  Substance Use Topics   Alcohol use: No   Drug use: Yes    Types: Oxycodone    Family History  Problem Relation Age of Onset   Diabetes Mother    High blood pressure Mother    Gout Mother    Cancer Father    Stroke Father    Arthritis/Rheumatoid Father    Diabetes Father    High blood pressure Father    Heart failure Father    Heart failure Brother    Diabetes Brother     Review of Systems: A 12-system review of systems was performed and was negative except as noted in the HPI.  --------------------------------------------------------------------------------------------------  Physical Exam: There were no vitals taken for this visit.  General:  NAD. Neck: No JVD or HJR. Lungs: Clear to auscultation bilaterally without wheezes or crackles. Heart: Regular rate and rhythm without murmurs, rubs, or gallops. Abdomen: Soft, nontender, nondistended. Extremities: No lower extremity edema.  EKG:  ***  Lab Results  Component Value Date   WBC 8.6 02/07/2022   HGB 12.8 02/07/2022   HCT 36.6 02/07/2022   MCV 98 (H) 02/07/2022   PLT 294 02/07/2022    Lab Results  Component Value Date   NA 136 02/07/2022   K 4.5 02/07/2022   CL 99 02/07/2022    CO2 25 02/07/2022   BUN 7 02/07/2022   CREATININE 1.08 (H) 02/07/2022   GLUCOSE 109 (H) 02/07/2022   ALT 25 02/07/2022    Lab Results  Component Value Date   CHOL 169 07/29/2017   HDL 35 (L) 07/29/2017   LDLCALC 85 07/29/2017   TRIG 243 (H) 07/29/2017   CHOLHDL 4.8 07/29/2017    --------------------------------------------------------------------------------------------------  ASSESSMENT AND PLAN: Harrell Gave Traxton Kolenda, MD 04/24/2022 2:09 PM

## 2022-04-25 ENCOUNTER — Encounter: Payer: Self-pay | Admitting: Internal Medicine

## 2022-05-09 ENCOUNTER — Ambulatory Visit: Payer: Medicaid Other | Admitting: Dermatology

## 2022-06-11 ENCOUNTER — Encounter: Payer: Self-pay | Admitting: Dermatology

## 2022-06-11 ENCOUNTER — Ambulatory Visit (INDEPENDENT_AMBULATORY_CARE_PROVIDER_SITE_OTHER): Payer: 59 | Admitting: Dermatology

## 2022-06-11 DIAGNOSIS — Z85820 Personal history of malignant melanoma of skin: Secondary | ICD-10-CM | POA: Diagnosis not present

## 2022-06-11 DIAGNOSIS — Z86018 Personal history of other benign neoplasm: Secondary | ICD-10-CM

## 2022-06-11 DIAGNOSIS — L814 Other melanin hyperpigmentation: Secondary | ICD-10-CM | POA: Diagnosis not present

## 2022-06-11 DIAGNOSIS — D225 Melanocytic nevi of trunk: Secondary | ICD-10-CM | POA: Diagnosis not present

## 2022-06-11 DIAGNOSIS — I781 Nevus, non-neoplastic: Secondary | ICD-10-CM | POA: Diagnosis not present

## 2022-06-11 DIAGNOSIS — D2262 Melanocytic nevi of left upper limb, including shoulder: Secondary | ICD-10-CM

## 2022-06-11 DIAGNOSIS — L578 Other skin changes due to chronic exposure to nonionizing radiation: Secondary | ICD-10-CM

## 2022-06-11 DIAGNOSIS — Z1283 Encounter for screening for malignant neoplasm of skin: Secondary | ICD-10-CM

## 2022-06-11 DIAGNOSIS — D2272 Melanocytic nevi of left lower limb, including hip: Secondary | ICD-10-CM | POA: Diagnosis not present

## 2022-06-11 DIAGNOSIS — L570 Actinic keratosis: Secondary | ICD-10-CM | POA: Diagnosis not present

## 2022-06-11 DIAGNOSIS — D2372 Other benign neoplasm of skin of left lower limb, including hip: Secondary | ICD-10-CM

## 2022-06-11 DIAGNOSIS — D692 Other nonthrombocytopenic purpura: Secondary | ICD-10-CM | POA: Diagnosis not present

## 2022-06-11 DIAGNOSIS — D239 Other benign neoplasm of skin, unspecified: Secondary | ICD-10-CM

## 2022-06-11 DIAGNOSIS — L821 Other seborrheic keratosis: Secondary | ICD-10-CM | POA: Diagnosis not present

## 2022-06-11 DIAGNOSIS — D229 Melanocytic nevi, unspecified: Secondary | ICD-10-CM

## 2022-06-25 ENCOUNTER — Telehealth: Payer: Self-pay | Admitting: Internal Medicine

## 2022-06-25 NOTE — Telephone Encounter (Signed)
Patient last seen 05/2021. She is aware that an appointment is needed.  She stated that she is scheduled to see PCP 06/27/2022 to address current sx. C/o prod cough with yellow sputum, increased SOB and wheezing x3d.  Using albuterol HFA TID, trelegy once daily and Daliresp once daily. No availability in [place] or [place] for acute visit today.  Recommended UC if sx worsen before PCP appt. She voiced her understanding. Appt scheduled 09/26/2022 at 10:00 with Dr. Mortimer Fries. Nothing further needed.

## 2022-06-27 ENCOUNTER — Encounter: Payer: Self-pay | Admitting: Family Medicine

## 2022-06-27 ENCOUNTER — Ambulatory Visit: Payer: Medicaid Other | Admitting: Dermatology

## 2022-06-27 ENCOUNTER — Ambulatory Visit (INDEPENDENT_AMBULATORY_CARE_PROVIDER_SITE_OTHER): Payer: 59 | Admitting: Family Medicine

## 2022-06-27 VITALS — BP 152/79 | HR 91 | Ht 66.0 in | Wt 126.6 lb

## 2022-06-27 DIAGNOSIS — R112 Nausea with vomiting, unspecified: Secondary | ICD-10-CM

## 2022-06-27 DIAGNOSIS — B370 Candidal stomatitis: Secondary | ICD-10-CM

## 2022-06-27 DIAGNOSIS — K219 Gastro-esophageal reflux disease without esophagitis: Secondary | ICD-10-CM | POA: Diagnosis not present

## 2022-06-27 DIAGNOSIS — J441 Chronic obstructive pulmonary disease with (acute) exacerbation: Secondary | ICD-10-CM

## 2022-06-27 DIAGNOSIS — T23111A Burn of first degree of right thumb (nail), initial encounter: Secondary | ICD-10-CM

## 2022-06-27 MED ORDER — OMEPRAZOLE 40 MG PO CPDR: 40.0000 mg | DELAYED_RELEASE_CAPSULE | Freq: Every day | ORAL | 3 refills | Status: DC

## 2022-06-27 MED ORDER — HYDROCOD POLI-CHLORPHE POLI ER 10-8 MG/5ML PO SUER: 5.0000 mL | Freq: Two times a day (BID) | ORAL | 0 refills | Status: DC | PRN

## 2022-06-27 MED ORDER — SILVER SULFADIAZINE 1 % EX CREA: 1.0000 | TOPICAL_CREAM | Freq: Every day | CUTANEOUS | 0 refills | Status: DC

## 2022-06-27 MED ORDER — PREDNISONE 20 MG PO TABS: ORAL_TABLET | ORAL | 0 refills | Status: DC

## 2022-06-27 MED ORDER — FLUCONAZOLE 150 MG PO TABS: ORAL_TABLET | ORAL | 3 refills | Status: DC

## 2022-06-27 MED ORDER — PROMETHAZINE HCL 25 MG PO TABS: 12.5000 mg | ORAL_TABLET | Freq: Three times a day (TID) | ORAL | 2 refills | Status: DC | PRN

## 2022-06-27 MED ORDER — LEVOFLOXACIN 500 MG PO TABS: 500.0000 mg | ORAL_TABLET | Freq: Every day | ORAL | 0 refills | Status: DC

## 2022-06-27 NOTE — Patient Instructions (Addendum)
   Please schedule a Follow-up Appointment to: Return if symptoms worsen or fail to improve.  If you have any other questions or concerns, please feel free to call the office or send a message through MyChart. You may also schedule an earlier appointment if necessary.  Additionally, you may be receiving a survey about your experience at our office within a few days to 1 week by e-mail or mail. We value your feedback.  Charlane Westry, DO [facility], [facility] 
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
## 2022-07-17 ENCOUNTER — Other Ambulatory Visit: Payer: Self-pay | Admitting: Family Medicine

## 2022-07-17 DIAGNOSIS — R112 Nausea with vomiting, unspecified: Secondary | ICD-10-CM

## 2022-07-17 NOTE — Telephone Encounter (Signed)
Requested medication (s) are due for refill today - no  Requested medication (s) are on the active medication list -yes  Future visit scheduled -no  Last refill: 06/27/22 #20 2RF  Notes to clinic: non delegated Rx  Requested Prescriptions  Pending Prescriptions Disp Refills   promethazine (PHENERGAN) 25 MG tablet [Pharmacy Med Name: PROMETHAZINE HCL 25 MG TAB] 20 tablet 2    Sig: TAKE 1/2-1 TABLET BY MOUTH EVERY [place] NEEDED FOR NAUSEA OR VOMITING     Not Delegated - Gastroenterology: Antiemetics Failed - 07/17/2022 12:48 PM      Failed - This refill cannot be delegated      Passed - Valid encounter within last 6 months    Recent Outpatient Visits           2 weeks ago COPD with acute exacerbation ([place])   [place], DO   4 months ago Right inguinal hernia   [place], DO   4 months ago COPD with acute exacerbation [facility])   [place], DO   5 months ago Chronic bursitis of right shoulder   [place], Devonne Doughty, DO   5 months ago Community acquired pneumonia of right lower lobe of lung   [place], DO       Future Appointments             In 2 months Flora Lipps, MD [place]   In 5 months Brendolyn Patty, MD [place]               Requested Prescriptions  Pending Prescriptions Disp Refills   promethazine (PHENERGAN) 25 MG tablet [Pharmacy Med Name: PROMETHAZINE HCL 25 MG TAB] 20 tablet 2    Sig: TAKE 1/2-1 TABLET BY MOUTH EVERY [place]     Not Delegated - Gastroenterology: Antiemetics Failed - 07/17/2022 12:48 PM      Failed - This refill cannot be delegated      Passed - Valid encounter within last 6 months    Recent Outpatient Visits           2 weeks ago COPD with acute exacerbation [facility])    [place], DO   4 months ago Right inguinal hernia   [place], DO   4 months ago COPD with acute exacerbation [facility])   [place], DO   5 months ago Chronic bursitis of right shoulder   [place], DO   5 months ago Community acquired pneumonia of right lower lobe of lung   [place], DO       Future Appointments             In 2 months Flora Lipps, MD [place]   In 5 months Brendolyn Patty, MD [place]

## 2022-08-15 ENCOUNTER — Encounter: Payer: Self-pay | Admitting: Dermatology

## 2022-08-15 ENCOUNTER — Ambulatory Visit (INDEPENDENT_AMBULATORY_CARE_PROVIDER_SITE_OTHER): Payer: 59 | Admitting: Dermatology

## 2022-08-15 DIAGNOSIS — L570 Actinic keratosis: Secondary | ICD-10-CM | POA: Diagnosis not present

## 2022-08-15 DIAGNOSIS — D2372 Other benign neoplasm of skin of left lower limb, including hip: Secondary | ICD-10-CM | POA: Diagnosis not present

## 2022-08-15 DIAGNOSIS — D239 Other benign neoplasm of skin, unspecified: Secondary | ICD-10-CM

## 2022-08-15 NOTE — Patient Instructions (Addendum)
Cryotherapy Aftercare  Wash gently with soap and water everyday.   Apply Vaseline daily until healed.    Due to recent changes in healthcare laws, you may see results of your pathology and/or laboratory studies on MyChart before the doctors have had a chance to review them. We understand that in some cases there may be results that are confusing or concerning to you. Please understand that not all results are received at the same time and often the doctors may need to interpret multiple results in order to provide you with the best plan of care or course of treatment. Therefore, we ask that you please give us 2 business days to thoroughly review all your results before contacting the office for clarification. Should we see a critical lab result, you will be contacted sooner.   If You Need Anything After Your Visit  If you have any questions or concerns for your doctor, please call our main line at [phone] and press option 4 to reach your doctor's medical assistant. If no one answers, please leave a voicemail as directed and we will return your call as soon as possible. Messages left after 4 pm will be answered the following business day.   You may also send us a message via MyChart. We typically respond to MyChart messages within 1-2 business days.  For prescription refills, please ask your pharmacy to contact our office. Our fax number is [phone].  If you have an urgent issue when the clinic is closed that cannot wait until the next business day, you can page your doctor at the number below.    Please note that while we do our best to be available for urgent issues outside of office hours, we are not available 24/7.   If you have an urgent issue and are unable to reach us, you may choose to seek medical care at your doctor's office, retail clinic, urgent care center, or emergency room.  If you have a medical emergency, please immediately call 911 or go to the emergency  department.  Pager Numbers  - Dr. Kowalski: [phone]  - Dr. Moye: [phone]  - Dr. Stewart: [phone]  In the event of inclement weather, please call our main line at [phone] for an update on the status of any delays or closures.  Dermatology Medication Tips: Please keep the boxes that topical medications come in in order to help keep track of the instructions about where and how to use these. Pharmacies typically print the medication instructions only on the boxes and not directly on the medication tubes.   If your medication is too expensive, please contact our office at [phone] option 4 or send us a message through MyChart.   We are unable to tell what your co-pay for medications will be in advance as this is different depending on your insurance coverage. However, we may be able to find a substitute medication at lower cost or fill out paperwork to get insurance to cover a needed medication.   If a prior authorization is required to get your medication covered by your insurance company, please allow us 1-2 business days to complete this process.  Drug prices often vary depending on where the prescription is filled and some pharmacies may offer cheaper prices.  The website www.goodrx.com contains coupons for medications through different pharmacies. The prices here do not account for what the cost may be with help from insurance (it may be cheaper with your insurance), but the website can give you the   price if you did not use any insurance.  - You can print the associated coupon and take it with your prescription to the pharmacy.  - You may also stop by our office during regular business hours and pick up a GoodRx coupon card.  - If you need your prescription sent electronically to a different pharmacy, notify our office through [place] MyChart or by phone at [phone] option 4.     Si Usted Necesita Algo Despus de Su Visita  Tambin puede enviarnos un  mensaje a travs de MyChart. Por lo general respondemos a los mensajes de MyChart en el transcurso de 1 a 2 das hbiles.  Para renovar recetas, por favor pida a su farmacia que se ponga en contacto con nuestra oficina. Nuestro nmero de fax es el [phone].  Si tiene un asunto urgente cuando la clnica est cerrada y que no puede esperar hasta el siguiente da hbil, puede llamar/localizar a su doctor(a) al nmero que aparece a continuacin.   Por favor, tenga en cuenta que aunque hacemos todo lo posible para estar disponibles para asuntos urgentes fuera del horario de oficina, no estamos disponibles las 24 horas del da, los 7 das de la semana.   Si tiene un problema urgente y no puede comunicarse con nosotros, puede optar por buscar atencin mdica  en el consultorio de su doctor(a), en una clnica privada, en un centro de atencin urgente o en una sala de emergencias.  Si tiene una emergencia mdica, por favor llame inmediatamente al 911 o vaya a la sala de emergencias.  Nmeros de bper  - Dr. Kowalski: [phone]  - Dra. Moye: [phone]  - Dra. Stewart: [phone]  En caso de inclemencias del tiempo, por favor llame a nuestra lnea principal al [phone] para una actualizacin sobre el estado de cualquier retraso o cierre.  Consejos para la medicacin en dermatologa: Por favor, guarde las cajas en las que vienen los medicamentos de uso tpico para ayudarle a seguir las instrucciones sobre dnde y cmo usarlos. Las farmacias generalmente imprimen las instrucciones del medicamento slo en las cajas y no directamente en los tubos del medicamento.   Si su medicamento es muy caro, por favor, pngase en contacto con nuestra oficina llamando al [phone] y presione la opcin 4 o envenos un mensaje a travs de MyChart.   No podemos decirle cul ser su copago por los medicamentos por adelantado ya que esto es diferente dependiendo de la cobertura de su seguro. Sin embargo,  es posible que podamos encontrar un medicamento sustituto a menor costo o llenar un formulario para que el seguro cubra el medicamento que se considera necesario.   Si se requiere una autorizacin previa para que su compaa de seguros cubra su medicamento, por favor permtanos de 1 a 2 das hbiles para completar este proceso.  Los precios de los medicamentos varan con frecuencia dependiendo del lugar de dnde se surte la receta y alguna farmacias pueden ofrecer precios ms baratos.  El sitio web www.goodrx.com tiene cupones para medicamentos de diferentes farmacias. Los precios aqu no tienen en cuenta lo que podra costar con la ayuda del seguro (puede ser ms barato con su seguro), pero el sitio web puede darle el precio si no utiliz ningn seguro.  - Puede imprimir el cupn correspondiente y llevarlo con su receta a la farmacia.  - Tambin puede pasar por nuestra oficina durante el horario de atencin regular y recoger una tarjeta de cupones de GoodRx.  - Si necesita que   su receta se enve electrnicamente a una farmacia diferente, informe a nuestra oficina a travs de MyChart de [place] o por telfono llamando al [phone] y presione la opcin 4.  

## 2022-08-15 NOTE — Progress Notes (Signed)
   Follow-Up Visit   Subjective  Caroline Conley is a 56 y.o. female who presents for the following: lesion (Left hip. Dur: several weeks. Itches at times).  The patient has spots, moles and lesions to be evaluated, some may be new or changing and the patient has concerns that these could be cancer.  The following portions of the chart were reviewed this encounter and updated as appropriate:  Tobacco  Allergies  Meds  Problems  Med Hx  Surg Hx  Fam Hx      Review of Systems: No other skin or systemic complaints except as noted in HPI or Assessment and Plan.   Objective  Well appearing patient in no apparent distress; mood and affect are within normal limits.  A focused examination was performed including left hip, face. Relevant physical exam findings are noted in the Assessment and Plan.  Left Hip Firm pink/brown papulenodule with dimple sign.   Left Nose x1 Erythematous thin papules/macules with gritty scale.    Assessment & Plan    Dermatofibroma Left Hip  A dermatofibroma is a benign growth possibly related to trauma, such as an insect bite or inflamed acne-type bump.  Discussed removal (shave vrs excision) with resulting scar and risk of recurrence.  Since not bothersome, will observe for now.  Symptomatic. Patient deferred treatment at this time.   AK (actinic keratosis) Left Nose x1  Actinic keratoses are precancerous spots that appear secondary to cumulative UV radiation exposure/sun exposure over time. They are chronic with expected duration over 1 year. A portion of actinic keratoses will progress to squamous cell carcinoma of the skin. It is not possible to reliably predict which spots will progress to skin cancer and so treatment is recommended to prevent development of skin cancer.  Recommend daily broad spectrum sunscreen SPF 30+ to sun-exposed areas, reapply every 2 hours as needed.  Recommend staying in the shade or wearing long sleeves, sun glasses  (UVA+UVB protection) and wide brim hats (4-inch brim around the entire circumference of the hat). Call for new or changing lesions.  Destruction of lesion - Left Nose x1  Destruction method: cryotherapy   Informed consent: discussed and consent obtained   Lesion destroyed using liquid nitrogen: Yes   Outcome: patient tolerated procedure well with no complications   Post-procedure details: wound care instructions given   Additional details:  Prior to procedure, discussed risks of blister formation, small wound, skin dyspigmentation, or rare scar following cryotherapy. Recommend Vaseline ointment to treated areas while healing.    Return for Follow Up As Scheduled to recheck moles.  I, Emelia Salisbury, CMA, am acting as scribe for Forest Gleason, MD.  Documentation: I have reviewed the above documentation for accuracy and completeness, and I agree with the above.  Forest Gleason, MD

## 2022-08-20 ENCOUNTER — Encounter: Payer: Self-pay | Admitting: Family Medicine

## 2022-08-20 ENCOUNTER — Ambulatory Visit (INDEPENDENT_AMBULATORY_CARE_PROVIDER_SITE_OTHER): Payer: 59 | Admitting: Family Medicine

## 2022-08-20 ENCOUNTER — Other Ambulatory Visit: Payer: Self-pay | Admitting: Family Medicine

## 2022-08-20 VITALS — BP 131/72 | HR 80 | Ht 66.0 in | Wt 127.2 lb

## 2022-08-20 DIAGNOSIS — J431 Panlobular emphysema: Secondary | ICD-10-CM | POA: Diagnosis not present

## 2022-08-20 DIAGNOSIS — J441 Chronic obstructive pulmonary disease with (acute) exacerbation: Secondary | ICD-10-CM | POA: Diagnosis not present

## 2022-08-20 DIAGNOSIS — B370 Candidal stomatitis: Secondary | ICD-10-CM

## 2022-08-20 DIAGNOSIS — R112 Nausea with vomiting, unspecified: Secondary | ICD-10-CM

## 2022-08-20 MED ORDER — HYDROCOD POLI-CHLORPHE POLI ER 10-8 MG/5ML PO SUER: 5.0000 mL | Freq: Two times a day (BID) | ORAL | 0 refills | Status: DC | PRN

## 2022-08-20 MED ORDER — TRELEGY ELLIPTA 100-62.5-25 MCG/ACT IN AEPB: 1.0000 | INHALATION_SPRAY | Freq: Every day | RESPIRATORY_TRACT | 11 refills | Status: DC

## 2022-08-20 MED ORDER — MAGIC MOUTHWASH W/LIDOCAINE: ORAL | 0 refills | Status: DC

## 2022-08-20 MED ORDER — PREDNISONE 20 MG PO TABS: ORAL_TABLET | ORAL | 0 refills | Status: DC

## 2022-08-20 MED ORDER — ALBUTEROL SULFATE HFA 108 (90 BASE) MCG/ACT IN AERS: INHALATION_SPRAY | RESPIRATORY_TRACT | 5 refills | Status: DC

## 2022-08-20 MED ORDER — LEVOFLOXACIN 500 MG PO TABS: 500.0000 mg | ORAL_TABLET | Freq: Every day | ORAL | 0 refills | Status: DC

## 2022-08-20 NOTE — Patient Instructions (Addendum)
Thank you for coming to the office today.  Today re ordered inhalers, trelegy up to 1 year  Start Levaquin course. Start Prednisone taper  You should have Diflucan at home.  Cough Syrup ordered today  Please schedule a Follow-up Appointment to: No follow-ups on file.  If you have any other questions or concerns, please feel free to call the office or send a message through [place]. You may also schedule an earlier appointment if necessary.  Additionally, you may be receiving a survey about your experience at our office within a few days to 1 week by e-mail or mail. We value your feedback.  [name], DO [place]

## 2022-08-20 NOTE — Progress Notes (Unsigned)
Subjective:    Patient ID: Caroline Conley, female    DOB: 10/14/1966, 56 y.o.   MRN: [id number]  CHRISTEAN SILVESTRI is a 56 y.o. female presenting on 08/20/2022 for COPD   HPI  ACUTE COPD FLARE Last visit for COPD flare 06/27/22 treated with levaquin, she did improve and resolved flare.   Now new onset 6-7 days with early COPD flare symptoms again. With thicker yellow sputum increased worsening cough and dyspnea   Admits cough, shortness of breath  Using nebulizer   Denies any known or suspected exposure to person with or possibly with COVID19.   Denies any fevers, chills, sweats, body ache, sinus pain or pressure, headache, abdominal pain, diarrhea   Unable to get in to [facility] She has scheduled for October 2023  She has updated her Flu shot and Pneumonia vaccine prevnar-20 07/26/22      06/27/2022    1:10 PM 03/14/2022   11:22 AM 01/24/2022    9:43 AM  Depression screen PHQ 2/9  Decreased Interest 0 0 0  Down, Depressed, Hopeless 0 0 0  PHQ - 2 Score 0 0 0  Altered sleeping 0 0 0  Tired, decreased energy 0 0 0  Change in appetite 0 0 0  Feeling bad or failure about yourself  0 0 0  Trouble concentrating 0 0 0  Moving slowly or fidgety/restless 0 0 0  Suicidal thoughts 0 0 0  PHQ-9 Score 0 0 0  Difficult doing work/chores Not difficult at all Not difficult at all Not difficult at all    Social History   Tobacco Use   Smoking status: Former    Packs/day: 1.00    Years: 30.00    Total pack years: 30.00    Types: Cigarettes    Quit date: 03/02/2018    Years since quitting: 4.4   Smokeless tobacco: Never   Tobacco comments:    0.5 to 1 ppd >30 years  Vaping Use   Vaping Use: Never used  Substance Use Topics   Alcohol use: No   Drug use: Yes    Types: Oxycodone    Review of Systems Per HPI unless specifically indicated above     Objective:    BP 131/72   Pulse 80   Ht '5\' 6"'$  (1.676 m)   Wt 127 lb 3.2 oz (57.7 kg)   SpO2 99%   BMI  20.53 kg/m   Wt Readings from Last 3 Encounters:  08/20/22 127 lb 3.2 oz (57.7 kg)  06/27/22 126 lb 9.6 oz (57.4 kg)  04/17/22 132 lb 4.4 oz (60 kg)    Physical Exam Vitals and nursing note reviewed.  Constitutional:      General: She is not in acute distress.    Appearance: She is well-developed. She is not diaphoretic.     Comments: Well-appearing, comfortable, cooperative  HENT:     Head: Normocephalic and atraumatic.  Eyes:     General:        Right eye: No discharge.        Left eye: No discharge.     Conjunctiva/sclera: Conjunctivae normal.  Neck:     Thyroid: No thyromegaly.  Cardiovascular:     Rate and Rhythm: Normal rate and regular rhythm.     Heart sounds: Normal heart sounds. No murmur heard. Pulmonary:     Effort: Pulmonary effort is normal. No respiratory distress.     Breath sounds: Wheezing present. No rales.  Musculoskeletal:  General: Normal range of motion.     Cervical back: Normal range of motion and neck supple.  Lymphadenopathy:     Cervical: No cervical adenopathy.  Skin:    General: Skin is warm and dry.     Findings: No erythema or rash.  Neurological:     Mental Status: She is alert and oriented to person, place, and time.  Psychiatric:        Behavior: Behavior normal.     Comments: Well groomed, good eye contact, normal speech and thoughts       Results for orders placed or performed in visit on 02/07/22  ECHOCARDIOGRAM COMPLETE  Result Value Ref Range   Weight 2,096 oz   Height 66.5 in   BP 98/70 mmHg   AR max vel 2.43 cm2   AV Peak grad 6.6 mmHg   Ao pk vel 1.28 m/s   S' Lateral 3.40 cm   Area-P 1/2 6.37 cm2   AV Area VTI 2.30 cm2   AV Mean grad 4.0 mmHg   Single Plane A4C EF 38.9 %   Single Plane A2C EF 37.5 %   Calc EF 38.9 %   AV Area mean vel 2.34 cm2      Assessment & Plan:   Problem List Items Addressed This Visit     Panlobular emphysema (HCC)   Relevant Medications   predniSONE (DELTASONE) 20 MG  tablet   chlorpheniramine-HYDROcodone (TUSSIONEX) 10-8 MG/5ML   albuterol (VENTOLIN HFA) 108 (90 Base) MCG/ACT inhaler   TRELEGY ELLIPTA 100-62.5-25 MCG/ACT AEPB   magic mouthwash w/lidocaine SOLN   Other Visit Diagnoses     COPD with acute exacerbation (HCC)    -  Primary   Relevant Medications   levofloxacin (LEVAQUIN) 500 MG tablet   predniSONE (DELTASONE) 20 MG tablet   chlorpheniramine-HYDROcodone (TUSSIONEX) 10-8 MG/5ML   albuterol (VENTOLIN HFA) 108 (90 Base) MCG/ACT inhaler   TRELEGY ELLIPTA 100-62.5-25 MCG/ACT AEPB   magic mouthwash w/lidocaine SOLN   Oral yeast infection       Relevant Medications   magic mouthwash w/lidocaine SOLN       Meds ordered this encounter  Medications   levofloxacin (LEVAQUIN) 500 MG tablet    Sig: Take 1 tablet (500 mg total) by mouth daily. For 7 days    Dispense:  7 tablet    Refill:  0   predniSONE (DELTASONE) 20 MG tablet    Sig: Take daily with food. Start with '40mg'$  (2 pills) x 4 days, then reduce to '20mg'$  (1 pills) x 4 days, then '10mg'$  (half pill) x 2 days    Dispense:  13 tablet    Refill:  0   chlorpheniramine-HYDROcodone (TUSSIONEX) 10-8 MG/5ML    Sig: Take 5 mLs by mouth every 12 (twelve) hours as needed for cough.    Dispense:  140 mL    Refill:  0   albuterol (VENTOLIN HFA) 108 (90 Base) MCG/ACT inhaler    Sig: INHALE 2 PUFFS INTO THE LUNGS EVERY 4 HOURS AS NEEDED FOR WHEEZING    Dispense:  8.5 g    Refill:  5   TRELEGY ELLIPTA 100-62.5-25 MCG/ACT AEPB    Sig: Inhale 1 puff into the lungs daily.    Dispense:  60 each    Refill:  11   magic mouthwash w/lidocaine SOLN    Sig: Swish, gargle, and spit one to two teaspoonfuls every six hours as needed. Shake well before using.    Dispense:  120 mL  Refill:  0    1 Part viscous lidocaine 2%  1 Part Maalox  1 Part diphenhydramine 12.5 mg per 5 ml elixir 1 Part Nystatin      Follow up plan: Return if symptoms worsen or fail to improve.  [name],  [facility] 08/20/2022, 3:13 PM

## 2022-08-21 NOTE — Telephone Encounter (Signed)
Requested medications are due for refill today.  Unsure  Requested medications are on the active medications list.  yes  Last refill. 07/17/2022 #20 2 refills  Future visit scheduled.   no  Notes to clinic.  Medication refill is not delegated.    Requested Prescriptions  Pending Prescriptions Disp Refills   promethazine (PHENERGAN) 25 MG tablet [Pharmacy Med Name: PROMETHAZINE HCL 25 MG TAB] 20 tablet 2    Sig: TAKE 1/2-1 TABLET BY MOUTH EVERY [place] NEEDED FOR NAUSEA OR VOMITING     Not Delegated - Gastroenterology: Antiemetics Failed - 08/20/2022  3:28 PM      Failed - This refill cannot be delegated      Passed - Valid encounter within last 6 months    Recent Outpatient Visits           Yesterday COPD with acute exacerbation [facility])   [facility] Olin Hauser, DO   1 month ago COPD with acute exacerbation [facility])   [place], DO   5 months ago Right inguinal hernia   [place], DO   5 months ago COPD with acute exacerbation [facility])   [place], DO   6 months ago Chronic bursitis of right shoulder   [facility] Olin Hauser, DO       Future Appointments             In 1 month Flora Lipps, MD [place]   In 3 months Brendolyn Patty, MD [place]

## 2022-08-29 ENCOUNTER — Encounter: Payer: Self-pay | Admitting: Dermatology

## 2022-09-16 DIAGNOSIS — M25511 Pain in right shoulder: Secondary | ICD-10-CM | POA: Diagnosis not present

## 2022-09-24 ENCOUNTER — Other Ambulatory Visit: Payer: Self-pay | Admitting: Internal Medicine

## 2022-09-25 ENCOUNTER — Other Ambulatory Visit: Payer: Self-pay | Admitting: Orthopedic Surgery

## 2022-09-25 DIAGNOSIS — S46011D Strain of muscle(s) and tendon(s) of the rotator cuff of right shoulder, subsequent encounter: Secondary | ICD-10-CM

## 2022-09-25 DIAGNOSIS — M25511 Pain in right shoulder: Secondary | ICD-10-CM

## 2022-09-26 ENCOUNTER — Encounter: Payer: Self-pay | Admitting: Internal Medicine

## 2022-09-26 ENCOUNTER — Ambulatory Visit (INDEPENDENT_AMBULATORY_CARE_PROVIDER_SITE_OTHER): Payer: 59 | Admitting: Internal Medicine

## 2022-09-26 VITALS — BP 130/60 | HR 100 | Temp 97.8°F | Ht 66.0 in | Wt 130.0 lb

## 2022-09-26 DIAGNOSIS — J441 Chronic obstructive pulmonary disease with (acute) exacerbation: Secondary | ICD-10-CM | POA: Diagnosis not present

## 2022-09-26 DIAGNOSIS — J449 Chronic obstructive pulmonary disease, unspecified: Secondary | ICD-10-CM | POA: Diagnosis not present

## 2022-09-26 MED ORDER — BREZTRI AEROSPHERE 160-9-4.8 MCG/ACT IN AERO: 2.0000 | INHALATION_SPRAY | Freq: Two times a day (BID) | RESPIRATORY_TRACT | 0 refills | Status: DC

## 2022-09-26 MED ORDER — ALBUTEROL SULFATE (2.5 MG/3ML) 0.083% IN NEBU: 2.5000 mg | INHALATION_SOLUTION | RESPIRATORY_TRACT | 12 refills | Status: DC | PRN

## 2022-09-26 MED ORDER — ALBUTEROL SULFATE HFA 108 (90 BASE) MCG/ACT IN AERS: 2.0000 | INHALATION_SPRAY | RESPIRATORY_TRACT | 10 refills | Status: DC | PRN

## 2022-09-26 MED ORDER — BREZTRI AEROSPHERE 160-9-4.8 MCG/ACT IN AERO: 2.0000 | INHALATION_SPRAY | Freq: Two times a day (BID) | RESPIRATORY_TRACT | 5 refills | Status: DC

## 2022-09-26 MED ORDER — ROFLUMILAST 500 MCG PO TABS: 500.0000 ug | ORAL_TABLET | Freq: Every day | ORAL | 10 refills | Status: DC

## 2022-09-26 NOTE — Patient Instructions (Addendum)
Change inhaler therapy to Breztri Please avoid secondhand smoke Follow-up lung cancer screening protocol

## 2022-09-26 NOTE — Progress Notes (Signed)
   Name: Caroline Conley MRN: [id number] DOB: [birth year]    CONSULTATION DATE:  09/26/2022   REFERRING MD : [name]    Previous PFT 2017 ratio 18%, Fev1 2.2L 74% fef25/75 0.78L 22%-moderate obstructive airways disease CT of the chest reviewed with patient and family at last OV  there is no lung masses noted however there is areas of emphysema in the upper lobe region bilaterally  CC: FOLLOW UP COPD     HISTORY OF PRESENT ILLNESS:  No exacerbation at this time No evidence of heart failure at this time No evidence or signs of infection at this time No respiratory distress No fevers, chills, nausea, vomiting, diarrhea No evidence of lower extremity edema No evidence hemoptysis  Trelegy does not seem to be hale helping as much Will change to [employer] inhaler Continues to take Daliresp  Exposed to secondhand smoke exposure    Allergies  Allergen Reactions   2,4-D Dimethylamine Itching    (Pt unsure of this allergy) Herbacide? Patient denies having this allergy and states she has no idea why it was added to her chart.    Vicodin [Hydrocodone-Acetaminophen] Nausea And Vomiting    GI distress   Morphine And Related Itching      Review of Systems: Gen:  Denies  fever, sweats, chills weight loss  HEENT: Denies blurred vision, double vision, ear pain, eye pain, hearing loss, nose bleeds, sore throat Cardiac:  No dizziness, chest pain or heaviness, chest tightness,edema, No JVD Resp:   +cough, -sputum production, +shortness of breath,+wheezing, -hemoptysis,  Other:  All other systems negative   BP 130/60 (BP Location: Left Arm, Cuff Size: Normal)   Pulse 100   Temp 97.8 F (36.6 C) (Temporal)   Ht '5\' 6"'$  (1.676 m)   Wt 130 lb (59 kg)   SpO2 96%   BMI 20.98 kg/m   Physical Examination:   General Appearance: No distress  EYES PERRLA, EOM intact.   NECK Supple, No JVD Pulmonary: normal breath sounds, No wheezing.  CardiovascularNormal S1,S2.  No m/r/g.    Abdomen: Benign, Soft, non-tender. ALL OTHER ROS ARE NEGATIVE     CT chest 08/2019 1.Moderate centrilobular emphysema.  Emphysema (ICD10-J43.9).   2. Diffuse bilateral bronchial wall thickening and mucous plugging, particularly in the lower lobes, consistent with nonspecific infectious or inflammatory bronchitis.   3.  Aortic Atherosclerosis (ICD10-I70.0).  CT chest April 2023 Very small right lower lobe lung nodule 3.5 mm Follow-up CT chest annually   ASSESSMENT AND PLAN  56 year old white female with moderate to severe COPD Gold stage D  Previous history and recurrent bouts of COPD exacerbation in the setting of constant exposure to secondhand smoke,    COPD Gold stage D  Trelegy does not seem to be as effective as before We will start Breztri inhaler Daliresp 500 mg daily Recommend avoiding secondhand smoke   Patient advised to avoid secondhand smoke exposure  Follow-up CT scan lung cancer screening protocol  MEDICATION ADJUSTMENTS/LABS AND TESTS ORDERED: DALIRESP Start Breztri  CURRENT MEDICATIONS REVIEWED AT LENGTH WITH PATIENT TODAY    Follow-up in 1 year  Total time spent 21 minutes  [name] [name], M.D.  [name]  Medical Director [place] Director [facility] Cardio-Pulmonary Department

## 2022-09-30 ENCOUNTER — Ambulatory Visit
Admission: RE | Admit: 2022-09-30 | Discharge: 2022-09-30 | Disposition: A | Discharge status: Home / Self Care | Payer: 59 | Source: Ambulatory Visit | Attending: Orthopedic Surgery | Admitting: Orthopedic Surgery

## 2022-09-30 DIAGNOSIS — M25511 Pain in right shoulder: Secondary | ICD-10-CM

## 2022-09-30 DIAGNOSIS — S46011D Strain of muscle(s) and tendon(s) of the rotator cuff of right shoulder, subsequent encounter: Secondary | ICD-10-CM

## 2022-10-01 ENCOUNTER — Ambulatory Visit: Payer: 59 | Admitting: Family Medicine

## 2022-10-10 DIAGNOSIS — S46011D Strain of muscle(s) and tendon(s) of the rotator cuff of right shoulder, subsequent encounter: Secondary | ICD-10-CM | POA: Diagnosis not present

## 2022-10-21 ENCOUNTER — Other Ambulatory Visit: Payer: Self-pay | Admitting: Orthopedic Surgery

## 2022-10-21 ENCOUNTER — Encounter: Payer: Self-pay | Admitting: Orthopedic Surgery

## 2022-10-22 ENCOUNTER — Other Ambulatory Visit: Payer: Self-pay | Admitting: Family Medicine

## 2022-10-22 DIAGNOSIS — B370 Candidal stomatitis: Secondary | ICD-10-CM

## 2022-10-22 DIAGNOSIS — R112 Nausea with vomiting, unspecified: Secondary | ICD-10-CM

## 2022-10-22 NOTE — Telephone Encounter (Signed)
Requested medication (s) are due for refill today - provider review   Requested medication (s) are on the active medication list -yes  Future visit scheduled -no  Last refill: fluconazole 06/27/22 #20 2RF                  Promethazine 08/22/22 #20 2RF  Notes to clinic: medication not assigned protocol- provider review , non delegated Rx  Requested Prescriptions  Pending Prescriptions Disp Refills   fluconazole (DIFLUCAN) 150 MG tablet [Pharmacy Med Name: FLUCONAZOLE 150 MG TAB] 6 tablet 3    Sig: TAKE 1 TABLET BY MOUTH ON DAY 1 . REPEAT2ND DOSE ON DAY 3     Off-Protocol Failed - 10/22/2022  3:39 PM      Failed - Medication not assigned to a protocol, review manually.      Passed - Valid encounter within last 12 months    Recent Outpatient Visits           2 months ago COPD with acute exacerbation ([place])   [place], DO   3 months ago COPD with acute exacerbation [facility])   [place], DO   7 months ago Right inguinal hernia   [place], DO   7 months ago COPD with acute exacerbation [facility])   [place], DO   8 months ago Chronic bursitis of right shoulder   [facility] Olin Hauser, DO       Future Appointments             In 1 month Brendolyn Patty, MD [place]             promethazine (PHENERGAN) 25 MG tablet [Pharmacy Med Name: PROMETHAZINE HCL 25 MG TAB] 20 tablet 2    Sig: TAKE 1/2-1 TABLET BY MOUTH EVERY [place] NEEDED FOR NAUSEA OR VOMITING     Not Delegated - Gastroenterology: Antiemetics Failed - 10/22/2022  3:39 PM      Failed - This refill cannot be delegated      Passed - Valid encounter within last 6 months    Recent Outpatient Visits           2 months ago COPD with acute exacerbation [facility])   [facility] Olin Hauser, DO    3 months ago COPD with acute exacerbation [facility])   [facility] Olin Hauser, DO   7 months ago Right inguinal hernia   [facility] [place], [place]ought[place], DO   7 months ago COPD with acute exacerbation [facility])   [place], DO   8 months ago Chronic bursitis of right shoulder   [place], DO       Future Appointments             In 1 month Brendolyn Patty, MD [place]               Requested Prescriptions  Pending Prescriptions Disp Refills   fluconazole (DIFLUCAN) 150 MG tablet [Pharmacy Med Name: FLUCONAZOLE 150 MG TAB] 6 tablet 3    Sig: TAKE 1 TABLET BY MOUTH ON DAY 1 . REPEAT2ND DOSE ON DAY 3     Off-Protocol Failed - 10/22/2022  3:39 PM      Failed - Medication not assigned to a protocol,  review manually.      Passed - Valid encounter within last 12 months    Recent Outpatient Visits           2 months ago COPD with acute exacerbation ([place])   [place], DO   3 months ago COPD with acute exacerbation [facility])   [place], DO   7 months ago Right inguinal hernia   [place], DO   7 months ago COPD with acute exacerbation [facility])   [place], DO   8 months ago Chronic bursitis of right shoulder   [facility] Olin Hauser, DO       Future Appointments             In 1 month Brendolyn Patty, MD [place]             promethazine (PHENERGAN) 25 MG tablet [Pharmacy Med Name: PROMETHAZINE HCL 25 MG TAB] 20 tablet 2    Sig: TAKE 1/2-1 TABLET BY MOUTH EVERY [place] NEEDED FOR NAUSEA OR VOMITING     Not Delegated - Gastroenterology: Antiemetics Failed - 10/22/2022  3:39 PM      Failed - This refill cannot be delegated       Passed - Valid encounter within last 6 months    Recent Outpatient Visits           2 months ago COPD with acute exacerbation [facility])   [place], DO   3 months ago COPD with acute exacerb[facility])   [place], DO   7 months ago Right inguinal hernia   [place], DO   7 months ago COPD with acute exacerbation [facility])   [place], DO   8 months ago Chronic bursitis of right shoulder   [place], DO       Future Appointments             In 1 month Brendolyn Patty, MD [place]

## 2022-10-25 ENCOUNTER — Ambulatory Visit: Payer: 59 | Admitting: General Practice

## 2022-10-25 ENCOUNTER — Ambulatory Visit
Admission: RE | Admit: 2022-10-25 | Discharge: 2022-10-25 | Disposition: A | Discharge status: Home / Self Care | Payer: 59 | Attending: Orthopedic Surgery | Admitting: Orthopedic Surgery

## 2022-10-25 ENCOUNTER — Encounter
Admission: RE | Disposition: A | Discharge status: Home / Self Care | Payer: Self-pay | Source: Home / Self Care | Attending: Orthopedic Surgery

## 2022-10-25 ENCOUNTER — Encounter: Payer: Self-pay | Admitting: Orthopedic Surgery

## 2022-10-25 ENCOUNTER — Other Ambulatory Visit: Payer: Self-pay

## 2022-10-25 DIAGNOSIS — M19011 Primary osteoarthritis, right shoulder: Secondary | ICD-10-CM | POA: Insufficient documentation

## 2022-10-25 DIAGNOSIS — K219 Gastro-esophageal reflux disease without esophagitis: Secondary | ICD-10-CM | POA: Insufficient documentation

## 2022-10-25 DIAGNOSIS — J449 Chronic obstructive pulmonary disease, unspecified: Secondary | ICD-10-CM | POA: Diagnosis not present

## 2022-10-25 DIAGNOSIS — I428 Other cardiomyopathies: Secondary | ICD-10-CM | POA: Insufficient documentation

## 2022-10-25 DIAGNOSIS — I2510 Atherosclerotic heart disease of native coronary artery without angina pectoris: Secondary | ICD-10-CM | POA: Insufficient documentation

## 2022-10-25 DIAGNOSIS — M25811 Other specified joint disorders, right shoulder: Secondary | ICD-10-CM | POA: Diagnosis present

## 2022-10-25 DIAGNOSIS — I509 Heart failure, unspecified: Secondary | ICD-10-CM | POA: Insufficient documentation

## 2022-10-25 DIAGNOSIS — Z87891 Personal history of nicotine dependence: Secondary | ICD-10-CM | POA: Insufficient documentation

## 2022-10-25 DIAGNOSIS — M7581 Other shoulder lesions, right shoulder: Secondary | ICD-10-CM | POA: Diagnosis not present

## 2022-10-25 DIAGNOSIS — M75111 Incomplete rotator cuff tear or rupture of right shoulder, not specified as traumatic: Secondary | ICD-10-CM | POA: Diagnosis not present

## 2022-10-25 DIAGNOSIS — M75101 Unspecified rotator cuff tear or rupture of right shoulder, not specified as traumatic: Secondary | ICD-10-CM | POA: Insufficient documentation

## 2022-10-25 DIAGNOSIS — S46011D Strain of muscle(s) and tendon(s) of the rotator cuff of right shoulder, subsequent encounter: Secondary | ICD-10-CM | POA: Diagnosis not present

## 2022-10-25 DIAGNOSIS — M7541 Impingement syndrome of right shoulder: Secondary | ICD-10-CM | POA: Diagnosis not present

## 2022-10-25 HISTORY — PX: SHOULDER ARTHROSCOPY: SHX128

## 2022-10-25 SURGERY — ARTHROSCOPY, SHOULDER
Anesthesia: General | Site: Shoulder | Laterality: Right

## 2022-10-25 MED ORDER — LACTATED RINGERS IV SOLN
INTRAVENOUS | Status: DC | PRN
  Administered 2022-10-25: 12000 mL

## 2022-10-25 MED ORDER — PROPOFOL 10 MG/ML IV BOLUS
INTRAVENOUS | Status: DC | PRN
  Administered 2022-10-25: 80 mg via INTRAVENOUS
  Administered 2022-10-25: 120 mg via INTRAVENOUS

## 2022-10-25 MED ORDER — CEFAZOLIN SODIUM-DEXTROSE 2-4 GM/100ML-% IV SOLN
2.0000 g | INTRAVENOUS | Status: AC
  Administered 2022-10-25: 2 g via INTRAVENOUS

## 2022-10-25 MED ORDER — ASPIRIN 325 MG PO TBEC: 325.0000 mg | DELAYED_RELEASE_TABLET | Freq: Every day | ORAL | 0 refills | Status: AC

## 2022-10-25 MED ORDER — FENTANYL CITRATE (PF) 100 MCG/2ML IJ SOLN
INTRAMUSCULAR | Status: DC | PRN
  Administered 2022-10-25: 50 ug via INTRAVENOUS

## 2022-10-25 MED ORDER — OXYCODONE HCL 5 MG PO TABS: 5.0000 mg | ORAL_TABLET | Freq: Once | ORAL | Status: DC | PRN

## 2022-10-25 MED ORDER — OXYCODONE HCL 5 MG PO TABS: 5.0000 mg | ORAL_TABLET | ORAL | 0 refills | Status: DC | PRN

## 2022-10-25 MED ORDER — LACTATED RINGERS IV SOLN: INTRAVENOUS | Status: DC | PRN

## 2022-10-25 MED ORDER — DEXAMETHASONE SODIUM PHOSPHATE 4 MG/ML IJ SOLN
INTRAMUSCULAR | Status: DC | PRN
  Administered 2022-10-25: 8 mg via INTRAVENOUS

## 2022-10-25 MED ORDER — ONDANSETRON HCL 4 MG/2ML IJ SOLN
INTRAMUSCULAR | Status: DC | PRN
  Administered 2022-10-25: 4 mg via INTRAVENOUS

## 2022-10-25 MED ORDER — LACTATED RINGERS IV SOLN: INTRAVENOUS | Status: DC

## 2022-10-25 MED ORDER — ONDANSETRON 4 MG PO TBDP: 4.0000 mg | ORAL_TABLET | Freq: Three times a day (TID) | ORAL | 0 refills | Status: DC | PRN

## 2022-10-25 MED ORDER — MIDAZOLAM HCL 5 MG/5ML IJ SOLN
INTRAMUSCULAR | Status: DC | PRN
  Administered 2022-10-25: 2 mg via INTRAVENOUS

## 2022-10-25 MED ORDER — BUPIVACAINE LIPOSOME 1.3 % IJ SUSP
INTRAMUSCULAR | Status: DC | PRN
  Administered 2022-10-25: 20 mL

## 2022-10-25 MED ORDER — SUCCINYLCHOLINE CHLORIDE 200 MG/10ML IV SOSY
PREFILLED_SYRINGE | INTRAVENOUS | Status: DC | PRN
  Administered 2022-10-25: 100 mg via INTRAVENOUS

## 2022-10-25 MED ORDER — OXYCODONE HCL 5 MG/5ML PO SOLN: 5.0000 mg | Freq: Once | ORAL | Status: DC | PRN

## 2022-10-25 MED ORDER — FENTANYL CITRATE PF 50 MCG/ML IJ SOSY: 25.0000 ug | PREFILLED_SYRINGE | INTRAMUSCULAR | Status: DC | PRN

## 2022-10-25 MED ORDER — PHENYLEPHRINE HCL (PRESSORS) 10 MG/ML IV SOLN
INTRAVENOUS | Status: DC | PRN
  Administered 2022-10-25 (×3): 100 ug via INTRAVENOUS

## 2022-10-25 MED ORDER — ACETAMINOPHEN 500 MG PO TABS: 1000.0000 mg | ORAL_TABLET | Freq: Three times a day (TID) | ORAL | 2 refills | Status: DC

## 2022-10-25 MED ORDER — BUPIVACAINE HCL (PF) 0.5 % IJ SOLN
INTRAMUSCULAR | Status: DC | PRN
  Administered 2022-10-25: 10 mL

## 2022-10-25 SURGICAL SUPPLY — 61 items
ADPR IRR PORT MULTIBAG TUBE (MISCELLANEOUS) ×1
ANCH SUT 2 SWLK 19.1 CLS EYLT (Anchor) ×2 IMPLANT
ANCH SUT 5 3.9 CRKSW KNTLS (Anchor) ×1 IMPLANT
ANCH SUT SWLK 19.1X5.5 CLS (Anchor) ×1 IMPLANT
ANCHOR 3.9 PEEK CORKSCREW 5MTS (Anchor) IMPLANT
ANCHOR ICONIX SPEED 2.3 (Anchor) IMPLANT
ANCHOR SWIVELOCK BIO 4.75X19.1 (Anchor) IMPLANT
ANCHOR SWIVELOCK BIO COMP (Anchor) IMPLANT
APL PRP STRL LF DISP 70% ISPRP (MISCELLANEOUS) ×1
BLADE SHAVER 4.5X7 STR FR (MISCELLANEOUS) ×1 IMPLANT
BUR BR 5.5 WIDE MOUTH (BURR) ×1 IMPLANT
CANNULA PART THRD DISP 5.75X7 (CANNULA) ×1 IMPLANT
CHLORAPREP W/TINT 26 (MISCELLANEOUS) ×1 IMPLANT
COOLER POLAR GLACIER W/PUMP (MISCELLANEOUS) ×1 IMPLANT
COVER LIGHT HANDLE UNIVERSAL (MISCELLANEOUS) IMPLANT
DRAPE U-SHAPE 48X52 POLY STRL (PACKS) ×2 IMPLANT
ELECT REM PT RETURN 9FT ADLT (ELECTROSURGICAL) ×1
ELECTRODE REM PT RTRN 9FT ADLT (ELECTROSURGICAL) ×1 IMPLANT
GAUZE SPONGE 4X4 12PLY STRL (GAUZE/BANDAGES/DRESSINGS) ×1 IMPLANT
GAUZE XEROFORM 1X8 LF (GAUZE/BANDAGES/DRESSINGS) ×1 IMPLANT
GLOVE SRG 8 PF TXTR STRL LF DI (GLOVE) ×1 IMPLANT
GLOVE SURG ENC MOIS LTX SZ7.5 (GLOVE) ×2 IMPLANT
GLOVE SURG ENC MOIS LTX SZ8 (GLOVE) ×1 IMPLANT
GLOVE SURG UNDER POLY LF SZ8 (GLOVE) ×1
GOWN STRL REUS W/ TWL LRG LVL3 (GOWN DISPOSABLE) ×1 IMPLANT
GOWN STRL REUS W/ TWL XL LVL3 (GOWN DISPOSABLE) ×1 IMPLANT
GOWN STRL REUS W/TWL LRG LVL3 (GOWN DISPOSABLE) ×1
GOWN STRL REUS W/TWL XL LVL3 (GOWN DISPOSABLE) ×1
IV LACTATED RINGER IRRG 3000ML (IV SOLUTION) ×4
IV LR IRRIG 3000ML ARTHROMATIC (IV SOLUTION) ×6 IMPLANT
KIT CORKSCREW KNTLS 3.9 S/T/P (INSTRUMENTS) IMPLANT
KIT STABILIZATION SHOULDER (MISCELLANEOUS) ×1 IMPLANT
KIT TURNOVER KIT A (KITS) ×1 IMPLANT
MANIFOLD NEPTUNE II (INSTRUMENTS) ×2 IMPLANT
MASK FACE SPIDER DISP (MASK) ×1 IMPLANT
MAT ABSORB  FLUID 56X50 GRAY (MISCELLANEOUS) ×2
MAT ABSORB FLUID 56X50 GRAY (MISCELLANEOUS) ×2 IMPLANT
NDL MAYO CATGUT SZ 2 (NEEDLE) IMPLANT
NEEDLE MAYO CATGUT SZ 1.5 (NEEDLE) ×1
NEEDLE MAYO CATGUT SZ 2 (NEEDLE) ×1 IMPLANT
PACK ARTHROSCOPY SHOULDER (MISCELLANEOUS) ×1 IMPLANT
PAD WRAPON POLAR SHDR XLG (MISCELLANEOUS) ×1 IMPLANT
PASSER SUT FIRSTPASS SELF (INSTRUMENTS) IMPLANT
SET Y ADAPTER MULIT-BAG IRRIG (MISCELLANEOUS) ×1 IMPLANT
SUT ETHILON 3-0 FS-10 30 BLK (SUTURE) ×1
SUT MNCRL 4-0 (SUTURE) ×1
SUT MNCRL 4-0 27XMFL (SUTURE) ×1
SUT VIC AB 0 CT1 36 (SUTURE) IMPLANT
SUT VIC AB 2-0 CT2 27 (SUTURE) IMPLANT
SUTURE EHLN 3-0 FS-10 30 BLK (SUTURE) IMPLANT
SUTURE MNCRL 4-0 27XMF (SUTURE) IMPLANT
SUTURE TAPE 1.3 40 TPR END (SUTURE) IMPLANT
SUTURETAPE 1.3 40 TPR END (SUTURE) ×2
SUTURETAPE 1.3 40 W/NDL BLK/WH (SUTURE) IMPLANT
SUTURETAPE 1.3 40 W/NDL BLUE (SUTURE) IMPLANT
SYR 5ML LL (SYRINGE) ×1 IMPLANT
TAPE MICROFOAM 4IN (TAPE) ×1 IMPLANT
TUBING INFLOW SET DBFLO PUMP (TUBING) ×1 IMPLANT
TUBING OUTFLOW SET DBLFO PUMP (TUBING) ×1 IMPLANT
WAND WEREWOLF FLOW 90D (MISCELLANEOUS) ×1 IMPLANT
WRAPON POLAR PAD SHDR XLG (MISCELLANEOUS) ×1

## 2022-10-25 NOTE — Transfer of Care (Signed)
Immediate Anesthesia Transfer of Care Note  Patient: Caroline Conley  Procedure(s) Performed: Right shoulder arthroscopic mini open rotator cuff repair, subacromial decompression, and distal clavicle excision (Right: Shoulder)  Patient Location: PACU  Anesthesia Type: General  Level of Consciousness: awake, alert  and patient cooperative  Airway and Oxygen Therapy: Patient Spontanous Breathing and Patient connected to supplemental oxygen  Post-op Assessment: Post-op Vital signs reviewed, Patient's Cardiovascular Status Stable, Respiratory Function Stable, Patent Airway and No signs of Nausea or vomiting  Post-op Vital Signs: Reviewed and stable  Complications: No notable events documented.

## 2022-10-25 NOTE — Anesthesia Procedure Notes (Signed)
Anesthesia Regional Block: Interscalene brachial plexus block   Pre-Anesthetic Checklist: , timeout performed,  Correct Patient, Correct Site, Correct Laterality,  Correct Procedure, Correct Position, site marked,  Risks and benefits discussed,  Surgical consent,  Pre-op evaluation,  At surgeon's request and post-op pain management  Laterality: Right  Prep: chloraprep       Needles:  Injection technique: Single-shot  Needle Type: Echogenic Needle     Needle Length: 4cm  Needle Gauge: 25     Additional Needles:   Procedures:,,,, ultrasound used (permanent image in chart),,    Narrative:  Start time: 10/25/2022 9:25 AM End time: 10/25/2022 9:29 AM Injection made incrementally with aspirations every 5 mL.  Performed by: Personally  Anesthesiologist: Dimas Millin, MD  Additional Notes: Patient's chart reviewed and they were deemed appropriate candidate for procedure, at surgeon's request. Patient educated about risks, benefits, and alternatives of the block including but not limited to: temporary or permanent nerve damage, bleeding, infection, damage to surround tissues, pneumothorax, hemidiaphragmatic paralysis, unilateral Horner's syndrome, block failure, local anesthetic toxicity. Patient expressed understanding. A formal time-out was conducted consistent with institution rules.  Monitors were applied, and minimal sedation used (see nursing record). The site was prepped with skin prep and allowed to dry, and sterile gloves were used. A high frequency linear ultrasound probe with probe cover was utilized throughout. C5-7 nerve roots located and appeared anatomically normal, local anesthetic injected around them, and echogenic block needle trajectory was monitored throughout. Aspiration performed every 81m. Lung and blood vessels were avoided. All injections were performed without resistance and free of blood and paresthesias. The patient tolerated the procedure well.  Injectate:  213mexparel + 1026m.5% bupivacaine

## 2022-10-25 NOTE — Discharge Instructions (Signed)
Post-Op Instructions - Rotator Cuff Repair  1. Bracing: You will wear a shoulder immobilizer or sling for 6 weeks.   2. Driving: No driving for 3 weeks post-op. When driving, do not wear the immobilizer. Ideally, we recommend no driving for 6 weeks while sling is in place as one arm will be immobilized.   3. Activity: No active lifting for 2 months. Wrist, hand, and elbow motion only. Avoid lifting the upper arm away from the body except for hygiene. You are permitted to bend and straighten the elbow passively only (no active elbow motion). You may use your hand and wrist for typing, writing, and managing utensils (cutting food). Do not lift more than a coffee cup for 8 weeks.  When sleeping or resting, inclined positions (recliner chair or wedge pillow) and a pillow under the forearm for support may provide better comfort for up to 4 weeks.  Avoid long distance travel for 4 weeks.  Return to normal activities after rotator cuff repair repair normally takes 6 months on average. If rehab goes very well, may be able to do most activities at 4 months, except overhead or contact sports.  4. Physical Therapy: Begins 3-4 days after surgery, and proceed 1 time per week for the first 6 weeks, then 1-2 times per week from weeks 6-20 post-op.  5. Medications:  - You will be provided a prescription for narcotic pain medicine. After surgery, take 1-2 narcotic tablets every 4 hours if needed for severe pain.  - A prescription for anti-nausea medication will be provided in case the narcotic medicine causes nausea - take 1 tablet every 6 hours only if nauseated.   - Take tylenol 1000 mg (2 Extra Strength tablets or 3 regular strength) every 8 hours for pain.  May decrease or stop tylenol 5 days after surgery if you are having minimal pain. - Take ASA 325mg/day x 2 weeks to help prevent DVTs/PEs (blood clots).  - DO NOT take ANY nonsteroidal anti-inflammatory pain medications (Advil, Motrin, Ibuprofen, Aleve,  Naproxen, or Naprosyn). These medicines can inhibit healing of your shoulder repair.    If you are taking prescription medication for anxiety, depression, insomnia, muscle spasm, chronic pain, or for attention deficit disorder, you are advised that you are at a higher risk of adverse effects with use of narcotics post-op, including narcotic addiction/dependence, depressed breathing, death. If you use non-prescribed substances: alcohol, marijuana, cocaine, heroin, methamphetamines, etc., you are at a higher risk of adverse effects with use of narcotics post-op, including narcotic addiction/dependence, depressed breathing, death. You are advised that taking > 50 morphine milligram equivalents (MME) of narcotic pain medication per day results in twice the risk of overdose or death. For your prescription provided: oxycodone 5 mg - taking more than 6 tablets per day would result in > 50 morphine milligram equivalents (MME) of narcotic pain medication. Be advised that we will prescribe narcotics short-term, for acute post-operative pain only - 3 weeks for major operations such as shoulder repair/reconstruction surgeries.     6. Post-Op Appointment:  Your first post-op appointment will be 10-14 days post-op.  7. Work or School: For most, but not all procedures, we advise staying out of work or school for at least 1 to 2 weeks in order to recover from the stress of surgery and to allow time for healing.   If you need a work or school note this can be provided.   8. Smoking: If you are a smoker, you need to refrain from   smoking in the postoperative period. The nicotine in cigarettes will inhibit healing of your shoulder repair and decrease the chance of successful repair. Similarly, nicotine containing products (gum, patches) should be avoided.   Post-operative Brace: Apply and remove the brace you received as you were instructed to at the time of fitting and as described in detail as the brace's  instructions for use indicate.  Wear the brace for the period of time prescribed by your physician.  The brace can be cleaned with soap and water and allowed to air dry only.  Should the brace result in increased pain, decreased feeling (numbness/tingling), increased swelling or an overall worsening of your medical condition, please contact your doctor immediately.  If an emergency situation occurs as a result of wearing the brace after normal business hours, please dial 911 and seek immediate medical attention.  Let your doctor know if you have any further questions about the brace issued to you. Refer to the shoulder sling instructions for use if you have any questions regarding the correct fit of your shoulder sling.  [place] for Troubleshooting: [phone]  Video that illustrates how to properly use a shoulder sling: "Instructions for Proper Use of an Orthopaedic Sling" [email or web address]

## 2022-10-25 NOTE — H&P (Signed)
Paper H&P to be scanned into permanent record. H&P reviewed. No significant changes noted.  

## 2022-10-25 NOTE — Anesthesia Procedure Notes (Addendum)
Procedure Name: LMA Insertion Date/Time: 10/25/2022 10:05 AM  Performed by: Tobie Poet, CRNAPre-anesthesia Checklist: Patient identified, Emergency Drugs available, Suction available and Patient being monitored Patient Re-evaluated:Patient Re-evaluated prior to induction Oxygen Delivery Method: Circle system utilized Preoxygenation: Pre-oxygenation with 100% oxygen Induction Type: IV induction Ventilation: Mask ventilation without difficulty LMA: LMA inserted LMA Size: 4.0 Tube type: Oral Number of attempts: 1 Placement Confirmation: positive ETCO2 and breath sounds checked- equal and bilateral Tube secured with: Tape Dental Injury: Teeth and Oropharynx as per pre-operative assessment

## 2022-10-25 NOTE — Op Note (Signed)
SURGERY DATE: 10/25/2022  PRE-OP DIAGNOSIS:  1. Right subacromial impingement 2. Right rotator cuff tear 3. Right acromioclavicular joint osteoarthritis  POST-OP DIAGNOSIS: 1. Right subacromial impingement 2. Right rotator cuff tear (subscapularis and supraspinatus) 3. Right acromioclavicular joint osteoarthritis  PROCEDURES:  1. Right arthroscopic rotator cuff repair (subscapularis) 2. Right mini-open rotator cuff repair (supraspinatus) 3. Right arthroscopic distal clavicle excision 4. Right arthroscopic extensive debridement of shoulder (glenohumeral and subacromial spaces) 5. Right arthroscopic subacromial decompression  SURGEON: Cato Mulligan, MD  ASSISTANT: Anitra Lauth, PA   ANESTHESIA: Gen with Exparel interscalene block  ESTIMATED BLOOD LOSS: 25cc  DRAINS:  none  TOTAL IV FLUIDS: per anesthesia   SPECIMENS: none  IMPLANTS:  - Arthrex 4.82m SwiveLock x2 - Arthrex 5.53mSwiveLock x1 - Arthrex 3.35m535mnotless Fibertak x1  - Iconix SPEED double loaded with 1.2 and 2.0mm1mpe x 1   OPERATIVE FINDINGS:  Examination under anesthesia: A careful examination under anesthesia was performed.  Passive range of motion was: FF: 150; ER at side: 70; ER in abduction: 110; IR in abduction: 60.  Anterior load shift: NT.  Posterior load shift: NT.  Sulcus in neutral: NT.  Sulcus in ER: NT.    Intra-operative findings: A thorough arthroscopic examination of the shoulder was performed.  The findings are: 1. Biceps tendon: not visualized within the joint 2. Superior labrum: injected with surrounding synovitis 3. Posterior labrum and capsule: normal 4. Inferior capsule and inferior recess: normal 5. Glenoid cartilage surface: Grade 1 changes with mild surface fibrillation superiorly 6. Supraspinatus attachment: full-thickness V-shaped tear with apex extending to the glenoid 7. Posterior rotator cuff attachment: normal 8. Humeral head articular cartilage: Grade 1 degenerative  changes 9. Rotator interval: significant synovitis 10: Subscapularis tendon: Upper border tear of the subscapularis 11. Anterior labrum: degenerative 12. IGHL: normal  OPERATIVE REPORT:   Indications for procedure: Caroline Conley 55 y25. female with over 1 year of L shoulder pain that has failed non-operative management including activity modification, physical therapy, medical management and corticosteroid injection without adequate relief of symptoms.  Of note, she has had prior mini open rotator cuff repair and revision arthroscopic rotator cuff repair by other physician with last surgery being over 3 years ago.  Clinical exam and MRI were suggestive of rotator cuff re-tear, biceps tendinopathy, subacromial impingement, and acromioclavicular joint arthritis. After discussion of risks, benefits, and alternatives to surgery, the patient elected to proceed.  Additionally, we did have a lengthy preoperative discussion about negative effects of smoking on rotator cuff repair.  Procedure in detail:  I identified Caroline Drossthe pre-operative holding area.  I marked the operative shoulder with my initials. I reviewed the risks and benefits of the proposed surgical intervention, and the patient (and/or patient's guardian) wished to proceed.  Anesthesia was then performed with an Exparel interscalene block.  The patient was transferred to the operative suite and placed in the beach chair position.    SCDs were placed on the lower extremities. Appropriate IV antibiotics were administered prior to incision. The operative upper extremity was then prepped and draped in standard fashion. A time out was performed confirming the correct extremity, correct patient, and correct procedure.   I then created a standard posterior portal with an 11 blade. The glenohumeral joint was easily entered with a blunt trochar and the arthroscope introduced. The findings of diagnostic arthroscopy are described above. I  debrided degenerative tissue including the synovitic tissue about the rotator interval and  anterior and superior labrum. I then coagulated the inflamed synovium to obtain hemostasis and reduce the risk of post-operative swelling using an Arthrocare radiofrequency device.  Next, arthroscopic repair of the subscapularis was performed. The lesser tuberosity footprint was prepared with a combination of electrocautery and an arthroscopic curette.  An Arthrex knotless corkscrew was placed into the lesser tuberosity footprint from the anterior portal.  A BirdBeak was used to shuttle the repair suture through the upper border of the subscapularis tendon.  The suture was then shuttled through the anchor. With the arm in neutral rotation, the repair was tensioned appropriately. This appropriately reduced the subscapularis tear.  The arm was then internally and externally rotated and the subscapularis was noted to move appropriately with rotation.  The remainder of the suture was then cut.   Next, the arthroscope was then introduced into the subacromial space. A direct lateral portal was created with an 11-blade after spinal needle localization. An extensive subacromial bursectomy and debridement was performed using a combination of the shaver and Arthrocare wand. The entire acromial undersurface was exposed and the CA ligament was subperiosteally elevated to expose the anterior acromial hook. A 5.87m barrel burr was used to create a flat anterior and lateral aspect of the acromion, converting it from a Type 2 to a Type 1 acromion. Care was made to keep the deltoid fascia intact.  I then turned my attention to the arthroscopic distal clavicle excision. I identified the acromioclavicular joint. Surrounding bursal tissue was debrided and the edges of the joint were identified. I used the 5.510mbarrel burr to remove the distal clavicle parallel to the edge of the acromion. I was able to fit two widths of the burr into the  space between the distal clavicle and acromion, signifying that I had removed ~1127mf distal clavicle. This was confirmed by viewing anteriorly and introducing a probe with measuring marks from the lateral portal. Hemostasis was achieved with an Arthrocare wand. Fluid was evacuated from the shoulder.   A longitudinal incision from the anterolateral acromion ~6cm in length was made overlying the raphe between the anterior and middle heads of the deltoid. The raphe was identified and it was incised. The subacromial space was identified. Any remaining bursa was excised. The rotator cuff tear was identified. It was a deep, narrow V-shaped tear with apex just lateral to the glenoid.  The rotator cuff footprint was cleared of soft tissue. A rongeur was used to gently decorticate the rotator cuff footprint to allow for improved healing.  2 margin convergence sutures were placed in a side-to-side fashion.  Next, 1 Iconix SPEED anchors was placed just lateral to the articular margin anteriorly.  A second medial row anchor consisting of a 5.5 mm SwiveLock anchor (use due to large cyst posteriorly at the articular margin) double loaded with tape was also placed.  The rotator cuff was mobilized using key elevators both superior and inferior to the tear.The margin convergence sutures were tied with a knot pusher. This converted the tear from a V-shaped tear to a U-shaped tear.  The FiberWire sutures from the posterior medial row SwiveLock anchor were also passed in a margin convergence fashion.  The rotator cuff was able to be reduced to its footprint and then held in a reduced position with graspers. The remainder of the 8 strands of tape from the 2 medial row anchors were passed through the rotator cuff. the posterior sutures from each anchor were tied with a knot pusher, thus reducing the  rotator cuff. Two SwiveLock anchors were placed for the lateral row anchors with one limb of each of the medial row sutures passed  through each anchor.  This allowed for reapproximation of the rotator cuff over its footprint.  The FiberWire sutures from the posterior medial row SwiveLock anchor was then tied further reducing the rotator cuff down to the bone at the articular margin posteriorly.  This construct was stable with external and internal rotation and allowed for appropriate reapproximation of the rotator cuff to its natural footprint..  The wound was thoroughly irrigated.  The deltoid split was closed with 0 Vicryl.  The subdermal layer was closed with 2-0 Vicryl.  The skin was closed with staples. The portals were closed with 3-0 Nylon. Xeroform was applied to the incisions. A sterile dressing was applied, followed by a Polar Care sleeve and a SlingShot shoulder immobilizer/sling. The patient was awakened from anesthesia without difficulty and was transferred to the PACU in stable condition.   Of note, assistance from a PA was essential to performing the surgery.  PA was present for the entire surgery.  PA assisted with patient positioning, retraction, instrumentation, and wound closure. The surgery would have been more difficult and had longer operative time without PA assistance.    COMPLICATIONS: none  DISPOSITION: plan for discharge home after recovery in PACU   POSTOPERATIVE PLAN: Remain in sling (except hygiene and elbow/wrist/hand RoM exercises as instructed by PT) x 6 weeks and NWB for this time. PT to begin 3-4 days after surgery.  Utilize large rotator cuff repair with subscapularis repair rehab protocol. ASA '325mg'$  daily x 2 weeks for DVT ppx.

## 2022-10-25 NOTE — Anesthesia Preprocedure Evaluation (Addendum)
Anesthesia Evaluation  Patient identified by MRN, date of birth, ID band Patient awake    Reviewed: Allergy & Precautions, NPO status , Patient's Chart, lab work & pertinent test results  History of Anesthesia Complications Negative for: history of anesthetic complications  Airway Mallampati: II   Neck ROM: Full    Dental  (+) Edentulous Upper, Edentulous Lower   Pulmonary COPD,  COPD inhaler, Patient abstained from smoking., former smoker   Pulmonary exam normal breath sounds clear to auscultation       Cardiovascular + CAD and +CHF (NICM)  Normal cardiovascular exam Rhythm:Regular Rate:Normal  Myocardial perfusion 06/25/21:  - Defect 1: There is a medium defect of mild severity present in the mid anterior and apical anterior location. - Findings consistent with possible LAD ischemia. - This is an intermediate risk study. - The left ventricular ejection fraction is normal (55-65%). - Suboptimal study overall due to GI uptake as well as bundle branch block with decreased specificity of the above findings. Consider alternative testing. - CT attenuation images with no significant aortic or coronary calcifications.  Echo 07/02/21:  Normal LV size and wall thickness.  LVEF 35-40% with global hypokinesis.  Normal RV size and function.  Mild MR.  No significant change from prior echo on 06/22/2021.   Neuro/Psych negative neurological ROS     GI/Hepatic ,GERD  ,,  Endo/Other  negative endocrine ROS    Renal/GU negative Renal ROS     Musculoskeletal   Abdominal   Peds  Hematology negative hematology ROS (+)   Anesthesia Other Findings Patient with a history of an TTE in 2/23. Report shows EF of 40% and grade 2 diastolic dysfunction. Discussed with patient her heart history and patient was unaware of any heart problems.  Discussed the need to follow up with her cardiologist to discuss findings on her echo.    Patient has a  history of COPD. She states it is mild and doesn't affect her daily life. States that she will get short of breath when she is walking at a fast pace. Dicussed the risks and benefits of an interscale block and the risk of decreased respiratory effort. She stated that she previously had a block on her right side and never had any breathing problems. Discussed the need to rest for the next 2-3 days and not do anything that would increase her respiratory needs. Also stated she needed to go straight to the ED for any symptoms of shortness of breath or chest pain. She stated she understood the risks and agreed to having the nerve block.    Chest pain and tachycardia: Chest pain is atypical and seems most consistent with a noncardiac etiology such as GERD.  Catheterization last year showed catheter induced vasospasm but otherwise no significant CAD.  Caroline Conley has not experienced any improvement with omeprazole.  I wonder if she could have an element of pericarditis given her small pericardial effusion on recent CTA.  Her EKG does not allow for assessment of ST changes that could be seen with pericarditis due to chronic LBBB.  We will obtain an echocardiogram today.  I will also check a CBC, CMP, magnesium level, TSH, ESR, CRP, and ANA.  We will begin an empiric course of colchicine 0.6 mg twice daily, though this may need to be reduced to daily dosing if Caroline Conley develops GI symptoms.  Coronary vasospasm: Chest pain not consistent with angina.  Defer pharmacotherapy at this time.  COPD: Wheezing noted on examination today.  Question if COPD is contributing to her constellation of symptoms.  She was treated for exacerbation and possible pneumonia earlier this month.  Continue inhaled therapies per pulmonary.  Right radial artery occlusion: Pulse remains absent.  No further work-up, given lack of symptoms.  Follow-up: Return to clinic in 2-3 weeks.  Reproductive/Obstetrics                              Anesthesia Physical Anesthesia Plan  ASA: 3  Anesthesia Plan: General   Post-op Pain Management:    Induction: Intravenous  PONV Risk Score and Plan: 3 and Ondansetron, Dexamethasone and Treatment may vary due to age or medical condition  Airway Management Planned: LMA  Additional Equipment:   Intra-op Plan:   Post-operative Plan: Extubation in OR  Informed Consent: I have reviewed the patients History and Physical, chart, labs and discussed the procedure including the risks, benefits and alternatives for the proposed anesthesia with the patient or authorized representative who has indicated his/her understanding and acceptance.     Dental advisory given  Plan Discussed with: CRNA  Anesthesia Plan Comments: (Patient consented for risks of anesthesia including but not limited to:  - adverse reactions to medications - damage to eyes, teeth, lips or other oral mucosa - nerve damage due to positioning  - sore throat or hoarseness - damage to heart, brain, nerves, lungs, other parts of body or loss of life  Informed patient about role of CRNA in peri- and intra-operative care.  Patient voiced understanding.)        Anesthesia Quick Evaluation

## 2022-10-25 NOTE — Anesthesia Procedure Notes (Addendum)
Procedure Name: Intubation Date/Time: 10/25/2022 10:55 AM  Performed by: Tobie Poet, CRNAPre-anesthesia Checklist: Patient identified, Emergency Drugs available, Suction available and Patient being monitored Patient Re-evaluated:Patient Re-evaluated prior to induction Oxygen Delivery Method: Circle system utilized Preoxygenation: Pre-oxygenation with 100% oxygen Induction Type: IV induction Ventilation: Mask ventilation without difficulty Laryngoscope Size: McGraph and 3 Grade View: Grade I Tube type: Oral Tube size: 7.0 mm Number of attempts: 1 Airway Equipment and Method: Stylet and Oral airway Placement Confirmation: ETT inserted through vocal cords under direct vision, positive ETCO2 and breath sounds checked- equal and bilateral Secured at: 18 cm Tube secured with: Tape Dental Injury: Teeth and Oropharynx as per pre-operative assessment

## 2022-10-25 NOTE — Anesthesia Postprocedure Evaluation (Signed)
Anesthesia Post Note  Patient: Caroline Conley  Procedure(s) Performed: Right shoulder arthroscopic mini open rotator cuff repair, subacromial decompression, and distal clavicle excision (Right: Shoulder)  Patient location during evaluation: PACU Anesthesia Type: General Level of consciousness: awake and alert Pain management: pain level controlled Vital Signs Assessment: post-procedure vital signs reviewed and stable Respiratory status: spontaneous breathing, nonlabored ventilation, respiratory function stable and patient connected to nasal cannula oxygen Cardiovascular status: blood pressure returned to baseline and stable Postop Assessment: no apparent nausea or vomiting Anesthetic complications: no  No notable events documented.   Last Vitals:  Vitals:   10/25/22 1220 10/25/22 1230  BP: 112/68 120/74  Pulse: 97 87  Resp: 16 14  Temp: (!) 36.3 C   SpO2: 92% 93%    Last Pain:  Vitals:   10/25/22 1230  PainSc: 0-No pain                 Dimas Millin

## 2022-10-25 NOTE — Progress Notes (Signed)
Assisted Dr Sula Soda with right, supraclavicular, ultrasound guided block. Side rails up, monitors on throughout procedure. See vital signs in flow sheet. Tolerated Procedure well.

## 2022-10-28 ENCOUNTER — Emergency Department: Payer: 59

## 2022-10-28 ENCOUNTER — Encounter: Payer: Self-pay | Admitting: Orthopedic Surgery

## 2022-10-28 ENCOUNTER — Emergency Department
Admission: EM | Admit: 2022-10-28 | Discharge: 2022-10-29 | Discharge status: Against Medical Advice | Payer: 59 | Attending: Internal Medicine | Admitting: Internal Medicine

## 2022-10-28 DIAGNOSIS — Z7952 Long term (current) use of systemic steroids: Secondary | ICD-10-CM | POA: Diagnosis not present

## 2022-10-28 DIAGNOSIS — I110 Hypertensive heart disease with heart failure: Secondary | ICD-10-CM | POA: Insufficient documentation

## 2022-10-28 DIAGNOSIS — K529 Noninfective gastroenteritis and colitis, unspecified: Secondary | ICD-10-CM | POA: Insufficient documentation

## 2022-10-28 DIAGNOSIS — R0602 Shortness of breath: Secondary | ICD-10-CM | POA: Diagnosis not present

## 2022-10-28 DIAGNOSIS — Z7982 Long term (current) use of aspirin: Secondary | ICD-10-CM | POA: Diagnosis not present

## 2022-10-28 DIAGNOSIS — Z1152 Encounter for screening for COVID-19: Secondary | ICD-10-CM | POA: Insufficient documentation

## 2022-10-28 DIAGNOSIS — Z85828 Personal history of other malignant neoplasm of skin: Secondary | ICD-10-CM | POA: Diagnosis not present

## 2022-10-28 DIAGNOSIS — I5020 Unspecified systolic (congestive) heart failure: Secondary | ICD-10-CM | POA: Diagnosis not present

## 2022-10-28 DIAGNOSIS — J449 Chronic obstructive pulmonary disease, unspecified: Secondary | ICD-10-CM | POA: Insufficient documentation

## 2022-10-28 DIAGNOSIS — R112 Nausea with vomiting, unspecified: Secondary | ICD-10-CM | POA: Diagnosis not present

## 2022-10-28 DIAGNOSIS — I447 Left bundle-branch block, unspecified: Secondary | ICD-10-CM | POA: Diagnosis present

## 2022-10-28 DIAGNOSIS — R000 Tachycardia, unspecified: Secondary | ICD-10-CM | POA: Diagnosis not present

## 2022-10-28 DIAGNOSIS — Z87891 Personal history of nicotine dependence: Secondary | ICD-10-CM | POA: Insufficient documentation

## 2022-10-28 DIAGNOSIS — E785 Hyperlipidemia, unspecified: Secondary | ICD-10-CM | POA: Diagnosis present

## 2022-10-28 DIAGNOSIS — Z720 Tobacco use: Secondary | ICD-10-CM | POA: Diagnosis present

## 2022-10-28 DIAGNOSIS — I10 Essential (primary) hypertension: Secondary | ICD-10-CM | POA: Insufficient documentation

## 2022-10-28 LAB — COMPREHENSIVE METABOLIC PANEL
ALT: 30 U/L (ref 0–44)
AST: 77 U/L — ABNORMAL HIGH (ref 15–41)
Albumin: 3.5 g/dL (ref 3.5–5.0)
Alkaline Phosphatase: 150 U/L — ABNORMAL HIGH (ref 38–126)
Anion gap: 10 (ref 5–15)
BUN: 8 mg/dL (ref 6–20)
CO2: 24 mmol/L (ref 22–32)
Calcium: 9.5 mg/dL (ref 8.9–10.3)
Chloride: 104 mmol/L (ref 98–111)
Creatinine, Ser: 0.98 mg/dL (ref 0.44–1.00)
GFR, Estimated: >60 mL/min (ref >60)
Glucose, Bld: 129 mg/dL — ABNORMAL HIGH (ref 70–99)
Potassium: 3.6 mmol/L (ref 3.5–5.1)
Sodium: 138 mmol/L (ref 135–145)
Total Bilirubin: 0.8 mg/dL (ref 0.3–1.2)
Total Protein: 7.2 g/dL (ref 6.5–8.1)

## 2022-10-28 LAB — RESP PANEL BY RT-PCR (FLU A&B, COVID) ARPGX2
Influenza A by PCR: NEGATIVE
Influenza B by PCR: NEGATIVE
SARS Coronavirus 2 by RT PCR: POSITIVE — AB

## 2022-10-28 LAB — URINALYSIS, ROUTINE W REFLEX MICROSCOPIC
Bacteria, UA: NONE SEEN
Bilirubin Urine: NEGATIVE
Glucose, UA: NEGATIVE mg/dL
Ketones, ur: NEGATIVE mg/dL
Leukocytes,Ua: NEGATIVE
Nitrite: NEGATIVE
Protein, ur: NEGATIVE mg/dL
Specific Gravity, Urine: 1.008 (ref 1.005–1.030)
pH: 6 (ref 5.0–8.0)

## 2022-10-28 LAB — LIPASE, BLOOD: Lipase: 31 U/L (ref 11–51)

## 2022-10-28 LAB — CBC
HCT: 42.2 % (ref 36.0–46.0)
Hemoglobin: 15 g/dL (ref 12.0–15.0)
MCH: 34.4 pg — ABNORMAL HIGH (ref 26.0–34.0)
MCHC: 35.5 g/dL (ref 30.0–36.0)
MCV: 96.8 fL (ref 80.0–100.0)
Platelets: 277 10*3/uL (ref 150–400)
RBC: 4.36 MIL/uL (ref 3.87–5.11)
RDW: 11.9 % (ref 11.5–15.5)
WBC: 11.1 10*3/uL — ABNORMAL HIGH (ref 4.0–10.5)
nRBC: 0 % (ref 0.0–0.2)

## 2022-10-28 LAB — MAGNESIUM: Magnesium: 1.8 mg/dL (ref 1.7–2.4)

## 2022-10-28 LAB — PHOSPHORUS: Phosphorus: 2.7 mg/dL (ref 2.5–4.6)

## 2022-10-28 MED ORDER — LABETALOL HCL 5 MG/ML IV SOLN: 5.0000 mg | INTRAVENOUS | Status: DC | PRN

## 2022-10-28 MED ORDER — ACETAMINOPHEN 325 MG RE SUPP: 650.0000 mg | Freq: Four times a day (QID) | RECTAL | Status: DC | PRN

## 2022-10-28 MED ORDER — OXYCODONE HCL 5 MG PO TABS: 5.0000 mg | ORAL_TABLET | ORAL | Status: DC | PRN

## 2022-10-28 MED ORDER — IOHEXOL 350 MG/ML SOLN
75.0000 mL | Freq: Once | INTRAVENOUS | Status: AC | PRN
  Administered 2022-10-28: 75 mL via INTRAVENOUS

## 2022-10-28 MED ORDER — ONDANSETRON 4 MG PO TBDP
4.0000 mg | ORAL_TABLET | Freq: Once | ORAL | Status: AC | PRN
  Administered 2022-10-28: 4 mg via ORAL
  Filled 2022-10-28: qty 1

## 2022-10-28 MED ORDER — ACETAMINOPHEN 325 MG PO TABS: 650.0000 mg | ORAL_TABLET | Freq: Four times a day (QID) | ORAL | Status: DC | PRN

## 2022-10-28 MED ORDER — SODIUM CHLORIDE 0.9 % IV BOLUS
1000.0000 mL | Freq: Once | INTRAVENOUS | Status: AC
  Administered 2022-10-28: 1000 mL via INTRAVENOUS

## 2022-10-28 MED ORDER — DROPERIDOL 2.5 MG/ML IJ SOLN
2.5000 mg | Freq: Once | INTRAMUSCULAR | Status: AC
  Administered 2022-10-28: 2.5 mg via INTRAVENOUS
  Filled 2022-10-28: qty 2

## 2022-10-28 MED ORDER — SODIUM CHLORIDE 0.9 % IV SOLN
12.5000 mg | Freq: Once | INTRAVENOUS | Status: AC
  Administered 2022-10-28: 12.5 mg via INTRAVENOUS
  Filled 2022-10-28: qty 12.5

## 2022-10-28 MED ORDER — ONDANSETRON HCL 4 MG/2ML IJ SOLN: 4.0000 mg | Freq: Four times a day (QID) | INTRAMUSCULAR | Status: DC | PRN

## 2022-10-28 MED ORDER — BUDESON-GLYCOPYRROL-FORMOTEROL 160-9-4.8 MCG/ACT IN AERO: 2.0000 | INHALATION_SPRAY | RESPIRATORY_TRACT | Status: DC

## 2022-10-28 MED ORDER — OXYCODONE-ACETAMINOPHEN 10-325 MG PO TABS: 1.0000 | ORAL_TABLET | ORAL | Status: DC

## 2022-10-28 MED ORDER — SENNOSIDES-DOCUSATE SODIUM 8.6-50 MG PO TABS: 1.0000 | ORAL_TABLET | Freq: Every evening | ORAL | Status: DC | PRN

## 2022-10-28 MED ORDER — NICOTINE 21 MG/24HR TD PT24: 21.0000 mg | MEDICATED_PATCH | Freq: Every day | TRANSDERMAL | Status: DC | PRN

## 2022-10-28 MED ORDER — METOPROLOL SUCCINATE ER 25 MG PO TB24: 12.5000 mg | ORAL_TABLET | Freq: Every day | ORAL | Status: DC

## 2022-10-28 MED ORDER — ALBUTEROL SULFATE (2.5 MG/3ML) 0.083% IN NEBU: 2.5000 mg | INHALATION_SOLUTION | RESPIRATORY_TRACT | Status: DC | PRN

## 2022-10-28 MED ORDER — ASPIRIN 325 MG PO TBEC: 325.0000 mg | DELAYED_RELEASE_TABLET | Freq: Every day | ORAL | Status: DC

## 2022-10-28 MED ORDER — ENOXAPARIN SODIUM 40 MG/0.4ML IJ SOSY: 40.0000 mg | PREFILLED_SYRINGE | INTRAMUSCULAR | Status: DC

## 2022-10-28 MED ORDER — SODIUM CHLORIDE 0.9 % IV SOLN
12.5000 mg | Freq: Four times a day (QID) | INTRAVENOUS | Status: DC | PRN
  Filled 2022-10-28: qty 0.5

## 2022-10-28 MED ORDER — ONDANSETRON HCL 4 MG/2ML IJ SOLN
4.0000 mg | Freq: Once | INTRAMUSCULAR | Status: AC
  Administered 2022-10-28: 4 mg via INTRAVENOUS
  Filled 2022-10-28: qty 2

## 2022-10-28 MED ORDER — METOPROLOL TARTRATE 5 MG/5ML IV SOLN: 5.0000 mg | INTRAVENOUS | Status: DC | PRN

## 2022-10-28 MED ORDER — ONDANSETRON HCL 4 MG PO TABS: 4.0000 mg | ORAL_TABLET | Freq: Four times a day (QID) | ORAL | Status: DC | PRN

## 2022-10-28 MED ORDER — PANTOPRAZOLE SODIUM 40 MG PO TBEC: 80.0000 mg | DELAYED_RELEASE_TABLET | Freq: Every day | ORAL | Status: DC

## 2022-10-28 NOTE — Assessment & Plan Note (Addendum)
-   COVID/influenza A/influenza B PCR pending - 20 respiratory pathogen panel ordered - Check procalcitonin - Symptomatic support

## 2022-10-28 NOTE — Assessment & Plan Note (Signed)
-   Metoprolol succinate 12.5 mg p.o. daily resumed - Labetalol 5 mg IV every 2 hours as needed for SBP greater than 175, 3 doses ordered

## 2022-10-28 NOTE — ED Triage Notes (Signed)
Pt sts that she had surgery on her right shoulder on Friday. Pt sts that she has been vomiting since yesterday and not able to keep anything down.

## 2022-10-28 NOTE — ED Provider Notes (Signed)
[facility] Provider Note   Event Date/Time   First MD Initiated Contact with Patient 10/28/22 1854     (approximate) History  Vomiting  HPI Caroline Conley is a 56 y.o. female with stated past medical history of tobacco abuse, emphysema, heart failure, and recurrent right shoulder pain with recent surgery 3 days prior to arrival who presents complaining of nausea/vomiting since the surgery.  Patient states that she had postoperative nausea/vomiting that has not improved over the last 3 days after her surgery.  Patient has not tried any medications for this nausea.  Patient denies any abdominal pain or significant right shoulder pain at this time. ROS: Patient currently denies any vision changes, tinnitus, difficulty speaking, facial droop, sore throat, chest pain, shortness of breath, diarrhea, dysuria, or weakness/numbness/paresthesias in any extremity   Physical Exam  Triage Vital Signs: ED Triage Vitals  Enc Vitals Group     BP 10/28/22 1841 (!) 144/112     Pulse Rate 10/28/22 1841 (!) 160     Resp 10/28/22 1841 20     Temp 10/28/22 1841 99.9 F (37.7 C)     Temp Source 10/28/22 1841 Oral     SpO2 10/28/22 1841 98 %     Weight 10/28/22 1842 130 lb (59 kg)     Height --      Head Circumference --      Peak Flow --      Pain Score 10/28/22 1842 8     Pain Loc --      Pain Edu? --      Excl. in [place]? --    Most recent vital signs: Vitals:   10/28/22 2130 10/28/22 2329  BP: (!) 138/91 120/87  Pulse: (!) 123 85  Resp: (!) 22 18  Temp:  99.1 F (37.3 C)  SpO2: 95% 96%   General: Awake, oriented x4. CV:  Good peripheral perfusion.  Resp:  Normal effort.  Abd:  No distention.  Other:  Middle-aged Caucasian female laying in bed in no acute distress.  Surgical dressing in place to right shoulder with splint in place as well without any evidence of surrounding erythema ED Results / Procedures / Treatments  Labs (all labs ordered are listed, but only  abnormal results are displayed) Labs Reviewed  RESP PANEL BY RT-PCR (FLU A&B, COVID) ARPGX2 - Abnormal; Notable for the following components:      Result Value   SARS Coronavirus 2 by RT PCR POSITIVE (*)    All other components within normal limits  COMPREHENSIVE METABOLIC PANEL - Abnormal; Notable for the following components:   Glucose, Bld 129 (*)    AST 77 (*)    Alkaline Phosphatase 150 (*)    All other components within normal limits  CBC - Abnormal; Notable for the following components:   WBC 11.1 (*)    MCH 34.4 (*)    All other components within normal limits  URINALYSIS, ROUTINE W REFLEX MICROSCOPIC - Abnormal; Notable for the following components:   Color, Urine STRAW (*)    APPearance CLEAR (*)    Hgb urine dipstick MODERATE (*)    All other components within normal limits  RESPIRATORY PANEL BY PCR  LIPASE, BLOOD  PHOSPHORUS  MAGNESIUM  BASIC METABOLIC PANEL  CBC  PROCALCITONIN  HIV ANTIBODY (ROUTINE TESTING W REFLEX)   EKG ED ECG REPORT I, Naaman Plummer, the attending physician, personally viewed and interpreted this ECG. Date: 10/28/2022 EKG Time: 1839 Rate: 155  Rhythm: Tachycardic sinus rhythm QRS Axis: normal Intervals: normal ST/T Wave abnormalities: normal Narrative Interpretation: Tachycardic sinus rhythm.  No evidence of acute ischemia RADIOLOGY ED MD interpretation: CT angiography of the chest interpreted by me and does not show any evidence of pulmonary embolism or other acute intrathoracic abnormalities.  There is incidentally found moderate centrilobular emphysema -Agree with radiology assessment Official radiology report(s): CT Angio Chest PE W/Cm &/Or Wo Cm  Result Date: 10/28/2022 CLINICAL DATA:  Right shoulder surgery on Friday. Vomiting pulmonary embolism suspected EXAM: CT ANGIOGRAPHY CHEST WITH CONTRAST TECHNIQUE: Multidetector CT imaging of the chest was performed using the standard protocol during bolus administration of intravenous  contrast. Multiplanar CT image reconstructions and MIPs were obtained to evaluate the vascular anatomy. RADIATION DOSE REDUCTION: This exam was performed according to the departmental dose-optimization program which includes automated exposure control, adjustment of the mA and/or kV according to patient size and/or use of iterative reconstruction technique. CONTRAST:  30m OMNIPAQUE IOHEXOL 350 MG/ML SOLN COMPARISON:  CT examination dated March 25, 2022 FINDINGS: Cardiovascular: Satisfactory opacification of the pulmonary arteries to the segmental level. No evidence of pulmonary embolism. Normal heart size. No pericardial effusion. Mediastinum/Nodes: No enlarged mediastinal, hilar, or axillary lymph nodes. Thyroid gland, trachea, and esophagus demonstrate no significant findings. Lungs/Pleura: Moderate centrilobular emphysematous changes. No evidence of pneumonia or pulmonary edema. No pleural effusion. No suspicious pulmonary nodule. Upper Abdomen: Status post cholecystectomy.  No acute abnormality. Musculoskeletal: No chest wall abnormality. No acute or significant osseous findings. Surgical hardware projecting over the right humeral head. Review of the MIP images confirms the above findings. IMPRESSION: 1. No evidence of pulmonary embolism or other acute intrathoracic abnormality. 2. Moderate centrilobular emphysematous changes. Electronically Signed   By: IKeane PoliceD.O.   On: 10/28/2022 22:29   PROCEDURES: Critical Care performed: No .1-3 Lead EKG Interpretation  Performed by: BNaaman Plummer MD Authorized by: BNaaman Plummer MD     Interpretation: abnormal     ECG rate:  135   ECG rate assessment: tachycardic     Rhythm: sinus tachycardia     Ectopy: none     Conduction: normal    MEDICATIONS ORDERED IN ED: Medications  aspirin EC tablet 325 mg (has no administration in time range)  oxyCODONE (Oxy IR/ROXICODONE) immediate release tablet 5-10 mg (has no administration in time range)   metoprolol succinate (TOPROL-XL) 24 hr tablet 12.5 mg (has no administration in time range)  pantoprazole (PROTONIX) EC tablet 80 mg (has no administration in time range)  albuterol (PROVENTIL) (2.5 MG/3ML) 0.083% nebulizer solution 2.5 mg (has no administration in time range)  Budeson-Glycopyrrol-Formoterol 160-9-4.8 MCG/ACT AERO 2 puff (has no administration in time range)  acetaminophen (TYLENOL) tablet 650 mg (has no administration in time range)    Or  acetaminophen (TYLENOL) suppository 650 mg (has no administration in time range)  ondansetron (ZOFRAN) tablet 4 mg (has no administration in time range)    Or  ondansetron (ZOFRAN) injection 4 mg (has no administration in time range)  enoxaparin (LOVENOX) injection 40 mg (has no administration in time range)  senna-docusate (Senokot-S) tablet 1 tablet (has no administration in time range)  promethazine (PHENERGAN) 12.5 mg in sodium chloride 0.9 % 50 mL IVPB (has no administration in time range)  metoprolol tartrate (LOPRESSOR) injection 5 mg (has no administration in time range)  labetalol (NORMODYNE) injection 5 mg (has no administration in time range)  nicotine (NICODERM CQ - dosed in mg/24 hours) patch 21 mg (has no  administration in time range)  ondansetron (ZOFRAN-ODT) disintegrating tablet 4 mg (4 mg Oral Given 10/28/22 1849)  sodium chloride 0.9 % bolus 1,000 mL (0 mLs Intravenous Stopped 10/28/22 1950)  ondansetron (ZOFRAN) injection 4 mg (4 mg Intravenous Given 10/28/22 1909)  sodium chloride 0.9 % bolus 1,000 mL (0 mLs Intravenous Stopped 10/28/22 2041)  promethazine (PHENERGAN) 12.5 mg in sodium chloride 0.9 % 50 mL IVPB (0 mg Intravenous Stopped 10/28/22 2056)  droperidol (INAPSINE) 2.5 MG/ML injection 2.5 mg (2.5 mg Intravenous Given 10/28/22 2225)  sodium chloride 0.9 % bolus 1,000 mL (0 mLs Intravenous Stopped 10/28/22 2331)  iohexol (OMNIPAQUE) 350 MG/ML injection 75 mL (75 mLs Intravenous Contrast Given 10/28/22 2216)    IMPRESSION / MDM / ASSESSMENT AND PLAN / ED COURSE  I reviewed the triage vital signs and the nursing notes.                             The patient is on the cardiac monitor to evaluate for evidence of arrhythmia and/or significant heart rate changes. Patient's presentation is most consistent with acute presentation with potential threat to life or bodily function. Patient presents for acute nausea/vomiting The cause of the patients symptoms is not clear  Given History and Exam there does not appear to be an emergent cause of the symptoms such as small bowel obstruction, coronary syndrome, bowel ischemia, DKA, pancreatitis, appendicitis, other acute abdomen or other emergent problem.  Reassessment: After treatment, the patient is continuing to vomit as well as showing signs of persistent tachycardia despite fluid resuscitation.  Disposition: Patient's only moderate response to therapy and persistent tachycardia and nausea/vomiting, patient will require admission to the internal medicine service for further evaluation and management.   FINAL CLINICAL IMPRESSION(S) / ED DIAGNOSES   Final diagnoses:  Intractable nausea and vomiting  Sinus tachycardia   Rx / DC Orders   ED Discharge Orders     None      Note:  This document was prepared using Dragon voice recognition software and may include unintentional dictation errors.   Naaman Plummer, MD 10/28/22 [phone]

## 2022-10-28 NOTE — ED Notes (Signed)
Patient transported from ct.

## 2022-10-28 NOTE — ED Notes (Signed)
ED Provider at bedside. 

## 2022-10-28 NOTE — Hospital Course (Signed)
[name] is a 56 year old female with history of COPD, left bundle branch block, nonischemic cardiomyopathy, GERD, gastritis, history of right subacromial impingement, right rotator cuff repair, status post arthroscopic rotator cuff repair, mini open rotator cuff repair on 10/25/2022.  She presents emergency department for 3 days, postop, of intractable nausea and vomiting.  Patient states the ondansetron did not help her at home.  Initial vitals in the emergency department showed temperature 99.9, respiration rate 20, heart rate of 160, blood pressure 144/112, SPO2 of 98% on room air.  Serum sodium is 138, potassium 3.6, chloride 104, bicarb 24, BUN of 8, serum creatinine 0.  9 8, nonfasting blood glucose 129, GFR greater than 60, WBC 11.1, platelets of 277, hemoglobin 15.  CTA for PE was negative for PE.  ED treatment: Ondansetron, Phenergan

## 2022-10-28 NOTE — H&P (Signed)
History and Physical   Caroline Conley:[id number] DOB: [birth year] DOA: 10/28/2022  PCP: Olin Hauser, DO ( Outpatient Specialists: Dr. Posey Pronto, orthopedic surgery Patient coming from: Home  I have personally briefly reviewed patient's old medical records in [place].  Chief Concern: Intractable nausea and vomiting  HPI: [name] is a 56 year old female with history of COPD, left bundle branch block, nonischemic cardiomyopathy, GERD, gastritis, history of right subacromial impingement, right rotator cuff repair, status post arthroscopic rotator cuff repair, mini open rotator cuff repair on 10/25/2022.  She presents emergency department for 3 days, postop, of intractable nausea and vomiting.  Patient states the ondansetron did not help her at home.  Initial vitals in the emergency department showed temperature 99.9, respiration rate 20, heart rate of 160, blood pressure 144/112, SPO2 of 98% on room air.  Serum sodium is 138, potassium 3.6, chloride 104, bicarb 24, BUN of 8, serum creatinine 0.  9 8, nonfasting blood glucose 129, GFR greater than 60, WBC 11.1, platelets of 277, hemoglobin 15.  CTA for PE was negative for PE.  ED treatment: Ondansetron, Phenergan  At bedside patient was able to tell me her name, her age, she knows she is in the hospital and she knows the current calendar year.  She reports that she has been nauseous and vomiting continuously for 3 days.  She has not been able to keep any food down and has not been able to keep her medications down.  She denies known fever at home, chest pain, shortness of breath, abdominal pain, diarrhea, swelling of her lower extremities.  She reports that her daughter and 2 granddaughters, whom she lives with have all had the same symptoms she had this past week.  She denies changes to diet.  Social history: She lives with her daughter and 2 granddaughters.  She denies tobacco, EtOH and recreational drug use.   She is retired and formerly had a Biomedical engineer run/business.  ROS: Constitutional: no weight change, no fever ENT/Mouth: no sore throat, no rhinorrhea Eyes: no eye pain, no vision changes Cardiovascular: no chest pain, no dyspnea,  no edema, no palpitations Respiratory: no cough, no sputum, no wheezing Gastrointestinal: + nausea, + vomiting, no diarrhea, no constipation Genitourinary: no urinary incontinence, no dysuria, no hematuria Musculoskeletal: no arthralgias, no myalgias Skin: no skin lesions, no pruritus, Neuro: + weakness, no loss of consciousness, no syncope Psych: no anxiety, no depression, + decrease appetite Heme/Lymph: no bruising, no bleeding  ED Course: Discussed with emergency medicine provider, patient requiring hospitalization for chief concerns of intractable nausea and vomiting.  Assessment/Plan  Principal Problem:   Intractable nausea and vomiting Active Problems:   Tobacco abuse   Hyperlipidemia   Tachycardia   SOB (shortness of breath)   LBBB (left bundle branch block)   Gastroenteritis   Essential hypertension   Assessment and Plan:  * Intractable nausea and vomiting - COVID/influenza A/influenza B PCR pending - Check 20 pathogen respiratory panel - Check procalcitonin - Ondansetron every 6 hours as needed for nausea and vomiting; Phenergan 12.5 mg IV every 6 hours.  For refractory nausea and vomiting, 3 doses ordered - Patient is status post sodium chloride 3 L bolus per EDP - Further IV fluid has not been ordered by myself due to complete echo on 02/07/22 revealing estimated ejection fraction 35 to 40% - Telemetry medical, observation  Essential hypertension - Metoprolol succinate 12.5 mg p.o. daily resumed - Labetalol 5 mg IV every 2 hours as  needed for SBP greater than 175, 3 doses ordered  Gastroenteritis - COVID/influenza A/influenza B PCR pending - 20 respiratory pathogen panel ordered - Check procalcitonin - Symptomatic  support  Tobacco abuse - PRN nicotine patch ordered  Patient's home medication of metoprolol succinate 12.5 mg p.o. daily have been resumed  Patient chart has a prior diagnosis of tachycardia, I have reviewed patient's cardiology note on 09/27/2014 from [facility] cardiology and there is no related tachycardia diagnosis.  Patient is diagnosed with mild CAD and has been prescribed metoprolol tartrate 25 mg p.o. twice daily and isosorbide mononitrate 30 mg daily, aspirin 81 mg daily based on this visit  Sinus tachycardia-presumed secondary to patient having gastroenteritis and not able to keep medications in her system - Resumed home metoprolol succinate 12.5 mg daily - Metoprolol tartrate 5 mg IV every 2 hours as needed for heart rate greater than 120  Chart reviewed.   DVT prophylaxis: Enoxaparin Code Status: Full code Diet: Heart healthy Family Communication: no Disposition Plan: Pending clinical course Consults called: None at this time Admission status: Telemetry medical, observation  Past Medical History:  Diagnosis Date   Atypical chest pain    a. 2015 Cath ([place]): Nl cors. Nl EF; b. 10/2018 MV: No ischemia/infarct. EF>65%.   COPD (chronic obstructive pulmonary disease) (HCC)    Coronary vasospasm ([place])    a. 06/2021 Cath: LM nl, LADnl, LCX large/nl, RCA 35p (spasm-->tx w/ IC NTG w/ resultant VF req defib).   Dysplastic nevus 02/02/2015   R upper back - excision   Dysplastic nevus 02/16/2015   L hip - excision   Dysplastic nevus 08/24/2015   L upper abdomen   Dysplastic nevus 08/11/2017   mid abdomen - excised   Dysplastic nevus 07/29/2017   Left neck   Full dentures    Gastritis    GERD (gastroesophageal reflux disease)    HFrEF (heart failure with reduced ejection fraction) ([place])    Hyperlipidemia    Iatrogenic ventricular fibrillation 07/02/2021   a.) during [place] --> coronary vasospasm Tx'd with IC NTG resulting in VF requiring defibrillation; no  recurrence.   LBBB (left bundle branch block)    Melanoma ([place]) 08/24/2015   L buttocks - Breslow depth 0.38m, Ulceration: absent, Mitotic index: 1 per HPF, TILs: Non-brisk, SLNbx: negative   NICM (nonischemic cardiomyopathy) ([place]    a.) 08/04/2018: EF 40%; ant/anterosepal HK, GLS -13.7; mild-mod MR; G1DD.  b.) 06/22/2021 TTE: EF 40%, ant/antseptal/septal HK. G1DD. Nl RV fxn. Mild-mod MR; c.) 06/2021 LHC: Nl cors w/ RCA vasospasm (35%). VF w/ IC NTG admin req defib; d.) 07/02/2021 TTE: EF 35-40%. Glob HK. Mild MR. e.) TTE 02/07/2022: EF 35-40%; basal mid-anteroseptal AK; mild MR; G2DD.   Occlusion of right radial artery ([place]    a. 06/2021 s/p cardiac cath-->u/s w/ R radial occlusion from puncture extending up entire forearm-->Lovenox '1mg'$ /kg BID x 4 wks; b. 07/2021 U/S: Thrombus in prox Radial artery w/ minimal reconstitution of flow - improved w/ multiphasic waveforms in R brachial artery.   Palpitations    a. 07/2018 48h Holter: predominantly RSR, avg rate 106 (69-166 bpm), Rare PAC's and occas PVC's. 2 atrial runs up to 5 beats. No sustained arrhythmias or prolonged pauses. No symptoms reported; b. 07/2021 Zio: Avg HR 102 bpm, intermittent bundle branch block, rare PACs and PVCs, 9 atrial runs, longest 13.7 secs w/ max HR of 226 bpm.   Skin cancer, basal cell 01/2016   Wears dentures    full upper and  lower   Past Surgical History:  Procedure Laterality Date   ABDOMINAL HYSTERECTOMY     APPENDECTOMY     BACK SURGERY     repeat lumbar herniated disc; apply cage   BUNIONECTOMY Right    CARDIAC CATHETERIZATION  09/20/2014   [facility] - Dr. Clayborn Bigness   CHOLECYSTECTOMY     COLONOSCOPY WITH PROPOFOL N/A 07/08/2016   Procedure: COLONOSCOPY WITH PROPOFOL;  Surgeon: Lucilla Lame, MD;  Location: [place];  Service: Endoscopy;  Laterality: N/A;   DEBRIDEMENT TENNIS ELBOW     ESOPHAGOGASTRODUODENOSCOPY  09/16/2014   HARDWARE REMOVAL Left 01/04/2022   Procedure: HARDWARE REMOVAL;  Surgeon: Sharlotte Alamo, DPM;  Location: [facility];  Service: Podiatry;  Laterality: Left;   INGUINAL HERNIA REPAIR Right 04/17/2022   Procedure: HERNIA REPAIR INGUINAL ADULT;  Surgeon: Robert Bellow, MD;  Location: [facility];  Service: General;  Laterality: Right;  open   LEFT HEART CATH AND CORONARY ANGIOGRAPHY N/A 07/02/2021   Procedure: LEFT HEART CATH AND CORONARY ANGIOGRAPHY;  Surgeon: [name], MD;  Location: [place] [facility];  Service: Cardiovascular;  Laterality: N/A;   LUMBAR DISC SURGERY     herniated disc   LUMBAR DISC SURGERY     took screws out from previous lumbar disc surgery   METATARSAL OSTEOTOMY  05/18/2013   ROTATOR CUFF REPAIR Right    SHOULDER ARTHROSCOPY Right 10/25/2022   Procedure: Right shoulder arthroscopic mini open rotator cuff repair, subacromial decompression, and distal clavicle excision;  Surgeon: Leim Fabry, MD;  Location: [place];  Service: Orthopedics;  Laterality: Right;   TONSILLECTOMY     Social History:  reports that she quit smoking about 4 years ago. Her smoking use included cigarettes. She has a 30.00 pack-year smoking history. She has never used smokeless tobacco. She reports current drug use. Drug: Oxycodone. She reports that she does not drink alcohol.  Allergies  Allergen Reactions   2,4-D Dimethylamine Itching    (Pt unsure of this allergy) Herbacide? Patient denies having this allergy and states she has no idea why it was added to her chart.    Vicodin [Hydrocodone-Acetaminophen] Nausea And Vomiting    GI distress   Morphine And Related Itching   Family History  Problem Relation Age of Onset   Diabetes Mother    High blood pressure Mother    Gout Mother    Cancer Father    Stroke Father    Arthritis/Rheumatoid Father    Diabetes Father    High blood pressure Father    Heart failure Father    Heart failure Brother    Diabetes Brother    Family history: Family history reviewed and not pertinent  Prior to Admission  medications   Medication Sig Start Date End Date Taking? Authorizing Provider  aspirin EC 325 MG tablet Take 1 tablet (325 mg total) by mouth daily for 14 days. 10/25/22 11/08/22 Yes Leim Fabry, MD  Budeson-Glycopyrrol-Formoterol (BREZTRI AEROSPHERE) 160-9-4.8 MCG/ACT AERO Inhale 2 puffs into the lungs in the morning and at bedtime. 09/26/22  Yes Flora Lipps, MD  magic mouthwash w/lidocaine SOLN Swish, gargle, and spit one to two teaspoonfuls every six hours as needed. Shake well before using. 08/20/22  Yes Karamalegos, Devonne Doughty, DO  metoprolol succinate (TOPROL-XL) 25 MG 24 hr tablet Take 0.5 tablets (12.5 mg total) by mouth daily. 01/17/22 01/12/23 Yes End, Harrell Gave, MD  omeprazole (PRILOSEC) 40 MG capsule Take 1 capsule (40 mg total) by mouth daily before breakfast. 06/27/22  Yes Karamalegos, Devonne Doughty, DO  oxyCODONE (ROXICODONE) 5 MG immediate release tablet Take 1-2 tablets (5-10 mg total) by mouth every 4 (four) hours as needed (pain). 10/25/22 10/25/23 Yes Leim Fabry, MD  oxyCODONE-acetaminophen (PERCOCET) 10-325 MG tablet Take 1 tablet by mouth every 4 (four) hours.   Yes [provider]  promethazine (PHENERGAN) 25 MG tablet TAKE 1/2-1 TABLET BY MOUTH EVERY 8 HOURSAS NEEDED FOR NAUSEA OR VOMITING 10/22/22  Yes Karamalegos, Devonne Doughty, DO  roflumilast (DALIRESP) 500 MCG TABS tablet Take 1 tablet (500 mcg total) by mouth daily. 09/26/22  Yes Kasa, Maretta Bees, MD  TRELEGY ELLIPTA 100-62.5-25 MCG/ACT AEPB Inhale 1 puff into the lungs daily. 08/20/22  Yes Karamalegos, Devonne Doughty, DO  acetaminophen (TYLENOL) 500 MG tablet Take 2 tablets (1,000 mg total) by mouth every 8 (eight) hours. 10/25/22 10/25/23  Leim Fabry, MD  albuterol [facility] HFA) [phone] Base) MCG/ACT inhaler Inhale 2 puffs into the lungs every 4 (four) hours as needed for wheezing or shortness of breath. 09/26/22   Flora Lipps, MD  albuterol (PROVENTIL) (2.5 MG/3ML) 0.083% nebulizer solution Take 3 mLs (2.5 mg total) by  nebulization every 4 (four) hours as needed for shortness of breath or wheezing. 09/26/22   Flora Lipps, MD  chlorpheniramine-HYDROcodone (TUSSIONEX) 10-8 MG/5ML Take 5 mLs by mouth every 12 (twelve) hours as needed for cough. Patient not taking: Reported on 10/21/2022 08/20/22   Olin Hauser, DO  fluconazole (DIFLUCAN) 150 MG tablet TAKE 1 TABLET BY MOUTH ON DAY 1 . REPEAT2ND DOSE ON DAY 3 10/22/22   Parks Ranger, Devonne Doughty, DO  Misc. Devices (PULSE OXIMETER FOR FINGER) MISC 1 Device by Does not apply route as needed (for cough, dyspnea). Pulse oximeter for finger for checking oxygen saturation in COPD 11/24/19   Karamalegos, Devonne Doughty, DO  ondansetron (ZOFRAN-ODT) 4 MG disintegrating tablet Take 1 tablet (4 mg total) by mouth every 8 (eight) hours as needed for nausea or vomiting. 10/25/22   Leim Fabry, MD  silver sulfADIAZINE (SILVADENE) 1 % cream Apply 1 Application topically daily. For 1-2 weeks then as needed Patient not taking: Reported on 10/28/2022 06/27/22   Olin Hauser, DO   Physical Exam: Vitals:   10/28/22 2012 10/28/22 2030 10/28/22 2100 10/28/22 2130  BP:  (!) 142/83 (!) 141/91 (!) 138/91  Pulse: (!) 119 (!) 111 (!) 118 (!) 123  Resp: '13 13 17 '$ (!) 22  Temp:      TempSrc:      SpO2: 97% 95% 95% 95%  Weight:       Constitutional: appears older than chronological age, NAD, calm, comfortable Eyes: PERRL, lids and conjunctivae normal ENMT: Mucous membranes are moist. Posterior pharynx clear of any exudate or lesions. Age-appropriate dentition. Hearing appropriate Neck: normal, supple, no masses, no thyromegaly Respiratory: clear to auscultation bilaterally, no wheezing, no crackles. Normal respiratory effort. No accessory muscle use.  Cardiovascular: Regular rate and rhythm, no murmurs / rubs / gallops. No extremity edema. 2+ pedal pulses. No carotid bruits.  Abdomen: no tenderness, no masses palpated, no hepatosplenomegaly. Bowel sounds positive.   Musculoskeletal: no clubbing / cyanosis. No joint deformity upper and lower extremities. No contractures, no atrophy. Normal muscle tone.  Decreased range of motion in the right upper extremity as it is in the sling Skin: no rashes, lesions, ulcers. No induration Neurologic: Sensation intact. Strength 5/5 in all 4.  Psychiatric: Normal judgment and insight. Alert and oriented x 3. Normal mood.   EKG: independently reviewed, showing sinus tachycardia  with rate of 155, QTc 417  Chest x-ray on Admission: I personally reviewed and I agree with radiologist reading as below.  CT Angio Chest PE W/Cm &/Or Wo Cm  Result Date: 10/28/2022 CLINICAL DATA:  Right shoulder surgery on Friday. Vomiting pulmonary embolism suspected EXAM: CT ANGIOGRAPHY CHEST WITH CONTRAST TECHNIQUE: Multidetector CT imaging of the chest was performed using the standard protocol during bolus administration of intravenous contrast. Multiplanar CT image reconstructions and MIPs were obtained to evaluate the vascular anatomy. RADIATION DOSE REDUCTION: This exam was performed according to the departmental dose-optimization program which includes automated exposure control, adjustment of the mA and/or kV according to patient size and/or use of iterative reconstruction technique. CONTRAST:  17m OMNIPAQUE IOHEXOL 350 MG/ML SOLN COMPARISON:  CT examination dated March 25, 2022 FINDINGS: Cardiovascular: Satisfactory opacification of the pulmonary arteries to the segmental level. No evidence of pulmonary embolism. Normal heart size. No pericardial effusion. Mediastinum/Nodes: No enlarged mediastinal, hilar, or axillary lymph nodes. Thyroid gland, trachea, and esophagus demonstrate no significant findings. Lungs/Pleura: Moderate centrilobular emphysematous changes. No evidence of pneumonia or pulmonary edema. No pleural effusion. No suspicious pulmonary nodule. Upper Abdomen: Status post cholecystectomy.  No acute abnormality. Musculoskeletal: No  chest wall abnormality. No acute or significant osseous findings. Surgical hardware projecting over the right humeral head. Review of the MIP images confirms the above findings. IMPRESSION: 1. No evidence of pulmonary embolism or other acute intrathoracic abnormality. 2. Moderate centrilobular emphysematous changes. Electronically Signed   By: IKeane PoliceD.O.   On: 10/28/2022 22:29    Labs on Admission: I have personally reviewed following labs  CBC: Recent Labs  Lab 10/28/22 1844  WBC 11.1*  HGB 15.0  HCT 42.2  MCV 96.8  PLT 2878  Basic Metabolic Panel: Recent Labs  Lab 10/28/22 1844  NA 138  K 3.6  CL 104  CO2 24  GLUCOSE 129*  BUN 8  CREATININE 0.98  CALCIUM 9.5   GFR: Estimated Creatinine Clearance: 59.7 mL/min (by C-G formula based on SCr of 0.98 mg/dL).  Liver Function Tests: Recent Labs  Lab 10/28/22 1844  AST 77*  ALT 30  ALKPHOS 150*  BILITOT 0.8  PROT 7.2  ALBUMIN 3.5   Recent Labs  Lab 10/28/22 1844  LIPASE 31   Urine analysis:    Component Value Date/Time   COLORURINE STRAW (A) 10/28/2022 2003   APPEARANCEUR CLEAR (A) 10/28/2022 2003   LABSPEC 1.008 10/28/2022 2003   PHURINE 6.0 10/28/2022 2003   GLUCOSEU NEGATIVE 10/28/2022 2003   HGBUR MODERATE (A) 10/28/2022 2003   BILIRUBINUR NEGATIVE 10/28/2022 2003   KETONESUR NEGATIVE 10/28/2022 2003   PROTEINUR NEGATIVE 10/28/2022 2003   UROBILINOGEN 0.2 10/17/2011 1212   NITRITE NEGATIVE 10/28/2022 2003   LEUKOCYTESUR NEGATIVE 10/28/2022 2003   Dr. CTobie PoetTriad Hospitalists  If 7PM-7AM, please contact overnight-coverage provider If 7AM-7PM, please contact day coverage provider [email or web address]  10/28/2022, 11:02 PM

## 2022-10-28 NOTE — Assessment & Plan Note (Addendum)
-   PRN nicotine patch ordered

## 2022-10-28 NOTE — Assessment & Plan Note (Addendum)
-   COVID/influenza A/influenza B PCR pending - Check 20 pathogen respiratory panel - Check procalcitonin - Ondansetron every 6 hours as needed for nausea and vomiting; Phenergan 12.5 mg IV every 6 hours.  For refractory nausea and vomiting, 3 doses ordered - Patient is status post sodium chloride 3 L bolus per EDP - Further IV fluid has not been ordered by myself due to complete echo on 02/07/22 revealing estimated ejection fraction 35 to 40% - Telemetry medical, observation

## 2022-10-28 NOTE — ED Notes (Signed)
Patient transported to CT 

## 2022-10-29 LAB — PROCALCITONIN: Procalcitonin: <0.1 ng/mL

## 2022-10-29 NOTE — ED Notes (Signed)
K.FOUST, NP, IN ROOM TO SPEAK WITH PT ON THE RISK SHE IS TAKING BY LEAVING AGAINST MEDICAL ADVICE. ASSIGNED RN PROVIDED WITH AMA FORM.

## 2022-10-29 NOTE — ED Notes (Signed)
Pt refuses to wait for NP, vital signs taken before pt left. Vital signs stable

## 2022-10-29 NOTE — ED Notes (Signed)
Pt states she wants to leave AMA. Neomia Glass NP made aware. Pt refuses pain medication.

## 2022-10-29 NOTE — Progress Notes (Addendum)
         CROSS COVER NOTE  NAME: Caroline Conley MRN: [id number] DOB : [birth year] ATTENDING PHYSICIAN: Cox, Amy N, DO                                                                                 Against Medical Advice   Patient at this time expresses desire to leave the Hospital immediately citing the bed is uncomfortable, patient has been warned that this is not Medically advisable at this time, and can result in Medical complications like Death and Disability. Patient located in ED 35 and I specifically discussed COVID(+) status and need for monitoring to ensure resolution/improvement of intractable nausea and vomiting. Patient has full decision making capacity and understands and accepts the risks involved and assumes full responsibilty of this decision.  This patient has also been advised that if they feel the need for further medical assistance to return to the closest ER or dial 9-1-1.   This document was prepared using Dragon voice recognition software and may include unintentional dictation errors.  Neomia Glass DNP, MBA, FNP-BC Nurse Practitioner [facility] Pager [phone]

## 2022-11-01 ENCOUNTER — Telehealth: Payer: Self-pay | Admitting: Internal Medicine

## 2022-11-01 NOTE — Telephone Encounter (Signed)
Spoke with Caroline Conley to inform provider switch approved, Caroline Conley will f/u with Dr. Rockey Situ after her appt with [facility] 11/26/22.

## 2022-11-01 NOTE — Telephone Encounter (Signed)
Pt requesting a switch from Dr. Saunders Revel to Dr. Rockey Situ, is this okay with you all?

## 2022-11-01 NOTE — Telephone Encounter (Signed)
That is fine with me.

## 2022-11-06 ENCOUNTER — Ambulatory Visit (INDEPENDENT_AMBULATORY_CARE_PROVIDER_SITE_OTHER): Payer: 59 | Admitting: Family Medicine

## 2022-11-06 ENCOUNTER — Encounter: Payer: Self-pay | Admitting: Family Medicine

## 2022-11-06 VITALS — BP 144/88 | HR 115 | Ht 66.0 in | Wt 130.0 lb

## 2022-11-06 DIAGNOSIS — M25511 Pain in right shoulder: Secondary | ICD-10-CM

## 2022-11-06 DIAGNOSIS — Z9889 Other specified postprocedural states: Secondary | ICD-10-CM

## 2022-11-06 DIAGNOSIS — J441 Chronic obstructive pulmonary disease with (acute) exacerbation: Secondary | ICD-10-CM

## 2022-11-06 MED ORDER — HYDROCOD POLI-CHLORPHE POLI ER 10-8 MG/5ML PO SUER: 5.0000 mL | Freq: Two times a day (BID) | ORAL | 0 refills | Status: DC | PRN

## 2022-11-06 MED ORDER — PREDNISONE 20 MG PO TABS: ORAL_TABLET | ORAL | 0 refills | Status: DC

## 2022-11-06 MED ORDER — TIZANIDINE HCL 4 MG PO TABS: 4.0000 mg | ORAL_TABLET | Freq: Three times a day (TID) | ORAL | 2 refills | Status: DC | PRN

## 2022-11-06 MED ORDER — DOXYCYCLINE HYCLATE 100 MG PO TABS: 100.0000 mg | ORAL_TABLET | Freq: Two times a day (BID) | ORAL | 0 refills | Status: DC

## 2022-11-06 NOTE — Patient Instructions (Addendum)
Thank you for coming to the office today.    Please schedule a Follow-up Appointment to: Return if symptoms worsen or fail to improve.  If you have any other questions or concerns, please feel free to call the office or send a message through MyChart. You may also schedule an earlier appointment if necessary.  Additionally, you may be receiving a survey about your experience at our office within a few days to 1 week by e-mail or mail. We value your feedback.  Donie Moulton, DO [facility], [facility] 

## 2022-11-06 NOTE — Progress Notes (Signed)
Subjective:    Patient ID: Caroline Conley, female    DOB: [birth year], 56 y.o.   MRN: [id number]  Caroline Conley is a 56 y.o. female presenting on 11/06/2022 for COPD   HPI  ACUTE COPD FLARE Last visit for COPD flare 08/2022 treated with levaquin, prednisone, tussionex, she did improve and resolved flare.  Now new onset past 7-10 days with new onset respiratory symptoms productive cough thicker sputum with color change. Similar to previous  Has seen Dr Mortimer Fries 09/2022, and they changed course with daily maintenance from Trelegy to [facility] but she did not like it as much did not improve and switched back to Trelegy  Admits cough, shortness of breath   Using nebulizer   Denies any known or suspected exposure to person with or possibly with COVID19.   Right Shoulder Rotator cuff S/p 10/25/22 rotator cuff arthroscopic surgery rotator cuff repair She presented back to hospital on 10/28/22 with nausea vomiting She has had dressing changed already  Report that everything is healing properly She has upcoming orthopedic apt 11/11/22 and upcoming Physical therapy       08/20/2022    3:22 PM 06/27/2022    1:10 PM 03/14/2022   11:22 AM  Depression screen PHQ 2/9  Decreased Interest 0 0 0  Down, Depressed, Hopeless 0 0 0  PHQ - 2 Score 0 0 0  Altered sleeping 0 0 0  Tired, decreased energy 0 0 0  Change in appetite 0 0 0  Feeling bad or failure about yourself  0 0 0  Trouble concentrating 0 0 0  Moving slowly or fidgety/restless 0 0 0  Suicidal thoughts 0 0 0  PHQ-9 Score 0 0 0  Difficult doing work/chores Not difficult at all Not difficult at all Not difficult at all    Social History   Tobacco Use   Smoking status: Former    Packs/day: 1.00    Years: 30.00    Total pack years: 30.00    Types: Cigarettes    Quit date: 03/02/2018    Years since quitting: 4.6   Smokeless tobacco: Never   Tobacco comments:    0.5 to 1 ppd >30 years  Vaping Use   Vaping Use: Never used   Substance Use Topics   Alcohol use: No   Drug use: Yes    Types: Oxycodone    Review of Systems Per HPI unless specifically indicated above     Objective:    BP (!) 144/88   Pulse (!) 115   Ht '5\' 6"'$  (1.676 m)   Wt 130 lb (59 kg)   SpO2 97%   BMI 20.98 kg/m   Wt Readings from Last 3 Encounters:  11/06/22 130 lb (59 kg)  10/29/22 130 lb (59 kg)  10/25/22 130 lb (59 kg)    Physical Exam Vitals and nursing note reviewed.  Constitutional:      General: She is not in acute distress.    Appearance: Normal appearance. She is well-developed. She is not diaphoretic.     Comments: Well-appearing, comfortable, cooperative  HENT:     Head: Normocephalic and atraumatic.  Eyes:     General:        Right eye: No discharge.        Left eye: No discharge.     Conjunctiva/sclera: Conjunctivae normal.  Cardiovascular:     Rate and Rhythm: Normal rate.  Pulmonary:     Effort: Pulmonary effort is normal.  Breath sounds: Wheezing and rhonchi present.  Musculoskeletal:     Right lower leg: No edema.     Left lower leg: No edema.     Comments: R arm shoulder in immobilizer brace  Skin:    General: Skin is warm and dry.     Findings: No erythema or rash.  Neurological:     Mental Status: She is alert and oriented to person, place, and time.  Psychiatric:        Mood and Affect: Mood normal.        Behavior: Behavior normal.        Thought Content: Thought content normal.     Comments: Well groomed, good eye contact, normal speech and thoughts    Results for orders placed or performed during the hospital encounter of 10/28/22  Resp Panel by RT-PCR (Flu A&B, Covid) Urine, Clean Catch   Specimen: Urine, Clean Catch; Nasal Swab  Result Value Ref Range   SARS Coronavirus 2 by RT PCR POSITIVE (A) NEGATIVE   Influenza A by PCR NEGATIVE NEGATIVE   Influenza B by PCR NEGATIVE NEGATIVE  Lipase, blood  Result Value Ref Range   Lipase 31 11 - 51 U/L  Comprehensive metabolic panel   Result Value Ref Range   Sodium 138 135 - 145 mmol/L   Potassium 3.6 3.5 - 5.1 mmol/L   Chloride 104 98 - 111 mmol/L   CO2 24 22 - 32 mmol/L   Glucose, Bld 129 (H) 70 - 99 mg/dL   BUN 8 6 - 20 mg/dL   Creatinine, Ser 0.98 0.44 - 1.00 mg/dL   Calcium 9.5 8.9 - 10.3 mg/dL   Total Protein 7.2 6.5 - 8.1 g/dL   Albumin 3.5 3.5 - 5.0 g/dL   AST 77 (H) 15 - 41 U/L   ALT 30 0 - 44 U/L   Alkaline Phosphatase 150 (H) 38 - 126 U/L   Total Bilirubin 0.8 0.3 - 1.2 mg/dL   GFR, Estimated >60 >60 mL/min   Anion gap 10 5 - 15  CBC  Result Value Ref Range   WBC 11.1 (H) 4.0 - 10.5 K/uL   RBC 4.36 3.87 - 5.11 MIL/uL   Hemoglobin 15.0 12.0 - 15.0 g/dL   HCT 42.2 36.0 - 46.0 %   MCV 96.8 80.0 - 100.0 fL   MCH 34.4 (H) 26.0 - 34.0 pg   MCHC 35.5 30.0 - 36.0 g/dL   RDW 11.9 11.5 - 15.5 %   Platelets 277 150 - 400 K/uL   nRBC 0.0 0.0 - 0.2 %  Urinalysis, Routine w reflex microscopic Urine, Clean Catch  Result Value Ref Range   Color, Urine STRAW (A) YELLOW   APPearance CLEAR (A) CLEAR   Specific Gravity, Urine 1.008 1.005 - 1.030   pH 6.0 5.0 - 8.0   Glucose, UA NEGATIVE NEGATIVE mg/dL   Hgb urine dipstick MODERATE (A) NEGATIVE   Bilirubin Urine NEGATIVE NEGATIVE   Ketones, ur NEGATIVE NEGATIVE mg/dL   Protein, ur NEGATIVE NEGATIVE mg/dL   Nitrite NEGATIVE NEGATIVE   Leukocytes,Ua NEGATIVE NEGATIVE   RBC / HPF 0-5 0 - 5 RBC/hpf   WBC, UA 6-10 0 - 5 WBC/hpf   Bacteria, UA NONE SEEN NONE SEEN   Squamous Epithelial / LPF 0-5 0 - 5   Mucus PRESENT   Phosphorus  Result Value Ref Range   Phosphorus 2.7 2.5 - 4.6 mg/dL  Magnesium  Result Value Ref Range   Magnesium 1.8 1.7 - 2.4  mg/dL  Procalcitonin - Baseline  Result Value Ref Range   Procalcitonin <0.10 ng/mL      Assessment & Plan:   Problem List Items Addressed This Visit     Shoulder joint pain   Relevant Medications   tiZANidine (ZANAFLEX) 4 MG tablet   Other Visit Diagnoses     COPD with acute exacerbation ([place])    -   Primary   Relevant Medications   doxycycline (VIBRA-TABS) 100 MG tablet   chlorpheniramine-HYDROcodone (TUSSIONEX) 10-8 MG/5ML   predniSONE (DELTASONE) 20 MG tablet   S/P right rotator cuff repair           Consistent with mild-acute exacerbation of COPD with worsening productive cough. Similar to prior exacerbations, last 08/2022. - No hypoxia (97% on RA), afebrile  Followed by Pulm  On Trelegy again, came off of Breztri less effective  Treat AECOPD with Doxycycline course, Tussionex cough syrup, Prednisone taper, has diflucan if need.   Followed by Ortho Postop R rotator cuff, has upcoming visit and physical therapy Add Tizanidine AS NEEDED   Meds ordered this encounter  Medications   tiZANidine (ZANAFLEX) 4 MG tablet    Sig: Take 1 tablet (4 mg total) by mouth every 8 (eight) hours as needed for muscle spasms.    Dispense:  90 tablet    Refill:  2   doxycycline (VIBRA-TABS) 100 MG tablet    Sig: Take 1 tablet (100 mg total) by mouth 2 (two) times daily. For 10 days. Take with full glass of water, stay upright 30 min after taking.    Dispense:  20 tablet    Refill:  0   chlorpheniramine-HYDROcodone (TUSSIONEX) 10-8 MG/5ML    Sig: Take 5 mLs by mouth every 12 (twelve) hours as needed for cough.    Dispense:  140 mL    Refill:  0   predniSONE (DELTASONE) 20 MG tablet    Sig: Take daily with food. Start with '40mg'$  (2 pills) x 4 days, then reduce to '20mg'$  (1 pills) x 4 days, then '10mg'$  (half pill) x 2 day    Dispense:  13 tablet    Refill:  0      Follow up plan: Return if symptoms worsen or fail to improve.   [name], DO [facility] 11/06/2022, 2:50 PM

## 2022-11-12 ENCOUNTER — Ambulatory Visit
Admission: RE | Admit: 2022-11-12 | Discharge: 2022-11-12 | Disposition: A | Discharge status: Home / Self Care | Payer: 59 | Source: Ambulatory Visit | Attending: Family Medicine | Admitting: Family Medicine

## 2022-11-12 ENCOUNTER — Ambulatory Visit
Admission: RE | Admit: 2022-11-12 | Discharge: 2022-11-12 | Disposition: A | Discharge status: Home / Self Care | Payer: 59 | Attending: Family Medicine | Admitting: Family Medicine

## 2022-11-12 ENCOUNTER — Other Ambulatory Visit: Payer: Self-pay | Admitting: Family Medicine

## 2022-11-12 DIAGNOSIS — J441 Chronic obstructive pulmonary disease with (acute) exacerbation: Secondary | ICD-10-CM

## 2022-11-12 MED ORDER — PREDNISONE 20 MG PO TABS: ORAL_TABLET | ORAL | 0 refills | Status: DC

## 2022-11-12 MED ORDER — LEVOFLOXACIN 500 MG PO TABS: 500.0000 mg | ORAL_TABLET | Freq: Every day | ORAL | 0 refills | Status: DC

## 2022-11-12 NOTE — Addendum Note (Signed)
Addended by: Olin Hauser on: 11/12/2022 03:30 PM   Modules accepted: Orders

## 2022-11-19 ENCOUNTER — Other Ambulatory Visit: Payer: Self-pay | Admitting: Family Medicine

## 2022-11-19 DIAGNOSIS — R112 Nausea with vomiting, unspecified: Secondary | ICD-10-CM

## 2022-11-19 NOTE — Telephone Encounter (Signed)
Requested medications are due for refill today.  unsure  Requested medications are on the active medications list.  yes  Last refill. 10/22/2022 #20 2 rf  Future visit scheduled.   no  Notes to clinic.  Refill not delegated.    Requested Prescriptions  Pending Prescriptions Disp Refills   promethazine (PHENERGAN) 25 MG tablet [Pharmacy Med Name: PROMETHAZINE HCL 25 MG TAB] 20 tablet 2    Sig: TAKE 1/2-1 TABLET BY MOUTH EVERY [place] NEEDED FOR NAUSEA OR VOMITING     Not Delegated - Gastroenterology: Antiemetics Failed - 11/19/2022  5:12 PM      Failed - This refill cannot be delegated      Passed - Valid encounter within last 6 months    Recent Outpatient Visits           1 week ago COPD with acute exacerbation [facility])   [place], DO   3 months ago COPD with acute exacerbation [facility])   [place], DO   4 months ago COPD with acute exacerbation [facility])   [place], DO   8 months ago Right inguinal hernia   [place], DO   8 months ago COPD with acute exacerbation [facility])   [place], DO       Future Appointments             In 1 week Gerrie Nordmann, NP [place]. [place]   In 4 weeks Brendolyn Patty, MD [place]

## 2022-11-25 NOTE — Progress Notes (Deleted)
Cardiology Clinic Note   Patient Name: Caroline Conley Date of Encounter: 11/25/2022  Primary Care Provider:  Olin Hauser, DO Primary Cardiologist:  [name], MD  Patient Profile    55 year old female with a history of tachypalpitations, atypical chest pain with normal coronary arteries by catheterization in 2015 with RCA vasospasm noted on catheterization July 2022, nonischemic cardiomyopathy/HFrEF (EF 35 to 40% in July 2022), postcatheterization right radial artery occlusion who presents for follow-up related to cardiomyopathy.  Past Medical History    Past Medical History:  Diagnosis Date   Atypical chest pain    a. 2015 Cath ([place]): Nl cors. Nl EF; b. 10/2018 MV: No ischemia/infarct. EF>65%.   COPD (chronic obstructive pulmonary disease) (HCC)    Coronary vasospasm ([place])    a. 06/2021 Cath: LM nl, LADnl, LCX large/nl, RCA 35p (spasm-->tx w/ IC NTG w/ resultant VF req defib).   Dysplastic nevus 02/02/2015   R upper back - excision   Dysplastic nevus 02/16/2015   L hip - excision   Dysplastic nevus 08/24/2015   L upper abdomen   Dysplastic nevus 08/11/2017   mid abdomen - excised   Dysplastic nevus 07/29/2017   Left neck   Full dentures    Gastritis    GERD (gastroesophageal reflux disease)    HFrEF (heart failure with reduced ejection fraction) ([place])    Hyperlipidemia    Iatrogenic ventricular fibrillation 07/02/2021   a.) during [place] --> coronary vasospasm Tx'd with IC NTG resulting in VF requiring defibrillation; no recurrence.   LBBB (left bundle branch block)    Melanoma ([place]) 08/24/2015   L buttocks - B[place]low depth 0.74m, Ulceration: absent, Mitotic index: 1 per HPF, TILs: Non-brisk, SLNbx: negative   NICM (nonischemic cardiomyopathy) ([place]    a.) 08/04/2018: EF 40%; ant/anterosepal HK, GLS -13.7; mild-mod MR; G1DD.  b.) 06/22/2021 TTE: EF 40%, ant/antseptal/septal HK. G1DD. Nl RV fxn. Mild-mod MR; c.) 06/2021 LHC: Nl cors w/ RCA vasospasm  (35%). VF w/ IC NTG admin req defib; d.) 07/02/2021 TTE: EF 35-40%. Glob HK. Mild MR. e.) TTE 02/07/2022: EF 35-40%; basal mid-anteroseptal AK; mild MR; G2DD.   Occlusion of right radial artery ([place]    a. 06/2021 s/p cardiac cath-->u/s w/ R radial occlusion from puncture extending up entire forearm-->Lovenox '1mg'$ /kg BID x 4 wks; b. 07/2021 U/S: Thrombus in prox Radial artery w/ minimal reconstitution of flow - improved w/ multiphasic waveforms in R brachial artery.   Palpitations    a. 07/2018 48h Holter: predominantly RSR, avg rate 106 (69-166 bpm), Rare PAC's and occas PVC's. 2 atrial runs up to 5 beats. No sustained arrhythmias or prolonged pauses. No symptoms reported; b. 07/2021 Zio: Avg HR 102 bpm, intermittent bundle branch block, rare PACs and PVCs, 9 atrial runs, longest 13.7 secs w/ max HR of 226 bpm.   Skin cancer, basal cell 01/2016   Wears dentures    full upper and lower   Past Surgical History:  Procedure Laterality Date   ABDOMINAL HYSTERECTOMY     APPENDECTOMY     BACK SURGERY     repeat lumbar herniated disc; apply cage   BUNIONECTOMY Right    CARDIAC CATHETERIZATION  09/20/2014   [facility] - Dr. CClayborn Bigness  CHOLECYSTECTOMY     COLONOSCOPY WITH PROPOFOL N/A 07/08/2016   Procedure: COLONOSCOPY WITH PROPOFOL;  Surgeon: DLucilla Lame MD;  [facility]  Service: Endoscopy;  Laterality: N/A;   DEBRIDEMENT TENNIS ELBOW     ESOPHAGOGASTRODUODENOSCOPY  09/16/2014   HARDWARE REMOVAL Left 01/04/2022   Procedure: HARDWARE REMOVAL;  Surgeon: Sharlotte Alamo, DPM;  Location: [facility];  Service: Podiatry;  Laterality: Left;   INGUINAL HERNIA REPAIR Right 04/17/2022   Procedure: HERNIA REPAIR INGUINAL ADULT;  Surgeon: Robert Bellow, MD;  Location: [facility];  Service: General;  Laterality: Right;  open   LEFT HEART CATH AND CORONARY ANGIOGRAPHY N/A 07/02/2021   Procedure: LEFT HEART CATH AND CORONARY ANGIOGRAPHY;  Surgeon: [name], MD;  Location: [place] [facility];   Service: Cardiovascular;  Laterality: N/A;   LUMBAR DISC SURGERY     herniated disc   LUMBAR DISC SURGERY     took screws out from previous lumbar disc surgery   METATARSAL OSTEOTOMY  05/18/2013   ROTATOR CUFF REPAIR Right    SHOULDER ARTHROSCOPY Right 10/25/2022   Procedure: Right shoulder arthroscopic mini open rotator cuff repair, subacromial decompression, and distal clavicle excision;  Surgeon: Leim Fabry, MD;  Location: [place];  Service: Orthopedics;  Laterality: Right;   TONSILLECTOMY      Allergies  Allergies  Allergen Reactions   2,4-D Dimethylamine Itching    (Pt unsure of this allergy) Herbacide? Patient denies having this allergy and states she has no idea why it was added to her chart.    Vicodin [Hydrocodone-Acetaminophen] Nausea And Vomiting    GI distress   Morphine And Related Itching    History of Present Illness    Caroline Conley is a 56 year old female with the above past medical history including atypical chest pain status post catheterization 2015 revealing normal coronary arteries, COPD, prior tobacco abuse, basal cell carcinoma, and palpitations.  In early July she was seen for preoperative evaluation pending right shoulder surgery secondary to a new incomplete left bundle branch block.  Recommendation was made for stress testing and echocardiogram.  Echo revealed EF of 40% with anterior, anteroseptal, and left septal hypokinesis.  Stress testing showed a medium defect of mild severity in the mid anterior and apical anterior location concerning for LAD ischemia.  She then underwent diagnostic catheterization which revealed normal coronary arteries with exception of 35% proximal stenosis in the RCA deemed to be secondary to coronary vasospasm.  With administration of nitro she had ventricular fibrillation required defibrillation.  Limited echocardiogram showed persistent LV dysfunction with LVEF of 35-40%.  She had radial artery perfusion that was noted  to be decreased and radial artery ultrasound was performed and showed occlusion of the right radial artery for the procedure insertion site of the entire forearm.  She was placed on Lovenox 1 mg/kilogram twice daily for planned duration of 4 weeks.  ZIO monitor was placed given prior ventricular fibrillation during catheterization and showed an average heart rate of 102 bpm, intermittent bundle branch block, rare PACs and PVCs.  She had stopped taking her bisoprolol secondary to GI upset.  She was last seen in clinic 02/06/2022 by Dr. Vicente Males reports that she was not feeling much better.  She was still complaining of daily chest pain she describes it heartburn that was not related to activity.  She had also endorsed more palpitations and lightheadedness.  She was ordered a repeat echocardiogram and determination on findings consideration for referral to advanced heart failure clinic or EP she was also sent for blood work.  Echocardiogram revealed LVEF of 35-40%, there were some regional wall motion abnormalities, G2 DD, mild mitral valve regurgitation.  She was encouraged to continue with her current medication regimen as well as  to continue on colchicine that had just previously been started.  She was evaluated at the [facility] emergency department 10/28/2022 for intractable nausea and vomiting.  She was status post arthroscopic rotator cuff repair and mini open rotator cuff repair on 10/25/2022.  Initial vitals were blood pressure 144/112, pulse of 160, respirations 20, temperature 99.9.  Labs were pertinent for sodium 138, potassium 3.6, chloride 104, bicarb 24, BUN of 8, serum creatinine of 0.98, glucose of 129, WBCs of 11.1, hemoglobin of 15.  CTA of the chest was negative for PE.  She was treated with Zofran and Phenergan.  Home metoprolol which 12.5 mg daily was resumed.  Respiratory panel did come back positive for COVID and on 10/29/2022 she was expressing the desire to leave the hospital and mediastinum the  bed was uncomfortable she was advised that was not medically advisable at that time and can result in the medical complications like death and disability.  So she left the facility AMA.  She returns to clinic today  Home Medications    Current Outpatient Medications  Medication Sig Dispense Refill   acetaminophen (TYLENOL) 500 MG tablet Take 2 tablets (1,000 mg total) by mouth every 8 (eight) hours. 90 tablet 2   albuterol (PROAIR HFA) 108 (90 Base) MCG/ACT inhaler Inhale 2 puffs into the lungs every 4 (four) hours as needed for wheezing or shortness of breath. 1 each 10   albuterol (PROVENTIL) (2.5 MG/3ML) 0.083% nebulizer solution Take 3 mLs (2.5 mg total) by nebulization every 4 (four) hours as needed for shortness of breath or wheezing. 75 mL 12   chlorpheniramine-HYDROcodone (TUSSIONEX) 10-8 MG/5ML Take 5 mLs by mouth every 12 (twelve) hours as needed for cough. 140 mL 0   fluconazole (DIFLUCAN) 150 MG tablet TAKE 1 TABLET BY MOUTH ON DAY 1 . REPEAT2ND DOSE ON DAY 3 6 tablet 3   levofloxacin (LEVAQUIN) 500 MG tablet Take 1 tablet (500 mg total) by mouth daily. For 7 days 7 tablet 0   magic mouthwash w/lidocaine SOLN Swish, gargle, and spit one to two teaspoonfuls every six hours as needed. Shake well before using. 120 mL 0   metoprolol succinate (TOPROL-XL) 25 MG 24 hr tablet Take 0.5 tablets (12.5 mg total) by mouth daily. 45 tablet 3   Misc. Devices (PULSE OXIMETER FOR FINGER) MISC 1 Device by Does not apply route as needed (for cough, dyspnea). Pulse oximeter for finger for checking oxygen saturation in COPD 1 each 0   omeprazole (PRILOSEC) 40 MG capsule Take 1 capsule (40 mg total) by mouth daily before breakfast. 90 capsule 3   oxyCODONE-acetaminophen (PERCOCET) 10-325 MG tablet Take 1 tablet by mouth every 4 (four) hours.     predniSONE (DELTASONE) 20 MG tablet Take daily with food. Start with '60mg'$  (3 pills) x 3 days, then reduce to '40mg'$  (2 pills) x 2 days, then '10mg'$  (half pill) for 2  days 14 tablet 0   promethazine (PHENERGAN) 25 MG tablet TAKE 1/2-1 TABLET BY MOUTH EVERY 8 HOURSAS NEEDED FOR NAUSEA OR VOMITING 20 tablet 2   roflumilast (DALIRESP) 500 MCG TABS tablet Take 1 tablet (500 mcg total) by mouth daily. 30 tablet 10   silver sulfADIAZINE (SILVADENE) 1 % cream Apply 1 Application topically daily. For 1-2 weeks then as needed 50 g 0   tiZANidine (ZANAFLEX) 4 MG tablet Take 1 tablet (4 mg total) by mouth every 8 (eight) hours as needed for muscle spasms. 90 tablet 2   TRELEGY ELLIPTA 100-62.5-25 MCG/ACT AEPB  Inhale 1 puff into the lungs daily. 60 each 11   No current facility-administered medications for this visit.     Family History    Family History  Problem Relation Age of Onset   Diabetes Mother    High blood pressure Mother    Gout Mother    Cancer Father    Stroke Father    Arthritis/Rheumatoid Father    Diabetes Father    High blood pressure Father    Heart failure Father    Heart failure Brother    Diabetes Brother    She indicated that her mother is alive. She indicated that her father is deceased. She indicated that her brother is alive.  Social History    Social History   Socioeconomic History   Marital status: Married    Spouse name: Patrick Jupiter   Number of children: 2   Years of education: Not on file   Highest education level: Not on file  Occupational History   Occupation: disability  Tobacco Use   Smoking status: Former    Packs/day: 1.00    Years: 30.00    Total pack years: 30.00    Types: Cigarettes    Quit date: 03/02/2018    Years since quitting: 4.7   Smokeless tobacco: Never   Tobacco comments:    0.5 to 1 ppd >30 years  Vaping Use   Vaping Use: Never used  Substance and Sexual Activity   Alcohol use: No   Drug use: Yes    Types: Oxycodone   Sexual activity: Yes  Other Topics Concern   Not on file  Social History Narrative   Not on file   Social Determinants of Health   Financial Resource Strain: Low Risk   (03/14/2022)   Overall Financial Resource Strain (CARDIA)    Difficulty of Paying Living Expenses: Not very hard  Food Insecurity: No Food Insecurity (03/14/2022)   Hunger Vital Sign    Worried About Running Out of Food in the Last Year: Never true    Ran Out of Food in the Last Year: Never true  Transportation Needs: No Transportation Needs (03/14/2022)   PRAPARE - Hydrologist (Medical): No    Lack of Transportation (Non-Medical): No  Physical Activity: Insufficiently Active (03/14/2022)   Exercise Vital Sign    Days of Exercise per Week: 3 days    Minutes of Exercise per Session: 20 min  Stress: No Stress Concern Present (03/14/2022)   [place]    Feeling of Stress : Not at all  Social Connections: Moderately Integrated (03/14/2022)   Social Connection and Isolation Panel [NHANES]    Frequency of Communication with Friends and Family: More than three times a week    Frequency of Social Gatherings with Friends and Family: More than three times a week    Attends Religious Services: 1 to 4 times per year    Active Member of [employer] or Organizations: No    Attends Archivist Meetings: Never    Marital Status: Married  Human resources officer Violence: Not on file     Review of Systems    General:  No chills, fever, night sweats or weight changes.  Cardiovascular:  No chest pain, dyspnea on exertion, edema, orthopnea, palpitations, paroxysmal nocturnal dyspnea. Dermatological: No rash, lesions/masses Respiratory: No cough, dyspnea Urologic: No hematuria, dysuria Abdominal:   No nausea, vomiting, diarrhea, bright red blood per rectum, melena, or hematemesis Neurologic:  No visual changes, wkns, changes in mental status. All other systems reviewed and are otherwise negative except as noted above.     Physical Exam    VS:  There were no vitals taken for this visit. , BMI There is no  height or weight on file to calculate BMI.     GEN: Well nourished, well developed, in no acute distress. HEENT: normal. Neck: Supple, no JVD, carotid bruits, or masses. Cardiac: RRR, no murmurs, rubs, or gallops. No clubbing, cyanosis, edema.  Radials 2+/PT 2+ and equal bilaterally.  Respiratory:  Respirations regular and unlabored, clear to auscultation bilaterally. GI: Soft, nontender, nondistended, BS + x 4. MS: no deformity or atrophy. Skin: warm and dry, no rash. Neuro:  Strength and sensation are intact. Psych: Normal affect.  Accessory Clinical Findings    ECG personally reviewed by me today- *** - No acute changes  Lab Results  Component Value Date   WBC 11.1 (H) 10/28/2022   HGB 15.0 10/28/2022   HCT 42.2 10/28/2022   MCV 96.8 10/28/2022   PLT 277 10/28/2022   Lab Results  Component Value Date   CREATININE 0.98 10/28/2022   BUN 8 10/28/2022   NA 138 10/28/2022   K 3.6 10/28/2022   CL 104 10/28/2022   CO2 24 10/28/2022   Lab Results  Component Value Date   ALT 30 10/28/2022   AST 77 (H) 10/28/2022   ALKPHOS 150 (H) 10/28/2022   BILITOT 0.8 10/28/2022   Lab Results  Component Value Date   CHOL 169 07/29/2017   HDL 35 (L) 07/29/2017   LDLCALC 85 07/29/2017   TRIG 243 (H) 07/29/2017   CHOLHDL 4.8 07/29/2017    Lab Results  Component Value Date   HGBA1C 4.9 07/29/2017    Assessment & Plan   1.  ***  Lamanda Rudder, NP 11/25/2022, 1:28 PM

## 2022-11-26 ENCOUNTER — Ambulatory Visit: Payer: 59 | Attending: Cardiology | Admitting: Cardiology

## 2022-11-27 ENCOUNTER — Encounter: Payer: Self-pay | Admitting: Cardiology

## 2022-12-17 ENCOUNTER — Ambulatory Visit: Payer: 59 | Admitting: Dermatology

## 2023-01-07 ENCOUNTER — Other Ambulatory Visit: Payer: Self-pay | Admitting: Family Medicine

## 2023-01-07 DIAGNOSIS — R112 Nausea with vomiting, unspecified: Secondary | ICD-10-CM

## 2023-01-08 NOTE — Telephone Encounter (Signed)
Requested medication (s) are due for refill today - no  Requested medication (s) are on the active medication list -yes  Future visit scheduled -no  Last refill: 11/20/22 #20 2RF  Notes to clinic: non delegated Rx  Requested Prescriptions  Pending Prescriptions Disp Refills   promethazine (PHENERGAN) 25 MG tablet [Pharmacy Med Name: PROMETHAZINE HCL 25 MG TAB] 20 tablet 2    Sig: TAKE 1/2-1 TABLET BY MOUTH EVERY [place] NEEDED FOR NAUSEA OR VOMITING     Not Delegated - Gastroenterology: Antiemetics Failed - 01/07/2023  5:28 PM      Failed - This refill cannot be delegated      Passed - Valid encounter within last 6 months    Recent Outpatient Visits           2 months ago COPD with acute exacerbation [facility])   [place], DO   4 months ago COPD with acute exacerbation [facility])   [facility] [place], Devonne Doughty, DO   6 months ago COPD with acute exacerbation [facility])   [facility] Olin Hauser, DO   10 months ago Right inguinal hernia   [place], DO   10 months ago COPD with acute exacerbation [facility])   [place], DO                 Requested Prescriptions  Pending Prescriptions Disp Refills   promethazine (PHENERGAN) 25 MG tablet [Pharmacy Med Name: PROMETHAZINE HCL 25 MG TAB] 20 tablet 2    Sig: TAKE 1/2-1 TABLET BY MOUTH EVERY [place]     Not Delegated - Gastroenterology: Antiemetics Failed - 01/07/2023  5:28 PM      Failed - This refill cannot be delegated      Passed - Valid encounter within last 6 months    Recent Outpatient Visits           2 months ago COPD with acute exacerbation [facility])   [place], DO   4 months ago COPD with acute  exacerbation [facility])   [facility] Olin Hauser, DO   6 months ago COPD with acute exacerbation [facility])   [place], DO   10 months ago Right inguinal hernia   [place], DO   10 months ago COPD with acute exacerbation [facility])   [place], [place]

## 2023-01-29 ENCOUNTER — Ambulatory Visit: Payer: Self-pay | Admitting: *Deleted

## 2023-01-29 ENCOUNTER — Encounter: Payer: Self-pay | Admitting: Family Medicine

## 2023-01-29 ENCOUNTER — Ambulatory Visit (INDEPENDENT_AMBULATORY_CARE_PROVIDER_SITE_OTHER): Payer: Medicare HMO | Admitting: Family Medicine

## 2023-01-29 VITALS — BP 108/64 | HR 95 | Temp 97.1°F | Wt 129.0 lb

## 2023-01-29 DIAGNOSIS — J441 Chronic obstructive pulmonary disease with (acute) exacerbation: Secondary | ICD-10-CM | POA: Diagnosis not present

## 2023-01-29 DIAGNOSIS — H9193 Unspecified hearing loss, bilateral: Secondary | ICD-10-CM

## 2023-01-29 DIAGNOSIS — B370 Candidal stomatitis: Secondary | ICD-10-CM

## 2023-01-29 MED ORDER — PREDNISONE 20 MG PO TABS: ORAL_TABLET | ORAL | 0 refills | Status: DC

## 2023-01-29 MED ORDER — HYDROCOD POLI-CHLORPHE POLI ER 10-8 MG/5ML PO SUER: 5.0000 mL | Freq: Two times a day (BID) | ORAL | 0 refills | Status: DC | PRN

## 2023-01-29 MED ORDER — LEVOFLOXACIN 500 MG PO TABS: 500.0000 mg | ORAL_TABLET | Freq: Every day | ORAL | 0 refills | Status: DC

## 2023-01-29 MED ORDER — FLUCONAZOLE 150 MG PO TABS: 150.0000 mg | ORAL_TABLET | ORAL | 3 refills | Status: DC

## 2023-01-29 NOTE — Patient Instructions (Addendum)
Thank you for coming to the office today.  Start Levaquin x 7 days  12 day prednisone taper, may stop the last 2 days if it has resolved.  Cough syrup ordered, let me know if it is back order.  You should have Trelegy refills available, sent 08/2022  Please schedule a Follow-up Appointment to: Return if symptoms worsen or fail to improve.  If you have any other questions or concerns, please feel free to call the office or send a message through [place]. You may also schedule an earlier appointment if necessary.  Additionally, you may be receiving a survey about your experience at our office within a few days to 1 week by e-mail or mail. We value your feedback.  [name], DO [place]

## 2023-01-29 NOTE — Telephone Encounter (Signed)
Reason for Disposition  [1] Longstanding difficulty breathing (e.g., CHF, COPD, emphysema) AND [2] WORSE than normal  Answer Assessment - Initial Assessment Questions 1. RESPIRATORY STATUS: "Describe your breathing?" (e.g., wheezing, shortness of breath, unable to speak, severe coughing)      I'm having a COPD flare up.     I'm coughing up yellow mucus.    2. ONSET: "When did this breathing problem begin?"      4 days ago this started.    I may have have a low grade fever. 3. PATTERN "Does the difficult breathing come and go, or has it been constant since it started?"      Constantly for the last 4 days.    I do this every couple of months or so.   This is my normal.    4. SEVERITY: "How bad is your breathing?" (e.g., mild, moderate, severe)    - MILD: No SOB at rest, mild SOB with walking, speaks normally in sentences, can lie down, no retractions, pulse < 100.    - MODERATE: SOB at rest, SOB with minimal exertion and prefers to sit, cannot lie down flat, speaks in phrases, mild retractions, audible wheezing, pulse 100-120.    - SEVERE: Very SOB at rest, speaks in single words, struggling to breathe, sitting hunched forward, retractions, pulse > 120      COPD is flaring up.    I'm short of breath all the time but I'm coughing up yellow mucus. 5. RECURRENT SYMPTOM: "Have you had difficulty breathing before?" If Yes, ask: "When was the last time?" and "What happened that time?"      Yes 6. CARDIAC HISTORY: "Do you have any history of heart disease?" (e.g., heart attack, angina, bypass surgery, angioplasty)      Not asked 7. LUNG HISTORY: "Do you have any history of lung disease?"  (e.g., pulmonary embolus, asthma, emphysema)     COPD 8. CAUSE: "What do you think is causing the breathing problem?"      COPD flare up.  Triage ended here as she is having her usual symptoms and is not in distress but doesn't want it to go into pneumonia.   Has a appt for today with Dr. Parks Ranger.    9. OTHER  SYMPTOMS: "Do you have any other symptoms? (e.g., dizziness, runny nose, cough, chest pain, fever)     Not askd 10. O2 SATURATION MONITOR:  "Do you use an oxygen saturation monitor (pulse oximeter) at home?" If Yes, ask: "What is your reading (oxygen level) today?" "What is your usual oxygen saturation reading?" (e.g., 95%)        11. PREGNANCY: "Is there any chance you are pregnant?" "When was your last menstrual period?"        12. TRAVEL: "Have you traveled out of the country in the last month?" (e.g., travel history, exposures)  Protocols used: Breathing Difficulty-A-AH

## 2023-01-29 NOTE — Telephone Encounter (Signed)
  Chief Complaint: COPD flare up Symptoms: Coughing up yellow mucus, maybe a low grade fever Frequency: For the last 4 days Pertinent Negatives: Patient denies shortness of breath being any worse than her usual just that she is coughing up yellow mucus now and doesn't want to get pneumonia. Disposition: '[]'$ ED /'[]'$ Urgent Care (no appt availability in office) / '[x]'$ Appointment(In office/virtual)/ '[]'$  [facility]/ '[]'$ Home Care/ '[]'$ Refused Recommended Disposition /'[]'$ [place] [facility]/ '[]'$  Follow-up with PCP Additional Notes: Has an appt. Today with Dr. Parks Ranger for 3:20.

## 2023-01-29 NOTE — Progress Notes (Signed)
Subjective:    Patient ID: Caroline Conley, female    DOB: 06/17/1966, 57 y.o.   MRN: [id number]  Caroline Conley is a 57 y.o. female presenting on 01/29/2023 for No chief complaint on file.   HPI  ACUTE COPD FLARE  last flare 10/2022 failed doxycycline, required levaquin Mild temp elevated Dyspnea with productive cough.  Now new onset past 4-5 days with new onset respiratory symptoms productive cough thicker sputum with color change. Similar to previous flares   She has continued Trelegy, needs re order. Off breztri   Admits cough, shortness of breath   Using nebulizer   Denies any known or suspected exposure to person with or possibly with COVID19.  Hearing Loss Reports chronic bilateral hearing loss Reqeusting hearing evaluation by audiology     01/29/2023    3:57 PM 08/20/2022    3:22 PM 06/27/2022    1:10 PM  Depression screen PHQ 2/9  Decreased Interest 0 0 0  Down, Depressed, Hopeless 0 0 0  PHQ - 2 Score 0 0 0  Altered sleeping 0 0 0  Tired, decreased energy 0 0 0  Change in appetite 0 0 0  Feeling bad or failure about yourself  0 0 0  Trouble concentrating 0 0 0  Moving slowly or fidgety/restless 0 0 0  Suicidal thoughts 0 0 0  PHQ-9 Score 0 0 0  Difficult doing work/chores Not difficult at all Not difficult at all Not difficult at all    Social History   Tobacco Use   Smoking status: Former    Packs/day: 1.00    Years: 30.00    Total pack years: 30.00    Types: Cigarettes    Quit date: 03/02/2018    Years since quitting: 4.9   Smokeless tobacco: Never   Tobacco comments:    0.5 to 1 ppd >30 years  Vaping Use   Vaping Use: Never used  Substance Use Topics   Alcohol use: No   Drug use: Yes    Types: Oxycodone    Review of Systems Per HPI unless specifically indicated above     Objective:    BP 108/64 (BP Location: Left Arm, Patient Position: Sitting, Cuff Size: Normal)   Pulse 95   Temp (!) 97.1 F (36.2 C) (Temporal)   Wt 129 lb  (58.5 kg)   SpO2 99%   BMI 20.82 kg/m   Wt Readings from Last 3 Encounters:  01/29/23 129 lb (58.5 kg)  11/06/22 130 lb (59 kg)  10/29/22 130 lb (59 kg)    Physical Exam Vitals and nursing note reviewed.  Constitutional:      General: She is not in acute distress.    Appearance: She is well-developed. She is not diaphoretic.     Comments: Well-appearing, comfortable, cooperative  HENT:     Head: Normocephalic and atraumatic.  Eyes:     General:        Right eye: No discharge.        Left eye: No discharge.     Conjunctiva/sclera: Conjunctivae normal.  Neck:     Thyroid: No thyromegaly.  Cardiovascular:     Rate and Rhythm: Normal rate and regular rhythm.     Heart sounds: Normal heart sounds. No murmur heard. Pulmonary:     Effort: Pulmonary effort is normal. No respiratory distress.     Breath sounds: Wheezing and rhonchi present. No rales.  Musculoskeletal:        General: Normal range  of motion.     Cervical back: Normal range of motion and neck supple.  Lymphadenopathy:     Cervical: No cervical adenopathy.  Skin:    General: Skin is warm and dry.     Findings: No erythema or rash.  Neurological:     Mental Status: She is alert and oriented to person, place, and time.  Psychiatric:        Behavior: Behavior normal.     Comments: Well groomed, good eye contact, normal speech and thoughts    Results for orders placed or performed during the hospital encounter of 10/28/22  Resp Panel by RT-PCR (Flu A&B, Covid) Urine, Clean Catch   Specimen: Urine, Clean Catch; Nasal Swab  Result Value Ref Range   SARS Coronavirus 2 by RT PCR POSITIVE (A) NEGATIVE   Influenza A by PCR NEGATIVE NEGATIVE   Influenza B by PCR NEGATIVE NEGATIVE  Lipase, blood  Result Value Ref Range   Lipase 31 11 - 51 U/L  Comprehensive metabolic panel  Result Value Ref Range   Sodium 138 135 - 145 mmol/L   Potassium 3.6 3.5 - 5.1 mmol/L   Chloride 104 98 - 111 mmol/L   CO2 24 22 - 32 mmol/L    Glucose, Bld 129 (H) 70 - 99 mg/dL   BUN 8 6 - 20 mg/dL   Creatinine, Ser 0.98 0.44 - 1.00 mg/dL   Calcium 9.5 8.9 - 10.3 mg/dL   Total Protein 7.2 6.5 - 8.1 g/dL   Albumin 3.5 3.5 - 5.0 g/dL   AST 77 (H) 15 - 41 U/L   ALT 30 0 - 44 U/L   Alkaline Phosphatase 150 (H) 38 - 126 U/L   Total Bilirubin 0.8 0.3 - 1.2 mg/dL   GFR, Estimated >60 >60 mL/min   Anion gap 10 5 - 15  CBC  Result Value Ref Range   WBC 11.1 (H) 4.0 - 10.5 K/uL   RBC 4.36 3.87 - 5.11 MIL/uL   Hemoglobin 15.0 12.0 - 15.0 g/dL   HCT 42.2 36.0 - 46.0 %   MCV 96.8 80.0 - 100.0 fL   MCH 34.4 (H) 26.0 - 34.0 pg   MCHC 35.5 30.0 - 36.0 g/dL   RDW 11.9 11.5 - 15.5 %   Platelets 277 150 - 400 K/uL   nRBC 0.0 0.0 - 0.2 %  Urinalysis, Routine w reflex microscopic Urine, Clean Catch  Result Value Ref Range   Color, Urine STRAW (A) YELLOW   APPearance CLEAR (A) CLEAR   Specific Gravity, Urine 1.008 1.005 - 1.030   pH 6.0 5.0 - 8.0   Glucose, UA NEGATIVE NEGATIVE mg/dL   Hgb urine dipstick MODERATE (A) NEGATIVE   Bilirubin Urine NEGATIVE NEGATIVE   Ketones, ur NEGATIVE NEGATIVE mg/dL   Protein, ur NEGATIVE NEGATIVE mg/dL   Nitrite NEGATIVE NEGATIVE   Leukocytes,Ua NEGATIVE NEGATIVE   RBC / HPF 0-5 0 - 5 RBC/hpf   WBC, UA 6-10 0 - 5 WBC/hpf   Bacteria, UA NONE SEEN NONE SEEN   Squamous Epithelial / HPF 0-5 0 - 5   Mucus PRESENT   Phosphorus  Result Value Ref Range   Phosphorus 2.7 2.5 - 4.6 mg/dL  Magnesium  Result Value Ref Range   Magnesium 1.8 1.7 - 2.4 mg/dL  Procalcitonin - Baseline  Result Value Ref Range   Procalcitonin <0.10 ng/mL      Assessment & Plan:   Problem List Items Addressed This Visit   None Visit Diagnoses  COPD with acute exacerbation (HCC)    -  Primary   Relevant Medications   levofloxacin (LEVAQUIN) 500 MG tablet   predniSONE (DELTASONE) 20 MG tablet   chlorpheniramine-HYDROcodone (TUSSIONEX) 10-8 MG/5ML   Oral yeast infection       Relevant Medications   fluconazole  (DIFLUCAN) 150 MG tablet   Bilateral hearing loss, unspecified hearing loss type       Relevant Orders   Ambulatory referral to Audiology       AECOPD Last flare 10/2022 Followed by Pulm Pulse ox 99% Failed Doxycycline last time  Start Levaquin x 7 days Diflucan re order 12 day prednisone taper, may stop the last 2 days if it has resolved. Cough syrup ordered, let me know if it is back order.  You should have Trelegy refills available, sent 08/2022, check with pharmacy.  Orders Placed This Encounter  Procedures   Ambulatory referral to Audiology    Referral Priority:   Routine    Referral Type:   Audiology Exam    Referral Reason:   Specialty Services Required    Number of Visits Requested:   1     Meds ordered this encounter  Medications   levofloxacin (LEVAQUIN) 500 MG tablet    Sig: Take 1 tablet (500 mg total) by mouth daily. For 7 days    Dispense:  7 tablet    Refill:  0   predniSONE (DELTASONE) 20 MG tablet    Sig: Take 2 tablets daily (21m) for 4 days, take 1 tab daily (275m for 4 days, take half tab daily (1094mfor 4 days    Dispense:  14 tablet    Refill:  0   chlorpheniramine-HYDROcodone (TUSSIONEX) 10-8 MG/5ML    Sig: Take 5 mLs by mouth every 12 (twelve) hours as needed for cough.    Dispense:  140 mL    Refill:  0   fluconazole (DIFLUCAN) 150 MG tablet    Sig: Take 1 tablet (150 mg total) by mouth every other day. Until resolved. For yeast infection.    Dispense:  6 tablet    Refill:  3    Follow up plan: Return if symptoms worsen or fail to improve.   AleNobie PutnamO [facility] 01/29/2023, 3:44 PM

## 2023-03-03 ENCOUNTER — Other Ambulatory Visit: Payer: Self-pay | Admitting: Family Medicine

## 2023-03-03 DIAGNOSIS — I10 Essential (primary) hypertension: Secondary | ICD-10-CM

## 2023-03-03 NOTE — Telephone Encounter (Signed)
Medication Refill - Medication: Metoprolol 25 mg 24 hr tablet  Has the patient contacted their pharmacy? Yes.   (Agent: If no, request that the patient contact the pharmacy for the refill. If patient does not wish to contact the pharmacy document the reason why and proceed with request.) (Agent: If yes, when and what did the pharmacy advise?)  Preferred Pharmacy (with phone number or street name): [facility] Has the patient been seen for an appointment in the last year OR does the patient have an upcoming appointment? Yes.    Agent: Please be advised that RX refills may take up to 3 business days. We ask that you follow-up with your pharmacy.

## 2023-03-04 MED ORDER — METOPROLOL SUCCINATE ER 25 MG PO TB24: 12.5000 mg | ORAL_TABLET | Freq: Every day | ORAL | 3 refills | Status: DC

## 2023-03-04 NOTE — Telephone Encounter (Signed)
Requested medication (s) are due for refill today: expired medication  Requested medication (s) are on the active medication list: yes   Last refill:   01/17/22-01/12/23 #45 3 refills   Future visit scheduled: no  Notes to clinic:  expired medication. Last ordered by [name], MD. Do you want to order Rx?     Requested Prescriptions  Pending Prescriptions Disp Refills   metoprolol succinate (TOPROL-XL) 25 MG 24 hr tablet 45 tablet 3    Sig: Take 0.5 tablets (12.5 mg total) by mouth daily.     Cardiovascular:  Beta Blockers Passed - 03/03/2023  5:31 PM      Passed - Last BP in normal range    BP Readings from Last 1 Encounters:  01/29/23 108/64         Passed - Last Heart Rate in normal range    Pulse Readings from Last 1 Encounters:  01/29/23 95         Passed - Valid encounter within last 6 months    Recent Outpatient Visits           1 month ago COPD with acute exacerbation [facility])   [place], DO   3 months ago COPD with acute exacerbation [facility])   [place], DO   6 months ago COPD with acute exacerbation [facility])   [place], DO   8 months ago COPD with acute exacerbation [facility])   [place], [place]   11 months ago Right inguinal hernia   [place], [place]

## 2023-03-18 DIAGNOSIS — Z5181 Encounter for therapeutic drug level monitoring: Secondary | ICD-10-CM | POA: Diagnosis not present

## 2023-03-18 DIAGNOSIS — M25512 Pain in left shoulder: Secondary | ICD-10-CM | POA: Diagnosis not present

## 2023-03-18 DIAGNOSIS — M5451 Vertebrogenic low back pain: Secondary | ICD-10-CM | POA: Diagnosis not present

## 2023-03-18 DIAGNOSIS — M791 Myalgia, unspecified site: Secondary | ICD-10-CM | POA: Diagnosis not present

## 2023-03-18 DIAGNOSIS — Z79899 Other long term (current) drug therapy: Secondary | ICD-10-CM | POA: Diagnosis not present

## 2023-03-21 ENCOUNTER — Ambulatory Visit (INDEPENDENT_AMBULATORY_CARE_PROVIDER_SITE_OTHER): Payer: Medicare HMO

## 2023-03-21 VITALS — BP 98/58 | Ht 66.0 in | Wt 132.8 lb

## 2023-03-21 DIAGNOSIS — Z0000 Encounter for general adult medical examination without abnormal findings: Secondary | ICD-10-CM | POA: Diagnosis not present

## 2023-03-21 DIAGNOSIS — Z780 Asymptomatic menopausal state: Secondary | ICD-10-CM

## 2023-03-21 DIAGNOSIS — Z1231 Encounter for screening mammogram for malignant neoplasm of breast: Secondary | ICD-10-CM

## 2023-03-21 NOTE — Progress Notes (Signed)
Subjective:   Caroline Conley is a 57 y.o. female who presents for Medicare Annual (Subsequent) preventive examination.  Review of Systems     Cardiac Risk Factors include: advanced age (>61men, >53 women);dyslipidemia;smoking/ tobacco exposure     Objective:    Today's Vitals   03/21/23 1536  BP: (!) 98/58  Weight: 132 lb 12.8 oz (60.2 kg)  Height: 5\' 6"  (1.676 m)  PainSc: 5    Body mass index is 21.43 kg/m.     03/21/2023    3:42 PM 10/25/2022    8:48 AM 04/17/2022   10:13 AM 04/10/2022   12:14 PM 01/20/2022    7:37 PM 01/20/2022    4:54 PM 01/20/2022   10:13 AM  Advanced Directives  Does Patient Have a Medical Advance Directive? No No No No No  No  Would patient like information on creating a medical advance directive? Yes (MAU/Ambulatory/Procedural Areas - Information given) No - Patient declined No - Patient declined  No - Patient declined No - Patient declined     Current Medications (verified) Outpatient Encounter Medications as of 03/21/2023  Medication Sig   albuterol (PROAIR HFA) 108 (90 Base) MCG/ACT inhaler Inhale 2 puffs into the lungs every 4 (four) hours as needed for wheezing or shortness of breath.   albuterol (PROVENTIL) (2.5 MG/3ML) 0.083% nebulizer solution Take 3 mLs (2.5 mg total) by nebulization every 4 (four) hours as needed for shortness of breath or wheezing.   chlorpheniramine-HYDROcodone (TUSSIONEX) 10-8 MG/5ML Take 5 mLs by mouth every 12 (twelve) hours as needed for cough.   fluconazole (DIFLUCAN) 150 MG tablet Take 1 tablet (150 mg total) by mouth every other day. Until resolved. For yeast infection.   magic mouthwash w/lidocaine SOLN Swish, gargle, and spit one to two teaspoonfuls every six hours as needed. Shake well before using.   metoprolol succinate (TOPROL-XL) 25 MG 24 hr tablet Take 0.5 tablets (12.5 mg total) by mouth daily.   Misc. Devices (PULSE OXIMETER FOR FINGER) MISC 1 Device by Does not apply route as needed (for cough, dyspnea). Pulse  oximeter for finger for checking oxygen saturation in COPD   omeprazole (PRILOSEC) 40 MG capsule Take 1 capsule (40 mg total) by mouth daily before breakfast.   oxyCODONE-acetaminophen (PERCOCET) 10-325 MG tablet Take 1 tablet by mouth every 4 (four) hours.   promethazine (PHENERGAN) 25 MG tablet TAKE 1/2-1 TABLET BY MOUTH EVERY 8 HOURSAS NEEDED FOR NAUSEA OR VOMITING   roflumilast (DALIRESP) 500 MCG TABS tablet Take 1 tablet (500 mcg total) by mouth daily.   silver sulfADIAZINE (SILVADENE) 1 % cream Apply 1 Application topically daily. For 1-2 weeks then as needed   tiZANidine (ZANAFLEX) 4 MG tablet Take 1 tablet (4 mg total) by mouth every 8 (eight) hours as needed for muscle spasms.   TRELEGY ELLIPTA 100-62.5-25 MCG/ACT AEPB Inhale 1 puff into the lungs daily.   acetaminophen (TYLENOL) 500 MG tablet Take 2 tablets (1,000 mg total) by mouth every 8 (eight) hours. (Patient not taking: Reported on 03/21/2023)   levofloxacin (LEVAQUIN) 500 MG tablet Take 1 tablet (500 mg total) by mouth daily. For 7 days (Patient not taking: Reported on 03/21/2023)   predniSONE (DELTASONE) 20 MG tablet Take 2 tablets daily (40mg ) for 4 days, take 1 tab daily (20mg ) for 4 days, take half tab daily (10mg ) for 4 days (Patient not taking: Reported on 03/21/2023)   No facility-administered encounter medications on file as of 03/21/2023.    Allergies (verified) Hydrocodone-acetaminophen, Vicodin [  hydrocodone-acetaminophen], Morphine, and Morphine and related   History: Past Medical History:  Diagnosis Date   Atypical chest pain    a. 2015 Cath ([facility]): Nl cors. Nl EF; b. 10/2018 MV: No ischemia/infarct. EF>65%.   COPD (chronic obstructive pulmonary disease)    Coronary vasospasm    a. 06/2021 Cath: LM nl, LADnl, LCX large/nl, RCA 35p (spasm-->tx w/ IC NTG w/ resultant VF req defib).   Dysplastic nevus 02/02/2015   R upper back - excision   Dysplastic nevus 02/16/2015   L hip - excision   Dysplastic nevus 08/24/2015    L upper abdomen   Dysplastic nevus 08/11/2017   mid abdomen - excised   Dysplastic nevus 07/29/2017   Left neck   Full dentures    Gastritis    GERD (gastroesophageal reflux disease)    HFrEF (heart failure with reduced ejection fraction)    Hyperlipidemia    Iatrogenic ventricular fibrillation 07/02/2021   a.) during LHC --> coronary vasospasm Tx'd with IC NTG resulting in VF requiring defibrillation; no recurrence.   LBBB (left bundle branch block)    Melanoma 08/24/2015   L buttocks - Breslow depth 0.64mm, Ulceration: absent, Mitotic index: 1 per HPF, TILs: Non-brisk, SLNbx: negative   NICM (nonischemic cardiomyopathy)    a.) 08/04/2018: EF 40%; ant/anterosepal HK, GLS -13.7; mild-mod MR; G1DD.  b.) 06/22/2021 TTE: EF 40%, ant/antseptal/septal HK. G1DD. Nl RV fxn. Mild-mod MR; c.) 06/2021 LHC: Nl cors w/ RCA vasospasm (35%). VF w/ IC NTG admin req defib; d.) 07/02/2021 TTE: EF 35-40%. Glob HK. Mild MR. e.) TTE 02/07/2022: EF 35-40%; basal mid-anteroseptal AK; mild MR; G2DD.   Occlusion of right radial artery    a. 06/2021 s/p cardiac cath-->u/s w/ R radial occlusion from puncture extending up entire forearm-->Lovenox /kg BID x 4 wks; b. 07/2021 U/S: Thrombus in prox Radial artery w/ minimal reconstitution of flow - improved w/ multiphasic waveforms in R brachial artery.   Palpitations    a. 07/2018 48h Holter: predominantly RSR, avg rate 106 (69-166 bpm), Rare PAC's and occas PVC's. 2 atrial runs up to 5 beats. No sustained arrhythmias or prolonged pauses. No symptoms reported; b. 07/2021 Zio: Avg HR 102 bpm, intermittent bundle branch block, rare PACs and PVCs, 9 atrial runs, longest 13.7 secs w/ max HR of 226 bpm.   Skin cancer, basal cell 01/2016   Wears dentures    full upper and lower   Past Surgical History:  Procedure Laterality Date   ABDOMINAL HYSTERECTOMY     APPENDECTOMY     BACK SURGERY     repeat lumbar herniated disc; apply cage   BUNIONECTOMY Right    CARDIAC  CATHETERIZATION  09/20/2014   [facility] - Dr. Juliann Pares   CHOLECYSTECTOMY     COLONOSCOPY WITH PROPOFOL N/A 07/08/2016   Procedure: COLONOSCOPY WITH PROPOFOL;  Surgeon: Midge Minium, MD;  Location: [facility];  Service: Endoscopy;  Laterality: N/A;   DEBRIDEMENT TENNIS ELBOW     ESOPHAGOGASTRODUODENOSCOPY  09/16/2014   HARDWARE REMOVAL Left 01/04/2022   Procedure: HARDWARE REMOVAL;  Surgeon: Linus Galas, DPM;  Location: [facility];  Service: Podiatry;  Laterality: Left;   INGUINAL HERNIA REPAIR Right 04/17/2022   Procedure: HERNIA REPAIR INGUINAL ADULT;  Surgeon: Earline Mayotte, MD;  Location: [facility];  Service: General;  Laterality: Right;  open   LEFT HEART CATH AND CORONARY ANGIOGRAPHY N/A 07/02/2021   Procedure: LEFT HEART CATH AND CORONARY ANGIOGRAPHY;  Surgeon: Yvonne Kendall, MD;  Location: [facility];  Service: Cardiovascular;  Laterality: N/A;   LUMBAR DISC SURGERY     herniated disc   LUMBAR DISC SURGERY     took screws out from previous lumbar disc surgery   METATARSAL OSTEOTOMY  05/18/2013   ROTATOR CUFF REPAIR Right    SHOULDER ARTHROSCOPY Right 10/25/2022   Procedure: Right shoulder arthroscopic mini open rotator cuff repair, subacromial decompression, and distal clavicle excision;  Surgeon: Signa KellPatel, Sunny, MD;  [facility];  Service: Orthopedics;  Laterality: Right;   TONSILLECTOMY     Family History  Problem Relation Age of Onset   Diabetes Mother    High blood pressure Mother    Gout Mother    Cancer Father    Stroke Father    Arthritis/Rheumatoid Father    Diabetes Father    High blood pressure Father    Heart failure Father    Heart failure Brother    Diabetes Brother    Social History   Socioeconomic History   Marital status: Married    Spouse name: Deniece PortelaWayne   Number of children: 2   Years of education: Not on file   Highest education level: Not on file  Occupational History   Occupation: disability  Tobacco Use   Smoking  status: Former    Packs/day: 1.00    Years: 30.00    Additional pack years: 0.00    Total pack years: 30.00    Types: Cigarettes    Quit date: 03/02/2018    Years since quitting: 5.0   Smokeless tobacco: Never   Tobacco comments:    0.5 to 1 ppd >30 years  Vaping Use   Vaping Use: Never used  Substance and Sexual Activity   Alcohol use: No   Drug use: Yes    Types: Oxycodone   Sexual activity: Yes  Other Topics Concern   Not on file  Social History Narrative   Not on file   Social Determinants of Health   Financial Resource Strain: Low Risk  (03/21/2023)   Overall Financial Resource Strain (CARDIA)    Difficulty of Paying Living Expenses: Not hard at all  Food Insecurity: No Food Insecurity (03/21/2023)   Hunger Vital Sign    Worried About Running Out of Food in the Last Year: Never true    Ran Out of Food in the Last Year: Never true  Transportation Needs: No Transportation Needs (03/21/2023)   PRAPARE - Administrator, Civil ServiceTransportation    Lack of Transportation (Medical): No    Lack of Transportation (Non-Medical): No  Physical Activity: Sufficiently Active (03/21/2023)   Exercise Vital Sign    Days of Exercise per Week: 7 days    Minutes of Exercise per Session: 30 min  Stress: No Stress Concern Present (03/21/2023)   [employer] - Occupational Stress Questionnaire    Feeling of Stress : Not at all  Social Connections: Moderately Integrated (03/21/2023)   Social Connection and Isolation Panel [NHANES]    Frequency of Communication with Friends and Family: More than three times a week    Frequency of Social Gatherings with Friends and Family: More than three times a week    Attends Religious Services: More than 4 times per year    Active Member of [employer] or Organizations: No    Attends BankerClub or Organization Meetings: Never    Marital Status: Married    Tobacco Counseling Counseling given: Not Answered Tobacco comments: 0.5 to 1 ppd >30 years   Clinical  Intake:  Pre-visit preparation completed: Yes  Pain : 0-10 Pain Score: 5  Pain Type: Chronic pain Pain Location: Back Pain Radiating Towards: shoulder (R)     Nutritional Risks: None Diabetes: No  How often do you need to have someone help you when you read instructions, pamphlets, or other written materials from your doctor or pharmacy?: 1 - Never  Diabetic?no  Interpreter Needed?: No  Information entered by :: Kennedy Bucker, LPN   Activities of Daily Living    03/21/2023    3:44 PM 01/29/2023    3:57 PM  In your present state of health, do you have any difficulty performing the following activities:  Hearing? 1 1  Vision? 0 1  Difficulty concentrating or making decisions? 0 0  Walking or climbing stairs? 0 0  Dressing or bathing? 0 0  Doing errands, shopping? 0 0  Preparing Food and eating ? N   Using the Toilet? N   In the past six months, have you accidently leaked urine? N   Do you have problems with loss of bowel control? N   Managing your Medications? N   Managing your Finances? N   Housekeeping or managing your Housekeeping? N     Patient Care Team: Smitty Cords, DO as PCP - General (Family Medicine) End, Cristal Deer, MD as PCP - Cardiology (Cardiology)  Indicate any recent Medical Services you may have received from other than [facility] providers in the past year (date may be approximate).     Assessment:   This is a routine wellness examination for [place].  Hearing/Vision screen Hearing Screening - Comments:: No aids Vision Screening - Comments:: No glasses  Dietary issues and exercise activities discussed: Current Exercise Habits: Home exercise routine, Type of exercise: walking, Time (Minutes): 30, Frequency (Times/Week): 7, Weekly Exercise (Minutes/Week): 210, Intensity: Mild   Goals Addressed             This Visit's Progress    Patient Stated       I have a good plan!       Depression Screen    03/21/2023    3:41 PM  01/29/2023    3:57 PM 08/20/2022    3:22 PM 06/27/2022    1:10 PM 03/14/2022   11:22 AM 01/24/2022    9:43 AM 12/19/2020    1:40 PM  PHQ 2/9 Scores  PHQ - 2 Score 0 0 0 0 0 0 0  PHQ- 9 Score 0 0 0 0 0 0     Fall Risk    03/21/2023    3:44 PM 01/29/2023    3:57 PM 08/20/2022    3:22 PM 06/27/2022    1:10 PM 03/14/2022   11:27 AM  Fall Risk   Falls in the past year? 0 0 0 0 0  Number falls in past yr: 0 0 0 0 0  Injury with Fall? 0 0 0 0 0  Risk for fall due to : No Fall Risks No Fall Risks No Fall Risks No Fall Risks No Fall Risks  Follow up Falls prevention discussed;Falls evaluation completed Falls evaluation completed Falls evaluation completed Falls evaluation completed Falls evaluation completed    FALL RISK PREVENTION PERTAINING TO THE HOME:  Any stairs in or around the home? Yes  If so, are there any without handrails? No  Home free of loose throw rugs in walkways, pet beds, electrical cords, etc? Yes  Adequate lighting in your home to reduce risk of falls? Yes   ASSISTIVE DEVICES  UTILIZED TO PREVENT FALLS:  Life alert? No  Use of a cane, walker or w/c? No  Grab bars in the bathroom? Yes  Shower chair or bench in shower? No  Elevated toilet seat or a handicapped toilet? No   TIMED UP AND GO:  Was the test performed? Yes .  Length of time to ambulate 10 feet: 4 sec.   Gait steady and fast without use of assistive device  Cognitive Function:        03/21/2023    3:52 PM 03/14/2022   11:29 AM 12/19/2020    1:41 PM  6CIT Screen  What Year? 0 points 0 points 0 points  What month? 0 points 0 points 0 points  What time? 0 points 0 points 3 points  Count back from 20 0 points 0 points 0 points  Months in reverse 0 points 0 points 0 points  Repeat phrase 2 points 0 points 6 points  Total Score 2 points 0 points 9 points    Immunizations Immunization History  Administered Date(s) Administered   Influenza Inj Mdck Quad With Preservative 10/22/2019   Influenza,inj,Quad  PF,6+ Mos 08/29/2016, 08/06/2017, 10/20/2018   Influenza,inj,quad, With Preservative 09/15/2017, 09/16/2019   Influenza-Unspecified 07/26/2022   PFIZER Comirnaty(Gray Top)Covid-19 Tri-Sucrose Vaccine 03/14/2020, 04/04/2020   PFIZER(Purple Top)SARS-COV-2 Vaccination 03/14/2020, 04/04/2020   PNEUMOCOCCAL CONJUGATE-20 07/26/2022   Pneumococcal-Unspecified 09/15/2017   Tdap 08/29/2016, 03/09/2017   Unspecified SARS-COV-2 Vaccination 04/04/2020    TDAP status: Up to date  Flu Vaccine status: Up to date  Pneumococcal vaccine status: Up to date  Covid-19 vaccine status: Completed vaccines  Qualifies for Shingles Vaccine? Yes   Zostavax completed No   Shingrix Completed?: No.    Education has been provided regarding the importance of this vaccine. Patient has been advised to call insurance company to determine out of pocket expense if they have not yet received this vaccine. Advised may also receive vaccine at local pharmacy or Health Dept. Verbalized acceptance and understanding.  Screening Tests Health Maintenance  Topic Date Due   Hepatitis C Screening  Never done   Zoster Vaccines- Shingrix (1 of 2) Never done   COVID-19 Vaccine (6 - 2023-24 season) 08/16/2022   INFLUENZA VACCINE  07/17/2023   MAMMOGRAM  10/11/2023   Lung Cancer Screening  10/29/2023   Medicare Annual Wellness (AWV)  03/20/2024   COLONOSCOPY (Pts 45-26yrs Insurance coverage will need to be confirmed)  07/08/2026   DTaP/Tdap/Td (3 - Td or Tdap) 03/10/2027   HIV Screening  Completed   HPV VACCINES  Aged Out   PAP SMEAR-Modifier  Discontinued    Health Maintenance  Health Maintenance Due  Topic Date Due   Hepatitis C Screening  Never done   Zoster Vaccines- Shingrix (1 of 2) Never done   COVID-19 Vaccine (6 - 2023-24 season) 08/16/2022    Colorectal cancer screening: Type of screening: Colonoscopy. Completed 07/08/16. Repeat every 10 years  Mammogram status: Ordered 03/21/23. Pt provided with contact info and  advised to call to schedule appt.   Bone Density status: Ordered 03/21/23. Pt provided with contact info and advised to call to schedule appt.  Lung Cancer Screening: (Low Dose CT Chest recommended if Age 64-80 years, 30 pack-year currently smoking OR have quit w/in 15years.) does qualify.   Lung Cancer Screening Referral: jad one on 10/28/22  Additional Screening:  Hepatitis C Screening: does qualify; Completed no  Vision Screening: Recommended annual ophthalmology exams for early detection of glaucoma and other disorders of the eye.  Is the patient up to date with their annual eye exam?  No  Who is the provider or what is the name of the office in which the patient attends annual eye exams? No one If pt is not established with a provider, would they like to be referred to a provider to establish care? No .   Dental Screening: Recommended annual dental exams for proper oral hygiene  Community Resource Referral / Chronic Care Management: CRR required this visit?  No   CCM required this visit?  No      Plan:     I have personally reviewed and noted the following in the patient's chart:   Medical and social history Use of alcohol, tobacco or illicit drugs  Current medications and supplements including opioid prescriptions. Patient is currently taking opioid prescriptions. Information provided to patient regarding non-opioid alternatives. Patient advised to discuss non-opioid treatment plan with their provider. Functional ability and status Nutritional status Physical activity Advanced directives List of other physicians Hospitalizations, surgeries, and ER visits in previous 12 months Vitals Screenings to include cognitive, depression, and falls Referrals and appointments  In addition, I have reviewed and discussed with patient certain preventive protocols, quality metrics, and best practice recommendations. A written personalized care plan for preventive services as well as  general preventive health recommendations were provided to patient.     Hal Hope, LPN   03/24/2009   Nurse Notes: none

## 2023-03-21 NOTE — Patient Instructions (Addendum)
Caroline Conley , Thank you for taking time to come for your Medicare Wellness Visit. I appreciate your ongoing commitment to your health goals. Please review the following plan we discussed and let me know if I can assist you in the future.   These are the goals we discussed:  Goals      Patient Stated     12/19/2020, stay alive     Patient Stated     I have a good plan!     Quit smoking / using tobacco        This is a list of the screening recommended for you and due dates:  Health Maintenance  Topic Date Due   Hepatitis C Screening: USPSTF Recommendation to screen - Ages 90-79 yo.  Never done   Zoster (Shingles) Vaccine (1 of 2) Never done   COVID-19 Vaccine (6 - 2023-24 season) 08/16/2022   Flu Shot  07/17/2023   Mammogram  10/11/2023   Screening for Lung Cancer  10/29/2023   Medicare Annual Wellness Visit  03/20/2024   Colon Cancer Screening  07/08/2026   DTaP/Tdap/Td vaccine (3 - Td or Tdap) 03/10/2027   HIV Screening  Completed   HPV Vaccine  Aged Out   Pap Smear  Discontinued    Advanced directives: no  Conditions/risks identified: none  Next appointment: Follow up in one year for your annual wellness visit. 03/26/24 @ 1:30 pm in person  Preventive Care 40-64 Years, Female Preventive care refers to lifestyle choices and visits with your health care provider that can promote health and wellness. What does preventive care include? A yearly physical exam. This is also called an annual well check. Dental exams once or twice a year. Routine eye exams. Ask your health care provider how often you should have your eyes checked. Personal lifestyle choices, including: Daily care of your teeth and gums. Regular physical activity. Eating a healthy diet. Avoiding tobacco and drug use. Limiting alcohol use. Practicing safe sex. Taking low-dose aspirin daily starting at age 50. Taking vitamin and mineral supplements as recommended by your health care provider. What happens  during an annual well check? The services and screenings done by your health care provider during your annual well check will depend on your age, overall health, lifestyle risk factors, and family history of disease. Counseling  Your health care provider may ask you questions about your: Alcohol use. Tobacco use. Drug use. Emotional well-being. Home and relationship well-being. Sexual activity. Eating habits. Work and work Astronomer. Method of birth control. Menstrual cycle. Pregnancy history. Screening  You may have the following tests or measurements: Height, weight, and BMI. Blood pressure. Lipid and cholesterol levels. These may be checked every 5 years, or more frequently if you are over 64 years old. Skin check. Lung cancer screening. You may have this screening every year starting at age 79 if you have a 30-pack-year history of smoking and currently smoke or have quit within the past 15 years. Fecal occult blood test (FOBT) of the stool. You may have this test every year starting at age 56. Flexible sigmoidoscopy or colonoscopy. You may have a sigmoidoscopy every 5 years or a colonoscopy every 10 years starting at age 56. Hepatitis C blood test. Hepatitis B blood test. Sexually transmitted disease (STD) testing. Diabetes screening. This is done by checking your blood sugar (glucose) after you have not eaten for a while (fasting). You may have this done every 1-3 years. Mammogram. This may be done every 1-2 years. Talk  to your health care provider about when you should start having regular mammograms. This may depend on whether you have a family history of breast cancer. BRCA-related cancer screening. This may be done if you have a family history of breast, ovarian, tubal, or peritoneal cancers. Pelvic exam and Pap test. This may be done every 3 years starting at age 58. Starting at age 58, this may be done every 5 years if you have a Pap test in combination with an HPV  test. Bone density scan. This is done to screen for osteoporosis. You may have this scan if you are at high risk for osteoporosis. Discuss your test results, treatment options, and if necessary, the need for more tests with your health care provider. Vaccines  Your health care provider may recommend certain vaccines, such as: Influenza vaccine. This is recommended every year. Tetanus, diphtheria, and acellular pertussis (Tdap, Td) vaccine. You may need a Td booster every 10 years. Zoster vaccine. You may need this after age 58. Pneumococcal 13-valent conjugate (PCV13) vaccine. You may need this if you have certain conditions and were not previously vaccinated. Pneumococcal polysaccharide (PPSV23) vaccine. You may need one or two doses if you smoke cigarettes or if you have certain conditions. Talk to your health care provider about which screenings and vaccines you need and how often you need them. This information is not intended to replace advice given to you by your health care provider. Make sure you discuss any questions you have with your health care provider. Document Released: 12/29/2015 Document Revised: 08/21/2016 Document Reviewed: 10/03/2015 Elsevier Interactive Patient Education  2017 [employer]    Fall Prevention in the Home Falls can cause injuries. They can happen to people of all ages. There are many things you can do to make your home safe and to help prevent falls. What can I do on the outside of my home? Regularly fix the edges of walkways and driveways and fix any cracks. Remove anything that might make you trip as you walk through a door, such as a raised step or threshold. Trim any bushes or trees on the path to your home. Use bright outdoor lighting. Clear any walking paths of anything that might make someone trip, such as rocks or tools. Regularly check to see if handrails are loose or broken. Make sure that both sides of any steps have handrails. Any raised  decks and porches should have guardrails on the edges. Have any leaves, snow, or ice cleared regularly. Use sand or salt on walking paths during winter. Clean up any spills in your garage right away. This includes oil or grease spills. What can I do in the bathroom? Use night lights. Install grab bars by the toilet and in the tub and shower. Do not use towel bars as grab bars. Use non-skid mats or decals in the tub or shower. If you need to sit down in the shower, use a plastic, non-slip stool. Keep the floor dry. Clean up any water that spills on the floor as soon as it happens. Remove soap buildup in the tub or shower regularly. Attach bath mats securely with double-sided non-slip rug tape. Do not have throw rugs and other things on the floor that can make you trip. What can I do in the bedroom? Use night lights. Make sure that you have a light by your bed that is easy to reach. Do not use any sheets or blankets that are too big for your bed. They should not  hang down onto the floor. Have a firm chair that has side arms. You can use this for support while you get dressed. Do not have throw rugs and other things on the floor that can make you trip. What can I do in the kitchen? Clean up any spills right away. Avoid walking on wet floors. Keep items that you use a lot in easy-to-reach places. If you need to reach something above you, use a strong step stool that has a grab bar. Keep electrical cords out of the way. Do not use floor polish or wax that makes floors slippery. If you must use wax, use non-skid floor wax. Do not have throw rugs and other things on the floor that can make you trip. What can I do with my stairs? Do not leave any items on the stairs. Make sure that there are handrails on both sides of the stairs and use them. Fix handrails that are broken or loose. Make sure that handrails are as long as the stairways. Check any carpeting to make sure that it is firmly attached  to the stairs. Fix any carpet that is loose or worn. Avoid having throw rugs at the top or bottom of the stairs. If you do have throw rugs, attach them to the floor with carpet tape. Make sure that you have a light switch at the top of the stairs and the bottom of the stairs. If you do not have them, ask someone to add them for you. What else can I do to help prevent falls? Wear shoes that: Do not have high heels. Have rubber bottoms. Are comfortable and fit you well. Are closed at the toe. Do not wear sandals. If you use a stepladder: Make sure that it is fully opened. Do not climb a closed stepladder. Make sure that both sides of the stepladder are locked into place. Ask someone to hold it for you, if possible. Clearly mark and make sure that you can see: Any grab bars or handrails. First and last steps. Where the edge of each step is. Use tools that help you move around (mobility aids) if they are needed. These include: Canes. Walkers. Scooters. Crutches. Turn on the lights when you go into a dark area. Replace any light bulbs as soon as they burn out. Set up your furniture so you have a clear path. Avoid moving your furniture around. If any of your floors are uneven, fix them. If there are any pets around you, be aware of where they are. Review your medicines with your doctor. Some medicines can make you feel dizzy. This can increase your chance of falling. Ask your doctor what other things that you can do to help prevent falls. This information is not intended to replace advice given to you by your health care provider. Make sure you discuss any questions you have with your health care provider. Document Released: 09/28/2009 Document Revised: 05/09/2016 Document Reviewed: 01/06/2015 Elsevier Interactive Patient Education  2017 [employer].

## 2023-03-27 DIAGNOSIS — J069 Acute upper respiratory infection, unspecified: Secondary | ICD-10-CM | POA: Diagnosis not present

## 2023-03-27 DIAGNOSIS — J441 Chronic obstructive pulmonary disease with (acute) exacerbation: Secondary | ICD-10-CM | POA: Diagnosis not present

## 2023-04-02 ENCOUNTER — Other Ambulatory Visit: Payer: Self-pay | Admitting: Family Medicine

## 2023-04-02 DIAGNOSIS — J441 Chronic obstructive pulmonary disease with (acute) exacerbation: Secondary | ICD-10-CM

## 2023-04-03 ENCOUNTER — Ambulatory Visit (INDEPENDENT_AMBULATORY_CARE_PROVIDER_SITE_OTHER): Payer: Medicare HMO | Admitting: Family Medicine

## 2023-04-03 ENCOUNTER — Encounter: Payer: Self-pay | Admitting: Family Medicine

## 2023-04-03 VITALS — BP 131/80 | HR 111 | Temp 98.1°F | Ht 66.0 in | Wt 129.0 lb

## 2023-04-03 DIAGNOSIS — J441 Chronic obstructive pulmonary disease with (acute) exacerbation: Secondary | ICD-10-CM

## 2023-04-03 DIAGNOSIS — B370 Candidal stomatitis: Secondary | ICD-10-CM

## 2023-04-03 MED ORDER — HYDROCOD POLI-CHLORPHE POLI ER 10-8 MG/5ML PO SUER
5.0000 mL | Freq: Two times a day (BID) | ORAL | 0 refills | Status: DC | PRN
Start: 2023-04-03 — End: 2023-07-01

## 2023-04-03 MED ORDER — PREDNISONE 20 MG PO TABS: ORAL_TABLET | ORAL | 0 refills | Status: DC

## 2023-04-03 MED ORDER — MAGIC MOUTHWASH W/LIDOCAINE: ORAL | 0 refills | Status: DC

## 2023-04-03 MED ORDER — LEVOFLOXACIN 500 MG PO TABS: 500.0000 mg | ORAL_TABLET | Freq: Every day | ORAL | 0 refills | Status: DC

## 2023-04-03 NOTE — Telephone Encounter (Signed)
Requested medication (s) are due for refill today: yes  Requested medication (s) are on the active medication list: yes  Last refill:  08/20/22  Future visit scheduled: yes  Notes to clinic:  Medication not assigned to a protocol, review manually.      Requested Prescriptions  Pending Prescriptions Disp Refills   TRELEGY ELLIPTA 100-62.5-25 MCG/ACT AEPB [Pharmacy Med Name: TRELEGY ELLIPTA 100-62.5-25] 60 each 11    Sig: INHALE 1 PUFF BY MOUTH DAILY RINSE MOUTH AFTER USE     Off-Protocol Failed - 04/02/2023 10:20 AM      Failed - Medication not assigned to a protocol, review manually.      Passed - Valid encounter within last 12 months    Recent Outpatient Visits           2 months ago COPD with acute exacerbation [facility])   [place] [facility] [place], Netta Neat, DO   4 months ago COPD with acute exacerbation [facility])   [place] [facility] Smitty Cords, DO   7 months ago COPD with acute exacerbation [facility])   [place] [facility] Smitty Cords, DO   9 months ago COPD with acute exacerbation [facility])   [place] [facility] Smitty Cords, DO   1 year ago Right inguinal hernia   [place] [facility] [place], Netta Neat, DO       Future Appointments             Today Althea Charon, Netta Neat, DO [facility], [facility]

## 2023-04-03 NOTE — Progress Notes (Signed)
Subjective:    Patient ID: Caroline Conley, female    DOB: [birth year], 57 y.o.   MRN: [id number]  Caroline Conley is a 57 y.o. female presenting on 04/03/2023 for COPD   HPI  Acute COPD Exacerbation  Last seen at Urgent Care 1 week ago given Levaquin 5 days and few days prednisone No fevers Thicker secretions Temporary improvement, but did not resolve  Worsening again symptoms productive cough thicker sputum with color change. Similar to previous flares   Continues Trelegy   Admits cough, shortness of breath  Needs re order cough syrup.   Using nebulizer   Denies any known or suspected exposure to person with or possibly with COVID19.        04/03/2023    9:25 AM 03/21/2023    3:41 PM 01/29/2023    3:57 PM  Depression screen PHQ 2/9  Decreased Interest 0 0 0  Down, Depressed, Hopeless 0 0 0  PHQ - 2 Score 0 0 0  Altered sleeping 0 0 0  Tired, decreased energy 0 0 0  Change in appetite 0 0 0  Feeling bad or failure about yourself  0 0 0  Trouble concentrating 0 0 0  Moving slowly or fidgety/restless 0 0 0  Suicidal thoughts 0 0 0  PHQ-9 Score 0 0 0  Difficult doing work/chores  Not difficult at all Not difficult at all    Social History   Tobacco Use   Smoking status: Former    Packs/day: 1.00    Years: 30.00    Additional pack years: 0.00    Total pack years: 30.00    Types: Cigarettes    Quit date: 03/02/2018    Years since quitting: 5.0   Smokeless tobacco: Never   Tobacco comments:    0.5 to 1 ppd >30 years  Vaping Use   Vaping Use: Never used  Substance Use Topics   Alcohol use: No   Drug use: Yes    Types: Oxycodone    Review of Systems Per HPI unless specifically indicated above     Objective:    BP 131/80   Pulse (!) 111   Temp 98.1 F (36.7 C) (Oral)   Ht  (1.676 m)   Wt 129 lb (58.5 kg)   SpO2 96%   BMI 20.82 kg/m   Wt Readings from Last 3 Encounters:  04/03/23 129 lb (58.5 kg)  03/21/23 132 lb 12.8 oz (60.2 kg)   01/29/23 129 lb (58.5 kg)    Physical Exam Vitals and nursing note reviewed.  Constitutional:      General: She is not in acute distress.    Appearance: She is well-developed. She is not diaphoretic.     Comments: Well-appearing, comfortable, cooperative  HENT:     Head: Normocephalic and atraumatic.  Eyes:     General:        Right eye: No discharge.        Left eye: No discharge.     Conjunctiva/sclera: Conjunctivae normal.  Neck:     Thyroid: No thyromegaly.  Cardiovascular:     Rate and Rhythm: Normal rate and regular rhythm.     Heart sounds: Normal heart sounds. No murmur heard. Pulmonary:     Effort: Pulmonary effort is normal. No respiratory distress.     Breath sounds: Wheezing and rhonchi present. No rales.  Musculoskeletal:        General: Normal range of motion.     Cervical back:  Normal range of motion and neck supple.  Lymphadenopathy:     Cervical: No cervical adenopathy.  Skin:    General: Skin is warm and dry.     Findings: No erythema or rash.  Neurological:     Mental Status: She is alert and oriented to person, place, and time.  Psychiatric:        Behavior: Behavior normal.     Comments: Well groomed, good eye contact, normal speech and thoughts    Results for orders placed or performed during the hospital encounter of 10/28/22  Resp Panel by RT-PCR (Flu A&B, Covid) Urine, Clean Catch   Specimen: Urine, Clean Catch; Nasal Swab  Result Value Ref Range   SARS Coronavirus 2 by RT PCR POSITIVE (A) NEGATIVE   Influenza A by PCR NEGATIVE NEGATIVE   Influenza B by PCR NEGATIVE NEGATIVE  Lipase, blood  Result Value Ref Range   Lipase 31 11 - 51 U/L  Comprehensive metabolic panel  Result Value Ref Range   Sodium 138 135 - 145 mmol/L   Potassium 3.6 3.5 - 5.1 mmol/L   Chloride 104 98 - 111 mmol/L   CO2 24 22 - 32 mmol/L   Glucose, Bld 129 (H) 70 - 99 mg/dL   BUN 8 6 - 20 mg/dL   Creatinine, Ser 1.61 0.44 - 1.00 mg/dL   Calcium 9.5 8.9 - 09.6 mg/dL    Total Protein 7.2 6.5 - 8.1 g/dL   Albumin 3.5 3.5 - 5.0 g/dL   AST 77 (H) 15 - 41 U/L   ALT 30 0 - 44 U/L   Alkaline Phosphatase 150 (H) 38 - 126 U/L   Total Bilirubin 0.8 0.3 - 1.2 mg/dL   GFR, Estimated >04 >54 mL/min   Anion gap 10 5 - 15  CBC  Result Value Ref Range   WBC 11.1 (H) 4.0 - 10.5 K/uL   RBC 4.36 3.87 - 5.11 MIL/uL   Hemoglobin 15.0 12.0 - 15.0 g/dL   HCT 09.8 11.9 - 14.7 %   MCV 96.8 80.0 - 100.0 fL   MCH 34.4 (H) 26.0 - 34.0 pg   MCHC 35.5 30.0 - 36.0 g/dL   RDW 82.9 56.2 - 13.0 %   Platelets 277 150 - 400 K/uL   nRBC 0.0 0.0 - 0.2 %  Urinalysis, Routine w reflex microscopic Urine, Clean Catch  Result Value Ref Range   Color, Urine STRAW (A) YELLOW   APPearance CLEAR (A) CLEAR   Specific Gravity, Urine 1.008 1.005 - 1.030   pH 6.0 5.0 - 8.0   Glucose, UA NEGATIVE NEGATIVE mg/dL   Hgb urine dipstick MODERATE (A) NEGATIVE   Bilirubin Urine NEGATIVE NEGATIVE   Ketones, ur NEGATIVE NEGATIVE mg/dL   Protein, ur NEGATIVE NEGATIVE mg/dL   Nitrite NEGATIVE NEGATIVE   Leukocytes,Ua NEGATIVE NEGATIVE   RBC / HPF 0-5 0 - 5 RBC/hpf   WBC, UA 6-10 0 - 5 WBC/hpf   Bacteria, UA NONE SEEN NONE SEEN   Squamous Epithelial / HPF 0-5 0 - 5   Mucus PRESENT   Phosphorus  Result Value Ref Range   Phosphorus 2.7 2.5 - 4.6 mg/dL  Magnesium  Result Value Ref Range   Magnesium 1.8 1.7 - 2.4 mg/dL  Procalcitonin - Baseline  Result Value Ref Range   Procalcitonin <0.10 ng/mL      Assessment & Plan:   Problem List Items Addressed This Visit   None Visit Diagnoses     COPD with acute exacerbation    -  Primary   Relevant Medications   levofloxacin (LEVAQUIN) 500 MG tablet   predniSONE (DELTASONE) 20 MG tablet   chlorpheniramine-HYDROcodone (TUSSIONEX) 10-8 MG/5ML   magic mouthwash w/lidocaine SOLN   Oral yeast infection       Relevant Medications   magic mouthwash w/lidocaine SOLN       AECOPD Last flare 01/2023 Followed by Pulm Pulse ox 96% Failed  Doxycycline prior  Recent UC Visit, only short course on treatment.  Now second sickening   Start Levaquin x 7 days Diflucan re order 12 day prednisone taper, may stop the last 2 days if it has resolved. Re order Tussionex cough syrup   Meds ordered this encounter  Medications   levofloxacin (LEVAQUIN) 500 MG tablet    Sig: Take 1 tablet (500 mg total) by mouth daily. For 7 days    Dispense:  7 tablet    Refill:  0   predniSONE (DELTASONE) 20 MG tablet    Sig: Take 2 tablets daily (40mg ) for 4 days, take 1 tab daily (20mg ) for 4 days, take half tab daily (10mg ) for 4 days    Dispense:  14 tablet    Refill:  0   chlorpheniramine-HYDROcodone (TUSSIONEX) 10-8 MG/5ML    Sig: Take 5 mLs by mouth every 12 (twelve) hours as needed for cough.    Dispense:  140 mL    Refill:  0   magic mouthwash w/lidocaine SOLN    Sig: Swish, gargle, and spit one to two teaspoonfuls every six hours as needed. Shake well before using.    Dispense:  120 mL    Refill:  0    1 Part viscous lidocaine 2%  1 Part Maalox  1 Part diphenhydramine 12.5 mg per 5 ml elixir 1 Part Nystatin      Follow up plan: Return if symptoms worsen or fail to improve.   Saralyn Pilar, DO [facility] 04/03/2023, 9:38 AM

## 2023-04-03 NOTE — Patient Instructions (Addendum)
Thank you for coming to the office today.  Start Levaquin x 7 days   12 day prednisone taper, may stop the last 2 days if it has resolved.   Cough syrup ordered, let me know if it is back order.  Please schedule a Follow-up Appointment to: Return if symptoms worsen or fail to improve.  If you have any other questions or concerns, please feel free to call the office or send a message through MyChart. You may also schedule an earlier appointment if necessary.  Additionally, you may be receiving a survey about your experience at our office within a few days to 1 week by e-mail or mail. We value your feedback.  Saralyn Pilar, DO [facility], [place]

## 2023-04-07 NOTE — Progress Notes (Signed)
(  Key: [id number]) PA Case ID #: [id number] Rx #: [id number] Need Help? 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 03C

## 2023-04-07 NOTE — Progress Notes (Signed)
This request has received an Unfavorable outcome.  An eAppeal may be available. View the bottom of the request to see if an eAppeal is available along with any additional information provided by [facility].  The Medicare rule in the Prescription Drug Manual (Chapter 6, Section 20.1) says drugs used for the symptomatic relief of cough and colds are excluded from Medicare Part D coverage. [employer] follows Medicare rules. Your drug is used to relieve symptoms of cough and cold and per Medicare rules is not covered.

## 2023-04-21 ENCOUNTER — Other Ambulatory Visit: Payer: Self-pay | Admitting: Family Medicine

## 2023-04-21 DIAGNOSIS — K219 Gastro-esophageal reflux disease without esophagitis: Secondary | ICD-10-CM

## 2023-04-22 NOTE — Telephone Encounter (Signed)
Requested Prescriptions  Pending Prescriptions Disp Refills   omeprazole (PRILOSEC) 40 MG capsule [Pharmacy Med Name: OMEPRAZOLE DR 40 MG CAPSULE] 90 capsule 3    Sig: TAKE 1 CAPSULE BY MOUTH DAILY BEFORE BREAKFAST     Gastroenterology: Proton Pump Inhibitors Passed - 04/21/2023  9:58 AM      Passed - Valid encounter within last 12 months    Recent Outpatient Visits           2 weeks ago COPD with acute exacerbation [facility])   [place] [facility] [place], Netta Neat, DO   2 months ago COPD with acute exacerbation [facility])   [place] [facility] Smitty Cords, DO   5 months ago COPD with acute exacerbation [facility])   [place] [facility] Smitty Cords, DO   8 months ago COPD with acute exacerbation [facility])   [place] [facility] Smitty Cords, DO   9 months ago COPD with acute exacerbation [facility])   [place] [facility] [place], Netta Neat, [place]

## 2023-05-08 DIAGNOSIS — N8110 Cystocele, unspecified: Secondary | ICD-10-CM | POA: Diagnosis not present

## 2023-05-22 ENCOUNTER — Ambulatory Visit (INDEPENDENT_AMBULATORY_CARE_PROVIDER_SITE_OTHER): Payer: Medicare HMO | Admitting: Dermatology

## 2023-05-22 ENCOUNTER — Encounter: Payer: Self-pay | Admitting: Dermatology

## 2023-05-22 DIAGNOSIS — Z1283 Encounter for screening for malignant neoplasm of skin: Secondary | ICD-10-CM

## 2023-05-22 DIAGNOSIS — D2262 Melanocytic nevi of left upper limb, including shoulder: Secondary | ICD-10-CM | POA: Diagnosis not present

## 2023-05-22 DIAGNOSIS — L578 Other skin changes due to chronic exposure to nonionizing radiation: Secondary | ICD-10-CM | POA: Diagnosis not present

## 2023-05-22 DIAGNOSIS — B078 Other viral warts: Secondary | ICD-10-CM

## 2023-05-22 DIAGNOSIS — D225 Melanocytic nevi of trunk: Secondary | ICD-10-CM

## 2023-05-22 DIAGNOSIS — Z85820 Personal history of malignant melanoma of skin: Secondary | ICD-10-CM | POA: Diagnosis not present

## 2023-05-22 DIAGNOSIS — Z86018 Personal history of other benign neoplasm: Secondary | ICD-10-CM | POA: Diagnosis not present

## 2023-05-22 DIAGNOSIS — W908XXA Exposure to other nonionizing radiation, initial encounter: Secondary | ICD-10-CM

## 2023-05-22 DIAGNOSIS — X32XXXA Exposure to sunlight, initial encounter: Secondary | ICD-10-CM | POA: Diagnosis not present

## 2023-05-22 DIAGNOSIS — L814 Other melanin hyperpigmentation: Secondary | ICD-10-CM | POA: Diagnosis not present

## 2023-05-22 DIAGNOSIS — D229 Melanocytic nevi, unspecified: Secondary | ICD-10-CM

## 2023-05-22 NOTE — Progress Notes (Signed)
Follow-Up Visit   Subjective  Caroline Conley is a 57 y.o. female who presents for the following: a spot at left buttocks where previous MM was.   The patient has spots, moles and lesions to be evaluated, some may be new or changing and the patient may have concern these could be cancer.   The following portions of the chart were reviewed this encounter and updated as appropriate: medications, allergies, medical history  Review of Systems:  No other skin or systemic complaints except as noted in HPI or Assessment and Plan.  Objective  Well appearing patient in no apparent distress; mood and affect are within normal limits.  A full examination was performed including scalp, head, eyes, ears, nose, lips, neck, chest, axillae, abdomen, back, buttocks, bilateral upper extremities, bilateral lower extremities, hands, feet, fingers, toes, fingernails, and toenails. All findings within normal limits unless otherwise noted below.    Relevant exam findings are noted in the Assessment and Plan.    Assessment & Plan  Skin cancer screening performed today.   MELANOCYTIC NEVI Exam: Tan-brown and/or pink-flesh-colored symmetric macules and papules including left buttock  2 mm dark brown macule at left spinal lower back  2.5 mm medium dark brown macule at left anterior shoulder  Treatment Plan: Benign appearing on exam today. Recommend observation. Call clinic for new or changing moles. Recommend daily use of broad spectrum spf 30+ sunscreen to sun-exposed areas.   LENTIGINES Exam: scattered tan macules Due to sun exposure Treatment Plan: Benign-appearing, observe. Recommend daily broad spectrum sunscreen SPF 30+ to sun-exposed areas, reapply every 2 hours as needed.  Call for any changes  ACTINIC DAMAGE - chronic, secondary to cumulative UV radiation exposure/sun exposure over time - diffuse scaly erythematous macules with underlying dyspigmentation - Recommend daily broad spectrum  sunscreen SPF 30+ to sun-exposed areas, reapply every 2 hours as needed.  - Recommend staying in the shade or wearing long sleeves, sun glasses (UVA+UVB protection) and wide brim hats (4-inch brim around the entire circumference of the hat). - Call for new or changing lesions.  History of Dysplastic Nevi - No evidence of recurrence today - Recommend regular full body skin exams - Recommend daily broad spectrum sunscreen SPF 30+ to sun-exposed areas, reapply every 2 hours as needed.  - Call if any new or changing lesions are noted between office visits  HISTORY OF MELANOMA L buttock 2016, 0.29mm, SLN neg. - No evidence of recurrence today - Recommend regular full body skin exams - Recommend daily broad spectrum sunscreen SPF 30+ to sun-exposed areas, reapply every 2 hours as needed.  - Call if any new or changing lesions are noted between office visits  WART Exam: verrucous papule R index palmar  Counseling Discussed viral / HPV (Human Papilloma Virus) etiology and risk of spread /infectivity to other areas of body as well as to other people.  Multiple treatments and methods may be required to clear warts and it is possible treatment may not be successful.  Treatment risks include discoloration; scarring and there is still potential for wart recurrence.  Treatment Plan: Destruction Procedure Note Destruction method: cryotherapy   Informed consent: discussed and consent obtained   Lesion destroyed using liquid nitrogen: Yes   Outcome: patient tolerated procedure well with no complications   Post-procedure details: wound care instructions given   Locations: R index palmar # of Lesions Treated: 1  Prior to procedure, discussed risks of blister formation, small wound, skin dyspigmentation, or rare scar following cryotherapy.  Recommend Vaseline ointment to treated areas while healing.    Return in 1 year (on 05/21/2024), or if symptoms worsen or fail to improve, for TBSE, Hx MM, Hx  Dysplastic Nevi.  Anise Salvo, RMA, am acting as scribe for Willeen Niece, MD .   Documentation: I have reviewed the above documentation for accuracy and completeness, and I agree with the above.  Willeen Niece, MD

## 2023-05-22 NOTE — Patient Instructions (Addendum)
Cryotherapy Aftercare  Wash gently with soap and water everyday.   Apply Vaseline and Band-Aid daily until healed.    Melanoma ABCDEs  Melanoma is the most dangerous type of skin cancer, and is the leading cause of death from skin disease.  You are more likely to develop melanoma if you: Have light-colored skin, light-colored eyes, or red or blond hair Spend a lot of time in the sun Tan regularly, either outdoors or in a tanning bed Have had blistering sunburns, especially during childhood Have a close family member who has had a melanoma Have atypical moles or large birthmarks  Early detection of melanoma is key since treatment is typically straightforward and cure rates are extremely high if we catch it early.   The first sign of melanoma is often a change in a mole or a new dark spot.  The ABCDE system is a way of remembering the signs of melanoma.  A for asymmetry:  The two halves do not match. B for border:  The edges of the growth are irregular. C for color:  A mixture of colors are present instead of an even brown color. D for diameter:  Melanomas are usually (but not always) greater than 6mm - the size of a pencil eraser. E for evolution:  The spot keeps changing in size, shape, and color.  Please check your skin once per month between visits. You can use a small mirror in front and a large mirror behind you to keep an eye on the back side or your body.   If you see any new or changing lesions before your next follow-up, please call to schedule a visit.  Please continue daily skin protection including broad spectrum sunscreen SPF 30+ to sun-exposed areas, reapplying every 2 hours as needed when you're outdoors.    Due to recent changes in healthcare laws, you may see results of your pathology and/or laboratory studies on MyChart before the doctors have had a chance to review them. We understand that in some cases there may be results that are confusing or concerning to you.  Please understand that not all results are received at the same time and often the doctors may need to interpret multiple results in order to provide you with the best plan of care or course of treatment. Therefore, we ask that you please give us 2 business days to thoroughly review all your results before contacting the office for clarification. Should we see a critical lab result, you will be contacted sooner.   If You Need Anything After Your Visit  If you have any questions or concerns for your doctor, please call our main line at [phone] and press option 4 to reach your doctor's medical assistant. If no one answers, please leave a voicemail as directed and we will return your call as soon as possible. Messages left after 4 pm will be answered the following business day.   You may also send us a message via MyChart. We typically respond to MyChart messages within 1-2 business days.  For prescription refills, please ask your pharmacy to contact our office. Our fax number is [phone].  If you have an urgent issue when the clinic is closed that cannot wait until the next business day, you can page your doctor at the number below.    Please note that while we do our best to be available for urgent issues outside of office hours, we are not available 24/7.   If you have an urgent   issue and are unable to reach us, you may choose to seek medical care at your doctor's office, retail clinic, urgent care center, or emergency room.  If you have a medical emergency, please immediately call 911 or go to the emergency department.  Pager Numbers  - Dr. Kowalski: [phone]  - Dr. Moye: [phone]  - Dr. Stewart: [phone]  In the event of inclement weather, please call our main line at [phone] for an update on the status of any delays or closures.  Dermatology Medication Tips: Please keep the boxes that topical medications come in in order to help keep track of the  instructions about where and how to use these. Pharmacies typically print the medication instructions only on the boxes and not directly on the medication tubes.   If your medication is too expensive, please contact our office at [phone] option 4 or send us a message through MyChart.   We are unable to tell what your co-pay for medications will be in advance as this is different depending on your insurance coverage. However, we may be able to find a substitute medication at lower cost or fill out paperwork to get insurance to cover a needed medication.   If a prior authorization is required to get your medication covered by your insurance company, please allow us 1-2 business days to complete this process.  Drug prices often vary depending on where the prescription is filled and some pharmacies may offer cheaper prices.  The website www.goodrx.com contains coupons for medications through different pharmacies. The prices here do not account for what the cost may be with help from insurance (it may be cheaper with your insurance), but the website can give you the price if you did not use any insurance.  - You can print the associated coupon and take it with your prescription to the pharmacy.  - You may also stop by our office during regular business hours and pick up a GoodRx coupon card.  - If you need your prescription sent electronically to a different pharmacy, notify our office through [place] MyChart or by phone at [phone] option 4.  

## 2023-06-01 ENCOUNTER — Other Ambulatory Visit: Payer: Self-pay | Admitting: Family Medicine

## 2023-06-01 ENCOUNTER — Other Ambulatory Visit: Payer: Self-pay | Admitting: Internal Medicine

## 2023-06-01 DIAGNOSIS — B370 Candidal stomatitis: Secondary | ICD-10-CM

## 2023-06-01 DIAGNOSIS — J441 Chronic obstructive pulmonary disease with (acute) exacerbation: Secondary | ICD-10-CM

## 2023-06-02 NOTE — Telephone Encounter (Signed)
Requested medication (s) are due for refill today: Yes  Requested medication (s) are on the active medication list: yes    Last refill: 01/29/23  #6  3 refills  Future visit scheduled no  Notes to clinic:Off protocol, please review. Thank you.  Requested Prescriptions  Pending Prescriptions Disp Refills   fluconazole (DIFLUCAN) 150 MG tablet [Pharmacy Med Name: FLUCONAZOLE 150 MG TABLET] 6 tablet 3    Sig: TAKE 1 TABLET ON DAY 1. MAY REPEAT ON DAY 3     Off-Protocol Failed - 06/01/2023 12:25 PM      Failed - Medication not assigned to a protocol, review manually.      Passed - Valid encounter within last 12 months    Recent Outpatient Visits           2 months ago COPD with acute exacerbation [facility])   [place] [facility], Netta Neat, DO   4 months ago COPD with acute exacerbation [facility])   [place] [facility] Smitty Cords, DO   6 months ago COPD with acute exacerbation [facility])   [place] [facility] Smitty Cords, DO   9 months ago COPD with acute exacerbation [facility]   [place] Smitty Cords, DO   11 months ago COPD with acute exacerbation [facility])   [facility] [place], Netta Neat, [place]

## 2023-06-17 DIAGNOSIS — N816 Rectocele: Secondary | ICD-10-CM | POA: Diagnosis not present

## 2023-06-17 DIAGNOSIS — N8111 Cystocele, midline: Secondary | ICD-10-CM | POA: Diagnosis not present

## 2023-06-17 DIAGNOSIS — N3281 Overactive bladder: Secondary | ICD-10-CM | POA: Diagnosis not present

## 2023-06-24 DIAGNOSIS — M791 Myalgia, unspecified site: Secondary | ICD-10-CM | POA: Diagnosis not present

## 2023-06-24 DIAGNOSIS — M25512 Pain in left shoulder: Secondary | ICD-10-CM | POA: Diagnosis not present

## 2023-06-24 DIAGNOSIS — Z79899 Other long term (current) drug therapy: Secondary | ICD-10-CM | POA: Diagnosis not present

## 2023-06-24 DIAGNOSIS — M5451 Vertebrogenic low back pain: Secondary | ICD-10-CM | POA: Diagnosis not present

## 2023-06-26 DIAGNOSIS — R350 Frequency of micturition: Secondary | ICD-10-CM | POA: Diagnosis not present

## 2023-06-26 DIAGNOSIS — N8111 Cystocele, midline: Secondary | ICD-10-CM | POA: Diagnosis not present

## 2023-06-26 DIAGNOSIS — N393 Stress incontinence (female) (male): Secondary | ICD-10-CM | POA: Diagnosis not present

## 2023-06-26 DIAGNOSIS — R351 Nocturia: Secondary | ICD-10-CM | POA: Diagnosis not present

## 2023-06-27 ENCOUNTER — Other Ambulatory Visit: Payer: Medicare HMO

## 2023-07-01 ENCOUNTER — Telehealth (INDEPENDENT_AMBULATORY_CARE_PROVIDER_SITE_OTHER): Payer: Medicare HMO | Admitting: Family Medicine

## 2023-07-01 DIAGNOSIS — B370 Candidal stomatitis: Secondary | ICD-10-CM | POA: Diagnosis not present

## 2023-07-01 DIAGNOSIS — J441 Chronic obstructive pulmonary disease with (acute) exacerbation: Secondary | ICD-10-CM | POA: Diagnosis not present

## 2023-07-01 MED ORDER — PREDNISONE 20 MG PO TABS: ORAL_TABLET | ORAL | 0 refills | Status: DC

## 2023-07-01 MED ORDER — LEVOFLOXACIN 500 MG PO TABS: 500.0000 mg | ORAL_TABLET | Freq: Every day | ORAL | 0 refills | Status: DC

## 2023-07-01 MED ORDER — HYDROCOD POLI-CHLORPHE POLI ER 10-8 MG/5ML PO SUER
5.0000 mL | Freq: Two times a day (BID) | ORAL | 0 refills | Status: DC | PRN
Start: 2023-07-01 — End: 2023-08-26

## 2023-07-01 MED ORDER — MAGIC MOUTHWASH W/LIDOCAINE
ORAL | 0 refills | Status: AC
Start: 2023-07-01 — End: ?

## 2023-07-01 NOTE — Progress Notes (Unsigned)
Subjective:    Patient ID: Caroline Conley, female    DOB: 01-04-1966, 57 y.o.   MRN: [id number]  Caroline Conley is a 57 y.o. female presenting on 07/01/2023 for COPD  Virtual / Telehealth Encounter - Video Visit via MyChart The purpose of this virtual visit is to provide medical care while limiting exposure to the novel coronavirus (COVID19) for both patient and office staff.  Consent was obtained for remote visit:  Yes.   Answered questions that patient had about telehealth interaction:  Yes.   I discussed the limitations, risks, security and privacy concerns of performing an evaluation and management service by video/telephone. I also discussed with the patient that there may be a patient responsible charge related to this service. The patient expressed understanding and agreed to proceed.  Patient Location: Home Provider Location: [facility] (Office)  Participants in virtual visit: - Patient: Caroline Conley - CMA: Darrol Angel CMA - Provider: Dr Althea Charon   HPI  Acute COPD Exacerbation   Last seen at Urgent Care 1 week ago given Levaquin 5 days and few days prednisone No fevers Thicker secretions Temporary improvement, but did not resolve   Worsening again symptoms productive cough thicker sputum with color change. Similar to previous flares   Continues Trelegy   Admits cough, shortness of breath   Needs re order cough syrup.   Using nebulizer   Denies any known or suspected exposure to person with or possibly with COVID19.  Health Maintenance: ***     04/03/2023    9:25 AM 03/21/2023    3:41 PM 01/29/2023    3:57 PM  Depression screen PHQ 2/9  Decreased Interest 0 0 0  Down, Depressed, Hopeless 0 0 0  PHQ - 2 Score 0 0 0  Altered sleeping 0 0 0  Tired, decreased energy 0 0 0  Change in appetite 0 0 0  Feeling bad or failure about yourself  0 0 0  Trouble concentrating 0 0 0  Moving slowly or fidgety/restless 0 0 0  Suicidal thoughts 0  0 0  PHQ-9 Score 0 0 0  Difficult doing work/chores  Not difficult at all Not difficult at all    Social History   Tobacco Use   Smoking status: Former    Current packs/day: 0.00    Average packs/day: 1 pack/day for 30.0 years (30.0 ttl pk-yrs)    Types: Cigarettes    Start date: 03/02/1988    Quit date: 03/02/2018    Years since quitting: 5.3   Smokeless tobacco: Never   Tobacco comments:    0.5 to 1 ppd >30 years  Vaping Use   Vaping status: Never Used  Substance Use Topics   Alcohol use: No   Drug use: Yes    Types: Oxycodone    Review of Systems Per HPI unless specifically indicated above     Objective:    There were no vitals taken for this visit.  Wt Readings from Last 3 Encounters:  04/03/23 129 lb (58.5 kg)  03/21/23 132 lb 12.8 oz (60.2 kg)  01/29/23 129 lb (58.5 kg)    Physical Exam Results for orders placed or performed during the hospital encounter of 10/28/22  Resp Panel by RT-PCR (Flu A&B, Covid) Urine, Clean Catch   Specimen: Urine, Clean Catch; Nasal Swab  Result Value Ref Range   SARS Coronavirus 2 by RT PCR POSITIVE (A) NEGATIVE   Influenza A by PCR NEGATIVE NEGATIVE   Influenza  B by PCR NEGATIVE NEGATIVE  Lipase, blood  Result Value Ref Range   Lipase 31 11 - 51 U/L  Comprehensive metabolic panel  Result Value Ref Range   Sodium 138 135 - 145 mmol/L   Potassium 3.6 3.5 - 5.1 mmol/L   Chloride 104 98 - 111 mmol/L   CO2 24 22 - 32 mmol/L   Glucose, Bld 129 (H) 70 - 99 mg/dL   BUN 8 6 - 20 mg/dL   Creatinine, Ser 1.61 0.44 - 1.00 mg/dL   Calcium 9.5 8.9 - 09.6 mg/dL   Total Protein 7.2 6.5 - 8.1 g/dL   Albumin 3.5 3.5 - 5.0 g/dL   AST 77 (H) 15 - 41 U/L   ALT 30 0 - 44 U/L   Alkaline Phosphatase 150 (H) 38 - 126 U/L   Total Bilirubin 0.8 0.3 - 1.2 mg/dL   GFR, Estimated >04 >54 mL/min   Anion gap 10 5 - 15  CBC  Result Value Ref Range   WBC 11.1 (H) 4.0 - 10.5 K/uL   RBC 4.36 3.87 - 5.11 MIL/uL   Hemoglobin 15.0 12.0 - 15.0 g/dL    HCT 09.8 11.9 - 14.7 %   MCV 96.8 80.0 - 100.0 fL   MCH 34.4 (H) 26.0 - 34.0 pg   MCHC 35.5 30.0 - 36.0 g/dL   RDW 82.9 56.2 - 13.0 %   Platelets 277 150 - 400 K/uL   nRBC 0.0 0.0 - 0.2 %  Urinalysis, Routine w reflex microscopic Urine, Clean Catch  Result Value Ref Range   Color, Urine STRAW (A) YELLOW   APPearance CLEAR (A) CLEAR   Specific Gravity, Urine 1.008 1.005 - 1.030   pH 6.0 5.0 - 8.0   Glucose, UA NEGATIVE NEGATIVE mg/dL   Hgb urine dipstick MODERATE (A) NEGATIVE   Bilirubin Urine NEGATIVE NEGATIVE   Ketones, ur NEGATIVE NEGATIVE mg/dL   Protein, ur NEGATIVE NEGATIVE mg/dL   Nitrite NEGATIVE NEGATIVE   Leukocytes,Ua NEGATIVE NEGATIVE   RBC / HPF 0-5 0 - 5 RBC/hpf   WBC, UA 6-10 0 - 5 WBC/hpf   Bacteria, UA NONE SEEN NONE SEEN   Squamous Epithelial / HPF 0-5 0 - 5   Mucus PRESENT   Phosphorus  Result Value Ref Range   Phosphorus 2.7 2.5 - 4.6 mg/dL  Magnesium  Result Value Ref Range   Magnesium 1.8 1.7 - 2.4 mg/dL  Procalcitonin - Baseline  Result Value Ref Range   Procalcitonin <0.10 ng/mL      Assessment & Plan:   Problem List Items Addressed This Visit   None Visit Diagnoses     Oral yeast infection           No orders of the defined types were placed in this encounter.     Follow up plan: No follow-ups on file.  ***Future labs ordered for ***  A total of *** (lvl 3, 4, 5) 15, 25, 40 minutes was spent face-to-face with this patient. Greater than 50% (approximately *** minutes) of this time was spent in counseling and coordination of care with the patient. ***  Saralyn Pilar, DO [facility] 07/01/2023, 4:55 PM

## 2023-07-02 NOTE — Patient Instructions (Signed)
 ° °  Please schedule a Follow-up Appointment to: No follow-ups on file. ° °If you have any other questions or concerns, please feel free to call the office or send a message through MyChart. You may also schedule an earlier appointment if necessary. ° °Additionally, you may be receiving a survey about your experience at our office within a few days to 1 week by e-mail or mail. We value your feedback. ° °Alexander Karamalegos, DO °[facility] °

## 2023-07-16 ENCOUNTER — Ambulatory Visit: Payer: Medicare HMO | Admitting: Family Medicine

## 2023-07-31 ENCOUNTER — Other Ambulatory Visit: Payer: Self-pay | Admitting: Internal Medicine

## 2023-07-31 DIAGNOSIS — J441 Chronic obstructive pulmonary disease with (acute) exacerbation: Secondary | ICD-10-CM

## 2023-07-31 DIAGNOSIS — J449 Chronic obstructive pulmonary disease, unspecified: Secondary | ICD-10-CM

## 2023-08-08 ENCOUNTER — Encounter: Payer: Self-pay | Admitting: Family Medicine

## 2023-08-08 ENCOUNTER — Ambulatory Visit: Payer: Medicare HMO | Admitting: Family Medicine

## 2023-08-08 VITALS — BP 135/88 | HR 111 | Ht 66.0 in | Wt 129.0 lb

## 2023-08-08 DIAGNOSIS — M25511 Pain in right shoulder: Secondary | ICD-10-CM | POA: Diagnosis not present

## 2023-08-08 MED ORDER — METHYLPREDNISOLONE ACETATE 40 MG/ML IJ SUSP
40.0000 mg | Freq: Once | INTRAMUSCULAR | Status: AC
Start: 2023-08-08 — End: 2023-08-08
  Administered 2023-08-08: 40 mg via INTRA_ARTICULAR

## 2023-08-08 MED ORDER — LIDOCAINE HCL (PF) 1 % IJ SOLN
4.0000 mL | Freq: Once | INTRAMUSCULAR | Status: AC
Start: 2023-08-08 — End: 2023-08-08
  Administered 2023-08-08: 4 mL

## 2023-08-08 NOTE — Patient Instructions (Signed)
Thank you for coming to the office today.  You received a Right Shoulder Joint steroid injection today. - Lidocaine numbing medicine may ease the pain initially for a few hours until it wears off - As discussed, you may experience a "steroid flare" this evening or within 24-48 hours, anytime medicine is injected into an inflamed joint it can cause the pain to get worse temporarily - Everyone responds differently to these injections, it depends on the patient and the severity of the joint problem, it may provide anywhere from days to weeks, to months of relief. Ideal response is >6 months relief - Try to take it easy for next 1-2 days, avoid over activity and strain on joint (limit lifting for shoulder) - Recommend the following:   - For swelling - rest, compression sleeve / ACE wrap, elevation, and ice packs as needed for first few days   - For pain in future may use heating pad or moist heat as needed    Please schedule a Follow-up Appointment to: No follow-ups on file.  If you have any other questions or concerns, please feel free to call the office or send a message through MyChart. You may also schedule an earlier appointment if necessary.  Additionally, you may be receiving a survey about your experience at our office within a few days to 1 week by e-mail or mail. We value your feedback.  Saralyn Pilar, DO [facility], [place]

## 2023-08-08 NOTE — Progress Notes (Signed)
Subjective:    Patient ID: Caroline Conley, female    DOB: [birth year], 56 y.o.   MRN: [id number]  Caroline Conley is a 57 y.o. female presenting on 08/08/2023 for Shoulder Pain (Right shoulder)   HPI  FOLLOW-UP SHOULDER PAIN / history of chronic R frozen shoulder and prior rotator cuff surgery   - Last visit with me for this problem with steroid injection R shoulder back in 01/01/18 and 03/2021, same problem, see prior notes for background information. - Interval update with improved R shoulder pain from bursitis for past >4 yrs years. Gradual worsening again but did well with prior shots  - Today patient reports ready for new injection. Known chronic problem for few years after her R shoulder surgery, seems to hurt worse post-op than before, has had frozen shoulder, in past received steroid subacromial injections into bursa with good results. - Requesting new injection today, she was followed by [facility] in [place] in past.  - Taking NSAID and chronic pain meds percocet with some relief - Her range of motion has never improved post op due to frozen shoulder - No new fall or injury - Denies any redness, bruising, swelling, numbness, tingling, weakness       08/08/2023    9:10 AM 04/03/2023    9:25 AM 03/21/2023    3:41 PM  Depression screen PHQ 2/9  Decreased Interest 0 0 0  Down, Depressed, Hopeless 0 0 0  PHQ - 2 Score 0 0 0  Altered sleeping 0 0 0  Tired, decreased energy 0 0 0  Change in appetite 0 0 0  Feeling bad or failure about yourself  0 0 0  Trouble concentrating 0 0 0  Moving slowly or fidgety/restless 0 0 0  Suicidal thoughts 0 0 0  PHQ-9 Score 0 0 0  Difficult doing work/chores Not difficult at all  Not difficult at all    Social History   Tobacco Use   Smoking status: Former    Current packs/day: 0.00    Average packs/day: 1 pack/day for 30.0 years (30.0 ttl pk-yrs)    Types: Cigarettes    Start date: 03/02/1988    Quit date: 03/02/2018    Years since  quitting: 5.4   Smokeless tobacco: Never   Tobacco comments:    0.5 to 1 ppd >30 years  Vaping Use   Vaping status: Never Used  Substance Use Topics   Alcohol use: No   Drug use: Yes    Types: Oxycodone    Review of Systems Per HPI unless specifically indicated above     Objective:    BP 135/88   Pulse (!) 111   Ht 5\' 6"  (1.676 m)   Wt 129 lb (58.5 kg)   SpO2 97%   BMI 20.82 kg/m   Wt Readings from Last 3 Encounters:  08/08/23 129 lb (58.5 kg)  04/03/23 129 lb (58.5 kg)  03/21/23 132 lb 12.8 oz (60.2 kg)    Physical Exam Vitals and nursing note reviewed.  Constitutional:      General: She is not in acute distress.    Appearance: Normal appearance. She is well-developed. She is not diaphoretic.     Comments: Well-appearing, comfortable, cooperative  HENT:     Head: Normocephalic and atraumatic.  Eyes:     General:        Right eye: No discharge.        Left eye: No discharge.     Conjunctiva/sclera: Conjunctivae  normal.  Cardiovascular:     Rate and Rhythm: Normal rate.  Pulmonary:     Effort: Pulmonary effort is normal.  Musculoskeletal:     Comments: Right Shoulder reduced range of motion but has intact forward flex to 90* and some abduction.  Skin:    General: Skin is warm and dry.     Findings: No erythema or rash.  Neurological:     Mental Status: She is alert and oriented to person, place, and time.  Psychiatric:        Mood and Affect: Mood normal.        Behavior: Behavior normal.        Thought Content: Thought content normal.     Comments: Well groomed, good eye contact, normal speech and thoughts     ________________________________________________________ PROCEDURE NOTE Date: 08/08/23 Right Shoulder subacromial injection Discussed benefits and risks (including pain, bleeding, infection, steroid flare). Verbal consent given by patient. Medication:  1 cc Depo-medrol 40mg  and 4 cc Lidocaine 1% without epi Time Out taken  Landmarks  identified. Area cleansed with alcohol wipes. Using 21 gauge and 1, 1/2 inch needle, Right subacromial bursa space was injected (with above listed medication) via posterior approach cold spray used for superficial anesthetic. Sterile bandage placed. Patient tolerated procedure well without bleeding or paresthesias. No complications.  Results for orders placed or performed during the hospital encounter of 10/28/22  Resp Panel by RT-PCR (Flu A&B, Covid) Urine, Clean Catch   Specimen: Urine, Clean Catch; Nasal Swab  Result Value Ref Range   SARS Coronavirus 2 by RT PCR POSITIVE (A) NEGATIVE   Influenza A by PCR NEGATIVE NEGATIVE   Influenza B by PCR NEGATIVE NEGATIVE  Lipase, blood  Result Value Ref Range   Lipase 31 11 - 51 U/L  Comprehensive metabolic panel  Result Value Ref Range   Sodium 138 135 - 145 mmol/L   Potassium 3.6 3.5 - 5.1 mmol/L   Chloride 104 98 - 111 mmol/L   CO2 24 22 - 32 mmol/L   Glucose, Bld 129 (H) 70 - 99 mg/dL   BUN 8 6 - 20 mg/dL   Creatinine, Ser 6.64 0.44 - 1.00 mg/dL   Calcium 9.5 8.9 - 40.3 mg/dL   Total Protein 7.2 6.5 - 8.1 g/dL   Albumin 3.5 3.5 - 5.0 g/dL   AST 77 (H) 15 - 41 U/L   ALT 30 0 - 44 U/L   Alkaline Phosphatase 150 (H) 38 - 126 U/L   Total Bilirubin 0.8 0.3 - 1.2 mg/dL   GFR, Estimated >47 >42 mL/min   Anion gap 10 5 - 15  CBC  Result Value Ref Range   WBC 11.1 (H) 4.0 - 10.5 K/uL   RBC 4.36 3.87 - 5.11 MIL/uL   Hemoglobin 15.0 12.0 - 15.0 g/dL   HCT 59.5 63.8 - 75.6 %   MCV 96.8 80.0 - 100.0 fL   MCH 34.4 (H) 26.0 - 34.0 pg   MCHC 35.5 30.0 - 36.0 g/dL   RDW 43.3 29.5 - 18.8 %   Platelets 277 150 - 400 K/uL   nRBC 0.0 0.0 - 0.2 %  Urinalysis, Routine w reflex microscopic Urine, Clean Catch  Result Value Ref Range   Color, Urine STRAW (A) YELLOW   APPearance CLEAR (A) CLEAR   Specific Gravity, Urine 1.008 1.005 - 1.030   pH 6.0 5.0 - 8.0   Glucose, UA NEGATIVE NEGATIVE mg/dL   Hgb urine dipstick MODERATE (A) NEGATIVE  Bilirubin Urine NEGATIVE NEGATIVE   Ketones, ur NEGATIVE NEGATIVE mg/dL   Protein, ur NEGATIVE NEGATIVE mg/dL   Nitrite NEGATIVE NEGATIVE   Leukocytes,Ua NEGATIVE NEGATIVE   RBC / HPF 0-5 0 - 5 RBC/hpf   WBC, UA 6-10 0 - 5 WBC/hpf   Bacteria, UA NONE SEEN NONE SEEN   Squamous Epithelial / HPF 0-5 0 - 5   Mucus PRESENT   Phosphorus  Result Value Ref Range   Phosphorus 2.7 2.5 - 4.6 mg/dL  Magnesium  Result Value Ref Range   Magnesium 1.8 1.7 - 2.4 mg/dL  Procalcitonin - Baseline  Result Value Ref Range   Procalcitonin <0.10 ng/mL      Assessment & Plan:   Problem List Items Addressed This Visit     Shoulder joint pain - Primary    Meds ordered this encounter  Medications   lidocaine (PF) (XYLOCAINE) 1 % injection 4 mL   methylPREDNISolone acetate (DEPO-MEDROL) injection 40 mg    Consistent with subacute on chronic R-shoulder bursitis with known rotator cuff tendinopathy, s/p repair and frozen shoulder complication - worse with repetitive activities, triggers bursitis - limited ROM significantly. - Last imaging L shoulder x-ray she has had prior R shoulder imaging years ago at [facility]   Last steroid injection R shoulder subacromial done by me 12/2017, and 03/2021    Plan: Right shoulder subacromial steroid injection performed today, see procedure note for details. Continue current meds for pain management no med changes   Relative rest but keep shoulder mobile, demonstrated ROM exercises, avoid heavy lifting   Return to Ortho as planned in future for surgery    Follow up plan: Return if symptoms worsen or fail to improve.  Saralyn Pilar, DO [facility] 08/08/2023, 9:24 AM

## 2023-08-26 ENCOUNTER — Ambulatory Visit (INDEPENDENT_AMBULATORY_CARE_PROVIDER_SITE_OTHER): Payer: Medicare HMO | Admitting: Family Medicine

## 2023-08-26 ENCOUNTER — Encounter: Payer: Self-pay | Admitting: Family Medicine

## 2023-08-26 VITALS — BP 108/76 | HR 89 | Ht 66.0 in | Wt 127.6 lb

## 2023-08-26 DIAGNOSIS — T23111A Burn of first degree of right thumb (nail), initial encounter: Secondary | ICD-10-CM

## 2023-08-26 DIAGNOSIS — Z23 Encounter for immunization: Secondary | ICD-10-CM

## 2023-08-26 DIAGNOSIS — R112 Nausea with vomiting, unspecified: Secondary | ICD-10-CM | POA: Diagnosis not present

## 2023-08-26 DIAGNOSIS — M25511 Pain in right shoulder: Secondary | ICD-10-CM

## 2023-08-26 DIAGNOSIS — B379 Candidiasis, unspecified: Secondary | ICD-10-CM

## 2023-08-26 DIAGNOSIS — J441 Chronic obstructive pulmonary disease with (acute) exacerbation: Secondary | ICD-10-CM | POA: Diagnosis not present

## 2023-08-26 MED ORDER — PREDNISONE 20 MG PO TABS
ORAL_TABLET | ORAL | 0 refills | Status: DC
Start: 2023-08-26 — End: 2023-10-13

## 2023-08-26 MED ORDER — FLUCONAZOLE 150 MG PO TABS: 150.0000 mg | ORAL_TABLET | ORAL | 3 refills | Status: DC

## 2023-08-26 MED ORDER — SILVER SULFADIAZINE 1 % EX CREA
1.0000 | TOPICAL_CREAM | Freq: Every day | CUTANEOUS | 0 refills | Status: DC
Start: 2023-08-26 — End: 2023-12-19

## 2023-08-26 MED ORDER — PROMETHAZINE HCL 25 MG PO TABS
12.5000 mg | ORAL_TABLET | Freq: Three times a day (TID) | ORAL | 3 refills | Status: DC | PRN
Start: 2023-08-26 — End: 2024-04-26

## 2023-08-26 MED ORDER — HYDROCOD POLI-CHLORPHE POLI ER 10-8 MG/5ML PO SUER
5.0000 mL | Freq: Two times a day (BID) | ORAL | 0 refills | Status: DC | PRN
Start: 2023-08-26 — End: 2023-10-13

## 2023-08-26 MED ORDER — DOXYCYCLINE HYCLATE 100 MG PO TABS: 100.0000 mg | ORAL_TABLET | Freq: Two times a day (BID) | ORAL | 0 refills | Status: DC

## 2023-08-26 MED ORDER — TIZANIDINE HCL 4 MG PO TABS
4.0000 mg | ORAL_TABLET | Freq: Three times a day (TID) | ORAL | 3 refills | Status: DC | PRN
Start: 2023-08-26 — End: 2024-10-05

## 2023-08-26 NOTE — Progress Notes (Signed)
Subjective:    Patient ID: Caroline Conley, female    DOB: 1966-08-27, 57 y.o.   MRN: [id number]  Caroline Conley is a 57 y.o. female presenting on 08/26/2023 for COPD  Meds did not transfer from [facility] to [facility]  HPI  Acute COPD Exacerbation   Last flare 06/2023 Early symptoms now productive cough and dyspnea No fevers  Thick sputum production  Continues Trelegy   Admits cough, shortness of breath   Needs re order cough syrup.   Using nebulizer   Denies any known or suspected exposure to person with or possibly with COVID19.  Due flu shot today     08/26/2023    1:58 PM 08/08/2023    9:10 AM 04/03/2023    9:25 AM  Depression screen PHQ 2/9  Decreased Interest 0 0 0  Down, Depressed, Hopeless 0 0 0  PHQ - 2 Score 0 0 0  Altered sleeping 0 0 0  Tired, decreased energy 0 0 0  Change in appetite 0 0 0  Feeling bad or failure about yourself  0 0 0  Trouble concentrating 0 0 0  Moving slowly or fidgety/restless 0 0 0  Suicidal thoughts 0 0 0  PHQ-9 Score 0 0 0  Difficult doing work/chores Not difficult at all Not difficult at all     Social History   Tobacco Use   Smoking status: Former    Current packs/day: 0.00    Average packs/day: 1 pack/day for 30.0 years (30.0 ttl pk-yrs)    Types: Cigarettes    Start date: 03/02/1988    Quit date: 03/02/2018    Years since quitting: 5.4   Smokeless tobacco: Never   Tobacco comments:    0.5 to 1 ppd >30 years  Vaping Use   Vaping status: Never Used  Substance Use Topics   Alcohol use: No   Drug use: Yes    Types: Oxycodone    Review of Systems Per HPI unless specifically indicated above     Objective:    BP 108/76   Pulse 89   Ht 5\' 6"  (1.676 m)   Wt 127 lb 9.6 oz (57.9 kg)   SpO2 97%   BMI 20.60 kg/m   Wt Readings from Last 3 Encounters:  08/26/23 127 lb 9.6 oz (57.9 kg)  08/08/23 129 lb (58.5 kg)  04/03/23 129 lb (58.5 kg)    Physical Exam Vitals and nursing note reviewed.  Constitutional:       General: She is not in acute distress.    Appearance: She is well-developed. She is not diaphoretic.     Comments: Well-appearing, comfortable, cooperative  HENT:     Head: Normocephalic and atraumatic.  Eyes:     General:        Right eye: No discharge.        Left eye: No discharge.     Conjunctiva/sclera: Conjunctivae normal.  Neck:     Thyroid: No thyromegaly.  Cardiovascular:     Rate and Rhythm: Normal rate and regular rhythm.     Heart sounds: Normal heart sounds. No murmur heard. Pulmonary:     Effort: Pulmonary effort is normal. No respiratory distress.     Breath sounds: Wheezing present. No rales.  Musculoskeletal:        General: Normal range of motion.     Cervical back: Normal range of motion and neck supple.  Lymphadenopathy:     Cervical: No cervical adenopathy.  Skin:  General: Skin is warm and dry.     Findings: No erythema or rash.  Neurological:     Mental Status: She is alert and oriented to person, place, and time.  Psychiatric:        Behavior: Behavior normal.     Comments: Well groomed, good eye contact, normal speech and thoughts    Results for orders placed or performed during the hospital encounter of 10/28/22  Resp Panel by RT-PCR (Flu A&B, Covid) Urine, Clean Catch   Specimen: Urine, Clean Catch; Nasal Swab  Result Value Ref Range   SARS Coronavirus 2 by RT PCR POSITIVE (A) NEGATIVE   Influenza A by PCR NEGATIVE NEGATIVE   Influenza B by PCR NEGATIVE NEGATIVE  Lipase, blood  Result Value Ref Range   Lipase 31 11 - 51 U/L  Comprehensive metabolic panel  Result Value Ref Range   Sodium 138 135 - 145 mmol/L   Potassium 3.6 3.5 - 5.1 mmol/L   Chloride 104 98 - 111 mmol/L   CO2 24 22 - 32 mmol/L   Glucose, Bld 129 (H) 70 - 99 mg/dL   BUN 8 6 - 20 mg/dL   Creatinine, Ser 2.59 0.44 - 1.00 mg/dL   Calcium 9.5 8.9 - 56.3 mg/dL   Total Protein 7.2 6.5 - 8.1 g/dL   Albumin 3.5 3.5 - 5.0 g/dL   AST 77 (H) 15 - 41 U/L   ALT 30 0 - 44 U/L    Alkaline Phosphatase 150 (H) 38 - 126 U/L   Total Bilirubin 0.8 0.3 - 1.2 mg/dL   GFR, Estimated >87 >56 mL/min   Anion gap 10 5 - 15  CBC  Result Value Ref Range   WBC 11.1 (H) 4.0 - 10.5 K/uL   RBC 4.36 3.87 - 5.11 MIL/uL   Hemoglobin 15.0 12.0 - 15.0 g/dL   HCT 43.3 29.5 - 18.8 %   MCV 96.8 80.0 - 100.0 fL   MCH 34.4 (H) 26.0 - 34.0 pg   MCHC 35.5 30.0 - 36.0 g/dL   RDW 41.6 60.6 - 30.1 %   Platelets 277 150 - 400 K/uL   nRBC 0.0 0.0 - 0.2 %  Urinalysis, Routine w reflex microscopic Urine, Clean Catch  Result Value Ref Range   Color, Urine STRAW (A) YELLOW   APPearance CLEAR (A) CLEAR   Specific Gravity, Urine 1.008 1.005 - 1.030   pH 6.0 5.0 - 8.0   Glucose, UA NEGATIVE NEGATIVE mg/dL   Hgb urine dipstick MODERATE (A) NEGATIVE   Bilirubin Urine NEGATIVE NEGATIVE   Ketones, ur NEGATIVE NEGATIVE mg/dL   Protein, ur NEGATIVE NEGATIVE mg/dL   Nitrite NEGATIVE NEGATIVE   Leukocytes,Ua NEGATIVE NEGATIVE   RBC / HPF 0-5 0 - 5 RBC/hpf   WBC, UA 6-10 0 - 5 WBC/hpf   Bacteria, UA NONE SEEN NONE SEEN   Squamous Epithelial / HPF 0-5 0 - 5   Mucus PRESENT   Phosphorus  Result Value Ref Range   Phosphorus 2.7 2.5 - 4.6 mg/dL  Magnesium  Result Value Ref Range   Magnesium 1.8 1.7 - 2.4 mg/dL  Procalcitonin - Baseline  Result Value Ref Range   Procalcitonin <0.10 ng/mL      Assessment & Plan:   Problem List Items Addressed This Visit     Shoulder joint pain   Relevant Medications   tiZANidine (ZANAFLEX) 4 MG tablet   Other Visit Diagnoses     COPD with acute exacerbation (HCC)    -  Primary   Relevant Medications   doxycycline (VIBRA-TABS) 100 MG tablet   predniSONE (DELTASONE) 20 MG tablet   chlorpheniramine-HYDROcodone (TUSSIONEX) 10-8 MG/5ML   promethazine (PHENERGAN) 25 MG tablet   Need for influenza vaccination       Relevant Orders   Flu vaccine trivalent PF, 6mos and older(Flulaval,Afluria,Fluarix,Fluzone) (Completed)   Antibiotic-induced yeast infection        Relevant Medications   fluconazole (DIFLUCAN) 150 MG tablet   Nausea and vomiting, unspecified vomiting type       Relevant Medications   promethazine (PHENERGAN) 25 MG tablet   Superficial burn of right thumb, initial encounter       Relevant Medications   silver sulfADIAZINE (SILVADENE) 1 % cream       Acute COPD early flare Prior 06/2023 F/u with Pulmonology  Afebrile, so will continue w/ Flu Shot today  I have ordered Doxycycline course + Prednisone and Diflucan  Tussionex sent to [facility]  Medicines all refilled to [facility]   Orders Placed This Encounter  Procedures   Flu vaccine trivalent PF, 6mos and older(Flulaval,Afluria,Fluarix,Fluzone)     Meds ordered this encounter  Medications   doxycycline (VIBRA-TABS) 100 MG tablet    Sig: Take 1 tablet (100 mg total) by mouth 2 (two) times daily. For 10 days. Take with full glass of water, stay upright 30 min after taking.    Dispense:  20 tablet    Refill:  0   predniSONE (DELTASONE) 20 MG tablet    Sig: Take 2 tablets daily (40mg ) for 4 days, take 1 tab daily (20mg ) for 4 days, take half tab daily (10mg ) for 4 days    Dispense:  14 tablet    Refill:  0   fluconazole (DIFLUCAN) 150 MG tablet    Sig: Take 1 tablet (150 mg total) by mouth every other day.    Dispense:  6 tablet    Refill:  3   chlorpheniramine-HYDROcodone (TUSSIONEX) 10-8 MG/5ML    Sig: Take 5 mLs by mouth every 12 (twelve) hours as needed for cough.    Dispense:  140 mL    Refill:  0   promethazine (PHENERGAN) 25 MG tablet    Sig: Take 0.5-1 tablets (12.5-25 mg total) by mouth every 8 (eight) hours as needed for nausea or vomiting.    Dispense:  20 tablet    Refill:  3   tiZANidine (ZANAFLEX) 4 MG tablet    Sig: Take 1 tablet (4 mg total) by mouth every 8 (eight) hours as needed for muscle spasms.    Dispense:  90 tablet    Refill:  3   silver sulfADIAZINE (SILVADENE) 1 % cream    Sig: Apply 1 Application topically daily. For 1-2 weeks  then as needed    Dispense:  50 g    Refill:  0      Follow up plan: Return if symptoms worsen or fail to improve.   Saralyn Pilar, DO [facility] 08/26/2023, 2:02 PM

## 2023-08-26 NOTE — Patient Instructions (Addendum)
Thank you for coming to the office today.  Flu Shot today  I have ordered Doxycycline course + Prednisone and Diflucan  Tussionex sent to [facility]  Medicines all refilled to [facility]   Please schedule a Follow-up Appointment to: Return if symptoms worsen or fail to improve.  If you have any other questions or concerns, please feel free to call the office or send a message through MyChart. You may also schedule an earlier appointment if necessary.  Additionally, you may be receiving a survey about your experience at our office within a few days to 1 week by e-mail or mail. We value your feedback.  Saralyn Pilar, DO [facility], [place]

## 2023-08-29 DIAGNOSIS — N3941 Urge incontinence: Secondary | ICD-10-CM | POA: Diagnosis not present

## 2023-08-29 DIAGNOSIS — N819 Female genital prolapse, unspecified: Secondary | ICD-10-CM | POA: Diagnosis not present

## 2023-08-29 DIAGNOSIS — N393 Stress incontinence (female) (male): Secondary | ICD-10-CM | POA: Diagnosis not present

## 2023-08-29 DIAGNOSIS — R319 Hematuria, unspecified: Secondary | ICD-10-CM | POA: Diagnosis not present

## 2023-09-04 DIAGNOSIS — M65312 Trigger thumb, left thumb: Secondary | ICD-10-CM | POA: Diagnosis not present

## 2023-09-09 DIAGNOSIS — J069 Acute upper respiratory infection, unspecified: Secondary | ICD-10-CM | POA: Diagnosis not present

## 2023-09-09 DIAGNOSIS — Z20822 Contact with and (suspected) exposure to covid-19: Secondary | ICD-10-CM | POA: Diagnosis not present

## 2023-09-30 DIAGNOSIS — M7918 Myalgia, other site: Secondary | ICD-10-CM | POA: Diagnosis not present

## 2023-09-30 DIAGNOSIS — Z79899 Other long term (current) drug therapy: Secondary | ICD-10-CM | POA: Diagnosis not present

## 2023-09-30 DIAGNOSIS — M5451 Vertebrogenic low back pain: Secondary | ICD-10-CM | POA: Diagnosis not present

## 2023-09-30 DIAGNOSIS — M25612 Stiffness of left shoulder, not elsewhere classified: Secondary | ICD-10-CM | POA: Diagnosis not present

## 2023-09-30 DIAGNOSIS — M25512 Pain in left shoulder: Secondary | ICD-10-CM | POA: Diagnosis not present

## 2023-10-13 ENCOUNTER — Encounter: Payer: Self-pay | Admitting: Family Medicine

## 2023-10-13 ENCOUNTER — Ambulatory Visit (INDEPENDENT_AMBULATORY_CARE_PROVIDER_SITE_OTHER): Payer: Medicare HMO | Admitting: Family Medicine

## 2023-10-13 VITALS — BP 140/76 | HR 92 | Ht 66.0 in | Wt 132.0 lb

## 2023-10-13 DIAGNOSIS — J441 Chronic obstructive pulmonary disease with (acute) exacerbation: Secondary | ICD-10-CM

## 2023-10-13 DIAGNOSIS — B379 Candidiasis, unspecified: Secondary | ICD-10-CM

## 2023-10-13 MED ORDER — FLUCONAZOLE 150 MG PO TABS
150.0000 mg | ORAL_TABLET | ORAL | 3 refills | Status: DC
Start: 2023-10-13 — End: 2024-02-25

## 2023-10-13 MED ORDER — LEVOFLOXACIN 500 MG PO TABS
500.0000 mg | ORAL_TABLET | Freq: Every day | ORAL | 0 refills | Status: DC
Start: 2023-10-13 — End: 2023-12-12

## 2023-10-13 MED ORDER — PREDNISONE 20 MG PO TABS: ORAL_TABLET | ORAL | 0 refills | Status: DC

## 2023-10-13 MED ORDER — HYDROCOD POLI-CHLORPHE POLI ER 10-8 MG/5ML PO SUER
5.0000 mL | Freq: Two times a day (BID) | ORAL | 0 refills | Status: DC | PRN
Start: 2023-10-13 — End: 2023-12-12

## 2023-10-13 NOTE — Patient Instructions (Addendum)
Thank you for coming to the office today.  Refills sent to [facility] for the current treatment  Start taking Levaquin antibiotic 500mg  daily x 7 days STeroid taper Tussionex cough syrup Diflucan with additional refills.Thank you for coming to the office today.  Flu Shot today  I have ordered Doxycycline course + Prednisone and Diflucan  Tussionex sent to [facility]  Medicines all refilled to [facility]   Please schedule a Follow-up Appointment to: Return if symptoms worsen or fail to improve.  If you have any other questions or concerns, please feel free to call the office or send a message through MyChart. You may also schedule an earlier appointment if necessary.  Additionally, you may be receiving a survey about your experience at our office within a few days to 1 week by e-mail or mail. We value your feedback.  Saralyn Pilar, DO [facility], [facility]  Please schedule a Follow-up Appointment to: Return if symptoms worsen or fail to improve.  If you have any other questions or concerns, please feel free to call the office or send a message through MyChart. You may also schedule an earlier appointment if necessary.  Additionally, you may be receiving a survey about your experience at our office within a few days to 1 week by e-mail or mail. We value your feedback.  Saralyn Pilar, DO [facility], [place]

## 2023-10-13 NOTE — Progress Notes (Unsigned)
Subjective:    Patient ID: Caroline Conley, female    DOB: February 16, 1966, 57 y.o.   MRN: [id number]  Caroline Conley is a 57 y.o. female presenting on 10/13/2023 for COPD   HPI  Discussed the use of AI scribe software for clinical note transcription with the patient, who gave verbal consent to proceed.    Acute COPD Exacerbation  The patient, previously treated for chronic pulmonary disease 08/26/23, reports a recurrence of symptoms following exposure to cold weather during a camping trip. She describes a productive cough with significant expectoration and a runny nose. The patient had been feeling better after a course of doxycycline and steroids, but symptoms returned after the camping trip now 6 weeks later  In addition to the pulmonary symptoms, the patient is scheduled for a surgical procedure to address a bladder issue. The procedure involves tacking the bladder to the spine and will be performed robotically. The patient expresses some anxiety about the procedure but is hopeful for a positive outcome.     Upcoming apt w/ Pulmonology office on Fri 11/1   Denies any known or suspected exposure to person with or possibly with COVID19.       10/13/2023    2:57 PM 08/26/2023    1:58 PM 08/08/2023    9:10 AM  Depression screen PHQ 2/9  Decreased Interest 0 0 0  Down, Depressed, Hopeless 0 0 0  PHQ - 2 Score 0 0 0  Altered sleeping 0 0 0  Tired, decreased energy 0 0 0  Change in appetite 0 0 0  Feeling bad or failure about yourself  0 0 0  Trouble concentrating 0 0 0  Moving slowly or fidgety/restless 0 0 0  Suicidal thoughts 0 0 0  PHQ-9 Score 0 0 0  Difficult doing work/chores  Not difficult at all Not difficult at all    Social History   Tobacco Use   Smoking status: Former    Current packs/day: 0.00    Average packs/day: 1 pack/day for 30.0 years (30.0 ttl pk-yrs)    Types: Cigarettes    Start date: 03/02/1988    Quit date: 03/02/2018    Years since quitting: 5.6    Smokeless tobacco: Never   Tobacco comments:    0.5 to 1 ppd >30 years  Vaping Use   Vaping status: Never Used  Substance Use Topics   Alcohol use: No   Drug use: Yes    Types: Oxycodone    Review of Systems Per HPI unless specifically indicated above     Objective:    BP (!) 140/76   Pulse 92   Ht 5\' 6"  (1.676 m)   Wt 132 lb (59.9 kg)   SpO2 95%   BMI 21.31 kg/m   Wt Readings from Last 3 Encounters:  10/13/23 132 lb (59.9 kg)  08/26/23 127 lb 9.6 oz (57.9 kg)  08/08/23 129 lb (58.5 kg)    Physical Exam Vitals and nursing note reviewed.  Constitutional:      General: She is not in acute distress.    Appearance: She is well-developed. She is not diaphoretic.     Comments: Currently tired and ill appearing but she is comfortable, cooperative  HENT:     Head: Normocephalic and atraumatic.  Eyes:     General:        Right eye: No discharge.        Left eye: No discharge.     Conjunctiva/sclera: Conjunctivae normal.  Neck:     Thyroid: No thyromegaly.  Cardiovascular:     Rate and Rhythm: Normal rate and regular rhythm.     Heart sounds: Normal heart sounds. No murmur heard. Pulmonary:     Effort: Pulmonary effort is normal. No respiratory distress.     Breath sounds: Wheezing present. No rales.     Comments: cough Musculoskeletal:        General: Normal range of motion.     Cervical back: Normal range of motion and neck supple.  Lymphadenopathy:     Cervical: No cervical adenopathy.  Skin:    General: Skin is warm and dry.     Findings: No erythema or rash.  Neurological:     Mental Status: She is alert and oriented to person, place, and time.  Psychiatric:        Behavior: Behavior normal.     Comments: Well groomed, good eye contact, normal speech and thoughts    Results for orders placed or performed during the hospital encounter of 10/28/22  Resp Panel by RT-PCR (Flu A&B, Covid) Urine, Clean Catch   Specimen: Urine, Clean Catch; Nasal Swab  Result  Value Ref Range   SARS Coronavirus 2 by RT PCR POSITIVE (A) NEGATIVE   Influenza A by PCR NEGATIVE NEGATIVE   Influenza B by PCR NEGATIVE NEGATIVE  Lipase, blood  Result Value Ref Range   Lipase 31 11 - 51 U/L  Comprehensive metabolic panel  Result Value Ref Range   Sodium 138 135 - 145 mmol/L   Potassium 3.6 3.5 - 5.1 mmol/L   Chloride 104 98 - 111 mmol/L   CO2 24 22 - 32 mmol/L   Glucose, Bld 129 (H) 70 - 99 mg/dL   BUN 8 6 - 20 mg/dL   Creatinine, Ser 4.40 0.44 - 1.00 mg/dL   Calcium 9.5 8.9 - 10.2 mg/dL   Total Protein 7.2 6.5 - 8.1 g/dL   Albumin 3.5 3.5 - 5.0 g/dL   AST 77 (H) 15 - 41 U/L   ALT 30 0 - 44 U/L   Alkaline Phosphatase 150 (H) 38 - 126 U/L   Total Bilirubin 0.8 0.3 - 1.2 mg/dL   GFR, Estimated >72 >53 mL/min   Anion gap 10 5 - 15  CBC  Result Value Ref Range   WBC 11.1 (H) 4.0 - 10.5 K/uL   RBC 4.36 3.87 - 5.11 MIL/uL   Hemoglobin 15.0 12.0 - 15.0 g/dL   HCT 66.4 40.3 - 47.4 %   MCV 96.8 80.0 - 100.0 fL   MCH 34.4 (H) 26.0 - 34.0 pg   MCHC 35.5 30.0 - 36.0 g/dL   RDW 25.9 56.3 - 87.5 %   Platelets 277 150 - 400 K/uL   nRBC 0.0 0.0 - 0.2 %  Urinalysis, Routine w reflex microscopic Urine, Clean Catch  Result Value Ref Range   Color, Urine STRAW (A) YELLOW   APPearance CLEAR (A) CLEAR   Specific Gravity, Urine 1.008 1.005 - 1.030   pH 6.0 5.0 - 8.0   Glucose, UA NEGATIVE NEGATIVE mg/dL   Hgb urine dipstick MODERATE (A) NEGATIVE   Bilirubin Urine NEGATIVE NEGATIVE   Ketones, ur NEGATIVE NEGATIVE mg/dL   Protein, ur NEGATIVE NEGATIVE mg/dL   Nitrite NEGATIVE NEGATIVE   Leukocytes,Ua NEGATIVE NEGATIVE   RBC / HPF 0-5 0 - 5 RBC/hpf   WBC, UA 6-10 0 - 5 WBC/hpf   Bacteria, UA NONE SEEN NONE SEEN   Squamous Epithelial / HPF  0-5 0 - 5   Mucus PRESENT   Phosphorus  Result Value Ref Range   Phosphorus 2.7 2.5 - 4.6 mg/dL  Magnesium  Result Value Ref Range   Magnesium 1.8 1.7 - 2.4 mg/dL  Procalcitonin - Baseline  Result Value Ref Range    Procalcitonin <0.10 ng/mL      Assessment & Plan:   Problem List Items Addressed This Visit   None Visit Diagnoses     COPD with acute exacerbation (HCC)    -  Primary   Relevant Medications   chlorpheniramine-HYDROcodone (TUSSIONEX) 10-8 MG/5ML   levofloxacin (LEVAQUIN) 500 MG tablet   predniSONE (DELTASONE) 20 MG tablet   Antibiotic-induced yeast infection       Relevant Medications   fluconazole (DIFLUCAN) 150 MG tablet       Assessment and Plan    AECOPD Recent improvement followed by relapse after exposure to cold weather. Productive cough and rhinorrhea noted. Previously treated with doxycycline and steroids 6 weeks ago Start taking Levaquin antibiotic 500mg  daily x 7 days Proceed w/ Prednisone taper repeat -Refill Diflucan and Tussionex cough syrup.  Upcoming Surgery Patient scheduled for bladder tacking procedure on 10/13/2023 with Dr. Tereasa Coop. -Review post-operative notes when available.  Follow-up with pulmonology on 10/17/2023.        Meds ordered this encounter  Medications   fluconazole (DIFLUCAN) 150 MG tablet    Sig: Take 1 tablet (150 mg total) by mouth every other day.    Dispense:  6 tablet    Refill:  3   chlorpheniramine-HYDROcodone (TUSSIONEX) 10-8 MG/5ML    Sig: Take 5 mLs by mouth every 12 (twelve) hours as needed for cough.    Dispense:  140 mL    Refill:  0   levofloxacin (LEVAQUIN) 500 MG tablet    Sig: Take 1 tablet (500 mg total) by mouth daily. For 7 days    Dispense:  7 tablet    Refill:  0   predniSONE (DELTASONE) 20 MG tablet    Sig: Take 2 tablets daily (40mg ) for 4 days, take 1 tab daily (20mg ) for 4 days, take half tab daily (10mg ) for 4 days    Dispense:  14 tablet    Refill:  0      Follow up plan: Return if symptoms worsen or fail to improve.   Saralyn Pilar, DO [facility] 10/13/2023, 3:31 PM

## 2023-10-17 ENCOUNTER — Ambulatory Visit (INDEPENDENT_AMBULATORY_CARE_PROVIDER_SITE_OTHER): Payer: Medicare HMO | Admitting: Primary Care

## 2023-10-17 VITALS — BP 110/68 | HR 104 | Temp 97.8°F | Ht 66.0 in | Wt 130.2 lb

## 2023-10-17 DIAGNOSIS — Z87891 Personal history of nicotine dependence: Secondary | ICD-10-CM

## 2023-10-17 DIAGNOSIS — J449 Chronic obstructive pulmonary disease, unspecified: Secondary | ICD-10-CM

## 2023-10-17 MED ORDER — TRELEGY ELLIPTA 200-62.5-25 MCG/ACT IN AEPB: 1.0000 | INHALATION_SPRAY | Freq: Every day | RESPIRATORY_TRACT | 5 refills | Status: DC

## 2023-10-17 NOTE — Patient Instructions (Addendum)
Recommendations: Increase Trelegy one puff daily  Continue Daliresp  Refer you to lung cancer screening program  Follow-up: 6 months Dr. Belia Heman   Chronic Obstructive Pulmonary Disease  Chronic obstructive pulmonary disease (COPD) is a long-term (chronic) lung problem. When you have COPD, it is hard for air to get in and out of your lungs. Usually the condition gets worse over time, and your lungs will never return to normal. There are things you can do to keep yourself as healthy as possible. What are the causes? Smoking. This is the most common cause. Certain genes passed from parent to child (inherited). What increases the risk? Being exposed to secondhand smoke from cigarettes, pipes, or cigars. Being exposed to chemicals and other irritants, such as fumes and dust in the work environment. Having chronic lung conditions or infections. What are the signs or symptoms? Shortness of breath, especially during physical activity. A long-term cough with a large amount of thick mucus. Sometimes, the cough may not have any mucus (dry cough). Wheezing. Breathing quickly. Skin that looks gray or blue, especially in the fingers, toes, or lips. Feeling tired (fatigue). Weight loss. Chest tightness. Having infections often. Episodes when breathing symptoms become much worse (exacerbations). At the later stages of this disease, you may have swelling in the ankles, feet, or legs. How is this treated? Taking medicines. Quitting smoking, if you smoke. Rehabilitation. This includes steps to make your body work better. It may involve a team of specialists. Doing exercises. Making changes to your diet. Using oxygen. Lung surgery. Lung transplant. Comfort measures (palliative care). Follow these instructions at home: Medicines Take over-the-counter and prescription medicines only as told by your doctor. Talk to your doctor before taking any cough or allergy medicines. You may need  to avoid medicines that cause your lungs to be dry. Lifestyle If you smoke, stop smoking. Smoking makes the problem worse. Do not smoke or use any products that contain nicotine or tobacco. If you need help quitting, ask your doctor. Avoid being around things that make your breathing worse. This may include smoke, chemicals, and fumes. Stay active, but remember to rest as well. Learn and use tips on how to manage stress and control your breathing. Make sure you get enough sleep. Most adults need at least 7 hours of sleep every night. Eat healthy foods. Eat smaller meals more often. Rest before meals. Controlled breathing Learn and use tips on how to control your breathing as told by your doctor. Try: Breathing in (inhaling) through your nose for 1 second. Then, pucker your lips and breath out (exhale) through your lips for 2 seconds. Putting one hand on your belly (abdomen). Breathe in slowly through your nose for 1 second. Your hand on your belly should move out. Pucker your lips and breathe out slowly through your lips. Your hand on your belly should move in as you breathe out.  Controlled coughing Learn and use controlled coughing to clear mucus from your lungs. Follow these steps: Lean your head a little forward. Breathe in deeply. Try to hold your breath for 3 seconds. Keep your mouth slightly open while coughing 2 times. Spit any mucus out into a tissue. Rest and do the steps again 1 or 2 times as needed. General instructions Make sure you get all the shots (vaccines) that your doctor recommends. Ask your doctor about a flu shot and a pneumonia shot. Use oxygen therapy and pulmonary rehabilitation if told by your doctor. If you need home oxygen  therapy, ask your doctor if you should buy a tool to measure your oxygen level (oximeter). Make a COPD action plan with your doctor. This helps you to know what to do if you feel worse than usual. Manage any other conditions you have as told by  your doctor. Avoid going outside when it is very hot, cold, or humid. Avoid people who have a sickness you can catch (contagious). Keep all follow-up visits. Contact a doctor if: You cough up more mucus than usual. There is a change in the color or thickness of the mucus. It is harder to breathe than usual. Your breathing is faster than usual. You have trouble sleeping. You need to use your medicines more often than usual. You have trouble doing your normal activities such as getting dressed or walking around the house. Get help right away if: You have shortness of breath while resting. You have shortness of breath that stops you from: Being able to talk. Doing normal activities. Your chest hurts for longer than 5 minutes. Your skin color is more blue than usual. Your pulse oximeter shows that you have low oxygen for longer than 5 minutes. You have a fever. You feel too tired to breathe normally. These symptoms may represent a serious problem that is an emergency. Do not wait to see if the symptoms will go away. Get medical help right away. Call your local emergency services (911 in the [place] Do not drive yourself to the hospital. Summary Chronic obstructive pulmonary disease (COPD) is a long-term lung problem. The way your lungs work will never return to normal. Usually the condition gets worse over time. There are things you can do to keep yourself as healthy as possible. Take over-the-counter and prescription medicines only as told by your doctor. If you smoke, stop. Smoking makes the problem worse. This information is not intended to replace advice given to you by your health care provider. Make sure you discuss any questions you have with your health care provider. Document Revised: 10/09/2020 Document Reviewed: 10/10/2020 Elsevier Patient Education  2024 [employer].

## 2023-10-17 NOTE — Progress Notes (Signed)
@Patient  ID: Caroline Conley, female    DOB: [birth year], 57 y.o.   MRN: [id number]  No chief complaint on file.   Referring provider: Saralyn Pilar *  Discussed the use of AI scribe software for clinical note transcription with the patient, who gave verbal consent to proceed.  History of Present Illness   57 year old former smoker. PMH significant for hypertension, chronic heart failure, nonischemic cardiomyopathy, ventricular fibrillation, chronic obstructive pulmonary disease, emphysema, GERD.  10/17/2023 Patient presents today for regular OV. She is followed by our office for history of COPD and emphysema. She is doing well today. She has some concerns about the efficacy of her current inhaler, Trelegy. She reports that while initially the medication was highly effective, over time she has noticed a decrease in its effectiveness. The patient describes a sensation of her body becoming accustomed to the medication, resulting in a perceived reduction in its ability to manage her symptoms. She reports that she no longer feels as though she could "run a marathon" after taking her morning dose of Trelegy, as she initially did when starting the medication.  She is not currently on oxygen therapy.   She had a recent illness following a camping trip. She was coughing up yellow mucus. She was prescribed an antibiotic (Levaquin) and prednisone by her primary care physician. The patient reports improvement in her cough since starting these medications.  The patient also mentions being on Daliresp (Roflumilast) 500 mcg daily, which she acknowledges is to help reduce the frequency of her COPD flare-ups. She reports that despite being on this medication, she still experiences flare-ups approximately every 8 to 12 weeks and is wondering if dose can be increased. We advised her that it can not be increased and discussed starting daily azithromycin to prevent flare ups. She would like to hold off on this  due to concerns for potential cardiac arrhythmias with Azithromycin.   The patient is a former smoker, having quit in 2019. She is interested in participating in lung cancer screening program due to smoking history. She reports being up-to-date with her vaccines, including the flu vaccine, COVID-19 booster, and pneumonia vaccine.       Allergies  Allergen Reactions   Hydrocodone-Acetaminophen Nausea And Vomiting    Other Reaction(s): Other (See Comments)  GI distress   Vicodin [Hydrocodone-Acetaminophen] Nausea And Vomiting    GI distress   Morphine Itching    Other Reaction(s): Other (See Comments)    Immunization History  Administered Date(s) Administered   Influenza Inj Mdck Quad With Preservative 10/22/2019   Influenza, Seasonal, Injecte, Preservative Fre 08/26/2023   Influenza,inj,Quad PF,6+ Mos 08/29/2016, 08/06/2017, 10/20/2018   Influenza,inj,quad, With Preservative 09/15/2017, 09/16/2019   Influenza-Unspecified 07/26/2022   PFIZER Comirnaty(Gray Top)Covid-19 Tri-Sucrose Vaccine 03/14/2020, 04/04/2020   PFIZER(Purple Top)SARS-COV-2 Vaccination 03/14/2020, 04/04/2020   PNEUMOCOCCAL CONJUGATE-20 07/26/2022   Pneumococcal-Unspecified 09/15/2017   Tdap 08/29/2016, 03/09/2017   Unspecified SARS-COV-2 Vaccination 04/04/2020    Past Medical History:  Diagnosis Date   Atypical chest pain    a. 2015 Cath ([facility]): Nl cors. Nl EF; b. 10/2018 MV: No ischemia/infarct. EF>65%.   COPD (chronic obstructive pulmonary disease) (HCC)    Coronary vasospasm (HCC)    a. 06/2021 Cath: LM nl, LADnl, LCX large/nl, RCA 35p (spasm-->tx w/ IC NTG w/ resultant VF req defib).   Dysplastic nevus 02/02/2015   R upper back - excision   Dysplastic nevus 02/16/2015   L hip - excision   Dysplastic nevus 08/24/2015   L  upper abdomen   Dysplastic nevus 08/11/2017   mid abdomen - excised   Dysplastic nevus 07/29/2017   Left neck   Full dentures    Gastritis    GERD (gastroesophageal reflux  disease)    HFrEF (heart failure with reduced ejection fraction) (HCC)    Hyperlipidemia    Iatrogenic ventricular fibrillation 07/02/2021   a.) during LHC --> coronary vasospasm Tx'd with IC NTG resulting in VF requiring defibrillation; no recurrence.   LBBB (left bundle branch block)    Melanoma (HCC) 08/24/2015   L buttocks - Breslow depth 0.69mm, Ulceration: absent, Mitotic index: 1 per HPF, TILs: Non-brisk, SLNbx: negative   NICM (nonischemic cardiomyopathy) (HCC)    a.) 08/04/2018: EF 40%; ant/anterosepal HK, GLS -13.7; mild-mod MR; G1DD.  b.) 06/22/2021 TTE: EF 40%, ant/antseptal/septal HK. G1DD. Nl RV fxn. Mild-mod MR; c.) 06/2021 LHC: Nl cors w/ RCA vasospasm (35%). VF w/ IC NTG admin req defib; d.) 07/02/2021 TTE: EF 35-40%. Glob HK. Mild MR. e.) TTE 02/07/2022: EF 35-40%; basal mid-anteroseptal AK; mild MR; G2DD.   Occlusion of right radial artery (HCC)    a. 06/2021 s/p cardiac cath-->u/s w/ R radial occlusion from puncture extending up entire forearm-->Lovenox 1mg /kg BID x 4 wks; b. 07/2021 U/S: Thrombus in prox Radial artery w/ minimal reconstitution of flow - improved w/ multiphasic waveforms in R brachial artery.   Palpitations    a. 07/2018 48h Holter: predominantly RSR, avg rate 106 (69-166 bpm), Rare PAC's and occas PVC's. 2 atrial runs up to 5 beats. No sustained arrhythmias or prolonged pauses. No symptoms reported; b. 07/2021 Zio: Avg HR 102 bpm, intermittent bundle branch block, rare PACs and PVCs, 9 atrial runs, longest 13.7 secs w/ max HR of 226 bpm.   Skin cancer, basal cell 01/2016   Wears dentures    full upper and lower    Tobacco History: Social History   Tobacco Use  Smoking Status Former   Current packs/day: 0.00   Average packs/day: 1 pack/day for 30.0 years (30.0 ttl pk-yrs)   Types: Cigarettes   Start date: 03/02/1988   Quit date: 03/02/2018   Years since quitting: 5.6  Smokeless Tobacco Never  Tobacco Comments   0.5 to 1 ppd >30 years   Counseling given:  Not Answered Tobacco comments: 0.5 to 1 ppd >30 years   Outpatient Medications Prior to Visit  Medication Sig Dispense Refill   albuterol (PROVENTIL) (2.5 MG/3ML) 0.083% nebulizer solution Take 3 mLs (2.5 mg total) by nebulization every 4 (four) hours as needed for shortness of breath or wheezing. 75 mL 12   albuterol (VENTOLIN HFA) 108 (90 Base) MCG/ACT inhaler INHALE 2 PUFFS BY MOUTH EVERY 4 HOURS AS NEEDED FOR WHEEZING 8.5 g 5   chlorpheniramine-HYDROcodone (TUSSIONEX) 10-8 MG/5ML Take 5 mLs by mouth every 12 (twelve) hours as needed for cough. 140 mL 0   fluconazole (DIFLUCAN) 150 MG tablet Take 1 tablet (150 mg total) by mouth every other day. 6 tablet 3   levofloxacin (LEVAQUIN) 500 MG tablet Take 1 tablet (500 mg total) by mouth daily. For 7 days 7 tablet 0   magic mouthwash w/lidocaine SOLN Swish, gargle, and spit one to two teaspoonfuls every six hours as needed. Shake well before using. 120 mL 0   metoprolol succinate (TOPROL-XL) 25 MG 24 hr tablet Take 0.5 tablets (12.5 mg total) by mouth daily. 45 tablet 3   Misc. Devices (PULSE OXIMETER FOR FINGER) MISC 1 Device by Does not apply route as needed (for  cough, dyspnea). Pulse oximeter for finger for checking oxygen saturation in COPD 1 each 0   omeprazole (PRILOSEC) 40 MG capsule TAKE 1 CAPSULE BY MOUTH DAILY BEFORE BREAKFAST 90 capsule 3   oxyCODONE-acetaminophen (PERCOCET) 10-325 MG tablet Take 1 tablet by mouth every 4 (four) hours.     predniSONE (DELTASONE) 20 MG tablet Take 2 tablets daily (40mg ) for 4 days, take 1 tab daily (20mg ) for 4 days, take half tab daily (10mg ) for 4 days 14 tablet 0   promethazine (PHENERGAN) 25 MG tablet Take 0.5-1 tablets (12.5-25 mg total) by mouth every 8 (eight) hours as needed for nausea or vomiting. 20 tablet 3   roflumilast (DALIRESP) 500 MCG TABS tablet TAKE 1 TABLET BY MOUTH DAILY 30 tablet 10   silver sulfADIAZINE (SILVADENE) 1 % cream Apply 1 Application topically daily. For 1-2 weeks then as  needed 50 g 0   tiZANidine (ZANAFLEX) 4 MG tablet Take 1 tablet (4 mg total) by mouth every 8 (eight) hours as needed for muscle spasms. 90 tablet 3   TRELEGY ELLIPTA 100-62.5-25 MCG/ACT AEPB INHALE 1 PUFF BY MOUTH DAILY RINSE MOUTH AFTER USE 60 each 11   No facility-administered medications prior to visit.    Review of Systems  Review of Systems  Constitutional: Negative.   Respiratory:  Positive for cough and wheezing. Negative for chest tightness and shortness of breath.   Cardiovascular: Negative.    Physical Exam  BP 110/68 (BP Location: Right Arm, Cuff Size: Normal)   Pulse (!) 104   Temp 97.8 F (36.6 C)   Ht 5\' 6"  (1.676 m)   Wt 130 lb 3.2 oz (59.1 kg)   SpO2 97%   BMI 21.01 kg/m  Physical Exam Constitutional:      General: She is not in acute distress.    Appearance: Normal appearance. She is not ill-appearing.  Cardiovascular:     Rate and Rhythm: Normal rate and regular rhythm.  Pulmonary:     Breath sounds: Wheezing present. No rhonchi.  Skin:    General: Skin is warm and dry.  Neurological:     General: No focal deficit present.     Mental Status: She is alert and oriented to person, place, and time. Mental status is at baseline.  Psychiatric:        Mood and Affect: Mood normal.        Behavior: Behavior normal.        Thought Content: Thought content normal.        Judgment: Judgment normal.      Lab Results:  CBC    Component Value Date/Time   WBC 11.1 (H) 10/28/2022 1844   RBC 4.36 10/28/2022 1844   HGB 15.0 10/28/2022 1844   HGB 12.8 02/07/2022 1103   HCT 42.2 10/28/2022 1844   HCT 36.6 02/07/2022 1103   PLT 277 10/28/2022 1844   PLT 294 02/07/2022 1103   MCV 96.8 10/28/2022 1844   MCV 98 (H) 02/07/2022 1103   MCV 94 09/23/2014 1708   MCH 34.4 (H) 10/28/2022 1844   MCHC 35.5 10/28/2022 1844   RDW 11.9 10/28/2022 1844   RDW 12.5 02/07/2022 1103   RDW 12.8 09/23/2014 1708   LYMPHSABS 1.9 02/07/2022 1103   LYMPHSABS 3.0 04/15/2012  1653   MONOABS 1.4 (H) 01/20/2022 1029   MONOABS 0.7 04/15/2012 1653   EOSABS 0.2 02/07/2022 1103   EOSABS 0.2 04/15/2012 1653   BASOSABS 0.1 02/07/2022 1103   BASOSABS 0.0 04/15/2012 1653  BMET    Component Value Date/Time   NA 138 10/28/2022 1844   NA 136 02/07/2022 1103   NA 140 09/23/2014 1708   K 3.6 10/28/2022 1844   K 3.6 09/23/2014 1708   CL 104 10/28/2022 1844   CL 107 09/23/2014 1708   CO2 24 10/28/2022 1844   CO2 26 09/23/2014 1708   GLUCOSE 129 (H) 10/28/2022 1844   GLUCOSE 92 09/23/2014 1708   BUN 8 10/28/2022 1844   BUN 7 02/07/2022 1103   BUN 5 (L) 09/23/2014 1708   CREATININE 0.98 10/28/2022 1844   CREATININE 1.16 (H) 12/31/2018 0929   CALCIUM 9.5 10/28/2022 1844   CALCIUM 8.5 09/23/2014 1708   GFRNONAA >60 10/28/2022 1844   GFRNONAA 62 07/29/2017 1229   GFRAA >60 05/23/2018 0438   GFRAA 72 07/29/2017 1229    BNP No results found for: "BNP"  ProBNP No results found for: "PROBNP"  Imaging: No results found.   Assessment & Plan:    1. Former smoker - Ambulatory Referral for Lung Cancer Scre  2. Chronic obstructive pulmonary disease, unspecified COPD type (HCC) - Rx Trelegy one puff daily  Chronic Obstructive Pulmonary Disease (COPD) Moderate obstructive airway disease with emphysema. Patient reports decreased efficacy of current Trelegy inhaler. No current oxygen use. Recent acute exacerbation treated with Levaquin and prednisone by primary care provider with improvement  -Increase Trelegy to one puff daily  -Continue Roflumilast 500 mcg daily. -Complete current course of Levaquin and prednisone until complet -Return in 6 months for follow-up, sooner if symptoms worsen.  Lung Cancer Screening Long history of smoking, quit in 2019. Last CT scan in 2023 showed no suspicious pulmonary nodules. -Refer to lung cancer screening program for annual low-dose CT scan.  General Health Maintenance Up to date on vaccines including  flu, COVID-19 booster, and pneumonia. -Continue current vaccination schedule.      Glenford Bayley, NP 10/17/2023

## 2023-10-23 DIAGNOSIS — N819 Female genital prolapse, unspecified: Secondary | ICD-10-CM | POA: Diagnosis not present

## 2023-10-30 DIAGNOSIS — I428 Other cardiomyopathies: Secondary | ICD-10-CM | POA: Diagnosis not present

## 2023-10-30 DIAGNOSIS — J449 Chronic obstructive pulmonary disease, unspecified: Secondary | ICD-10-CM | POA: Diagnosis not present

## 2023-10-30 DIAGNOSIS — I2510 Atherosclerotic heart disease of native coronary artery without angina pectoris: Secondary | ICD-10-CM | POA: Diagnosis not present

## 2023-10-30 DIAGNOSIS — Z01818 Encounter for other preprocedural examination: Secondary | ICD-10-CM | POA: Diagnosis not present

## 2023-10-30 DIAGNOSIS — I4901 Ventricular fibrillation: Secondary | ICD-10-CM | POA: Diagnosis not present

## 2023-10-30 DIAGNOSIS — R059 Cough, unspecified: Secondary | ICD-10-CM | POA: Diagnosis not present

## 2023-11-10 DIAGNOSIS — N8110 Cystocele, unspecified: Secondary | ICD-10-CM | POA: Diagnosis not present

## 2023-11-10 DIAGNOSIS — I447 Left bundle-branch block, unspecified: Secondary | ICD-10-CM | POA: Diagnosis not present

## 2023-11-10 DIAGNOSIS — M549 Dorsalgia, unspecified: Secondary | ICD-10-CM | POA: Diagnosis not present

## 2023-11-10 DIAGNOSIS — N3946 Mixed incontinence: Secondary | ICD-10-CM | POA: Diagnosis not present

## 2023-11-10 DIAGNOSIS — N838 Other noninflammatory disorders of ovary, fallopian tube and broad ligament: Secondary | ICD-10-CM | POA: Diagnosis not present

## 2023-11-10 DIAGNOSIS — K219 Gastro-esophageal reflux disease without esophagitis: Secondary | ICD-10-CM | POA: Diagnosis not present

## 2023-11-10 DIAGNOSIS — J449 Chronic obstructive pulmonary disease, unspecified: Secondary | ICD-10-CM | POA: Diagnosis not present

## 2023-11-10 DIAGNOSIS — G8929 Other chronic pain: Secondary | ICD-10-CM | POA: Diagnosis not present

## 2023-11-10 DIAGNOSIS — N819 Female genital prolapse, unspecified: Secondary | ICD-10-CM | POA: Diagnosis not present

## 2023-11-10 DIAGNOSIS — I509 Heart failure, unspecified: Secondary | ICD-10-CM | POA: Diagnosis not present

## 2023-11-10 DIAGNOSIS — Z87891 Personal history of nicotine dependence: Secondary | ICD-10-CM | POA: Diagnosis not present

## 2023-12-08 ENCOUNTER — Other Ambulatory Visit: Payer: Self-pay

## 2023-12-08 DIAGNOSIS — Z87891 Personal history of nicotine dependence: Secondary | ICD-10-CM

## 2023-12-08 DIAGNOSIS — Z122 Encounter for screening for malignant neoplasm of respiratory organs: Secondary | ICD-10-CM

## 2023-12-12 ENCOUNTER — Other Ambulatory Visit: Payer: Self-pay | Admitting: Internal Medicine

## 2023-12-12 ENCOUNTER — Ambulatory Visit (INDEPENDENT_AMBULATORY_CARE_PROVIDER_SITE_OTHER): Payer: Medicare HMO | Admitting: Family Medicine

## 2023-12-12 ENCOUNTER — Other Ambulatory Visit: Payer: Self-pay | Admitting: Family Medicine

## 2023-12-12 ENCOUNTER — Encounter: Payer: Self-pay | Admitting: Family Medicine

## 2023-12-12 VITALS — BP 122/80 | HR 91 | Ht 66.0 in | Wt 128.0 lb

## 2023-12-12 DIAGNOSIS — J441 Chronic obstructive pulmonary disease with (acute) exacerbation: Secondary | ICD-10-CM | POA: Diagnosis not present

## 2023-12-12 DIAGNOSIS — T23111A Burn of first degree of right thumb (nail), initial encounter: Secondary | ICD-10-CM

## 2023-12-12 DIAGNOSIS — J449 Chronic obstructive pulmonary disease, unspecified: Secondary | ICD-10-CM

## 2023-12-12 MED ORDER — PREDNISONE 20 MG PO TABS: ORAL_TABLET | ORAL | 0 refills | Status: DC

## 2023-12-12 MED ORDER — HYDROCOD POLI-CHLORPHE POLI ER 10-8 MG/5ML PO SUER: 5.0000 mL | Freq: Two times a day (BID) | ORAL | 0 refills | Status: DC | PRN

## 2023-12-12 MED ORDER — DOXYCYCLINE HYCLATE 100 MG PO TABS: 100.0000 mg | ORAL_TABLET | Freq: Two times a day (BID) | ORAL | 0 refills | Status: DC

## 2023-12-12 NOTE — Progress Notes (Signed)
Subjective:    Patient ID: Caroline Conley, female    DOB: 09-28-1966, 57 y.o.   MRN: [id number]  Caroline Conley is a 57 y.o. female presenting on 12/12/2023 for Cough (Sore throat couple days )   HPI  Discussed the use of AI scribe software for clinical note transcription with the patient, who gave verbal consent to proceed.  History of Present Illness    The patient, with a history of COPD, presents with a flare-up of her condition, characterized by lung congestion, wheezing, and a sore throat. She denies having any fevers. The patient's last pulmonology appointment was on November 1st, during which her Trelegy dosage was increased from 100mg  to 200mg . The patient reports that her symptoms have worsened over the past two months, attributing the exacerbation to standing in the cold for over two hours during a parade. She describes the sputum as being 'extremely sticky,' which is a change from the usual 'slimy' consistency.  The patient has a history of responding well to doxycycline and prednisone in past. Last treatment 2 months ago on Levaquin.   Admits raw sore throat from coughing.  Additionally, The patient recently underwent bladder surgery, per [facility], bladder procedure to treat prolapse     Next Pulmonology apt 12/23/23       10/13/2023    2:57 PM 08/26/2023    1:58 PM 08/08/2023    9:10 AM  Depression screen PHQ 2/9  Decreased Interest 0 0 0  Down, Depressed, Hopeless 0 0 0  PHQ - 2 Score 0 0 0  Altered sleeping 0 0 0  Tired, decreased energy 0 0 0  Change in appetite 0 0 0  Feeling bad or failure about yourself  0 0 0  Trouble concentrating 0 0 0  Moving slowly or fidgety/restless 0 0 0  Suicidal thoughts 0 0 0  PHQ-9 Score 0 0 0  Difficult doing work/chores  Not difficult at all Not difficult at all       10/13/2023    2:58 PM 08/26/2023    1:58 PM 08/08/2023    9:11 AM 04/03/2023    9:25 AM  GAD 7 : Generalized Anxiety Score  Nervous, Anxious,  on Edge 0 0 0 1  Control/stop worrying 0 0 0 0  Worry too much - different things 0 0 0 0  Trouble relaxing 0 0 0 0  Restless 0 0 0 0  Easily annoyed or irritable 0 0 0 0  Afraid - awful might happen 0 0 0 0  Total GAD 7 Score 0 0 0 1  Anxiety Difficulty   Not difficult at all     Social History   Tobacco Use   Smoking status: Former    Current packs/day: 0.00    Average packs/day: 1 pack/day for 30.0 years (30.0 ttl pk-yrs)    Types: Cigarettes    Start date: 03/02/1988    Quit date: 03/02/2018    Years since quitting: 5.7   Smokeless tobacco: Never   Tobacco comments:    0.5 to 1 ppd >30 years  Vaping Use   Vaping status: Never Used  Substance Use Topics   Alcohol use: No   Drug use: Yes    Types: Oxycodone    Review of Systems Per HPI unless specifically indicated above     Objective:    BP 122/80   Pulse 91   Ht 5\' 6"  (1.676 m)   Wt 128 lb (58.1 kg)  SpO2 99%   BMI 20.66 kg/m   Wt Readings from Last 3 Encounters:  12/12/23 128 lb (58.1 kg)  10/17/23 130 lb 3.2 oz (59.1 kg)  10/13/23 132 lb (59.9 kg)    Physical Exam Vitals and nursing note reviewed.  Constitutional:      General: She is not in acute distress.    Appearance: She is well-developed. She is not diaphoretic.     Comments: Well-appearing, comfortable, cooperative  HENT:     Head: Normocephalic and atraumatic.  Eyes:     General:        Right eye: No discharge.        Left eye: No discharge.     Conjunctiva/sclera: Conjunctivae normal.  Neck:     Thyroid: No thyromegaly.  Cardiovascular:     Rate and Rhythm: Normal rate and regular rhythm.     Heart sounds: Normal heart sounds. No murmur heard. Pulmonary:     Effort: Pulmonary effort is normal. No respiratory distress.     Breath sounds: Wheezing present. No rales.  Musculoskeletal:        General: Normal range of motion.     Cervical back: Normal range of motion and neck supple.  Lymphadenopathy:     Cervical: No cervical  adenopathy.  Skin:    General: Skin is warm and dry.     Findings: No erythema or rash.  Neurological:     Mental Status: She is alert and oriented to person, place, and time.  Psychiatric:        Behavior: Behavior normal.     Comments: Well groomed, good eye contact, normal speech and thoughts     Results for orders placed or performed during the hospital encounter of 10/28/22  Lipase, blood   Collection Time: 10/28/22  6:44 PM  Result Value Ref Range   Lipase 31 11 - 51 U/L  Comprehensive metabolic panel   Collection Time: 10/28/22  6:44 PM  Result Value Ref Range   Sodium 138 135 - 145 mmol/L   Potassium 3.6 3.5 - 5.1 mmol/L   Chloride 104 98 - 111 mmol/L   CO2 24 22 - 32 mmol/L   Glucose, Bld 129 (H) 70 - 99 mg/dL   BUN 8 6 - 20 mg/dL   Creatinine, Ser 6.57 0.44 - 1.00 mg/dL   Calcium 9.5 8.9 - 84.6 mg/dL   Total Protein 7.2 6.5 - 8.1 g/dL   Albumin 3.5 3.5 - 5.0 g/dL   AST 77 (H) 15 - 41 U/L   ALT 30 0 - 44 U/L   Alkaline Phosphatase 150 (H) 38 - 126 U/L   Total Bilirubin 0.8 0.3 - 1.2 mg/dL   GFR, Estimated >96 >29 mL/min   Anion gap 10 5 - 15  CBC   Collection Time: 10/28/22  6:44 PM  Result Value Ref Range   WBC 11.1 (H) 4.0 - 10.5 K/uL   RBC 4.36 3.87 - 5.11 MIL/uL   Hemoglobin 15.0 12.0 - 15.0 g/dL   HCT 52.8 41.3 - 24.4 %   MCV 96.8 80.0 - 100.0 fL   MCH 34.4 (H) 26.0 - 34.0 pg   MCHC 35.5 30.0 - 36.0 g/dL   RDW 01.0 27.2 - 53.6 %   Platelets 277 150 - 400 K/uL   nRBC 0.0 0.0 - 0.2 %  Phosphorus   Collection Time: 10/28/22  6:44 PM  Result Value Ref Range   Phosphorus 2.7 2.5 - 4.6 mg/dL  Magnesium  Collection Time: 10/28/22  6:44 PM  Result Value Ref Range   Magnesium 1.8 1.7 - 2.4 mg/dL  Procalcitonin - Baseline   Collection Time: 10/28/22  6:44 PM  Result Value Ref Range   Procalcitonin <0.10 ng/mL  Resp Panel by RT-PCR (Flu A&B, Covid) Urine, Clean Catch   Collection Time: 10/28/22  8:03 PM   Specimen: Urine, Clean Catch; Nasal Swab   Result Value Ref Range   SARS Coronavirus 2 by RT PCR POSITIVE (A) NEGATIVE   Influenza A by PCR NEGATIVE NEGATIVE   Influenza B by PCR NEGATIVE NEGATIVE  Urinalysis, Routine w reflex microscopic Urine, Clean Catch   Collection Time: 10/28/22  8:03 PM  Result Value Ref Range   Color, Urine STRAW (A) YELLOW   APPearance CLEAR (A) CLEAR   Specific Gravity, Urine 1.008 1.005 - 1.030   pH 6.0 5.0 - 8.0   Glucose, UA NEGATIVE NEGATIVE mg/dL   Hgb urine dipstick MODERATE (A) NEGATIVE   Bilirubin Urine NEGATIVE NEGATIVE   Ketones, ur NEGATIVE NEGATIVE mg/dL   Protein, ur NEGATIVE NEGATIVE mg/dL   Nitrite NEGATIVE NEGATIVE   Leukocytes,Ua NEGATIVE NEGATIVE   RBC / HPF 0-5 0 - 5 RBC/hpf   WBC, UA 6-10 0 - 5 WBC/hpf   Bacteria, UA NONE SEEN NONE SEEN   Squamous Epithelial / HPF 0-5 0 - 5   Mucus PRESENT       Assessment & Plan:   Problem List Items Addressed This Visit   None Visit Diagnoses       COPD with acute exacerbation (HCC)    -  Primary   Relevant Medications   chlorpheniramine-HYDROcodone (TUSSIONEX) 10-8 MG/5ML   doxycycline (VIBRA-TABS) 100 MG tablet   predniSONE (DELTASONE) 20 MG tablet         COPD exacerbation Increased lung congestion, wheezing, and sore throat. No fever. Last exacerbation 2 month ago, treated with Levaquin, steroid taper, syrup, and Diflucan two months ago. Noted change in sputum consistency to thick and sticky.  -Start Doxycycline for possible bacterial infection. -Start Prednisone taper for inflammation. -Tussionex cough syrup -Continue current COPD medications including increased dose of Trelegy (200mg ). -Check-in with pulmonology on 12/23/2023.  Recent bladder surgery Successful bladder repair and removal of damaged fallopian tube. No current issues reported. -No immediate action needed. Continue to monitor recovery.      No orders of the defined types were placed in this encounter.   Meds ordered this encounter  Medications    chlorpheniramine-HYDROcodone (TUSSIONEX) 10-8 MG/5ML    Sig: Take 5 mLs by mouth every 12 (twelve) hours as needed for cough.    Dispense:  140 mL    Refill:  0   doxycycline (VIBRA-TABS) 100 MG tablet    Sig: Take 1 tablet (100 mg total) by mouth 2 (two) times daily. For 10 days. Take with full glass of water, stay upright 30 min after taking.    Dispense:  20 tablet    Refill:  0   predniSONE (DELTASONE) 20 MG tablet    Sig: Take 2 tablets daily (40mg ) for 4 days, take 1 tab daily (20mg ) for 4 days, take half tab daily (10mg ) for 4 days    Dispense:  14 tablet    Refill:  0    Follow up plan: Return if symptoms worsen or fail to improve.    Saralyn Pilar, DO [facility] 12/12/2023, 3:41 PM

## 2023-12-12 NOTE — Patient Instructions (Addendum)
Thank you for coming to the office today.  Treatment for acute COPD flare  Start taking Doxycycline antibiotic 100mg  twice daily for 10 days. Take with full glass of water and stay upright for at least 30 min after taking, may be seated or standing, but should NOT lay down. This is just a safety precaution, if this medicine does not go all the way down throat well it could cause some burning discomfort to throat and esophagus. Also NOTE - do not take medicine within 2 hours (before or after) consuming dairy or foods / vitamins containing high calcium or iron.  Prednisone taper  Tussionex  Refill Diflucan at pharmacy  Please schedule a Follow-up Appointment to: Return if symptoms worsen or fail to improve.  If you have any other questions or concerns, please feel free to call the office or send a message through MyChart. You may also schedule an earlier appointment if necessary.  Additionally, you may be receiving a survey about your experience at our office within a few days to 1 week by e-mail or mail. We value your feedback.  Saralyn Pilar, DO [facility], [place]

## 2023-12-18 NOTE — Telephone Encounter (Signed)
 Requested medication (s) are due for refill today:   Provider to review  Requested medication (s) are on the active medication list:   Yes  Future visit scheduled:   No   Last ordered: 08/26/2023 50 g, 0 refills  Unable to refill because no protocol assigned.     Requested Prescriptions  Pending Prescriptions Disp Refills   silver  sulfADIAZINE  (SILVADENE ) 1 % cream [Pharmacy Med Name: SILVER  SULFADIAZINE  1% CREAM] 50 g 0    Sig: Apply 1 Application topically daily. For 1-2 weeks then as needed     Off-Protocol Failed - 12/18/2023  8:20 AM      Failed - Medication not assigned to a protocol, review manually.      Passed - Valid encounter within last 12 months    Recent Outpatient Visits           6 days ago COPD with acute exacerbation [facility])   [place] [facility] [place], Marsa PARAS, DO   2 months ago COPD with acute exacerbation [facility])   [place] [facility] [place] Marsa PARAS, DO   3 months ago COPD with acute exacerbation [facility])   [place] [facility] [place] Marsa PARAS, DO   4 months ago Pain in joint of right shoulder   [place] [facility] [place] Marsa PARAS, DO   5 months ago COPD with acute exacerbation [facility])   [place] [facility] [place], Marsa PARAS, [place]

## 2023-12-23 ENCOUNTER — Ambulatory Visit (INDEPENDENT_AMBULATORY_CARE_PROVIDER_SITE_OTHER): Payer: Medicare HMO | Admitting: Acute Care

## 2023-12-23 DIAGNOSIS — Z87891 Personal history of nicotine dependence: Secondary | ICD-10-CM

## 2023-12-23 NOTE — Progress Notes (Signed)
 Provider Attestation I agree with the documentation of the Shared Decision Making visit,  smoking cessation counseling if appropriate, and verification or eligibility for lung cancer screening as documented by the RN Nurse Navigator.   Raejean Bullock, MSN, AGACNP-BC [facility] See Amion for personal pager PCCM on call pager [phone]     Virtual Visit via Video Note  I connected with Caroline Conley on 12/23/23 at  3:30 PM EST by a video enabled telemedicine application and verified that I am speaking with the correct person using two identifiers.  Location: Patient: in home Provider: [address] [place], [place], [address]     Shared Decision Making Visit Lung Cancer Screening Program [phone])   Eligibility: Age 58 y.o. Pack Years Smoking History Calculation 18 (# packs/per year x # years smoked) Recent History of coughing up blood  no Unexplained weight loss? no ( >Than 15 pounds within the last 6 months ) Prior History Lung / other cancer no (Diagnosis within the last 5 years already requiring surveillance chest CT Scans). Smoking Status Former Smoker Former Smokers: Years since quit: 5 years  Quit Date: 03-02-18  Visit Components: Discussion included one or more decision making aids. yes Discussion included risk/benefits of screening. yes Discussion included potential follow up diagnostic testing for abnormal scans. yes Discussion included meaning and risk of over diagnosis. yes Discussion included meaning and risk of False Positives. yes Discussion included meaning of total radiation exposure. yes  Counseling Included: Importance of adherence to annual lung cancer LDCT screening. yes Impact of comorbidities on ability to participate in the program. yes Ability and willingness to under diagnostic treatment. yes  Smoking Cessation Counseling: Current Smokers:  Discussed importance of smoking cessation. yes Information  about tobacco cessation classes and interventions provided to patient. yes Patient provided with "ticket" for LDCT Scan. yes Symptomatic Patient. no  Counseling NA Diagnosis Code: Tobacco Use Z72.0 Asymptomatic Patient yes  Counseling (Intermediate counseling: > three minutes counseling) [id number] Former Smokers:  Discussed the importance of maintaining cigarette abstinence. yes Diagnosis Code: Personal History of Nicotine  Dependence. W10.272 Information about tobacco cessation classes and interventions provided to patient. Yes Patient provided with "ticket" for LDCT Scan. yes Written Order for Lung Cancer Screening with LDCT placed in Epic. Yes (CT Chest Lung Cancer Screening Low Dose W/O CM) [id number] Z12.2-Screening of respiratory organs Z87.891-Personal history of nicotine  dependence   Valentin Gaskins, RN 12/23/23

## 2023-12-23 NOTE — Patient Instructions (Signed)

## 2023-12-31 ENCOUNTER — Ambulatory Visit
Admission: RE | Admit: 2023-12-31 | Discharge: 2023-12-31 | Disposition: A | Discharge status: Home / Self Care | Payer: Medicare Other | Source: Ambulatory Visit | Attending: Acute Care | Admitting: Acute Care

## 2023-12-31 DIAGNOSIS — Z87891 Personal history of nicotine dependence: Secondary | ICD-10-CM | POA: Insufficient documentation

## 2023-12-31 DIAGNOSIS — Z122 Encounter for screening for malignant neoplasm of respiratory organs: Secondary | ICD-10-CM | POA: Diagnosis present

## 2024-01-08 ENCOUNTER — Telehealth: Payer: Self-pay | Admitting: Acute Care

## 2024-01-08 ENCOUNTER — Other Ambulatory Visit: Payer: Self-pay

## 2024-01-08 DIAGNOSIS — Z122 Encounter for screening for malignant neoplasm of respiratory organs: Secondary | ICD-10-CM

## 2024-01-08 DIAGNOSIS — R911 Solitary pulmonary nodule: Secondary | ICD-10-CM

## 2024-01-08 DIAGNOSIS — Z87891 Personal history of nicotine dependence: Secondary | ICD-10-CM

## 2024-01-08 NOTE — Telephone Encounter (Signed)
3 Month order placed. Results and plan faxed to PCP.

## 2024-01-08 NOTE — Telephone Encounter (Signed)
I have called the patient with the results of her low-dose screening CT.  I explained that her scan was read as a lung RADS 0 which is an incomplete rating.  The reason the scan was read as an incomplete was because of a new right lower lobe pulmonary nodule that appeared to be an area of mucoid impaction within the bronchus, and is favored to be infectious or inflammatory in etiology. When I spoke to the patient today she told me she has been sick.  She has just completed a round of antibiotics. She is in agreement with the 44-month follow-up which will be due around mid April 2025. Jonita Albee, and [place] please fax results to PCP and let them know plan is for a 22-month follow-up to reevaluate the area after the patient was treated with antibiotics. Please place order for 36-month follow-up low-dose screening CT due April 2025 We will review results at that time and determine next best steps. Thank you so much

## 2024-01-22 DIAGNOSIS — J381 Polyp of vocal cord and larynx: Secondary | ICD-10-CM | POA: Diagnosis not present

## 2024-01-22 DIAGNOSIS — K219 Gastro-esophageal reflux disease without esophagitis: Secondary | ICD-10-CM | POA: Diagnosis not present

## 2024-01-22 DIAGNOSIS — H6521 Chronic serous otitis media, right ear: Secondary | ICD-10-CM | POA: Diagnosis not present

## 2024-02-18 DIAGNOSIS — M25511 Pain in right shoulder: Secondary | ICD-10-CM | POA: Diagnosis not present

## 2024-02-25 ENCOUNTER — Encounter: Payer: Self-pay | Admitting: Family Medicine

## 2024-02-25 ENCOUNTER — Ambulatory Visit (INDEPENDENT_AMBULATORY_CARE_PROVIDER_SITE_OTHER): Admitting: Family Medicine

## 2024-02-25 VITALS — BP 136/78 | HR 97 | Ht 66.0 in | Wt 125.0 lb

## 2024-02-25 DIAGNOSIS — R490 Dysphonia: Secondary | ICD-10-CM | POA: Diagnosis not present

## 2024-02-25 DIAGNOSIS — J441 Chronic obstructive pulmonary disease with (acute) exacerbation: Secondary | ICD-10-CM

## 2024-02-25 DIAGNOSIS — J309 Allergic rhinitis, unspecified: Secondary | ICD-10-CM

## 2024-02-25 DIAGNOSIS — J381 Polyp of vocal cord and larynx: Secondary | ICD-10-CM | POA: Diagnosis not present

## 2024-02-25 DIAGNOSIS — H903 Sensorineural hearing loss, bilateral: Secondary | ICD-10-CM | POA: Diagnosis not present

## 2024-02-25 DIAGNOSIS — B379 Candidiasis, unspecified: Secondary | ICD-10-CM

## 2024-02-25 DIAGNOSIS — J449 Chronic obstructive pulmonary disease, unspecified: Secondary | ICD-10-CM

## 2024-02-25 MED ORDER — FLUTICASONE PROPIONATE 50 MCG/ACT NA SUSP: 2.0000 | Freq: Every day | NASAL | 3 refills | Status: AC

## 2024-02-25 MED ORDER — ALBUTEROL SULFATE HFA 108 (90 BASE) MCG/ACT IN AERS: 2.0000 | INHALATION_SPRAY | RESPIRATORY_TRACT | 5 refills | Status: DC | PRN

## 2024-02-25 MED ORDER — FLUCONAZOLE 150 MG PO TABS: 150.0000 mg | ORAL_TABLET | ORAL | 3 refills | Status: DC

## 2024-02-25 MED ORDER — LEVOFLOXACIN 500 MG PO TABS: 500.0000 mg | ORAL_TABLET | Freq: Every day | ORAL | 0 refills | Status: DC

## 2024-02-25 MED ORDER — HYDROCOD POLI-CHLORPHE POLI ER 10-8 MG/5ML PO SUER: 5.0000 mL | Freq: Two times a day (BID) | ORAL | 0 refills | Status: DC | PRN

## 2024-02-25 MED ORDER — PREDNISONE 20 MG PO TABS: ORAL_TABLET | ORAL | 0 refills | Status: DC

## 2024-02-25 NOTE — Progress Notes (Signed)
 Subjective:    Patient ID: Caroline Conley, female    DOB: 07-Sep-1966, 58 y.o.   MRN: [id number]  Caroline Conley is a 58 y.o. female presenting on 02/25/2024 for COPD   HPI  Discussed the use of AI scribe software for clinical note transcription with the patient, who gave verbal consent to proceed.  History of Present Illness   The patient presents with exacerbation of respiratory symptoms, including thick yellow mucus and wheezing.  She has been experiencing exacerbation of respiratory symptoms for about a week, characterized by thick yellow mucus and wheezing. She attributes this flare-up to a possible change in weather and pollen. She has been using her nebulizer several times a week and her inhaler several times a day to manage her symptoms.  Her current medications include a ProAir inhaler, which she prefers over Ventolin due to itching caused by the latter, and Flonase nasal spray, especially with the season change. Her medication history includes Trelegy, which was recently increased from 100 to 200 micrograms. She has previously been treated for last acute COPD flare 11/2023 with doxycycline, prednisone, and cough syrup for respiratory issues. She inquires about her Diflucan prescription, which was last ordered in October with a couple of refills due to trelegy use.  She experiences a recurring issue with a finger that splits open twice a year, which she attributes to frequent hand washing from her past work. She usually keeps it covered and uses Vaseline to manage the condition. 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      Upcoming repeat CT Chest for follow-up incomplete findings.     02/25/2024    8:30 AM 10/13/2023    2:57 PM 08/26/2023    1:58 PM  Depression screen PHQ 2/9  Decreased Interest 0 0 0  Down, Depressed, Hopeless 0 0 0  PHQ - 2 Score 0 0 0  Altered sleeping 0 0 0  Tired, decreased energy 0 0 0  Change in appetite 0 0 0   Feeling bad or failure about yourself  0 0 0  Trouble concentrating 0 0 0  Moving slowly or fidgety/restless 0 0 0  Suicidal thoughts 0 0 0  PHQ-9 Score 0 0 0  Difficult doing work/chores Not difficult at all  Not difficult at all       02/25/2024    8:31 AM 10/13/2023    2:58 PM 08/26/2023    1:58 PM 08/08/2023    9:11 AM  GAD 7 : Generalized Anxiety Score  Nervous, Anxious, on Edge 0 0 0 0  Control/stop worrying 0 0 0 0  Worry too much - different things 0 0 0 0  Trouble relaxing 0 0 0 0  Restless 0 0 0 0  Easily annoyed or irritable 0 0 0 0  Afraid - awful might happen 0 0 0 0  Total GAD 7 Score 0 0 0 0  Anxiety Difficulty Not difficult at all   Not difficult at all    Social History   Tobacco Use   Smoking status: Former    Current packs/day: 0.00    Average packs/day: 1 pack/day for 30.0 years (30.0 ttl pk-yrs)    Types: Cigarettes    Start date: 03/02/1988    Quit date: 03/02/2018    Years since quitting: 5.9   Smokeless tobacco: Never   Tobacco comments:    0.5 to 1 ppd >30 years  Vaping Use   Vaping  status: Never Used  Substance Use Topics   Alcohol use: No   Drug use: Yes    Types: Oxycodone    Review of Systems Per HPI unless specifically indicated above     Objective:    BP (!) 140/78   Pulse 97   Ht 5\' 6"  (1.676 m)   Wt 125 lb (56.7 kg)   SpO2 98%   BMI 20.18 kg/m   Wt Readings from Last 3 Encounters:  02/25/24 125 lb (56.7 kg)  12/12/23 128 lb (58.1 kg)  10/17/23 130 lb 3.2 oz (59.1 kg)    Physical Exam Vitals and nursing note reviewed.  Constitutional:      General: She is not in acute distress.    Appearance: She is well-developed. She is not diaphoretic.     Comments: Well-appearing, comfortable, cooperative  HENT:     Head: Normocephalic and atraumatic.  Eyes:     General:        Right eye: No discharge.        Left eye: No discharge.     Conjunctiva/sclera: Conjunctivae normal.  Neck:     Thyroid: No thyromegaly.   Cardiovascular:     Rate and Rhythm: Normal rate and regular rhythm.     Heart sounds: Normal heart sounds. No murmur heard. Pulmonary:     Effort: Pulmonary effort is normal. No respiratory distress.     Breath sounds: Wheezing present. No rales.  Musculoskeletal:        General: Normal range of motion.     Cervical back: Normal range of motion and neck supple.  Lymphadenopathy:     Cervical: No cervical adenopathy.  Skin:    General: Skin is warm and dry.     Findings: No erythema or rash.  Neurological:     Mental Status: She is alert and oriented to person, place, and time.  Psychiatric:        Behavior: Behavior normal.     Comments: Well groomed, good eye contact, normal speech and thoughts     Results for orders placed or performed during the hospital encounter of 10/28/22  Lipase, blood   Collection Time: 10/28/22  6:44 PM  Result Value Ref Range   Lipase 31 11 - 51 U/L  Comprehensive metabolic panel   Collection Time: 10/28/22  6:44 PM  Result Value Ref Range   Sodium 138 135 - 145 mmol/L   Potassium 3.6 3.5 - 5.1 mmol/L   Chloride 104 98 - 111 mmol/L   CO2 24 22 - 32 mmol/L   Glucose, Bld 129 (H) 70 - 99 mg/dL   BUN 8 6 - 20 mg/dL   Creatinine, Ser 1.61 0.44 - 1.00 mg/dL   Calcium 9.5 8.9 - 09.6 mg/dL   Total Protein 7.2 6.5 - 8.1 g/dL   Albumin 3.5 3.5 - 5.0 g/dL   AST 77 (H) 15 - 41 U/L   ALT 30 0 - 44 U/L   Alkaline Phosphatase 150 (H) 38 - 126 U/L   Total Bilirubin 0.8 0.3 - 1.2 mg/dL   GFR, Estimated >04 >54 mL/min   Anion gap 10 5 - 15  CBC   Collection Time: 10/28/22  6:44 PM  Result Value Ref Range   WBC 11.1 (H) 4.0 - 10.5 K/uL   RBC 4.36 3.87 - 5.11 MIL/uL   Hemoglobin 15.0 12.0 - 15.0 g/dL   HCT 09.8 11.9 - 14.7 %   MCV 96.8 80.0 - 100.0 fL   MCH  34.4 (H) 26.0 - 34.0 pg   MCHC 35.5 30.0 - 36.0 g/dL   RDW 16.1 09.6 - 04.5 %   Platelets 277 150 - 400 K/uL   nRBC 0.0 0.0 - 0.2 %  Phosphorus   Collection Time: 10/28/22  6:44 PM  Result  Value Ref Range   Phosphorus 2.7 2.5 - 4.6 mg/dL  Magnesium   Collection Time: 10/28/22  6:44 PM  Result Value Ref Range   Magnesium 1.8 1.7 - 2.4 mg/dL  Procalcitonin - Baseline   Collection Time: 10/28/22  6:44 PM  Result Value Ref Range   Procalcitonin <0.10 ng/mL  Resp Panel by RT-PCR (Flu A&B, Covid) Urine, Clean Catch   Collection Time: 10/28/22  8:03 PM   Specimen: Urine, Clean Catch; Nasal Swab  Result Value Ref Range   SARS Coronavirus 2 by RT PCR POSITIVE (A) NEGATIVE   Influenza A by PCR NEGATIVE NEGATIVE   Influenza B by PCR NEGATIVE NEGATIVE  Urinalysis, Routine w reflex microscopic Urine, Clean Catch   Collection Time: 10/28/22  8:03 PM  Result Value Ref Range   Color, Urine STRAW (A) YELLOW   APPearance CLEAR (A) CLEAR   Specific Gravity, Urine 1.008 1.005 - 1.030   pH 6.0 5.0 - 8.0   Glucose, UA NEGATIVE NEGATIVE mg/dL   Hgb urine dipstick MODERATE (A) NEGATIVE   Bilirubin Urine NEGATIVE NEGATIVE   Ketones, ur NEGATIVE NEGATIVE mg/dL   Protein, ur NEGATIVE NEGATIVE mg/dL   Nitrite NEGATIVE NEGATIVE   Leukocytes,Ua NEGATIVE NEGATIVE   RBC / HPF 0-5 0 - 5 RBC/hpf   WBC, UA 6-10 0 - 5 WBC/hpf   Bacteria, UA NONE SEEN NONE SEEN   Squamous Epithelial / HPF 0-5 0 - 5   Mucus PRESENT    I have personally reviewed the radiology report from 12/31/23 on CT.  CLINICAL DATA:  58 year old female former smoker (quit in 2019) with 86 pack-year history of smoking. Lung cancer screening examination.   EXAM: CT CHEST WITHOUT CONTRAST LOW-DOSE FOR LUNG CANCER SCREENING   TECHNIQUE: Multidetector CT imaging of the chest was performed following the standard protocol without IV contrast.   RADIATION DOSE REDUCTION: This exam was performed according to the departmental dose-optimization program which includes automated exposure control, adjustment of the mA and/or kV according to patient size and/or use of iterative reconstruction technique.   COMPARISON:  Chest CTA  10/28/2022.   FINDINGS: Cardiovascular: Heart size is normal. There is no significant pericardial fluid, thickening or pericardial calcification. Aortic atherosclerosis. No definite coronary artery calcifications. Aberrant right subclavian artery (normal anatomical variant) incidentally noted.   Mediastinum/Nodes: No pathologically enlarged mediastinal or hilar lymph nodes. Please note that accurate exclusion of hilar adenopathy is limited on noncontrast CT scans. Esophagus is unremarkable in appearance. No axillary lymphadenopathy.   Lungs/Pleura: Multiple pulmonary nodules are again noted, largest of which is a new nodule at a branch point in subsegmental sized bronchi in the right lower lobe (axial image 208) with a volume derived mean diameter of 7.7 mm, likely mucoid impaction and presumably infectious or inflammatory in etiology. No other larger more suspicious appearing pulmonary nodules or masses are noted. No acute consolidative airspace disease. No pleural effusions. Mild diffuse bronchial wall thickening with moderate centrilobular and paraseptal emphysema.   Upper Abdomen: Aortic atherosclerosis.  Status post cholecystectomy.   Musculoskeletal: There are no aggressive appearing lytic or blastic lesions noted in the visualized portions of the skeleton.   IMPRESSION: 1. New right lower lobe pulmonary  nodule, apparently an area of mucoid impaction within a bronchus favored to be of infectious or inflammatory etiology limiting today's study resulting categorization as Lung-RADS 0, incomplete. Short-term follow-up in 3 months is recommended with repeat low-dose chest CT without contrast (please use the following order, "CT CHEST LCS NODULE FOLLOW-UP W/O CM"). 2. Aortic atherosclerosis. 3. Mild diffuse bronchial wall thickening with moderate centrilobular and paraseptal emphysema; imaging findings suggestive of underlying COPD.   Aortic Atherosclerosis (ICD10-I70.0)  and Emphysema (ICD10-J43.9).     Electronically Signed   By: Trudie Reed M.D.   On: 01/04/2024 11:20    Assessment & Plan:   Problem List Items Addressed This Visit     Chronic obstructive pulmonary disease (HCC)   Relevant Medications   albuterol (PROAIR HFA) 108 (90 Base) MCG/ACT inhaler   levofloxacin (LEVAQUIN) 500 MG tablet   predniSONE (DELTASONE) 20 MG tablet   fluticasone (FLONASE) 50 MCG/ACT nasal spray   chlorpheniramine-HYDROcodone (TUSSIONEX) 10-8 MG/5ML   Other Visit Diagnoses       COPD with acute exacerbation (HCC)    -  Primary   Relevant Medications   albuterol (PROAIR HFA) 108 (90 Base) MCG/ACT inhaler   levofloxacin (LEVAQUIN) 500 MG tablet   predniSONE (DELTASONE) 20 MG tablet   fluticasone (FLONASE) 50 MCG/ACT nasal spray   chlorpheniramine-HYDROcodone (TUSSIONEX) 10-8 MG/5ML     Antibiotic-induced yeast infection       Relevant Medications   fluconazole (DIFLUCAN) 150 MG tablet     Allergic sinusitis       Relevant Medications   fluconazole (DIFLUCAN) 150 MG tablet   levofloxacin (LEVAQUIN) 500 MG tablet   predniSONE (DELTASONE) 20 MG tablet   fluticasone (FLONASE) 50 MCG/ACT nasal spray   chlorpheniramine-HYDROcodone (TUSSIONEX) 10-8 MG/5ML         COPD exacerbation Duration >1 week. Increased sputum production, wheezing. No fever. Last exacerbation 11/2023 that was treated with Doxycycline.   Start taking Levaquin antibiotic 500mg  daily x 7 days -Start Prednisone taper for AECOPD -Tussionex cough syrup -Continue current COPD medications including increased dose of Trelegy (200mg ). -Re order ProAir, use nebulizers - Diflucan refilled Follow up with Pulm if not improving  Chronic finger skin fissure Chronic fissure likely from skin breaking, exacerbated by frequent hand washing. - Apply Vaseline to promote healing.         No orders of the defined types were placed in this encounter.   Meds ordered this encounter   Medications   albuterol (PROAIR HFA) 108 (90 Base) MCG/ACT inhaler    Sig: Inhale 2 puffs into the lungs every 4 (four) hours as needed for wheezing or shortness of breath.    Dispense:  8 g    Refill:  5   fluconazole (DIFLUCAN) 150 MG tablet    Sig: Take 1 tablet (150 mg total) by mouth every other day.    Dispense:  6 tablet    Refill:  3   levofloxacin (LEVAQUIN) 500 MG tablet    Sig: Take 1 tablet (500 mg total) by mouth daily. For 7 days    Dispense:  7 tablet    Refill:  0   predniSONE (DELTASONE) 20 MG tablet    Sig: Take 2 tablets daily (40mg ) for 4 days, take 1 tab daily (20mg ) for 4 days, take half tab daily (10mg ) for 4 days    Dispense:  14 tablet    Refill:  0   fluticasone (FLONASE) 50 MCG/ACT nasal spray    Sig: Place 2  sprays into both nostrils daily. Use for 4-6 weeks then stop and use seasonally or as needed.    Dispense:  16 g    Refill:  3   chlorpheniramine-HYDROcodone (TUSSIONEX) 10-8 MG/5ML    Sig: Take 5 mLs by mouth every 12 (twelve) hours as needed for cough.    Dispense:  140 mL    Refill:  0    Follow up plan: Return if symptoms worsen or fail to improve.   Saralyn Pilar, DO [facility] 02/25/2024, 8:39 AM

## 2024-02-25 NOTE — Patient Instructions (Addendum)
 COPD Exacerbation Start taking Levaquin antibiotic 500mg  daily x 7 days Refilled Albuterol ProAir Keep using nebulizer and Trelegy Diflucan refilled Tussionex Start nasal steroid Flonase 2 sprays in each nostril daily for 4-6 weeks, may repeat course seasonally or as needed   Please schedule a Follow-up Appointment to: Return if symptoms worsen or fail to improve.  If you have any other questions or concerns, please feel free to call the office or send a message through MyChart. You may also schedule an earlier appointment if necessary.  Additionally, you may be receiving a survey about your experience at our office within a few days to 1 week by e-mail or mail. We value your feedback.  Saralyn Pilar, DO [facility], [place]

## 2024-02-26 ENCOUNTER — Other Ambulatory Visit: Payer: Self-pay | Admitting: Otolaryngology

## 2024-02-26 ENCOUNTER — Other Ambulatory Visit: Payer: Self-pay | Admitting: Family Medicine

## 2024-02-26 DIAGNOSIS — I10 Essential (primary) hypertension: Secondary | ICD-10-CM

## 2024-02-26 NOTE — Telephone Encounter (Signed)
 Requested medications are due for refill today.  yes  Requested medications are on the active medications list.  yes  Last refill. 02/03/2023 #90 3 rf  Future visit scheduled.   no  Notes to clinic.  Rx written to expire 02/27/2024.     Requested Prescriptions  Pending Prescriptions Disp Refills   metoprolol succinate (TOPROL-XL) 25 MG 24 hr tablet [Pharmacy Med Name: METOPROLOL SUCC ER 25 MG TAB] 45 tablet 3    Sig: TAKE 1/2 TABLET BY MOUTH DAILY     Cardiovascular:  Beta Blockers Passed - 02/26/2024  1:36 PM      Passed - Last BP in normal range    BP Readings from Last 1 Encounters:  02/25/24 136/78         Passed - Last Heart Rate in normal range    Pulse Readings from Last 1 Encounters:  02/25/24 97         Passed - Valid encounter within last 6 months    Recent Outpatient Visits           2 months ago COPD with acute exacerbation [facility])   [place] [facility] [place], Netta Neat, DO   4 months ago COPD with acute exacerbation [facility])   [place] [facility] Smitty Cords, [place]   6 months ago COPD with acute exacerbation [facility])   [place] [facility] Smitty Cords, DO   6 months ago Pain in joint of right shoulder   [facility] Smitty Cords, DO   8 months ago COPD with acute exacerbation [facility])   [place] [facility] [place], Netta Neat, [place]

## 2024-03-01 ENCOUNTER — Other Ambulatory Visit: Payer: Self-pay

## 2024-03-01 ENCOUNTER — Telehealth: Payer: Self-pay

## 2024-03-01 DIAGNOSIS — J441 Chronic obstructive pulmonary disease with (acute) exacerbation: Secondary | ICD-10-CM

## 2024-03-01 MED ORDER — ROFLUMILAST 500 MCG PO TABS: 500.0000 ug | ORAL_TABLET | Freq: Every day | ORAL | 11 refills | Status: AC

## 2024-03-01 NOTE — Progress Notes (Signed)
 Received a refill request from [facility] for Roflumilast 500 MCG.  Refill has been sent in.  Nothing further needed.

## 2024-03-01 NOTE — Telephone Encounter (Signed)
*  Pulm  Pharmacy Patient Advocate Encounter   Received notification from CoverMyMeds that prior authorization for Roflumilast tablets  is required/requested.   Insurance verification completed.   The patient is insured through [facility] .   Per test claim: PA required; PA submitted to above mentioned insurance via CoverMyMeds Key/confirmation #/EOC [facility] Status is pending

## 2024-03-02 ENCOUNTER — Other Ambulatory Visit (HOSPITAL_COMMUNITY): Payer: Self-pay

## 2024-03-02 ENCOUNTER — Encounter: Payer: Self-pay | Admitting: Otolaryngology

## 2024-03-02 NOTE — Telephone Encounter (Signed)
 Pharmacy Patient Advocate Encounter  Received notification from [facility] that Prior Authorization for Roflumilast tablets  has been APPROVED from 03/01/2024 to 12/15/2024. Unable to obtain price due to refill too soon rejection, last fill date 03/01/2024 next available fill date04/08/2024

## 2024-03-03 NOTE — Anesthesia Preprocedure Evaluation (Addendum)
 Anesthesia Evaluation  Patient identified by MRN, date of birth, ID band Patient awake    Reviewed: Allergy & Precautions, H&P , NPO status , Patient's Chart, lab work & pertinent test results  Airway Mallampati: II  TM Distance: >3 FB Neck ROM: Full    Dental no notable dental hx. (+) Upper Dentures, Lower Dentures   Pulmonary COPD, Patient abstained from smoking., former smoker   Pulmonary exam normal breath sounds clear to auscultation       Cardiovascular hypertension, + CAD  negative cardio ROS Normal cardiovascular exam+ dysrhythmias  Rhythm:Regular Rate:Normal     Neuro/Psych  Neuromuscular disease negative neurological ROS  negative psych ROS   GI/Hepatic negative GI ROS, Neg liver ROS,GERD  ,,  Endo/Other  negative endocrine ROS    Renal/GU negative Renal ROS  negative genitourinary   Musculoskeletal negative musculoskeletal ROS (+) Arthritis ,    Abdominal   Peds negative pediatric ROS (+)  Hematology negative hematology ROS (+)   Anesthesia Other Findings COPD exacerbation Duration >1 week. Increased sputum production, wheezing. No fever. Last exacerbation 11/2023 that was treated with Doxycycline.   Start taking Levaquin antibiotic 500mg  daily x 7 days -Start Prednisone taper for AECOPD -Tussionex cough syrup -Continue current COPD medications including increased dose of Trelegy (200mg ). -Re order ProAir, use nebulizers - Diflucan refilled Follow up with Pulm if not improving 02-25-24   Reproductive/Obstetrics negative OB ROS                             Anesthesia Physical Anesthesia Plan  ASA: 3  Anesthesia Plan: MAC   Post-op Pain Management:    Induction: Intravenous  PONV Risk Score and Plan:   Airway Management Planned: Natural Airway and Nasal Cannula  Additional Equipment:   Intra-op Plan:   Post-operative Plan:   Informed Consent: I have reviewed  the patients History and Physical, chart, labs and discussed the procedure including the risks, benefits and alternatives for the proposed anesthesia with the patient or authorized representative who has indicated his/her understanding and acceptance.     Dental Advisory Given  Plan Discussed with: Anesthesiologist, CRNA and Surgeon  Anesthesia Plan Comments: (Patient consented for risks of anesthesia including but not limited to:  - adverse reactions to medications - damage to eyes, teeth, lips or other oral mucosa - nerve damage due to positioning  - sore throat or hoarseness - Damage to heart, brain, nerves, lungs, other parts of body or loss of life  Patient voiced understanding and assent.)       Anesthesia Quick Evaluation

## 2024-03-12 ENCOUNTER — Other Ambulatory Visit: Payer: Self-pay | Admitting: Family Medicine

## 2024-03-12 ENCOUNTER — Telehealth: Payer: Self-pay | Admitting: Family Medicine

## 2024-03-12 DIAGNOSIS — Z2911 Encounter for prophylactic immunotherapy for respiratory syncytial virus (RSV): Secondary | ICD-10-CM

## 2024-03-12 MED ORDER — RSVPREF3 VAC RECOMB ADJUVANTED 120 MCG/0.5ML IM SUSR: 0.5000 mL | Freq: Once | INTRAMUSCULAR | 0 refills | Status: AC

## 2024-03-12 NOTE — Telephone Encounter (Signed)
 Copied from CRM [id number]. Topic: Clinical - Prescription Issue >> Mar 12, 2024  4:07 PM [place] B wrote: Reason for CRM: RSV vaccine recomb adjuvanted (AREXVY) 120 MCG/0.5ML injection needs prior authorization

## 2024-03-15 ENCOUNTER — Telehealth: Payer: Self-pay

## 2024-03-15 NOTE — Telephone Encounter (Signed)
 Started this PA see notes

## 2024-03-15 NOTE — Telephone Encounter (Addendum)
 Leonie Man (Key: [id number]) Rx #: [phone] Need Help? Call us at [phone] Status New (Not sent to plan) Drug Abrysvo 120MCG/0.5ML solution ePA cloud logo Form [employer] Medicare Part D Electronic Prior Authorization Form (2017 NCPDP) Original Claim Info [phone] Provide Exception Process Printed NoticeTransition N/A,Dr Submit ePA [email or web address]    Approved today by [facility] Medicare 2017 NCPDP Request Reference Number: [id number]. ABRYSVO INJ is approved through 09/14/2024. Your patient may now fill this prescription and it will be covered. Effective Date: 03/15/2024 Authorization Expiration Date: 09/14/2024

## 2024-03-17 ENCOUNTER — Ambulatory Visit: Payer: Self-pay | Admitting: Anesthesiology

## 2024-03-17 ENCOUNTER — Encounter
Admission: RE | Disposition: A | Discharge status: Home / Self Care | Payer: Self-pay | Source: Home / Self Care | Attending: Otolaryngology

## 2024-03-17 ENCOUNTER — Other Ambulatory Visit: Payer: Self-pay

## 2024-03-17 ENCOUNTER — Ambulatory Visit
Admission: RE | Admit: 2024-03-17 | Discharge: 2024-03-17 | Disposition: A | Discharge status: Home / Self Care | Attending: Otolaryngology | Admitting: Otolaryngology

## 2024-03-17 ENCOUNTER — Encounter: Payer: Self-pay | Admitting: Otolaryngology

## 2024-03-17 DIAGNOSIS — R490 Dysphonia: Secondary | ICD-10-CM | POA: Diagnosis not present

## 2024-03-17 DIAGNOSIS — J381 Polyp of vocal cord and larynx: Secondary | ICD-10-CM | POA: Diagnosis not present

## 2024-03-17 DIAGNOSIS — Z87891 Personal history of nicotine dependence: Secondary | ICD-10-CM | POA: Diagnosis not present

## 2024-03-17 DIAGNOSIS — I2510 Atherosclerotic heart disease of native coronary artery without angina pectoris: Secondary | ICD-10-CM | POA: Insufficient documentation

## 2024-03-17 DIAGNOSIS — I110 Hypertensive heart disease with heart failure: Secondary | ICD-10-CM | POA: Diagnosis not present

## 2024-03-17 DIAGNOSIS — J449 Chronic obstructive pulmonary disease, unspecified: Secondary | ICD-10-CM | POA: Diagnosis not present

## 2024-03-17 DIAGNOSIS — I10 Essential (primary) hypertension: Secondary | ICD-10-CM | POA: Insufficient documentation

## 2024-03-17 DIAGNOSIS — I5022 Chronic systolic (congestive) heart failure: Secondary | ICD-10-CM | POA: Diagnosis not present

## 2024-03-17 DIAGNOSIS — D141 Benign neoplasm of larynx: Secondary | ICD-10-CM | POA: Diagnosis not present

## 2024-03-17 HISTORY — PX: MICROLARYNGOSCOPY WITH LASER: SHX5972

## 2024-03-17 SURGERY — MICROLARYNGOSCOPY, WITH PROCEDURE USING LASER
Anesthesia: Monitor Anesthesia Care | Laterality: Bilateral

## 2024-03-17 MED ORDER — LIDOCAINE HCL (CARDIAC) PF 100 MG/5ML IV SOSY
PREFILLED_SYRINGE | INTRAVENOUS | Status: DC | PRN
  Administered 2024-03-17: 60 mg via INTRAVENOUS

## 2024-03-17 MED ORDER — DEXAMETHASONE SODIUM PHOSPHATE 4 MG/ML IJ SOLN
INTRAMUSCULAR | Status: AC
  Filled 2024-03-17: qty 1

## 2024-03-17 MED ORDER — MIDAZOLAM HCL 5 MG/5ML IJ SOLN
INTRAMUSCULAR | Status: DC | PRN
  Administered 2024-03-17: 2 mg via INTRAVENOUS

## 2024-03-17 MED ORDER — DEXAMETHASONE SODIUM PHOSPHATE 4 MG/ML IJ SOLN
INTRAMUSCULAR | Status: DC | PRN
  Administered 2024-03-17: 4 mg via INTRAVENOUS

## 2024-03-17 MED ORDER — ONDANSETRON HCL 4 MG/2ML IJ SOLN
INTRAMUSCULAR | Status: AC
  Filled 2024-03-17: qty 2

## 2024-03-17 MED ORDER — FENTANYL CITRATE (PF) 100 MCG/2ML IJ SOLN
INTRAMUSCULAR | Status: DC | PRN
  Administered 2024-03-17 (×2): 50 ug via INTRAVENOUS

## 2024-03-17 MED ORDER — PHENYLEPHRINE HCL (PRESSORS) 10 MG/ML IV SOLN
INTRAVENOUS | Status: DC | PRN
  Administered 2024-03-17 (×7): 80 ug via INTRAVENOUS

## 2024-03-17 MED ORDER — LIDOCAINE HCL 4 % MT SOLN
OROMUCOSAL | Status: AC
  Filled 2024-03-17: qty 4

## 2024-03-17 MED ORDER — OXYCODONE-ACETAMINOPHEN 10-325 MG PO TABS: 1.0000 | ORAL_TABLET | Freq: Four times a day (QID) | ORAL | 0 refills | Status: AC | PRN

## 2024-03-17 MED ORDER — GLYCOPYRROLATE 0.2 MG/ML IJ SOLN
INTRAMUSCULAR | Status: AC
  Filled 2024-03-17: qty 1

## 2024-03-17 MED ORDER — SODIUM CHLORIDE 0.9 % IV SOLN: INTRAVENOUS | Status: DC | PRN

## 2024-03-17 MED ORDER — EPINEPHRINE PF 1 MG/ML IJ SOLN
INTRAMUSCULAR | Status: DC | PRN
  Administered 2024-03-17: 1 mg via SUBCUTANEOUS

## 2024-03-17 MED ORDER — ONDANSETRON HCL 4 MG PO TABS: 4.0000 mg | ORAL_TABLET | Freq: Three times a day (TID) | ORAL | 0 refills | Status: AC | PRN

## 2024-03-17 MED ORDER — GLYCOPYRROLATE 0.2 MG/ML IJ SOLN
INTRAMUSCULAR | Status: DC | PRN
  Administered 2024-03-17: .2 mg via INTRAVENOUS

## 2024-03-17 MED ORDER — MIDAZOLAM HCL 2 MG/2ML IJ SOLN
INTRAMUSCULAR | Status: AC
  Filled 2024-03-17: qty 2

## 2024-03-17 MED ORDER — ONDANSETRON HCL 4 MG/2ML IJ SOLN
INTRAMUSCULAR | Status: DC | PRN
  Administered 2024-03-17: 4 mg via INTRAVENOUS

## 2024-03-17 MED ORDER — FENTANYL CITRATE (PF) 100 MCG/2ML IJ SOLN
INTRAMUSCULAR | Status: AC
  Filled 2024-03-17: qty 2

## 2024-03-17 MED ORDER — PROPOFOL 10 MG/ML IV BOLUS
INTRAVENOUS | Status: DC | PRN
  Administered 2024-03-17: 130 mg via INTRAVENOUS
  Administered 2024-03-17: 70 mg via INTRAVENOUS

## 2024-03-17 MED ORDER — SUCCINYLCHOLINE CHLORIDE 200 MG/10ML IV SOSY
PREFILLED_SYRINGE | INTRAVENOUS | Status: DC | PRN
  Administered 2024-03-17: 120 mg via INTRAVENOUS

## 2024-03-17 SURGICAL SUPPLY — 16 items
ANTIFOG SOL W/FOAM PAD STRL (MISCELLANEOUS) ×1 IMPLANT
BLOCK BITE GUARD (MISCELLANEOUS) ×1 IMPLANT
COAGULATOR SUCTION 6 10FR HC (MISCELLANEOUS) ×1 IMPLANT
COVER MAYO STAND STRL (DRAPES) ×1 IMPLANT
COVER TABLE BACK 60X90 (DRAPES) ×1 IMPLANT
CUP MEDICINE 2OZ PLAST GRAD ST (MISCELLANEOUS) ×1 IMPLANT
DRSG TELFA 4X3 1S NADH ST (GAUZE/BANDAGES/DRESSINGS) ×1 IMPLANT
GLOVE SURG GAMMEX PI TX LF 7.5 (GLOVE) ×1 IMPLANT
KIT TURNOVER KIT A (KITS) ×1 IMPLANT
NDL 18GX1X1/2 (RX/OR ONLY) (NEEDLE) IMPLANT
NEEDLE 18GX1X1/2 (RX/OR ONLY) (NEEDLE) ×1 IMPLANT
PATTIES SURGICAL .5 X.5 (GAUZE/BANDAGES/DRESSINGS) ×1 IMPLANT
SOLUTION ANTFG W/FOAM PAD STRL (MISCELLANEOUS) ×1 IMPLANT
STRAP BODY AND KNEE 60X3 (MISCELLANEOUS) ×2 IMPLANT
TOWEL OR 17X26 4PK STRL BLUE (TOWEL DISPOSABLE) ×1 IMPLANT
TUBING SUCTION CONN 0.25 STRL (TUBING) ×1 IMPLANT

## 2024-03-17 NOTE — Anesthesia Postprocedure Evaluation (Signed)
 Anesthesia Post Note  Patient: Caroline Conley  Procedure(s) Performed: MICROLARYNGOSCOPY, WITH PROCEDURE (Bilateral)  Patient location during evaluation: PACU Anesthesia Type: MAC Level of consciousness: awake and alert Pain management: pain level controlled Vital Signs Assessment: post-procedure vital signs reviewed and stable Respiratory status: spontaneous breathing, nonlabored ventilation, respiratory function stable and patient connected to nasal cannula oxygen Cardiovascular status: blood pressure returned to baseline and stable Postop Assessment: no apparent nausea or vomiting Anesthetic complications: no   No notable events documented.   Last Vitals:  Vitals:   03/17/24 1045 03/17/24 1057  BP: 111/89 115/82  Pulse: (!) 102 89  Resp: 19 19  Temp:  (!) 36.3 C  SpO2: 97% 98%    Last Pain:  Vitals:   03/17/24 1057  TempSrc:   PainSc: 0-No pain                 Xzayvier Fagin C Soriah Leeman

## 2024-03-17 NOTE — H&P (Signed)
 ..  History and Physical paper copy reviewed and updated date of procedure and will be scanned into system.  Patient seen and examined.

## 2024-03-17 NOTE — Op Note (Signed)
....  03/17/2024  10:03 AM    Caroline Conley  [id number]   Pre-Op Dx:  Dysphonia, tobacco abuse, right vocal fold lesion  Post-op Dx: same  Proc: Suspension Microlaryngoscopy with microflap excision of right posterior vocal fold lesion  Surg:  [facility]  Anes:  GOT  EBL:  <15ml  Comp:  none  Findings:  polypoid mass on vibratory margin aspect of right posterior true vocal fold.  Scar tissue on left anterior vocal fold with no lesion identified on that side.  Successful removal of right vocal fold lesion.  Procedure: After the patient was identified in holding and the history and physical and consent was reviewed, the patient was taken to the operating room and placed in a supine position. General endotracheal anesthesia was induced in the normal fashion.  At this time, the patient was rotated 90 degrees and a shoulder roll was placed as well as well as a mouth guard.   At this time,Dedo laryngoscope was inserted into the patient's oral cavity. Visualization of the oral cavity, oropharynx, pharynx and larynx was made. This demonstrated an right posterior vocal fold mass emanating from dorsale and medial aspect of the true vocal fold.  Topical epinephrine on pledgets were placed against the lesion for approximately one minute. At this time, the Dedo laryngoscope was suspended from the [facility] stand and the operating microscope was brought onto the field.   Under high power magnification, a micro cup forceps was used to gently grasp the mass and retract it medially. A sickle knife was used to gently incise the mucosa of the vocal fold lateral to the mass.  A microdissector was used to dissect in the superficial layer of the lamina paprecia.  Care was taken to avoid trauma to the underlying musculature.   Next 45 degree biting micro scissors were used to gently divide the mucosa creating a straight vocal fold vibratory margin.  This removed all laryngeal masses.  The left vocal fold was  inspected and palpated under high power magnification.  No left sided lesion was noted and small sulcus was noted in anterior vocal fold consistent with scar tissue.    The patient's entire airway was next evaluated for hemostasis and meticulous suctioning was performed. The patient was released from suspension and the mouth gag and laryngoscope was removed from the patient's oral cavity without injury to teeth, lips, or gums.      Dispo:   To PACU in good condition  Plan:  Soft Diet, follow up in 1 week for repeat evaluation and review of pathology.  [name]  03/17/2024 10:03 AM

## 2024-03-17 NOTE — Anesthesia Procedure Notes (Signed)
 Procedure Name: Intubation Date/Time: 03/17/2024 9:39 AM  Performed by: Andee Poles, CRNAPre-anesthesia Checklist: Patient identified, Emergency Drugs available, Suction available, Patient being monitored and Timeout performed Patient Re-evaluated:Patient Re-evaluated prior to induction Oxygen Delivery Method: Circle system utilized Preoxygenation: Pre-oxygenation with 100% oxygen Induction Type: IV induction Ventilation: Mask ventilation without difficulty Laryngoscope Size: Mac and 3 Grade View: Grade I Tube type: MLT Tube size: 6.0 mm Number of attempts: 1 Airway Equipment and Method: LTA kit utilized Placement Confirmation: ETT inserted through vocal cords under direct vision, positive ETCO2 and breath sounds checked- equal and bilateral Tube secured with: Tape Dental Injury: Teeth and Oropharynx as per pre-operative assessment

## 2024-03-17 NOTE — Transfer of Care (Signed)
 Immediate Anesthesia Transfer of Care Note  Patient: Caroline Conley  Procedure(s) Performed: MICROLARYNGOSCOPY, WITH PROCEDURE (Bilateral)  Patient Location: PACU  Anesthesia Type: MAC  Level of Consciousness: awake, alert  and patient cooperative  Airway and Oxygen Therapy: Patient Spontanous Breathing and Patient connected to supplemental oxygen  Post-op Assessment: Post-op Vital signs reviewed, Patient's Cardiovascular Status Stable, Respiratory Function Stable, Patent Airway and No signs of Nausea or vomiting  Post-op Vital Signs: Reviewed and stable  Complications: No notable events documented.

## 2024-03-18 ENCOUNTER — Encounter: Payer: Self-pay | Admitting: Otolaryngology

## 2024-03-18 LAB — SURGICAL PATHOLOGY

## 2024-04-06 DIAGNOSIS — M25512 Pain in left shoulder: Secondary | ICD-10-CM | POA: Diagnosis not present

## 2024-04-06 DIAGNOSIS — M5451 Vertebrogenic low back pain: Secondary | ICD-10-CM | POA: Diagnosis not present

## 2024-04-06 DIAGNOSIS — M791 Myalgia, unspecified site: Secondary | ICD-10-CM | POA: Diagnosis not present

## 2024-04-06 DIAGNOSIS — Z5181 Encounter for therapeutic drug level monitoring: Secondary | ICD-10-CM | POA: Diagnosis not present

## 2024-04-06 DIAGNOSIS — M25612 Stiffness of left shoulder, not elsewhere classified: Secondary | ICD-10-CM | POA: Diagnosis not present

## 2024-04-06 DIAGNOSIS — Z79899 Other long term (current) drug therapy: Secondary | ICD-10-CM | POA: Diagnosis not present

## 2024-04-07 ENCOUNTER — Ambulatory Visit
Admission: RE | Admit: 2024-04-07 | Discharge: 2024-04-07 | Disposition: A | Discharge status: Home / Self Care | Source: Ambulatory Visit | Attending: Acute Care | Admitting: Acute Care

## 2024-04-07 DIAGNOSIS — Z122 Encounter for screening for malignant neoplasm of respiratory organs: Secondary | ICD-10-CM | POA: Insufficient documentation

## 2024-04-07 DIAGNOSIS — I700 Atherosclerosis of aorta: Secondary | ICD-10-CM | POA: Diagnosis not present

## 2024-04-07 DIAGNOSIS — Z87891 Personal history of nicotine dependence: Secondary | ICD-10-CM | POA: Insufficient documentation

## 2024-04-07 DIAGNOSIS — R918 Other nonspecific abnormal finding of lung field: Secondary | ICD-10-CM | POA: Diagnosis not present

## 2024-04-07 DIAGNOSIS — R911 Solitary pulmonary nodule: Secondary | ICD-10-CM | POA: Diagnosis not present

## 2024-04-07 DIAGNOSIS — J439 Emphysema, unspecified: Secondary | ICD-10-CM | POA: Diagnosis not present

## 2024-04-09 ENCOUNTER — Ambulatory Visit

## 2024-04-09 VITALS — BP 112/70 | Ht 66.0 in | Wt 125.0 lb

## 2024-04-09 DIAGNOSIS — Z1231 Encounter for screening mammogram for malignant neoplasm of breast: Secondary | ICD-10-CM | POA: Diagnosis not present

## 2024-04-09 DIAGNOSIS — Z0000 Encounter for general adult medical examination without abnormal findings: Secondary | ICD-10-CM | POA: Diagnosis not present

## 2024-04-09 NOTE — Patient Instructions (Addendum)
 Caroline Conley , Thank you for taking time to come for your Medicare Wellness Visit. I appreciate your ongoing commitment to your health goals. Please review the following plan we discussed and let me know if I can assist you in the future.   Referrals/Orders/Follow-Ups/Clinician Recommendations: MAMMOGRAM ORDERED  You have an order for:  []   2D Mammogram  [x]   3D Mammogram  []   Bone Density     Please call for appointment:  [facility]  [address] [place] [address] [phone] [facility] [address] [place], [place] [address] [phone] [facility] [address] Tracey Friday [place], [place] [address] [phone]   Make sure to wear two-piece clothing.  No lotions, powders, or deodorants the day of the appointment. Make sure to bring picture ID and insurance card.  Bring list of medications you are currently taking including any supplements.   Schedule your [place] screening mammogram through MyChart!   Log into your MyChart account.  Go to 'Visit' (or 'Appointments' if on mobile App) --> Schedule an Appointment  Under 'Select a Reason for Visit' choose the Mammogram Screening option.  Complete the pre-visit questions and select the time and place that best fits your schedule.   This is a list of the screening recommended for you and due dates:  Health Maintenance  Topic Date Due   Hepatitis C Screening  Never done   Zoster (Shingles) Vaccine (1 of 2) Never done   Pap with HPV screening  06/26/2021   COVID-19 Vaccine (6 - 2024-25 season) 08/17/2023   Mammogram  10/11/2023   Flu Shot  07/16/2024   Screening for Lung Cancer  12/30/2024   Medicare Annual Wellness Visit  04/09/2025   Colon Cancer Screening  07/08/2026   DTaP/Tdap/Td vaccine (3 - Td or Tdap) 03/10/2027   Pneumococcal Vaccination  Completed   HIV Screening  Completed   HPV  Vaccine  Aged Out   Meningitis B Vaccine  Aged Out    Advanced directives: (ACP Link)Information on Advanced Care Planning can be found at [place]  Secretary of State Advance Health Care Directives Advance Health Care Directives. [email or web address]   Next Medicare Annual Wellness Visit scheduled for next year: Yes  04/15/25 @ 10:50 AM IN PERSON

## 2024-04-09 NOTE — Progress Notes (Signed)
 Subjective:   Caroline Conley is a 58 y.o. who presents for a Medicare Wellness preventive visit.  Visit Complete: In person  Persons Participating in Visit: Patient.  AWV Questionnaire: No: Patient Medicare AWV questionnaire was not completed prior to this visit.  Cardiac Risk Factors include: advanced age (>3men, >26 women);hypertension;dyslipidemia;smoking/ tobacco exposure     Objective:    Today's Vitals   04/09/24 1132 04/09/24 1136  BP:  112/70  Weight:  125 lb (56.7 kg)  Height:  5\' 6"  (1.676 m)  PainSc: 4     Body mass index is 20.18 kg/m.     04/09/2024   11:40 AM 03/17/2024    8:17 AM 03/21/2023    3:42 PM 10/25/2022    8:48 AM 04/17/2022   10:13 AM 04/10/2022   12:14 PM 01/20/2022    7:37 PM  Advanced Directives  Does Patient Have a Medical Advance Directive? No No No No No No No  Would patient like information on creating a medical advance directive? No - Patient declined Yes (MAU/Ambulatory/Procedural Areas - Information given) Yes (MAU/Ambulatory/Procedural Areas - Information given) No - Patient declined No - Patient declined  No - Patient declined    Current Medications (verified) Outpatient Encounter Medications as of 04/09/2024  Medication Sig   albuterol  (PROAIR  HFA) 108 (90 Base) MCG/ACT inhaler Inhale 2 puffs into the lungs every 4 (four) hours as needed for wheezing or shortness of breath.   albuterol  (PROVENTIL ) (2.5 MG/3ML) 0.083% nebulizer solution USE 3 MILLILITERS (CONTENTS OF ONE VIAL) VIA NEBULIZATION BY MOUTH EVERY 4 HOURS AS NEEDED FOR SHORTNESS OF BREATH OR FOR WHEEZING   chlorpheniramine-HYDROcodone  (TUSSIONEX) 10-8 MG/5ML Take 5 mLs by mouth every 12 (twelve) hours as needed for cough.   fluticasone  (FLONASE ) 50 MCG/ACT nasal spray Place 2 sprays into both nostrils daily. Use for 4-6 weeks then stop and use seasonally or as needed.   Fluticasone -Umeclidin-Vilant (TRELEGY ELLIPTA ) 200-62.5-25 MCG/ACT AEPB Inhale 1 puff into the lungs daily.    magic mouthwash w/lidocaine  SOLN Swish, gargle, and spit one to two teaspoonfuls every six hours as needed. Shake well before using.   metoprolol  succinate (TOPROL -XL) 25 MG 24 hr tablet TAKE 1/2 TABLET BY MOUTH DAILY   Misc. Devices (PULSE OXIMETER FOR FINGER) MISC 1 Device by Does not apply route as needed (for cough, dyspnea). Pulse oximeter for finger for checking oxygen saturation in COPD   omeprazole  (PRILOSEC) 40 MG capsule TAKE 1 CAPSULE BY MOUTH DAILY BEFORE BREAKFAST   ondansetron  (ZOFRAN ) 4 MG tablet Take 1 tablet (4 mg total) by mouth every 8 (eight) hours as needed for nausea or vomiting.   oxyCODONE -acetaminophen  (PERCOCET) 10-325 MG tablet Take 1 tablet by mouth every 4 (four) hours.   promethazine  (PHENERGAN ) 25 MG tablet Take 0.5-1 tablets (12.5-25 mg total) by mouth every 8 (eight) hours as needed for nausea or vomiting.   roflumilast  (DALIRESP ) 500 MCG TABS tablet Take 1 tablet (500 mcg total) by mouth daily.   silver  sulfADIAZINE  (SILVADENE ) 1 % cream APPLY 1 APPLICATION TOPICALLY DAILY FOR 1-2 WEEKS THEN AS NEEDED   tiZANidine  (ZANAFLEX ) 4 MG tablet Take 1 tablet (4 mg total) by mouth every 8 (eight) hours as needed for muscle spasms.   No facility-administered encounter medications on file as of 04/09/2024.    Allergies (verified) Hydrocodone -acetaminophen , Vicodin [hydrocodone -acetaminophen ], and Morphine   History: Past Medical History:  Diagnosis Date   Atypical chest pain    a. 2015 Cath ([facility]): Nl cors. Nl EF; b.  10/2018 MV: No ischemia/infarct. EF>65%.   COPD (chronic obstructive pulmonary disease) (HCC)    Coronary vasospasm (HCC)    a. 06/2021 Cath: LM nl, LADnl, LCX large/nl, RCA 35p (spasm-->tx w/ IC NTG w/ resultant VF req defib).   Dysplastic nevus 02/02/2015   R upper back - excision   Dysplastic nevus 02/16/2015   L hip - excision   Dysplastic nevus 08/24/2015   L upper abdomen   Dysplastic nevus 08/11/2017   mid abdomen - excised   Dysplastic  nevus 07/29/2017   Left neck   Full dentures    Gastritis    GERD (gastroesophageal reflux disease)    HFrEF (heart failure with reduced ejection fraction) (HCC)    Hyperlipidemia    Iatrogenic ventricular fibrillation 07/02/2021   a.) during LHC --> coronary vasospasm Tx'd with IC NTG resulting in VF requiring defibrillation; no recurrence.   LBBB (left bundle branch block)    Melanoma (HCC) 08/24/2015   L buttocks - Breslow depth 0.46mm, Ulceration: absent, Mitotic index: 1 per HPF, TILs: Non-brisk, SLNbx: negative   NICM (nonischemic cardiomyopathy) (HCC)    a.) 08/04/2018: EF 40%; ant/anterosepal HK, GLS -13.7; mild-mod MR; G1DD.  b.) 06/22/2021 TTE: EF 40%, ant/antseptal/septal HK. G1DD. Nl RV fxn. Mild-mod MR; c.) 06/2021 LHC: Nl cors w/ RCA vasospasm (35%). VF w/ IC NTG admin req defib; d.) 07/02/2021 TTE: EF 35-40%. Glob HK. Mild MR. e.) TTE 02/07/2022: EF 35-40%; basal mid-anteroseptal AK; mild MR; G2DD.   Occlusion of right radial artery (HCC)    a. 06/2021 s/p cardiac cath-->u/s w/ R radial occlusion from puncture extending up entire forearm-->Lovenox  1mg /kg BID x 4 wks; b. 07/2021 U/S: Thrombus in prox Radial artery w/ minimal reconstitution of flow - improved w/ multiphasic waveforms in R brachial artery.   Palpitations    a. 07/2018 48h Holter: predominantly RSR, avg rate 106 (69-166 bpm), Rare PAC's and occas PVC's. 2 atrial runs up to 5 beats. No sustained arrhythmias or prolonged pauses. No symptoms reported; b. 07/2021 Zio: Avg HR 102 bpm, intermittent bundle branch block, rare PACs and PVCs, 9 atrial runs, longest 13.7 secs w/ max HR of 226 bpm.   Skin cancer, basal cell 01/2016   Wears dentures    full upper and lower   Past Surgical History:  Procedure Laterality Date   ABDOMINAL HYSTERECTOMY     APPENDECTOMY     BACK SURGERY     repeat lumbar herniated disc; apply cage   BUNIONECTOMY Right    CARDIAC CATHETERIZATION  09/20/2014   [facility] - Dr. Beau Bound   CHOLECYSTECTOMY      COLONOSCOPY WITH PROPOFOL  N/A 07/08/2016   Procedure: COLONOSCOPY WITH PROPOFOL ;  Surgeon: Marnee Sink, MD;  Location: [facility];  Service: Endoscopy;  Laterality: N/A;   DEBRIDEMENT TENNIS ELBOW     ESOPHAGOGASTRODUODENOSCOPY  09/16/2014   HARDWARE REMOVAL Left 01/04/2022   Procedure: HARDWARE REMOVAL;  Surgeon: Angel Barba, DPM;  Location: [facility];  Service: Podiatry;  Laterality: Left;   INGUINAL HERNIA REPAIR Right 04/17/2022   Procedure: HERNIA REPAIR INGUINAL ADULT;  Surgeon: Marshall Skeeter, MD;  Location: [facility];  Service: General;  Laterality: Right;  open   LEFT HEART CATH AND CORONARY ANGIOGRAPHY N/A 07/02/2021   Procedure: LEFT HEART CATH AND CORONARY ANGIOGRAPHY;  Surgeon: Sammy Crisp, MD;  Location: [facility];  Service: Cardiovascular;  Laterality: N/A;   LUMBAR DISC SURGERY     herniated disc   LUMBAR DISC SURGERY  took screws out from previous lumbar disc surgery   METATARSAL OSTEOTOMY  05/18/2013   MICROLARYNGOSCOPY WITH LASER Bilateral 03/17/2024   Procedure: MICROLARYNGOSCOPY, WITH PROCEDURE;  Surgeon: Rogers Clayman, MD;  Location: [facility];  Service: ENT;  Laterality: Bilateral;  suspension microdirect layrngoscopy with microflap excision vocal cord lesion   ROTATOR CUFF REPAIR Right    SHOULDER ARTHROSCOPY Right 10/25/2022   Procedure: Right shoulder arthroscopic mini open rotator cuff repair, subacromial decompression, and distal clavicle excision;  Surgeon: Lorri Rota, MD;  Location: [facility];  Service: Orthopedics;  Laterality: Right;   TONSILLECTOMY     Family History  Problem Relation Age of Onset   Diabetes Mother    High blood pressure Mother    Gout Mother    Cancer Father    Stroke Father    Arthritis/Rheumatoid Father    Diabetes Father    High blood pressure Father    Heart failure Father    Heart failure Brother    Diabetes Brother    Social History   Socioeconomic History   Marital  status: Married    Spouse name: Forestine Igo   Number of children: 2   Years of education: Not on file   Highest education level: 9th grade  Occupational History   Occupation: disability  Tobacco Use   Smoking status: Former    Current packs/day: 0.00    Average packs/day: 1 pack/day for 30.0 years (30.0 ttl pk-yrs)    Types: Cigarettes    Start date: 03/02/1988    Quit date: 03/02/2018    Years since quitting: 6.1   Smokeless tobacco: Never   Tobacco comments:    0.5 to 1 ppd >30 years  Vaping Use   Vaping status: Never Used  Substance and Sexual Activity   Alcohol use: No   Drug use: Yes    Types: Oxycodone    Sexual activity: Yes  Other Topics Concern   Not on file  Social History Narrative   Not on file   Social Drivers of Health   Financial Resource Strain: Low Risk  (04/09/2024)   Overall Financial Resource Strain (CARDIA)    Difficulty of Paying Living Expenses: Not hard at all  Food Insecurity: No Food Insecurity (04/09/2024)   Hunger Vital Sign    Worried About Running Out of Food in the Last Year: Never true    Ran Out of Food in the Last Year: Never true  Transportation Needs: No Transportation Needs (04/09/2024)   PRAPARE - Administrator, Civil Service (Medical): No    Lack of Transportation (Non-Medical): No  Physical Activity: Insufficiently Active (04/09/2024)   Exercise Vital Sign    Days of Exercise per Week: 3 days    Minutes of Exercise per Session: 30 min  Stress: No Stress Concern Present (04/09/2024)   [employer] of Occupational Health - Occupational Stress Questionnaire    Feeling of Stress : Not at all  Social Connections: Moderately Integrated (04/09/2024)   Social Connection and Isolation Panel [NHANES]    Frequency of Communication with Friends and Family: More than three times a week    Frequency of Social Gatherings with Friends and Family: More than three times a week    Attends Religious Services: More than 4 times per year     Active Member of [employer] or Organizations: No    Attends Banker Meetings: Never    Marital Status: Married    Tobacco Counseling Counseling given: Not Answered Tobacco  comments: 0.5 to 1 ppd >30 years    Clinical Intake:  Pre-visit preparation completed: Yes  Pain : 0-10 Pain Score: 4  Pain Type: Chronic pain Pain Location: Back Pain Orientation: Lower Pain Descriptors / Indicators: Aching, Discomfort, Constant Pain Onset: More than a month ago Pain Frequency: Constant Pain Relieving Factors: percocet  Pain Relieving Factors: percocet  BMI - recorded: 20.18 Nutritional Status: BMI of 19-24  Normal Nutritional Risks: None Diabetes: No  Lab Results  Component Value Date   HGBA1C 4.9 07/29/2017     How often do you need to have someone help you when you read instructions, pamphlets, or other written materials from your doctor or pharmacy?: 1 - Never  Interpreter Needed?: No  Information entered by :: Dellie Fergusson, LPN   Activities of Daily Living    04/09/2024   11:42 AM 03/17/2024    8:21 AM  In your present state of health, do you have any difficulty performing the following activities:  Hearing? 1 0  Vision? 0 0  Difficulty concentrating or making decisions? 0 0  Walking or climbing stairs? 1   Comment IF TOO MANY   Dressing or bathing? 0   Doing errands, shopping? 0   Preparing Food and eating ? N   Using the Toilet? N   In the past six months, have you accidently leaked urine? N   Do you have problems with loss of bowel control? N   Managing your Medications? N   Managing your Finances? N   Housekeeping or managing your Housekeeping? N     Patient Care Team: Raina Bunting, DO as PCP - General (Family Medicine) End, Veryl Gottron, MD as PCP - Cardiology (Cardiology) Pa, [facility] (Optometry)  Indicate any recent Medical Services you may have received from other than [facility] providers in the past year (date may be  approximate).     Assessment:   This is a routine wellness examination for [place].  Hearing/Vision screen Hearing Screening - Comments:: RIGHT EAR, GETTING AID SOON Vision Screening - Comments:: NO GLASSES- [facility]   Goals Addressed             This Visit's Progress    DIET - EAT MORE FRUITS AND VEGETABLES         Depression Screen     04/09/2024   11:39 AM 02/25/2024    8:30 AM 10/13/2023    2:57 PM 08/26/2023    1:58 PM 08/08/2023    9:10 AM 04/03/2023    9:25 AM 03/21/2023    3:41 PM  PHQ 2/9 Scores  PHQ - 2 Score 0 0 0 0 0 0 0  PHQ- 9 Score 0 0 0 0 0 0 0    Fall Risk     04/09/2024   11:42 AM 02/25/2024    8:30 AM 10/13/2023    2:58 PM 08/26/2023    1:58 PM 08/08/2023    9:10 AM  Fall Risk   Falls in the past year? 0 0 0 0 0  Number falls in past yr: 0  0  0  Injury with Fall? 0  0 0 0  Risk for fall due to : No Fall Risks    No Fall Risks  Follow up Falls prevention discussed;Falls evaluation completed    Falls evaluation completed    MEDICARE RISK AT HOME:  Medicare Risk at Home Any stairs in or around the home?: Yes If so, are there any without handrails?: No Home  free of loose throw rugs in walkways, pet beds, electrical cords, etc?: Yes Adequate lighting in your home to reduce risk of falls?: Yes Life alert?: No Use of a cane, walker or w/c?: No Grab bars in the bathroom?: Yes Shower chair or bench in shower?: No Elevated toilet seat or a handicapped toilet?: Yes  TIMED UP AND GO:  Was the test performed?  Yes  Length of time to ambulate 10 feet: 4 sec Gait steady and fast without use of assistive device  Cognitive Function: 6CIT completed        04/09/2024   11:44 AM 03/21/2023    3:52 PM 03/14/2022   11:29 AM 12/19/2020    1:41 PM  6CIT Screen  What Year? 0 points 0 points 0 points 0 points  What month? 0 points 0 points 0 points 0 points  What time? 0 points 0 points 0 points 3 points  Count back from 20 0 points 0 points 0 points 0  points  Months in reverse 0 points 0 points 0 points 0 points  Repeat phrase 0 points 2 points 0 points 6 points  Total Score 0 points 2 points 0 points 9 points    Immunizations Immunization History  Administered Date(s) Administered   Influenza Inj Mdck Quad With Preservative 10/22/2019   Influenza, Seasonal, Injecte, Preservative Fre 08/26/2023   Influenza,inj,Quad PF,6+ Mos 08/29/2016, 08/06/2017, 10/20/2018   Influenza,inj,quad, With Preservative 09/15/2017, 09/16/2019   Influenza-Unspecified 07/26/2022   PFIZER Comirnaty(Gray Top)Covid-19 Tri-Sucrose Vaccine 03/14/2020, 04/04/2020   PFIZER(Purple Top)SARS-COV-2 Vaccination 03/14/2020, 04/04/2020   PNEUMOCOCCAL CONJUGATE-20 07/26/2022   Pneumococcal-Unspecified 09/15/2017   Tdap 08/29/2016, 03/09/2017   Unspecified SARS-COV-2 Vaccination 04/04/2020    Screening Tests Health Maintenance  Topic Date Due   Hepatitis C Screening  Never done   Zoster Vaccines- Shingrix (1 of 2) Never done   Cervical Cancer Screening (HPV/Pap Cotest)  06/26/2021   COVID-19 Vaccine (6 - 2024-25 season) 08/17/2023   MAMMOGRAM  10/11/2023   INFLUENZA VACCINE  07/16/2024   Lung Cancer Screening  12/30/2024   Medicare Annual Wellness (AWV)  04/09/2025   Colonoscopy  07/08/2026   DTaP/Tdap/Td (3 - Td or Tdap) 03/10/2027   Pneumococcal Vaccine 36-62 Years old  Completed   HIV Screening  Completed   HPV VACCINES  Aged Out   Meningococcal B Vaccine  Aged Out    Health Maintenance  Health Maintenance Due  Topic Date Due   Hepatitis C Screening  Never done   Zoster Vaccines- Shingrix (1 of 2) Never done   Cervical Cancer Screening (HPV/Pap Cotest)  06/26/2021   COVID-19 Vaccine (6 - 2024-25 season) 08/17/2023   MAMMOGRAM  10/11/2023   Health Maintenance Items Addressed: UP TO DATE ON MAMMOGRAM, COLONOSCOPY, SHOTS UP TO DATE, WANTS NO MORE COVIDS  Additional Screening:  Vision Screening: Recommended annual ophthalmology exams for early  detection of glaucoma and other disorders of the eye.  Dental Screening: Recommended annual dental exams for proper oral hygiene  Community Resource Referral / Chronic Care Management: CRR required this visit?  No   CCM required this visit?  No     Plan:     I have personally reviewed and noted the following in the patient's chart:   Medical and social history Use of alcohol, tobacco or illicit drugs  Current medications and supplements including opioid prescriptions. Patient is currently taking opioid prescriptions. Information provided to patient regarding non-opioid alternatives. Patient advised to discuss non-opioid treatment plan with their provider. Functional ability  and status Nutritional status Physical activity Advanced directives List of other physicians Hospitalizations, surgeries, and ER visits in previous 12 months Vitals Screenings to include cognitive, depression, and falls Referrals and appointments  In addition, I have reviewed and discussed with patient certain preventive protocols, quality metrics, and best practice recommendations. A written personalized care plan for preventive services as well as general preventive health recommendations were provided to patient.     Pinky Bright, LPN   1/61/0960   After Visit Summary: (In Person-Declined) Patient declined AVS at this time.  Notes:  MAMMOGRAM ORDERED

## 2024-04-21 ENCOUNTER — Other Ambulatory Visit: Payer: Self-pay | Admitting: Primary Care

## 2024-04-23 ENCOUNTER — Other Ambulatory Visit: Payer: Self-pay | Admitting: Family Medicine

## 2024-04-23 DIAGNOSIS — R112 Nausea with vomiting, unspecified: Secondary | ICD-10-CM

## 2024-04-26 NOTE — Telephone Encounter (Signed)
 Requested medication (s) are due for refill today - yes  Requested medication (s) are on the active medication list -yes  Future visit scheduled -no  Last refill: 08/26/23 #20 3RF  Notes to clinic: non delegated Rx  Requested Prescriptions  Pending Prescriptions Disp Refills   promethazine  (PHENERGAN ) 25 MG tablet [Pharmacy Med Name: PROMETHAZINE  25 MG TABLET] 20 tablet 3    Sig: TAKE 1/2 TO 1 TABLET BY MOUTH EVERY 8 HOURS AS NEEDED FOR NAUSEA AND/OR VOMITING     Not Delegated - Gastroenterology: Antiemetics Failed - 04/26/2024  1:44 PM      Failed - This refill cannot be delegated      Passed - Valid encounter within last 6 months    Recent Outpatient Visits           2 months ago COPD with acute exacerbation [facility])   [place] [facility] [place], Kayleen Party, DO                 Requested Prescriptions  Pending Prescriptions Disp Refills   promethazine  (PHENERGAN ) 25 MG tablet [Pharmacy Med Name: PROMETHAZINE  25 MG TABLET] 20 tablet 3    Sig: TAKE 1/2 TO 1 TABLET BY MOUTH EVERY 8 HOURS AS NEEDED FOR NAUSEA AND/OR VOMITING     Not Delegated - Gastroenterology: Antiemetics Failed - 04/26/2024  1:44 PM      Failed - This refill cannot be delegated      Passed - Valid encounter within last 6 months    Recent Outpatient Visits           2 months ago COPD with acute exacerbation [facility])   [facility] [place], Kayleen Party, [place]

## 2024-05-02 DIAGNOSIS — T1592XA Foreign body on external eye, part unspecified, left eye, initial encounter: Secondary | ICD-10-CM | POA: Diagnosis not present

## 2024-05-03 DIAGNOSIS — H10502 Unspecified blepharoconjunctivitis, left eye: Secondary | ICD-10-CM | POA: Diagnosis not present

## 2024-05-03 DIAGNOSIS — S0502XA Injury of conjunctiva and corneal abrasion without foreign body, left eye, initial encounter: Secondary | ICD-10-CM | POA: Diagnosis not present

## 2024-05-04 ENCOUNTER — Other Ambulatory Visit: Payer: Self-pay

## 2024-05-04 DIAGNOSIS — Z122 Encounter for screening for malignant neoplasm of respiratory organs: Secondary | ICD-10-CM

## 2024-05-04 DIAGNOSIS — Z87891 Personal history of nicotine dependence: Secondary | ICD-10-CM

## 2024-05-26 ENCOUNTER — Encounter: Payer: Self-pay | Admitting: Family Medicine

## 2024-05-26 ENCOUNTER — Telehealth: Admitting: Family Medicine

## 2024-05-26 DIAGNOSIS — J441 Chronic obstructive pulmonary disease with (acute) exacerbation: Secondary | ICD-10-CM | POA: Diagnosis not present

## 2024-05-26 MED ORDER — PREDNISONE 20 MG PO TABS: ORAL_TABLET | ORAL | 0 refills | Status: DC

## 2024-05-26 MED ORDER — HYDROCOD POLI-CHLORPHE POLI ER 10-8 MG/5ML PO SUER: 5.0000 mL | Freq: Two times a day (BID) | ORAL | 0 refills | Status: DC | PRN

## 2024-05-26 MED ORDER — DOXYCYCLINE HYCLATE 100 MG PO TABS: 100.0000 mg | ORAL_TABLET | Freq: Two times a day (BID) | ORAL | 0 refills | Status: DC

## 2024-05-26 NOTE — Patient Instructions (Signed)
   Please schedule a Follow-up Appointment to: No follow-ups on file.  If you have any other questions or concerns, please feel free to call the office or send a message through MyChart. You may also schedule an earlier appointment if necessary.  Additionally, you may be receiving a survey about your experience at our office within a few days to 1 week by e-mail or mail. We value your feedback.  Saralyn Pilar, DO [facility], [place]

## 2024-05-26 NOTE — Progress Notes (Signed)
 Subjective:    Patient ID: Caroline Conley, female    DOB: Jan 18, 1966, 58 y.o.   MRN: [id number]  Caroline Conley is a 58 y.o. female presenting on 05/26/2024 for COPD   Virtual / Telehealth Encounter - Video Visit via MyChart The purpose of this virtual visit is to provide medical care while limiting exposure to the novel coronavirus (COVID19) for both patient and office staff.  Consent was obtained for remote visit:  Yes.   Answered questions that patient had about telehealth interaction:  Yes.   I discussed the limitations, risks, security and privacy concerns of performing an evaluation and management service by video/telephone. I also discussed with the patient that there may be a patient responsible charge related to this service. The patient expressed understanding and agreed to proceed.  Patient Location: Home Provider Location: [facility] (Office)  Participants in virtual visit: - Patient: Caroline Conley - CMA: Nolberto Batty CMA - Provider: Dr Romeo Co   HPI  Discussed the use of AI scribe software for clinical note transcription with the patient, who gave verbal consent to proceed.  History of Present Illness   Caroline Conley is a 58 year old female with COPD who presents with worsening respiratory symptoms.  Over the past four days, she has experienced a worsening of her COPD symptoms. Initially, she attributed her symptoms to allergies due to a runny nose, but she later developed chest tightness, congestion, and significant wheezing, which she recognized as a COPD exacerbation. She describes feeling generally unwell and notes that this episode has been particularly severe.  Her current medication regimen includes a Proair  inhaler, which she uses regularly, but she feels it is insufficient to manage her current symptoms. She has previously been treated with Levaquin  and doxycycline  for COPD exacerbations, alternating between the two. She also mentions that  a previous course of prednisone  was effective in managing her symptoms.  In addition to her respiratory issues, she reports shoulder pain, which she believes will be temporarily alleviated by the steroid treatment. Her medication refills include a Proair  inhaler, with two or three refills remaining, and she confirms her [facility] as [facility]         04/09/2024   11:39 AM 02/25/2024    8:30 AM 10/13/2023    2:57 PM  Depression screen PHQ 2/9  Decreased Interest 0 0 0  Down, Depressed, Hopeless 0 0 0  PHQ - 2 Score 0 0 0  Altered sleeping 0 0 0  Tired, decreased energy 0 0 0  Change in appetite 0 0 0  Feeling bad or failure about yourself  0 0 0  Trouble concentrating 0 0 0  Moving slowly or fidgety/restless 0 0 0  Suicidal thoughts 0 0 0  PHQ-9 Score 0 0 0  Difficult doing work/chores Not difficult at all Not difficult at all        02/25/2024    8:31 AM 10/13/2023    2:58 PM 08/26/2023    1:58 PM 08/08/2023    9:11 AM  GAD 7 : Generalized Anxiety Score  Nervous, Anxious, on Edge 0 0 0 0  Control/stop worrying 0 0 0 0  Worry too much - different things 0 0 0 0  Trouble relaxing 0 0 0 0  Restless 0 0 0 0  Easily annoyed or irritable 0 0 0 0  Afraid - awful might happen 0 0 0 0  Total GAD 7 Score 0 0 0 0  Anxiety Difficulty Not difficult at all   Not difficult at all    Social History   Tobacco Use   Smoking status: Former    Current packs/day: 0.00    Average packs/day: 1 pack/day for 30.0 years (30.0 ttl pk-yrs)    Types: Cigarettes    Start date: 03/02/1988    Quit date: 03/02/2018    Years since quitting: 6.2   Smokeless tobacco: Never   Tobacco comments:    0.5 to 1 ppd >30 years  Vaping Use   Vaping status: Never Used  Substance Use Topics   Alcohol use: No   Drug use: Yes    Types: Oxycodone     Review of Systems Per HPI unless specifically indicated above     Objective:     There were no vitals taken for this visit.  Wt Readings from Last 3  Encounters:  04/09/24 125 lb (56.7 kg)  03/17/24 125 lb (56.7 kg)  02/25/24 125 lb (56.7 kg)     Physical Exam  Note examination was completely remotely via video observation objective data only  Gen - well-appearing, no acute distress or apparent pain, comfortable HEENT - eyes appear clear without discharge or redness Heart/Lungs - cannot examine virtually - observed evidence of coughing Abd - cannot examine virtually  Skin - face visible today- no rash Neuro - awake, alert, oriented Psych - not anxious appearing   Results for orders placed or performed during the hospital encounter of 03/17/24  Surgical pathology   Collection Time: 03/17/24 12:00 AM  Result Value Ref Range   SURGICAL PATHOLOGY      SURGICAL PATHOLOGY [facility] [address] [place], [place] [address] Telephone [phone] or [phone] Fax [phone]  REPORT OF SURGICAL PATHOLOGY   Accession #: [id number] Patient Name: Caroline Conley, Caroline Conley Visit # : [id number]  MRN: [id number] Physician: Rogers Clayman DOB/Age [birth year] (Age: 67) Gender: F Collected Date: 03/17/2024 Received Date: 03/17/2024  FINAL DIAGNOSIS       1. Vocal cord, biopsy, Right lesion :       - KERATOSIS WITH FOCAL LOW-GRADE DYPLASIA / SQUAMOUS INTRAEPITHELIAL LESION      ARISING IN A VOCAL CORD POLYP.       Diagnosis Note : This case underwent intradepartmental consultation and Dr.      [place] concurs with the interpretation.      DATE SIGNED OUT: 03/18/2024 ELECTRONIC SIGNATURE : Brunetta Capes Md, Alexandria Ida , Pathologist, Electronic Signature  MICROSCOPIC DESCRIPTION  CASE COMMENTS STAINS USED IN DIAGNOSIS: H&E    CLINICAL HISTORY  SPECIMEN(S) OBTAINED 1. Vocal cord, biopsy, Right  Lesion  SPECIMEN COMMENTS: SPECIMEN CLINICAL INFORMATION: 1. Polyp of vocal cord    Gross Description 1. Received in formalin labeled with the patient's name and Right vocal cord lesion are two 0.4 cm  pieces of tan soft tissue, submitted in toto in a single cassette.               (LEF, 03/17/2024)        Report signed out from the following location(s) [place] [address] [place], [place] [address] CLIA #: [id number]  [facility] [address] [place], [place] [address] CLIA #: [id number]       Assessment & Plan:   Problem List Items Addressed This Visit   None Visit Diagnoses       COPD with acute exacerbation (HCC)    -  Primary  Relevant Medications   chlorpheniramine-HYDROcodone  (TUSSIONEX) 10-8 MG/5ML   doxycycline  (VIBRA -TABS) 100 MG tablet   predniSONE  (DELTASONE ) 20 MG tablet        Chronic Obstructive Pulmonary Disease (COPD) exacerbation Confirmed COPD exacerbation with significant symptoms. Last flare 3 months ago treated with Levaquin . Now Doxycycline  preferred for extended treatment. Steroid regimen effective previously. - Prescribe doxycycline  100 mg twice daily for 10 days. - Prescribe prednisone  with a tapering dose over 12 days (4 tablets for 4 days, 2 tablets for 4 days, 1 tablet for 4 days). - Refill Proair  inhaler as needed. - Continue Trelegy, Daliresp  - Order Tussinex syrup.  Right shoulder pain Ongoing right shoulder pain. Corticosteroid injection planned for targeted relief. - Schedule an appointment for a corticosteroid injection in the right shoulder on June 03, 2024.        No orders of the defined types were placed in this encounter.   Meds ordered this encounter  Medications   chlorpheniramine-HYDROcodone  (TUSSIONEX) 10-8 MG/5ML    Sig: Take 5 mLs by mouth every 12 (twelve) hours as needed for cough.    Dispense:  140 mL    Refill:  0   doxycycline  (VIBRA -TABS) 100 MG tablet    Sig: Take 1 tablet (100 mg total) by mouth 2 (two) times daily. For 10 days. Take with full glass of water , stay upright 30 min after taking.    Dispense:  20 tablet    Refill:  0   predniSONE  (DELTASONE )  20 MG tablet    Sig: Take 2 tablets daily (40mg ) for 4 days, take 1 tab daily (20mg ) for 4 days, take half tab daily (10mg ) for 4 days    Dispense:  14 tablet    Refill:  0    Follow up plan: Return if symptoms worsen or fail to improve.   Patient verbalizes understanding with the above medical recommendations including the limitation of remote medical advice.  Specific follow-up and call-back criteria were given for patient to follow-up or seek medical care more urgently if needed.  Total duration of direct patient care provided via video conference: 10 minutes   Domingo Friend, DO [facility] 05/26/2024, 4:20 PM

## 2024-06-03 ENCOUNTER — Encounter: Payer: Self-pay | Admitting: Family Medicine

## 2024-06-03 ENCOUNTER — Ambulatory Visit (INDEPENDENT_AMBULATORY_CARE_PROVIDER_SITE_OTHER): Admitting: Family Medicine

## 2024-06-03 VITALS — BP 130/74 | HR 102 | Ht 66.0 in | Wt 128.1 lb

## 2024-06-03 DIAGNOSIS — M7551 Bursitis of right shoulder: Secondary | ICD-10-CM | POA: Diagnosis not present

## 2024-06-03 DIAGNOSIS — M25511 Pain in right shoulder: Secondary | ICD-10-CM | POA: Diagnosis not present

## 2024-06-03 MED ORDER — LIDOCAINE HCL (PF) 1 % IJ SOLN
4.0000 mL | Freq: Once | INTRAMUSCULAR | Status: AC
  Administered 2024-06-03: 4 mL

## 2024-06-03 MED ORDER — METHYLPREDNISOLONE ACETATE 40 MG/ML IJ SUSP
40.0000 mg | Freq: Once | INTRAMUSCULAR | Status: AC
  Administered 2024-06-03: 40 mg via INTRA_ARTICULAR

## 2024-06-03 NOTE — Patient Instructions (Addendum)

## 2024-06-03 NOTE — Progress Notes (Signed)
 Subjective:    Patient ID: Caroline Conley, female    DOB: [birth year], 58 y.o.   MRN: [id number]  BRENDI MCCARROLL is a 58 y.o. female presenting on 06/03/2024 for Shoulder Pain   HPI  Discussed the use of AI scribe software for clinical note transcription with the patient, who gave verbal consent to proceed.  History of Present Illness   Caroline Conley is a 58 year old female who presents for a right shoulder injection.  FOLLOW-UP SHOULDER PAIN / history of chronic R frozen shoulder and prior rotator cuff surgery   - Last visit with me for this problem with steroid injection R shoulder back in 07/2023 - Interval update with improved R shoulder pain from bursitis for past >4 yrs years. Gradual worsening again but did well with prior shots   - Today patient reports ready for new injection. Known chronic problem for few years after her R shoulder surgery, seems to hurt worse post-op than before, has had frozen shoulder, in past received steroid subacromial injections into bursa with good results.  - Requesting new injection today, she was followed by [facility] in [place] in past.   - Taking NSAID and chronic pain meds percocet with some relief - Her range of motion has never improved post op due to frozen shoulder - No new fall or injury - Denies any redness, bruising, swelling, numbness, tingling, weakness  She also has a history of COPD, which recently experienced a flare-up. This was managed with doxycycline  and steroids, resulting in significant improvement in her breathing.       04/09/2024   11:39 AM 02/25/2024    8:30 AM 10/13/2023    2:57 PM  Depression screen PHQ 2/9  Decreased Interest 0 0 0  Down, Depressed, Hopeless 0 0 0  PHQ - 2 Score 0 0 0  Altered sleeping 0 0 0  Tired, decreased energy 0 0 0  Change in appetite 0 0 0  Feeling bad or failure about yourself  0 0 0  Trouble concentrating 0 0 0  Moving slowly or fidgety/restless 0 0 0  Suicidal thoughts 0 0 0   PHQ-9 Score 0 0 0  Difficult doing work/chores Not difficult at all Not difficult at all        02/25/2024    8:31 AM 10/13/2023    2:58 PM 08/26/2023    1:58 PM 08/08/2023    9:11 AM  GAD 7 : Generalized Anxiety Score  Nervous, Anxious, on Edge 0 0 0 0  Control/stop worrying 0 0 0 0  Worry too much - different things 0 0 0 0  Trouble relaxing 0 0 0 0  Restless 0 0 0 0  Easily annoyed or irritable 0 0 0 0  Afraid - awful might happen 0 0 0 0  Total GAD 7 Score 0 0 0 0  Anxiety Difficulty Not difficult at all   Not difficult at all    Social History   Tobacco Use   Smoking status: Former    Current packs/day: 0.00    Average packs/day: 1 pack/day for 30.0 years (30.0 ttl pk-yrs)    Types: Cigarettes    Start date: 03/02/1988    Quit date: 03/02/2018    Years since quitting: 6.2   Smokeless tobacco: Never   Tobacco comments:    0.5 to 1 ppd >30 years  Vaping Use   Vaping status: Never Used  Substance Use Topics   Alcohol use:  No   Drug use: Yes    Types: Oxycodone     Review of Systems Per HPI unless specifically indicated above     Objective:    BP 130/74 (BP Location: Left Arm, Patient Position: Sitting, Cuff Size: Normal)   Pulse (!) 102   Ht 5' 6 (1.676 m)   Wt 128 lb 2 oz (58.1 kg)   SpO2 92%   BMI 20.68 kg/m   Wt Readings from Last 3 Encounters:  06/03/24 128 lb 2 oz (58.1 kg)  04/09/24 125 lb (56.7 kg)  03/17/24 125 lb (56.7 kg)    Physical Exam Vitals and nursing note reviewed.  Constitutional:      General: She is not in acute distress.    Appearance: Normal appearance. She is well-developed. She is not diaphoretic.     Comments: Well-appearing, comfortable, cooperative  HENT:     Head: Normocephalic and atraumatic.   Eyes:     General:        Right eye: No discharge.        Left eye: No discharge.     Conjunctiva/sclera: Conjunctivae normal.    Cardiovascular:     Rate and Rhythm: Normal rate.  Pulmonary:     Effort: Pulmonary  effort is normal.   Musculoskeletal:     Comments: Right Shoulder reduced range of motion but has intact forward flex to 90* and some abduction.   Skin:    General: Skin is warm and dry.     Findings: No erythema or rash.   Neurological:     Mental Status: She is alert and oriented to person, place, and time.   Psychiatric:        Mood and Affect: Mood normal.        Behavior: Behavior normal.        Thought Content: Thought content normal.     Comments: Well groomed, good eye contact, normal speech and thoughts     ________________________________________________________ PROCEDURE NOTE Date: 06/03/24 Right Shoulder subacromial injection Discussed benefits and risks (including pain, bleeding, infection, steroid flare). Verbal consent given by patient. Medication:  1 cc Depo-medrol  40mg  and 4 cc Lidocaine  1% without epi Time Out taken  Landmarks identified. Area cleansed with alcohol wipes. Using 21 gauge and 1, 1/2 inch needle, Right subacromial bursa space was injected (with above listed medication) via posterior approach cold spray used for superficial anesthetic. Sterile bandage placed. Patient tolerated procedure well without bleeding or paresthesias. No complications.  Results for orders placed or performed during the hospital encounter of 03/17/24  Surgical pathology   Collection Time: 03/17/24 12:00 AM  Result Value Ref Range   SURGICAL PATHOLOGY      SURGICAL PATHOLOGY [facility] [address] [place], [place] [address] Telephone [phone] or [phone] Fax [phone]  REPORT OF SURGICAL PATHOLOGY   Accession #: [id number] Patient Name: Caroline Conley, Caroline Conley Visit # : [id number]  MRN: [id number] Physician: Rogers Clayman DOB/Age [birth year] (Age: 29) Gender: F Collected Date: 03/17/2024 Received Date: 03/17/2024  FINAL DIAGNOSIS       1. Vocal cord, biopsy, Right lesion :       - KERATOSIS WITH FOCAL LOW-GRADE DYPLASIA /  SQUAMOUS INTRAEPITHELIAL LESION      ARISING IN A VOCAL CORD POLYP.       Diagnosis Note : This case underwent intradepartmental consultation and Dr.      [place] concurs with the interpretation.      DATE SIGNED OUT: 03/18/2024  ELECTRONIC SIGNATURE : Rubinas Md, Alexandria Ida , Sports administrator, International aid/development worker  MICROSCOPIC DESCRIPTION  CASE COMMENTS STAINS USED IN DIAGNOSIS: H&E    CLINICAL HISTORY  SPECIMEN(S) OBTAINED 1. Vocal cord, biopsy, Right  Lesion  SPECIMEN COMMENTS: SPECIMEN CLINICAL INFORMATION: 1. Polyp of vocal cord    Gross Description 1. Received in formalin labeled with the patient's name and Right vocal cord lesion are two 0.4 cm pieces of tan soft tissue, submitted in toto in a single cassette.               (LEF, 03/17/2024)        Report signed out from the following location(s) [place] [address], [place] [address] CLIA #: [id number]  [facility] [address] [place], [place] [address] CLIA #: [id number]       Assessment & Plan:   Problem List Items Addressed This Visit     Chronic bursitis of right shoulder   Shoulder joint pain - Primary     Right Shoulder Pain, chronic Chronic Bursitis Last inj 07/2023, usually about 1 x per year. Chronic pain managed with corticosteroid injections. Prefers injections over surgery to delay shoulder replacement. - Administer right shoulder corticosteroid injection. - Monitor response to injection, expecting relief by this evening or tomorrow. Future reconsider Ortho  Chronic Obstructive Pulmonary Disease (COPD) Recent exacerbation resolved with doxycycline , steroids, and other treatments. Breathing improved.       No orders of the defined types were placed in this encounter.   Meds ordered this encounter  Medications   methylPREDNISolone  acetate (DEPO-MEDROL ) injection 40 mg   lidocaine  (PF) (XYLOCAINE ) 1 % injection 4 mL    Follow up  plan: Return if symptoms worsen or fail to improve.  Domingo Friend, DO [facility] 06/03/2024, 11:45 AM

## 2024-06-03 NOTE — Addendum Note (Signed)
 Addended by: Raina Bunting on: 06/03/2024 01:17 PM   Modules accepted: Level of Service

## 2024-06-22 ENCOUNTER — Ambulatory Visit: Payer: Medicare HMO | Admitting: Dermatology

## 2024-06-22 ENCOUNTER — Encounter: Payer: Self-pay | Admitting: Dermatology

## 2024-06-22 DIAGNOSIS — D239 Other benign neoplasm of skin, unspecified: Secondary | ICD-10-CM

## 2024-06-22 DIAGNOSIS — I781 Nevus, non-neoplastic: Secondary | ICD-10-CM

## 2024-06-22 DIAGNOSIS — L820 Inflamed seborrheic keratosis: Secondary | ICD-10-CM

## 2024-06-22 DIAGNOSIS — Z1283 Encounter for screening for malignant neoplasm of skin: Secondary | ICD-10-CM | POA: Diagnosis not present

## 2024-06-22 DIAGNOSIS — D1801 Hemangioma of skin and subcutaneous tissue: Secondary | ICD-10-CM

## 2024-06-22 DIAGNOSIS — D492 Neoplasm of unspecified behavior of bone, soft tissue, and skin: Secondary | ICD-10-CM

## 2024-06-22 DIAGNOSIS — D235 Other benign neoplasm of skin of trunk: Secondary | ICD-10-CM | POA: Diagnosis not present

## 2024-06-22 DIAGNOSIS — D2272 Melanocytic nevi of left lower limb, including hip: Secondary | ICD-10-CM

## 2024-06-22 DIAGNOSIS — L821 Other seborrheic keratosis: Secondary | ICD-10-CM | POA: Diagnosis not present

## 2024-06-22 DIAGNOSIS — L814 Other melanin hyperpigmentation: Secondary | ICD-10-CM

## 2024-06-22 DIAGNOSIS — Z85828 Personal history of other malignant neoplasm of skin: Secondary | ICD-10-CM

## 2024-06-22 DIAGNOSIS — D485 Neoplasm of uncertain behavior of skin: Secondary | ICD-10-CM

## 2024-06-22 DIAGNOSIS — W908XXA Exposure to other nonionizing radiation, initial encounter: Secondary | ICD-10-CM | POA: Diagnosis not present

## 2024-06-22 DIAGNOSIS — Z85820 Personal history of malignant melanoma of skin: Secondary | ICD-10-CM

## 2024-06-22 DIAGNOSIS — L578 Other skin changes due to chronic exposure to nonionizing radiation: Secondary | ICD-10-CM

## 2024-06-22 DIAGNOSIS — D2262 Melanocytic nevi of left upper limb, including shoulder: Secondary | ICD-10-CM | POA: Diagnosis not present

## 2024-06-22 DIAGNOSIS — Z86018 Personal history of other benign neoplasm: Secondary | ICD-10-CM

## 2024-06-22 DIAGNOSIS — D225 Melanocytic nevi of trunk: Secondary | ICD-10-CM

## 2024-06-22 DIAGNOSIS — D2372 Other benign neoplasm of skin of left lower limb, including hip: Secondary | ICD-10-CM | POA: Diagnosis not present

## 2024-06-22 DIAGNOSIS — D229 Melanocytic nevi, unspecified: Secondary | ICD-10-CM

## 2024-06-22 NOTE — Patient Instructions (Addendum)
 Cryotherapy Aftercare  Wash gently with soap and water everyday.   Apply Vaseline and Band-Aid daily until healed.    Wound Care Instructions  Cleanse wound gently with soap and water once a day then pat dry with clean gauze. Apply a thin coat of Petrolatum (petroleum jelly, "Vaseline") over the wound (unless you have an allergy to this). We recommend that you use a new, sterile tube of Vaseline. Do not pick or remove scabs. Do not remove the yellow or white "healing tissue" from the base of the wound.  Cover the wound with fresh, clean, nonstick gauze and secure with paper tape. You may use Band-Aids in place of gauze and tape if the wound is small enough, but would recommend trimming much of the tape off as there is often too much. Sometimes Band-Aids can irritate the skin.  You should call the office for your biopsy report after 1 week if you have not already been contacted.  If you experience any problems, such as abnormal amounts of bleeding, swelling, significant bruising, significant pain, or evidence of infection, please call the office immediately.  FOR ADULT SURGERY PATIENTS: If you need something for pain relief you may take 1 extra strength Tylenol (acetaminophen) AND 2 Ibuprofen (200mg  each) together every 4 hours as needed for pain. (do not take these if you are allergic to them or if you have a reason you should not take them.) Typically, you may only need pain medication for 1 to 3 days.   Melanoma ABCDEs  Melanoma is the most dangerous type of skin cancer, and is the leading cause of death from skin disease.  You are more likely to develop melanoma if you: Have light-colored skin, light-colored eyes, or red or blond hair Spend a lot of time in the sun Tan regularly, either outdoors or in a tanning bed Have had blistering sunburns, especially during childhood Have a close family member who has had a melanoma Have atypical moles or large birthmarks  Early detection of  melanoma is key since treatment is typically straightforward and cure rates are extremely high if we catch it early.   The first sign of melanoma is often a change in a mole or a new dark spot.  The ABCDE system is a way of remembering the signs of melanoma.  A for asymmetry:  The two halves do not match. B for border:  The edges of the growth are irregular. C for color:  A mixture of colors are present instead of an even brown color. D for diameter:  Melanomas are usually (but not always) greater than 6mm - the size of a pencil eraser. E for evolution:  The spot keeps changing in size, shape, and color.  Please check your skin once per month between visits. You can use a small mirror in front and a large mirror behind you to keep an eye on the back side or your body.   If you see any new or changing lesions before your next follow-up, please call to schedule a visit.  Please continue daily skin protection including broad spectrum sunscreen SPF 30+ to sun-exposed areas, reapplying every 2 hours as needed when you're outdoors.    Due to recent changes in healthcare laws, you may see results of your pathology and/or laboratory studies on MyChart before the doctors have had a chance to review them. We understand that in some cases there may be results that are confusing or concerning to you. Please understand that not all  results are received at the same time and often the doctors may need to interpret multiple results in order to provide you with the best plan of care or course of treatment. Therefore, we ask that you please give [place] 2 business days to thoroughly review all your results before contacting the office for clarification. Should we see a critical lab result, you will be contacted sooner.   If You Need Anything After Your Visit  If you have any questions or concerns for your doctor, please call our main line at [phone] and press option 4 to reach your doctor's medical assistant. If  no one answers, please leave a voicemail as directed and we will return your call as soon as possible. Messages left after 4 pm will be answered the following business day.   You may also send [place] a message via MyChart. We typically respond to MyChart messages within 1-2 business days.  For prescription refills, please ask your pharmacy to contact our office. Our fax number is [phone].  If you have an urgent issue when the clinic is closed that cannot wait until the next business day, you can page your doctor at the number below.    Please note that while we do our best to be available for urgent issues outside of office hours, we are not available 24/7.   If you have an urgent issue and are unable to reach [place], you may choose to seek medical care at your doctor's office, retail clinic, urgent care center, or emergency room.  If you have a medical emergency, please immediately call 911 or go to the emergency department.  Pager Numbers  - Dr. Gwen Pounds: [phone]  - Dr. Roseanne Reno: [phone]  - Dr. Katrinka Blazing: [phone]   In the event of inclement weather, please call our main line at [phone] for an update on the status of any delays or closures.  Dermatology Medication Tips: Please keep the boxes that topical medications come in in order to help keep track of the instructions about where and how to use these. Pharmacies typically print the medication instructions only on the boxes and not directly on the medication tubes.   If your medication is too expensive, please contact our office at [phone] option 4 or send [place] a message through MyChart.   We are unable to tell what your co-pay for medications will be in advance as this is different depending on your insurance coverage. However, we may be able to find a substitute medication at lower cost or fill out paperwork to get insurance to cover a needed medication.   If a prior authorization is required to get your medication  covered by your insurance company, please allow [place] 1-2 business days to complete this process.  Drug prices often vary depending on where the prescription is filled and some pharmacies may offer cheaper prices.  The website www.goodrx.com contains coupons for medications through different pharmacies. The prices here do not account for what the cost may be with help from insurance (it may be cheaper with your insurance), but the website can give you the price if you did not use any insurance.  - You can print the associated coupon and take it with your prescription to the pharmacy.  - You may also stop by our office during regular business hours and pick up a GoodRx coupon card.  - If you need your prescription sent electronically to a different pharmacy, notify our office through [facility] or by phone at  [phone] option 4.     Si Usted Necesita Algo Despus de Su Visita  Tambin puede enviarnos un mensaje a travs de Clinical cytogeneticist. Por lo general respondemos a los mensajes de MyChart en el transcurso de 1 a 2 das hbiles.  Para renovar recetas, por favor pida a su farmacia que se ponga en contacto con nuestra oficina. Annie Sable de fax es [place] [phone].  Si tiene un asunto urgente cuando la clnica est cerrada y que no puede esperar hasta el siguiente da hbil, puede llamar/localizar a su doctor(a) al nmero que aparece a continuacin.   Por favor, tenga en cuenta que aunque hacemos todo lo posible para estar disponibles para asuntos urgentes fuera del horario de [place], no estamos disponibles las 24 horas del da, los 7 [address]  [address] de la [place].   Si tiene un problema urgente y no puede comunicarse con nosotros, puede optar por buscar atencin mdica  en el consultorio de su doctor(a), en una clnica privada, en un centro de atencin urgente o en una sala de emergencias.  Si tiene Engineer, drilling, por favor llame inmediatamente al 911 o vaya a la sala de  emergencias.  Nmeros de bper  - Dr. Gwen Pounds: [phone]  - Dra. Roseanne Reno: [phone]  - Dr. Katrinka Blazing: [phone]   En caso de inclemencias del tiempo, por favor llame a Lacy Duverney principal al [phone] para una actualizacin sobre el [place] de cualquier retraso o cierre.  Consejos para la medicacin en dermatologa: Por favor, guarde las cajas en las que vienen los medicamentos de uso tpico para ayudarle a seguir las instrucciones sobre dnde y cmo usarlos. Las farmacias generalmente imprimen las instrucciones del medicamento slo en las cajas y no directamente en los tubos del [place].   Si su medicamento es muy caro, por favor, pngase en contacto con Rolm Gala llamando al [phone] y presione la opcin 4 o envenos un mensaje a travs de Clinical cytogeneticist.   No podemos decirle cul ser su copago por los medicamentos por adelantado ya que esto es diferente dependiendo de la cobertura de su seguro. Sin embargo, es posible que podamos encontrar un medicamento sustituto a Audiological scientist un formulario para que el seguro cubra el medicamento que se considera necesario.   Si se requiere una autorizacin previa para que su compaa de seguros [place] su medicamento, por favor permtanos de 1 a 2 das hbiles para completar [address]  Los precios de los medicamentos varan con frecuencia dependiendo del Environmental consultant de dnde se surte la receta y alguna farmacias pueden ofrecer precios ms baratos.  El sitio web www.goodrx.com tiene cupones para medicamentos de Health and safety inspector. Los precios aqu no tienen en cuenta lo que podra costar con la ayuda del seguro (puede ser ms barato con su seguro), pero el sitio web puede darle el precio si no utiliz Tourist information centre manager.  - Puede imprimir el cupn correspondiente y llevarlo con su receta a la farmacia.  - Tambin puede pasar por nuestra oficina durante el horario de atencin regular y Education officer, museum una tarjeta de cupones de GoodRx.  - Si  necesita que su receta se enve electrnicamente a una farmacia diferente, informe a nuestra oficina a travs de MyChart de [place] o por telfono llamando al [phone] y presione la opcin 4.

## 2024-06-22 NOTE — Progress Notes (Signed)
 Follow-Up Visit   Subjective  Caroline Conley is a 58 y.o. female who presents for the following: Skin Cancer Screening and Full Body Skin Exam  The patient presents for Total-Body Skin Exam (TBSE) for skin cancer screening and mole check. The patient has spots, moles and lesions to be evaluated, some may be new or changing and the patient may have concern these could be cancer.  Hx MM, BCC, DN. Patient with a few bumps under right arm and right hip, under arm is new.  Patient accompanied by cousin, Barnie.   The following portions of the chart were reviewed this encounter and updated as appropriate: medications, allergies, medical history  Review of Systems:  No other skin or systemic complaints except as noted in HPI or Assessment and Plan.  Objective  Well appearing patient in no apparent distress; mood and affect are within normal limits.  A full examination was performed including scalp, head, eyes, ears, nose, lips, neck, chest, axillae, abdomen, back, buttocks, bilateral upper extremities, bilateral lower extremities, hands, feet, fingers, toes, fingernails, and toenails. All findings within normal limits unless otherwise noted below.   Relevant physical exam findings are noted in the Assessment and Plan.  Right Axilla Erythematous stuck-on, waxy papule left spinal mid upper back 3 mm speckled brown macule   Assessment & Plan   SKIN CANCER SCREENING PERFORMED TODAY.  ACTINIC DAMAGE - Chronic condition, secondary to cumulative UV/sun exposure - diffuse scaly erythematous macules with underlying dyspigmentation - Recommend daily broad spectrum sunscreen SPF 30+ to sun-exposed areas, reapply every 2 hours as needed.  - Staying in the shade or wearing long sleeves, sun glasses (UVA+UVB protection) and wide brim hats (4-inch brim around the entire circumference of the hat) are also recommended for sun protection.  - Call for new or changing lesions.  LENTIGINES,  SEBORRHEIC KERATOSES, HEMANGIOMAS - Benign normal skin lesions - Benign-appearing - Call for any changes  MELANOCYTIC NEVI - Tan-brown and/or pink-flesh-colored symmetric macules and papules - 2 mm dark brown macule at left spinal lower back - 2.5 mm medium dark brown macule at left anterior shoulder - 3 mm brown macule,darker edge at left flank - 1.5 mm dark brown macule at left posterior thigh, photo compared, no changes - 2.5 mm med dark brown macule at central upper abd, photo compared, no changes - Benign appearing on exam today - Observation - Call clinic for new or changing moles - Recommend daily use of broad spectrum spf 30+ sunscreen to sun-exposed areas.   History of Dysplastic Nevi - No evidence of recurrence today - Recommend regular full body skin exams - Recommend daily broad spectrum sunscreen SPF 30+ to sun-exposed areas, reapply every 2 hours as needed.  - Call if any new or changing lesions are noted between office visits   HISTORY OF MELANOMA - Left buttock 2016, 0.7mm, SLN neg. - No evidence of recurrence today - Recommend regular full body skin exams - Recommend daily broad spectrum sunscreen SPF 30+ to sun-exposed areas, reapply every 2 hours as needed.  - Call if any new or changing lesions are noted between office visits  HISTORY OF BASAL CELL CARCINOMA OF THE SKIN - No evidence of recurrence today - Recommend regular full body skin exams - Recommend daily broad spectrum sunscreen SPF 30+ to sun-exposed areas, reapply every 2 hours as needed.  - Call if any new or changing lesions are noted between office visits   DERMATOFIBROMA vs NEVUS Exam: Firm pink/brown papulenodule with  dimple sign at left hip. Treatment Plan: A dermatofibroma is a benign growth possibly related to trauma, such as an insect bite, cut from shaving, or inflamed acne-type bump.  Treatment options to remove include shave or excision with resulting scar and risk of recurrence.  Since  benign-appearing and not bothersome, will observe for now.   TELANGIECTASIA - mid forehead Exam: 1 cm blanching pink macule with mild central depression, pt states it was biopsied years ago and showed telangiectasia, no bleeding or scabbing, hasn't changed per pt, photo compared, no changes  Treatment Plan: Benign-appearing. Stable compared to previous visit. Observation.  Call clinic for new or changing moles.  Recommend daily use of broad spectrum spf 30+ sunscreen to sun-exposed areas.     INFLAMED SEBORRHEIC KERATOSIS Right Axilla Symptomatic, irritating, patient would like treated.  Benign-appearing.  Call clinic for new or changing lesions.   Destruction of lesion - Right Axilla  Destruction method: cryotherapy   Informed consent: discussed and consent obtained   Lesion destroyed using liquid nitrogen: Yes   Region frozen until ice ball extended beyond lesion: Yes   Outcome: patient tolerated procedure well with no complications   Post-procedure details: wound care instructions given   Additional details:  Prior to procedure, discussed risks of blister formation, small wound, skin dyspigmentation, or rare scar following cryotherapy. Recommend Vaseline ointment to treated areas while healing.   NEOPLASM OF UNCERTAIN BEHAVIOR OF SKIN left spinal mid upper back Epidermal / dermal shaving  Lesion diameter (cm):  0.5 Informed consent: discussed and consent obtained   Patient was prepped and draped in usual sterile fashion: Area prepped with alcohol. Anesthesia: the lesion was anesthetized in a standard fashion   Anesthetic:  1% lidocaine  w/ epinephrine  1-100,000 buffered w/ 8.4% NaHCO3 Instrument used: flexible razor blade   Hemostasis achieved with: pressure, aluminum chloride and electrodesiccation   Outcome: patient tolerated procedure well   Post-procedure details: wound care instructions given   Post-procedure details comment:  Ointment and small bandage applied.    Specimen 1 - Surgical pathology Differential Diagnosis: Nevus r/o Dysplasia  Check Margins: No 3 mm speckled brown macule Return in about 1 year (around 06/22/2025) for TBSE, with Dr. Jackquline, HxDN, HxBCC.  LILLETTE Lonell Drones, RMA, am acting as scribe for Rexene Jackquline, MD .   Documentation: I have reviewed the above documentation for accuracy and completeness, and I agree with the above.  Rexene Jackquline, MD

## 2024-06-24 ENCOUNTER — Ambulatory Visit: Payer: Self-pay | Admitting: Dermatology

## 2024-06-24 LAB — SURGICAL PATHOLOGY

## 2024-06-28 NOTE — Telephone Encounter (Signed)
-----   Message from Rexene Rattler sent at 06/24/2024  6:33 PM EDT ----- 1. Skin, left spinal mid upper back :       DERMATOFIBROMA, BASE INVOLVED  Benign. May recur.  A dermatofibroma is a benign growth possibly related to trauma, such as an insect bite, cut from shaving, or inflamed acne-type bump.  - please call patient ----- Message ----- From: Interface, Lab In Three Zero One Sent: 06/24/2024   6:13 PM EDT To: Rexene Rattler, MD

## 2024-06-28 NOTE — Telephone Encounter (Signed)
 Left pt msg to call for bx results/sh

## 2024-06-30 NOTE — Telephone Encounter (Signed)
 Advised pt of bx results/sh ?

## 2024-06-30 NOTE — Telephone Encounter (Signed)
-----   Message from Rexene Rattler sent at 06/24/2024  6:33 PM EDT ----- 1. Skin, left spinal mid upper back :       DERMATOFIBROMA, BASE INVOLVED  Benign. May recur.  A dermatofibroma is a benign growth possibly related to trauma, such as an insect bite, cut from shaving, or inflamed acne-type bump.  - please call patient ----- Message ----- From: Interface, Lab In Three Zero One Sent: 06/24/2024   6:13 PM EDT To: Rexene Rattler, MD

## 2024-07-13 DIAGNOSIS — M25512 Pain in left shoulder: Secondary | ICD-10-CM | POA: Diagnosis not present

## 2024-07-13 DIAGNOSIS — M791 Myalgia, unspecified site: Secondary | ICD-10-CM | POA: Diagnosis not present

## 2024-07-13 DIAGNOSIS — M5451 Vertebrogenic low back pain: Secondary | ICD-10-CM | POA: Diagnosis not present

## 2024-07-13 DIAGNOSIS — Z79899 Other long term (current) drug therapy: Secondary | ICD-10-CM | POA: Diagnosis not present

## 2024-07-13 NOTE — Telephone Encounter (Signed)
 SABRA

## 2024-07-28 DIAGNOSIS — S46011A Strain of muscle(s) and tendon(s) of the rotator cuff of right shoulder, initial encounter: Secondary | ICD-10-CM | POA: Diagnosis not present

## 2024-08-03 DIAGNOSIS — M65312 Trigger thumb, left thumb: Secondary | ICD-10-CM | POA: Diagnosis not present

## 2024-08-24 DIAGNOSIS — M65312 Trigger thumb, left thumb: Secondary | ICD-10-CM | POA: Diagnosis not present

## 2024-09-08 ENCOUNTER — Encounter: Payer: Self-pay | Admitting: Family Medicine

## 2024-09-08 ENCOUNTER — Ambulatory Visit (INDEPENDENT_AMBULATORY_CARE_PROVIDER_SITE_OTHER): Admitting: Family Medicine

## 2024-09-08 VITALS — BP 110/72 | HR 107 | Ht 66.0 in | Wt 123.4 lb

## 2024-09-08 DIAGNOSIS — R7309 Other abnormal glucose: Secondary | ICD-10-CM

## 2024-09-08 DIAGNOSIS — I10 Essential (primary) hypertension: Secondary | ICD-10-CM | POA: Diagnosis not present

## 2024-09-08 DIAGNOSIS — J449 Chronic obstructive pulmonary disease, unspecified: Secondary | ICD-10-CM | POA: Diagnosis not present

## 2024-09-08 DIAGNOSIS — E782 Mixed hyperlipidemia: Secondary | ICD-10-CM

## 2024-09-08 DIAGNOSIS — E559 Vitamin D deficiency, unspecified: Secondary | ICD-10-CM | POA: Diagnosis not present

## 2024-09-08 DIAGNOSIS — J441 Chronic obstructive pulmonary disease with (acute) exacerbation: Secondary | ICD-10-CM | POA: Diagnosis not present

## 2024-09-08 DIAGNOSIS — E538 Deficiency of other specified B group vitamins: Secondary | ICD-10-CM

## 2024-09-08 DIAGNOSIS — B379 Candidiasis, unspecified: Secondary | ICD-10-CM

## 2024-09-08 MED ORDER — ALBUTEROL SULFATE HFA 108 (90 BASE) MCG/ACT IN AERS: 2.0000 | INHALATION_SPRAY | RESPIRATORY_TRACT | 5 refills | Status: DC | PRN

## 2024-09-08 MED ORDER — PREDNISONE 20 MG PO TABS: ORAL_TABLET | ORAL | 0 refills | Status: DC

## 2024-09-08 MED ORDER — HYDROCOD POLI-CHLORPHE POLI ER 10-8 MG/5ML PO SUER: 5.0000 mL | Freq: Two times a day (BID) | ORAL | 0 refills | Status: DC | PRN

## 2024-09-08 MED ORDER — FLUCONAZOLE 150 MG PO TABS: 150.0000 mg | ORAL_TABLET | ORAL | 2 refills | Status: DC

## 2024-09-08 MED ORDER — LEVOFLOXACIN 500 MG PO TABS: 500.0000 mg | ORAL_TABLET | Freq: Every day | ORAL | 0 refills | Status: DC

## 2024-09-08 MED ORDER — IPRATROPIUM-ALBUTEROL 0.5-2.5 (3) MG/3ML IN SOLN: 3.0000 mL | RESPIRATORY_TRACT | 3 refills | Status: AC | PRN

## 2024-09-08 NOTE — Progress Notes (Signed)
 Subjective:    Patient ID: Caroline Conley, female    DOB: 12/23/1965, 58 y.o.   MRN: [id number]  Caroline Conley is a 58 y.o. female presenting on 09/08/2024 for COPD   HPI  Discussed the use of AI scribe software for clinical note transcription with the patient, who gave verbal consent to proceed.  History of Present Illness   Caroline Conley is a 58 year old female with COPD who presents with a COPD flare-up.  Chronic obstructive pulmonary disease exacerbation - COPD flare-up began four days ago - Symptoms include chest tightness, wheezing, and productive cough with large amounts of sputum - Associated nasal drainage and generalized malaise - First flare-up in three months, with increased frequency of exacerbations - Currently using albuterol  inhaler and nebulizer with good symptomatic relief - Previously used DuoNeb for nebulizer treatments but unable to obtain recently due to insurance coverage issues - Recent switch in insurance from [place] to [place] may affect medication coverage - Requests refill for albuterol  inhaler  Fatigue and hypersomnia - Significant fatigue with hypersomnia, sleeping 15-16 hours per day despite sleeping through the night - Suspects possible vitamin B12 deficiency and requests evaluation of vitamin levels      04/09/2024   11:39 AM 02/25/2024    8:30 AM 10/13/2023    2:57 PM  Depression screen PHQ 2/9  Decreased Interest 0 0 0  Down, Depressed, Hopeless 0 0 0  PHQ - 2 Score 0 0 0  Altered sleeping 0 0 0  Tired, decreased energy 0 0 0  Change in appetite 0 0 0  Feeling bad or failure about yourself  0 0 0  Trouble concentrating 0 0 0  Moving slowly or fidgety/restless 0 0 0  Suicidal thoughts 0 0 0  PHQ-9 Score 0 0 0  Difficult doing work/chores Not difficult at all Not difficult at all        02/25/2024    8:31 AM 10/13/2023    2:58 PM 08/26/2023    1:58 PM 08/08/2023    9:11 AM  GAD 7 : Generalized Anxiety Score  Nervous, Anxious, on  Edge 0 0 0 0  Control/stop worrying 0 0 0 0  Worry too much - different things 0 0 0 0  Trouble relaxing 0 0 0 0  Restless 0 0 0 0  Easily annoyed or irritable 0 0 0 0  Afraid - awful might happen 0 0 0 0  Total GAD 7 Score 0 0 0 0  Anxiety Difficulty Not difficult at all   Not difficult at all    Social History   Tobacco Use   Smoking status: Former    Current packs/day: 0.00    Average packs/day: 1 pack/day for 30.0 years (30.0 ttl pk-yrs)    Types: Cigarettes    Start date: 03/02/1988    Quit date: 03/02/2018    Years since quitting: 6.5   Smokeless tobacco: Never   Tobacco comments:    0.5 to 1 ppd >30 years  Vaping Use   Vaping status: Never Used  Substance Use Topics   Alcohol use: No   Drug use: Yes    Types: Oxycodone     Review of Systems Per HPI unless specifically indicated above     Objective:    BP 110/72 (BP Location: Right Arm, Patient Position: Sitting, Cuff Size: Normal)   Pulse (!) 107   Ht 5' 6 (1.676 m)   Wt 123 lb 6 oz (56 kg)  SpO2 95%   BMI 19.91 kg/m   Wt Readings from Last 3 Encounters:  09/08/24 123 lb 6 oz (56 kg)  06/03/24 128 lb 2 oz (58.1 kg)  04/09/24 125 lb (56.7 kg)    Physical Exam Vitals and nursing note reviewed.  Constitutional:      General: She is not in acute distress.    Appearance: She is well-developed. She is not diaphoretic.     Comments: Well-appearing, comfortable, cooperative  HENT:     Head: Normocephalic and atraumatic.  Eyes:     General:        Right eye: No discharge.        Left eye: No discharge.     Conjunctiva/sclera: Conjunctivae normal.  Neck:     Thyroid: No thyromegaly.  Cardiovascular:     Rate and Rhythm: Normal rate and regular rhythm.     Heart sounds: Normal heart sounds. No murmur heard. Pulmonary:     Effort: No respiratory distress.     Breath sounds: Wheezing present. No rales.     Comments: Some mild increased resp effort, reduced air movement Musculoskeletal:        General:  Normal range of motion.     Cervical back: Normal range of motion and neck supple.  Lymphadenopathy:     Cervical: No cervical adenopathy.  Skin:    General: Skin is warm and dry.     Findings: No erythema or rash.  Neurological:     Mental Status: She is alert and oriented to person, place, and time.  Psychiatric:        Behavior: Behavior normal.     Comments: Well groomed, good eye contact, normal speech and thoughts     Results for orders placed or performed in visit on 06/22/24  Surgical pathology   Collection Time: 06/22/24 12:00 AM  Result Value Ref Range   SURGICAL PATHOLOGY      SURGICAL PATHOLOGY [facility] [address] [place], [place] [address] Telephone [phone] or [phone] Fax [phone]  REPORT OF DERMATOPATHOLOGY   Accession #: [id number] Patient Name: MYRTIS, MAILLE Visit # : [id number]  MRN: [id number] Cytotechnologist: Munoz-Bishop Md, Eleanor, Dermatopathologist, Electronic Signature DOB/Age 10-08-1966 (Age: 5) Gender: F Collected Date: 06/22/2024 Received Date: 06/23/2024  FINAL DIAGNOSIS       1. Skin, left spinal mid upper back :       DERMATOFIBROMA, BASE INVOLVED       DATE SIGNED OUT: 06/24/2024 ELECTRONIC SIGNATURE : Munoz-Bishop Md, Melissa, Dermatopathologist, Electronic Signature  MICROSCOPIC DESCRIPTION 1. There is a proliferation of relatively uniform spindle cells in the dermis. There are thickened collagen bundles at the periphery of the lesion. There is no atypia. The features are consistent with a dermatofibroma.  These changes extend to the base of the biops y.  CASE COMMENTS STAINS USED IN DIAGNOSIS: H&E    CLINICAL HISTORY  SPECIMEN(S) OBTAINED 1. Skin, Left Spinal Mid Upper Back  SPECIMEN COMMENTS: 1. 3 mm speckled brown macule SPECIMEN CLINICAL INFORMATION: 1. Neoplasm of uncertain behavior of skin, nevus R/O dysplasia    Gross Description 1. Formalin fixed  specimen received:  5 X 4 X 1 MM, TOTO (2 P) (1 B) ( hwc )        Report signed out from the following location(s) [place].  [facility] [address] [place], [place] [address] CLIA #: [id number]  [facility] [address] [place], [place] [address]  CLIA #: [id number]       Assessment & Plan:   Problem List Items Addressed This Visit     Chronic obstructive pulmonary disease (HCC)   Relevant Medications   albuterol  (PROAIR  HFA) 108 (90 Base) MCG/ACT inhaler   ipratropium-albuterol  (DUONEB) 0.5-2.5 (3) MG/3ML SOLN   levofloxacin  (LEVAQUIN ) 500 MG tablet   chlorpheniramine-HYDROcodone  (TUSSIONEX) 10-8 MG/5ML   predniSONE  (DELTASONE ) 20 MG tablet   Essential hypertension   Relevant Orders   CBC with Differential/Platelet   Comprehensive metabolic panel with GFR   Hyperlipidemia   Relevant Orders   TSH   Other Visit Diagnoses       COPD with acute exacerbation (HCC)    -  Primary   Relevant Medications   albuterol  (PROAIR  HFA) 108 (90 Base) MCG/ACT inhaler   ipratropium-albuterol  (DUONEB) 0.5-2.5 (3) MG/3ML SOLN   levofloxacin  (LEVAQUIN ) 500 MG tablet   chlorpheniramine-HYDROcodone  (TUSSIONEX) 10-8 MG/5ML   predniSONE  (DELTASONE ) 20 MG tablet     Antibiotic-induced yeast infection       Relevant Medications   fluconazole  (DIFLUCAN ) 150 MG tablet     Vitamin B12 deficiency       Relevant Orders   Vitamin B12     Vitamin D  deficiency       Relevant Orders   VITAMIN D  25 Hydroxy (Vit-D Deficiency, Fractures)     Abnormal glucose       Relevant Orders   Hemoglobin A1c        Chronic obstructive pulmonary disease with acute exacerbation COPD exacerbation with severe symptoms requiring aggressive treatment.  Increased sputum production, wheezing. No fever. Last exacerbation 05/2024 that was treated with Doxycycline .   - Prescribe albuterol  inhaler refill. - Prescribe DuoNeb for nebulizer, pending insurance approval. - Prescribe  Levaquin  for severe flare. - Prescribe prednisone  burst for 12 days. - Prescribe Tussionex syrup. - Diflucan  for risk of yeast infection with treatment - Send all prescriptions to [facility].  Fatigue, evaluation for possible anemia, vitamin deficiency, or thyroid disorder Fatigue with excessive sleep. Possible anemia, vitamin B12 deficiency, vitamin D  deficiency, or thyroid disorder. Blood work scheduled to avoid steroid interference. - Order blood panel including anemia, iron levels, B12, vitamin D , thyroid function, and blood glucose. - Schedule blood draw for tomorrow morning at 9:15 AM.  She asks about switching cardiologist to Dr Gollan at [facility] by her preference. She has already contacted Cardiology but this was back in 2023, has not scheduled apt yet. She will call to arrange.       Orders Placed This Encounter  Procedures   TSH   Hemoglobin A1c   CBC with Differential/Platelet   Comprehensive metabolic panel with GFR    Has the patient fasted?:   Yes   VITAMIN D  25 Hydroxy (Vit-D Deficiency, Fractures)   Vitamin B12    Meds ordered this encounter  Medications   albuterol  (PROAIR  HFA) 108 (90 Base) MCG/ACT inhaler    Sig: Inhale 2 puffs into the lungs every 4 (four) hours as needed for wheezing or shortness of breath.    Dispense:  8 g    Refill:  5   ipratropium-albuterol  (DUONEB) 0.5-2.5 (3) MG/3ML SOLN    Sig: Take 3 mLs by nebulization every 4 (four) hours as needed.    Dispense:  90 mL    Refill:  3   fluconazole  (DIFLUCAN ) 150 MG tablet    Sig: Take 1 tablet (150 mg total) by mouth every other day.    Dispense:  6 tablet    Refill:  2   levofloxacin  (LEVAQUIN ) 500 MG tablet    Sig: Take 1 tablet (500 mg total) by mouth daily. For 7 days    Dispense:  7 tablet    Refill:  0   chlorpheniramine-HYDROcodone  (TUSSIONEX) 10-8 MG/5ML    Sig: Take 5 mLs by mouth every 12 (twelve) hours as needed.    Dispense:  140 mL    Refill:  0   predniSONE  (DELTASONE )  20 MG tablet    Sig: Take 2 tablets daily (40mg ) for 4 days, take 1 tab daily (20mg ) for 4 days, take half tab daily (10mg ) for 4 days    Dispense:  14 tablet    Refill:  0    Follow up plan: Return if symptoms worsen or fail to improve.   Marsa Officer, DO [facility] 09/08/2024, 4:19 PM

## 2024-09-08 NOTE — Patient Instructions (Addendum)
 Thank you for coming to the office today.    Please schedule a Follow-up Appointment to: Return if symptoms worsen or fail to improve.  If you have any other questions or concerns, please feel free to call the office or send a message through MyChart. You may also schedule an earlier appointment if necessary.  Additionally, you may be receiving a survey about your experience at our office within a few days to 1 week by e-mail or mail. We value your feedback.  Marsa Officer, DO [facility], [place]

## 2024-09-09 ENCOUNTER — Other Ambulatory Visit

## 2024-09-09 DIAGNOSIS — E559 Vitamin D deficiency, unspecified: Secondary | ICD-10-CM | POA: Diagnosis not present

## 2024-09-09 DIAGNOSIS — I10 Essential (primary) hypertension: Secondary | ICD-10-CM | POA: Diagnosis not present

## 2024-09-09 DIAGNOSIS — E782 Mixed hyperlipidemia: Secondary | ICD-10-CM | POA: Diagnosis not present

## 2024-09-09 DIAGNOSIS — R7309 Other abnormal glucose: Secondary | ICD-10-CM | POA: Diagnosis not present

## 2024-09-09 DIAGNOSIS — E538 Deficiency of other specified B group vitamins: Secondary | ICD-10-CM | POA: Diagnosis not present

## 2024-09-10 ENCOUNTER — Ambulatory Visit: Payer: Self-pay | Admitting: Family Medicine

## 2024-09-10 ENCOUNTER — Other Ambulatory Visit (HOSPITAL_COMMUNITY): Payer: Self-pay

## 2024-09-10 LAB — CBC WITH DIFFERENTIAL/PLATELET
Absolute Lymphocytes: 3140 {cells}/uL (ref 850–3900)
Absolute Monocytes: 782 {cells}/uL (ref 200–950)
Basophils Absolute: 46 {cells}/uL (ref 0–200)
Basophils Relative: 0.4 %
Eosinophils Absolute: 288 {cells}/uL (ref 15–500)
Eosinophils Relative: 2.5 %
HCT: 41.2 % (ref 35.0–45.0)
Hemoglobin: 14 g/dL (ref 11.7–15.5)
MCH: 33 pg (ref 27.0–33.0)
MCHC: 34 g/dL (ref 32.0–36.0)
MCV: 97.2 fL (ref 80.0–100.0)
MPV: 9 fL (ref 7.5–12.5)
Monocytes Relative: 6.8 %
Neutro Abs: 7245 {cells}/uL (ref 1500–7800)
Neutrophils Relative %: 63 %
Platelets: 308 Thousand/uL (ref 140–400)
RBC: 4.24 Million/uL (ref 3.80–5.10)
RDW: 12.8 % (ref 11.0–15.0)
Total Lymphocyte: 27.3 %
WBC: 11.5 Thousand/uL — ABNORMAL HIGH (ref 3.8–10.8)

## 2024-09-10 LAB — COMPREHENSIVE METABOLIC PANEL WITH GFR
AG Ratio: 1.7 (calc) (ref 1.0–2.5)
ALT: 16 U/L (ref 6–29)
AST: 18 U/L (ref 10–35)
Albumin: 3.8 g/dL (ref 3.6–5.1)
Alkaline phosphatase (APISO): 98 U/L (ref 37–153)
BUN/Creatinine Ratio: 7 (calc) (ref 6–22)
BUN: 9 mg/dL (ref 7–25)
CO2: 28 mmol/L (ref 20–32)
Calcium: 9.6 mg/dL (ref 8.6–10.4)
Chloride: 102 mmol/L (ref 98–110)
Creat: 1.27 mg/dL — ABNORMAL HIGH (ref 0.50–1.03)
Globulin: 2.3 g/dL (ref 1.9–3.7)
Glucose, Bld: 129 mg/dL — ABNORMAL HIGH (ref 65–99)
Potassium: 4.7 mmol/L (ref 3.5–5.3)
Sodium: 138 mmol/L (ref 135–146)
Total Bilirubin: 0.3 mg/dL (ref 0.2–1.2)
Total Protein: 6.1 g/dL (ref 6.1–8.1)
eGFR: 49 mL/min/1.73m2 — ABNORMAL LOW (ref >=60)

## 2024-09-10 LAB — HEMOGLOBIN A1C
Hgb A1c MFr Bld: 5.3 % (ref <5.7)
Mean Plasma Glucose: 105 mg/dL
eAG (mmol/L): 5.8 mmol/L

## 2024-09-10 LAB — TSH: TSH: 1.32 m[IU]/L (ref 0.40–4.50)

## 2024-09-10 LAB — VITAMIN B12: Vitamin B-12: 218 pg/mL (ref 200–1100)

## 2024-09-10 LAB — VITAMIN D 25 HYDROXY (VIT D DEFICIENCY, FRACTURES): Vit D, 25-Hydroxy: 18 ng/mL — ABNORMAL LOW (ref 30–100)

## 2024-09-14 ENCOUNTER — Encounter: Payer: Self-pay | Admitting: Family Medicine

## 2024-09-14 ENCOUNTER — Ambulatory Visit (INDEPENDENT_AMBULATORY_CARE_PROVIDER_SITE_OTHER): Admitting: Family Medicine

## 2024-09-14 VITALS — BP 124/70 | HR 106 | Ht 66.0 in | Wt 126.6 lb

## 2024-09-14 DIAGNOSIS — R7989 Other specified abnormal findings of blood chemistry: Secondary | ICD-10-CM | POA: Diagnosis not present

## 2024-09-14 DIAGNOSIS — I10 Essential (primary) hypertension: Secondary | ICD-10-CM

## 2024-09-14 DIAGNOSIS — E559 Vitamin D deficiency, unspecified: Secondary | ICD-10-CM

## 2024-09-14 DIAGNOSIS — J449 Chronic obstructive pulmonary disease, unspecified: Secondary | ICD-10-CM | POA: Diagnosis not present

## 2024-09-14 DIAGNOSIS — E538 Deficiency of other specified B group vitamins: Secondary | ICD-10-CM | POA: Diagnosis not present

## 2024-09-14 DIAGNOSIS — J441 Chronic obstructive pulmonary disease with (acute) exacerbation: Secondary | ICD-10-CM | POA: Diagnosis not present

## 2024-09-14 MED ORDER — PROAIR HFA 108 (90 BASE) MCG/ACT IN AERS: 2.0000 | INHALATION_SPRAY | RESPIRATORY_TRACT | 5 refills | Status: AC | PRN

## 2024-09-14 MED ORDER — PROAIR HFA 108 (90 BASE) MCG/ACT IN AERS: 2.0000 | INHALATION_SPRAY | RESPIRATORY_TRACT | 5 refills | Status: DC | PRN

## 2024-09-14 MED ORDER — VITAMIN D (ERGOCALCIFEROL) 1.25 MG (50000 UNIT) PO CAPS: 50000.0000 [IU] | ORAL_CAPSULE | ORAL | 0 refills | Status: AC

## 2024-09-14 MED ORDER — CYANOCOBALAMIN 1000 MCG/ML IJ SOLN: 1000.0000 ug | INTRAMUSCULAR | 0 refills | Status: DC

## 2024-09-14 NOTE — Patient Instructions (Addendum)
 Thank you for coming to the office today.  Low Vitamin D  Lab 18 Start Rx  Vitamin D3 50,000 iu weekly for 12 weeks then reduce to OTC Vitamin D3 2,000 iu daily for maintenance  Repeat lab in 3 months to repeat Chemistry (Kidney), Vitamin D  and B12  Flu Shot in middle of October  Please schedule a Follow-up Appointment to: Return for weekly B12 shots for 1 month (CMA Visit) / Lab only 3 months.  If you have any other questions or concerns, please feel free to call the office or send a message through MyChart. You may also schedule an earlier appointment if necessary.  Additionally, you may be receiving a survey about your experience at our office within a few days to 1 week by e-mail or mail. We value your feedback.  Marsa Officer, DO [facility], [place]

## 2024-09-14 NOTE — Progress Notes (Signed)
 Subjective:    Patient ID: Caroline Conley, female    DOB: 22-Feb-1966, 58 y.o.   MRN: [id number]  Caroline Conley is a 58 y.o. female presenting on 09/14/2024 for Follow-up (Question about higher dose of flu shot.)    HPI  Discussed the use of AI scribe software for clinical note transcription with the patient, who gave verbal consent to proceed.  History of Present Illness   Caroline Conley is a 58 year old female who presents for follow-up of lab results and management of vitamin deficiencies.  Emphysema COPD flare resolving Respiratory symptoms - Improved respiratory symptoms following treatment initiated on September 24th - Attributes improvement to recent respiratory treatment - Requests information about flu vaccination due to underlying lung condition - Prefers brand name ProAir  inhaler due to adverse reactions with generic version  Laboratory abnormalities - Slight elevation in white blood cell count on recent laboratory testing - Hemoglobin within normal limits - Thyroid function tests within normal limits - Hemoglobin A1c is 5.3  Renal function abnormalities - Slight increase in creatinine on recent laboratory testing - Creatinine levels have ranged from 1.16 to 1.18 over the past few years, with a recent increase  Vitamin d  deficiency - Vitamin D  level is 18 - Has not been taking vitamin D  supplementation  Vitamin b12 deficiency and fatigue - Vitamin B12 level is 218 - Significant fatigue, sleeping 15-16 hours per day - No prior B12 injections  Planned procedures and travel - Scheduled for finger surgery for trigger finger on October 17th          04/09/2024   11:39 AM 02/25/2024    8:30 AM 10/13/2023    2:57 PM  Depression screen PHQ 2/9  Decreased Interest 0 0 0  Down, Depressed, Hopeless 0 0 0  PHQ - 2 Score 0 0 0  Altered sleeping 0 0 0  Tired, decreased energy 0 0 0  Change in appetite 0 0 0  Feeling bad or failure about yourself  0 0 0  Trouble  concentrating 0 0 0  Moving slowly or fidgety/restless 0 0 0  Suicidal thoughts 0 0 0  PHQ-9 Score 0 0 0  Difficult doing work/chores Not difficult at all Not difficult at all        02/25/2024    8:31 AM 10/13/2023    2:58 PM 08/26/2023    1:58 PM 08/08/2023    9:11 AM  GAD 7 : Generalized Anxiety Score  Nervous, Anxious, on Edge 0 0 0 0  Control/stop worrying 0 0 0 0  Worry too much - different things 0 0 0 0  Trouble relaxing 0 0 0 0  Restless 0 0 0 0  Easily annoyed or irritable 0 0 0 0  Afraid - awful might happen 0 0 0 0  Total GAD 7 Score 0 0 0 0  Anxiety Difficulty Not difficult at all   Not difficult at all    Social History   Tobacco Use   Smoking status: Former    Current packs/day: 0.00    Average packs/day: 1 pack/day for 30.0 years (30.0 ttl pk-yrs)    Types: Cigarettes    Start date: 03/02/1988    Quit date: 03/02/2018    Years since quitting: 6.5   Smokeless tobacco: Never   Tobacco comments:    0.5 to 1 ppd >30 years  Vaping Use   Vaping status: Never Used  Substance Use Topics   Alcohol use: No  Drug use: Yes    Types: Oxycodone     Review of Systems Per HPI unless specifically indicated above     Objective:    BP 124/70 (BP Location: Left Arm, Patient Position: Sitting, Cuff Size: Small)   Pulse (!) 106   Ht 5' 6 (1.676 m)   Wt 126 lb 9.6 oz (57.4 kg)   SpO2 96%   BMI 20.43 kg/m   Wt Readings from Last 3 Encounters:  09/14/24 126 lb 9.6 oz (57.4 kg)  09/08/24 123 lb 6 oz (56 kg)  06/03/24 128 lb 2 oz (58.1 kg)    Physical Exam  Results for orders placed or performed in visit on 09/08/24  TSH   Collection Time: 09/09/24  9:18 AM  Result Value Ref Range   TSH 1.32 0.40 - 4.50 mIU/L  Hemoglobin A1c   Collection Time: 09/09/24  9:18 AM  Result Value Ref Range   Hgb A1c MFr Bld 5.3 <5.7 %   Mean Plasma Glucose 105 mg/dL   eAG (mmol/L) 5.8 mmol/L  CBC with Differential/Platelet   Collection Time: 09/09/24  9:18 AM  Result Value  Ref Range   WBC 11.5 (H) 3.8 - 10.8 Thousand/uL   RBC 4.24 3.80 - 5.10 Million/uL   Hemoglobin 14.0 11.7 - 15.5 g/dL   HCT 58.7 64.9 - 54.9 %   MCV 97.2 80.0 - 100.0 fL   MCH 33.0 27.0 - 33.0 pg   MCHC 34.0 32.0 - 36.0 g/dL   RDW 87.1 88.9 - 84.9 %   Platelets 308 140 - 400 Thousand/uL   MPV 9.0 7.5 - 12.5 fL   Neutro Abs 7,245 1,500 - 7,800 cells/uL   Absolute Lymphocytes 3,140 850 - 3,900 cells/uL   Absolute Monocytes 782 200 - 950 cells/uL   Eosinophils Absolute 288 15 - 500 cells/uL   Basophils Absolute 46 0 - 200 cells/uL   Neutrophils Relative % 63 %   Total Lymphocyte 27.3 %   Monocytes Relative 6.8 %   Eosinophils Relative 2.5 %   Basophils Relative 0.4 %  Comprehensive metabolic panel with GFR   Collection Time: 09/09/24  9:18 AM  Result Value Ref Range   Glucose, Bld 129 (H) 65 - 99 mg/dL   BUN 9 7 - 25 mg/dL   Creat 8.72 (H) 9.49 - 1.03 mg/dL   eGFR 49 (L) > OR = 60 mL/min/1.64m2   BUN/Creatinine Ratio 7 6 - 22 (calc)   Sodium 138 135 - 146 mmol/L   Potassium 4.7 3.5 - 5.3 mmol/L   Chloride 102 98 - 110 mmol/L   CO2 28 20 - 32 mmol/L   Calcium  9.6 8.6 - 10.4 mg/dL   Total Protein 6.1 6.1 - 8.1 g/dL   Albumin 3.8 3.6 - 5.1 g/dL   Globulin 2.3 1.9 - 3.7 g/dL (calc)   AG Ratio 1.7 1.0 - 2.5 (calc)   Total Bilirubin 0.3 0.2 - 1.2 mg/dL   Alkaline phosphatase (APISO) 98 37 - 153 U/L   AST 18 10 - 35 U/L   ALT 16 6 - 29 U/L  VITAMIN D  25 Hydroxy (Vit-D Deficiency, Fractures)   Collection Time: 09/09/24  9:18 AM  Result Value Ref Range   Vit D, 25-Hydroxy 18 (L) 30 - 100 ng/mL  Vitamin B12   Collection Time: 09/09/24  9:18 AM  Result Value Ref Range   Vitamin B-12 218 200 - 1,100 pg/mL      Assessment & Plan:   Problem List Items Addressed  This Visit     Chronic obstructive pulmonary disease (HCC)   Relevant Medications   PROAIR  HFA 108 (90 Base) MCG/ACT inhaler   Elevated serum creatinine   Essential hypertension   Relevant Orders   Basic metabolic  panel with GFR   Other Visit Diagnoses       Vitamin D  deficiency    -  Primary   Relevant Medications   Vitamin D , Ergocalciferol , (DRISDOL ) 1.25 MG (50000 UNIT) CAPS capsule   Other Relevant Orders   VITAMIN D  25 Hydroxy (Vit-D Deficiency, Fractures)     Vitamin B12 deficiency       Relevant Medications   cyanocobalamin  (VITAMIN B12) 1000 MCG/ML injection   Other Relevant Orders   Vitamin B12     COPD with acute exacerbation (HCC)       Relevant Medications   PROAIR  HFA 108 (90 Base) MCG/ACT inhaler         Vitamin B12 deficiency B12 level at 218, low for symptomatic patients with fatigue and excessive sleep. - Prescribe B12 injections weekly for 4 weeks, then monthly for 4 months. - Instruct to pick up B12 vial from pharmacy and schedule nurse visits for injections.  Fatigue - Initiate B12 injections as per Vitamin B12 deficiency plan.  Vitamin D  deficiency Vitamin D  level at 18, below recommended level, affecting immune system and energy. - Prescribe Vitamin D3 50,000 IU weekly for 12 weeks. - Advise to switch to an over-the-counter daily Vitamin D  supplement after 12 weeks.  Chronic kidney disease, Likely Stage III Increased creatinine levels suggest reduced kidney function, possibly due to dehydration or age-related decline. - Advise to stay hydrated and recheck kidney function in 3 months. - Order repeat labs in 3 months to assess kidney function, Vitamin D , and B12 levels.  COPD Emphysema Recent flare resolved  Current inhaler causing itching, prefers brand name ProAir . But not available at pharmacy question if could switch to Ventolin  - Change prescription to brand name ProAir  inhaler. Awaiting further instructions now.  General Health Maintenance Discussed flu vaccination options, recommended standard dose based on health status. - Recommend standard flu vaccine dose in mid-October.        B12 injections weekly x 4 weeks, prefer afternoons early after  lunch, Weds/Thurs approx  Orders Placed This Encounter  Procedures   Basic metabolic panel with GFR    Standing Status:   Future    Expected Date:   12/14/2024    Expiration Date:   03/14/2025   VITAMIN D  25 Hydroxy (Vit-D Deficiency, Fractures)    Standing Status:   Future    Expected Date:   12/14/2024    Expiration Date:   03/14/2025   Vitamin B12    Standing Status:   Future    Expected Date:   12/14/2024    Expiration Date:   03/14/2025    Meds ordered this encounter  Medications   Vitamin D , Ergocalciferol , (DRISDOL ) 1.25 MG (50000 UNIT) CAPS capsule    Sig: Take 1 capsule (50,000 Units total) by mouth once a week. For 12 weeks. Then start OTC Vitamin D3 2,000 unit daily.    Dispense:  12 capsule    Refill:  0   cyanocobalamin  (VITAMIN B12) 1000 MCG/ML injection    Sig: Inject 1 mL (1,000 mcg total) into the muscle once a week. For 4 weeks    Dispense:  1 mL    Refill:  0   DISCONTD: PROAIR  HFA 108 (90 Base) MCG/ACT inhaler  Sig: Inhale 2 puffs into the lungs every 4 (four) hours as needed for wheezing or shortness of breath.    Dispense:  8 g    Refill:  5    She prefers brand only ProAir    PROAIR  HFA 108 (90 Base) MCG/ACT inhaler    Sig: Inhale 2 puffs into the lungs every 4 (four) hours as needed for wheezing or shortness of breath.    Dispense:  8 g    Refill:  5    She prefers brand only ProAir     Follow up plan: Return for weekly B12 shots for 1 month (CMA Visit) / Lab only 3 months.  Future labs ordered for BMET 3 month, Vitamin D , B12    Caroline Officer, DO [facility] 09/14/2024, 1:19 PM

## 2024-09-21 DIAGNOSIS — Z01818 Encounter for other preprocedural examination: Secondary | ICD-10-CM | POA: Diagnosis not present

## 2024-09-29 ENCOUNTER — Ambulatory Visit (INDEPENDENT_AMBULATORY_CARE_PROVIDER_SITE_OTHER)

## 2024-09-29 DIAGNOSIS — E538 Deficiency of other specified B group vitamins: Secondary | ICD-10-CM | POA: Diagnosis not present

## 2024-09-29 MED ORDER — CYANOCOBALAMIN 1000 MCG/ML IJ SOLN
1000.0000 ug | Freq: Once | INTRAMUSCULAR | Status: AC
  Administered 2024-09-29: 1000 ug via INTRAMUSCULAR

## 2024-10-01 DIAGNOSIS — M65842 Other synovitis and tenosynovitis, left hand: Secondary | ICD-10-CM | POA: Diagnosis not present

## 2024-10-01 DIAGNOSIS — M65312 Trigger thumb, left thumb: Secondary | ICD-10-CM | POA: Diagnosis not present

## 2024-10-02 ENCOUNTER — Other Ambulatory Visit: Payer: Self-pay | Admitting: Family Medicine

## 2024-10-02 DIAGNOSIS — M25511 Pain in right shoulder: Secondary | ICD-10-CM

## 2024-10-02 DIAGNOSIS — B379 Candidiasis, unspecified: Secondary | ICD-10-CM

## 2024-10-05 NOTE — Telephone Encounter (Signed)
 Requested medications are due for refill today.  yes  Requested medications are on the active medications list.  yes  Last refill. Tizanidine  08/26/2023 #90 3 rf, Diflucan  09/08/2024 #6 2 rf  Future visit scheduled.   Yes - a wellness visit.  Notes to clinic.  Provider to review for diflucan , Tizanidine  is not delegated.    Requested Prescriptions  Pending Prescriptions Disp Refills   tiZANidine  (ZANAFLEX ) 4 MG tablet [Pharmacy Med Name: TIZANIDINE  HCL 4 MG TABLET] 90 tablet 3    Sig: TAKE ONE TABLET BY MOUTH EVERY 8 HOURS AS NEEDED FOR MUSCLE SPASMS.     Not Delegated - Cardiovascular:  Alpha-2 Agonists - tizanidine  Failed - 10/05/2024 11:08 AM      Failed - This refill cannot be delegated      Passed - Valid encounter within last 6 months    Recent Outpatient Visits           3 weeks ago Vitamin D  deficiency   [place] [place], Marsa PARAS, DO   3 weeks ago COPD with acute exacerbation [facility])   [facility] [place] Marsa PARAS, DO   4 months ago Pain in joint of right shoulder   [facility] [place] Mars[place] PARAS, DO   4 months ago COPD with acute exacerbation [facility])   [place] [facility] [place] Marsa PARAS, DO   7 months ago COPD with acute exacerbation [facility])   [place] [facility] [place] Marsa PARAS, DO       Future Appointments             In 9 months Jackquline Sawyer, MD [place] [facility]             fluconazole  (DIFLUCAN ) 150 MG tablet [Pharmacy Med Name: FLUCONAZOLE  150 MG TABLET] 6 tablet 2    Sig: TAKE 1 TABLET BY MOUTH EVERY OTHER DAY     Off-Protocol Failed - 10/05/2024 11:08 AM      Failed - Medication not assigned to a protocol, review manually.      Passed - Valid encounter within last 12 months    Recent Outpatient Visits           3 weeks ago Vitamin D  deficiency   [place] [facility] [place], Marsa PARAS, DO   3 weeks ago COPD with acute exacerbation [facility])   [facility] [place] Marsa PARAS, DO   4 months ago Pain in joint of right shoulder   [place] [facility] [place] Marsa PARAS, DO   4 months ago COPD with acute exacerbation [facility])   [facility] [place] Marsa PARAS, DO   7 months ago COPD with acute exacerbation [facility])    [facility] [place] Mars[place] PARAS, DO       Future Appointments             In 9 months Jackquline Sawyer, MD [facility]

## 2024-10-06 ENCOUNTER — Ambulatory Visit

## 2024-10-06 DIAGNOSIS — E538 Deficiency of other specified B group vitamins: Secondary | ICD-10-CM | POA: Diagnosis not present

## 2024-10-06 MED ORDER — CYANOCOBALAMIN 1000 MCG/ML IJ SOLN
1000.0000 ug | Freq: Once | INTRAMUSCULAR | Status: AC
  Administered 2024-10-06: 1000 ug via INTRAMUSCULAR

## 2024-10-13 ENCOUNTER — Other Ambulatory Visit: Payer: Self-pay | Admitting: Primary Care

## 2024-10-13 NOTE — Telephone Encounter (Signed)
 Patient needs appointment for farther refills

## 2024-10-15 ENCOUNTER — Ambulatory Visit

## 2024-10-15 DIAGNOSIS — E538 Deficiency of other specified B group vitamins: Secondary | ICD-10-CM

## 2024-10-15 MED ORDER — CYANOCOBALAMIN 1000 MCG/ML IJ SOLN
1000.0000 ug | Freq: Once | INTRAMUSCULAR | Status: AC
  Administered 2024-10-15: 1000 ug via INTRAMUSCULAR

## 2024-10-22 ENCOUNTER — Ambulatory Visit (INDEPENDENT_AMBULATORY_CARE_PROVIDER_SITE_OTHER)

## 2024-10-22 ENCOUNTER — Telehealth: Payer: Self-pay

## 2024-10-22 DIAGNOSIS — E538 Deficiency of other specified B group vitamins: Secondary | ICD-10-CM

## 2024-10-22 MED ORDER — CYANOCOBALAMIN 1000 MCG/ML IJ SOLN
1000.0000 ug | Freq: Once | INTRAMUSCULAR | Status: AC
  Administered 2024-10-22: 1000 ug via INTRAMUSCULAR

## 2024-10-22 MED ORDER — CYANOCOBALAMIN 1000 MCG/ML IJ SOLN: 1000.0000 ug | INTRAMUSCULAR | 3 refills | Status: AC

## 2024-10-22 NOTE — Telephone Encounter (Signed)
 Ordered b12 inj monthly x 4 months  Marsa Officer, DO [facility] 10/22/2024, 5:33 PM

## 2024-11-22 ENCOUNTER — Ambulatory Visit

## 2024-11-22 DIAGNOSIS — E538 Deficiency of other specified B group vitamins: Secondary | ICD-10-CM

## 2024-11-22 MED ORDER — CYANOCOBALAMIN 1000 MCG/ML IJ SOLN
1000.0000 ug | Freq: Once | INTRAMUSCULAR | Status: AC
  Administered 2024-11-22: 1000 ug via INTRAMUSCULAR

## 2024-12-15 ENCOUNTER — Other Ambulatory Visit: Payer: Self-pay | Admitting: Internal Medicine

## 2024-12-15 ENCOUNTER — Other Ambulatory Visit: Payer: Self-pay | Admitting: Family Medicine

## 2024-12-15 DIAGNOSIS — R110 Nausea: Secondary | ICD-10-CM

## 2024-12-15 DIAGNOSIS — J441 Chronic obstructive pulmonary disease with (acute) exacerbation: Secondary | ICD-10-CM

## 2024-12-15 DIAGNOSIS — J449 Chronic obstructive pulmonary disease, unspecified: Secondary | ICD-10-CM

## 2024-12-15 DIAGNOSIS — T23111A Burn of first degree of right thumb (nail), initial encounter: Secondary | ICD-10-CM

## 2024-12-15 MED ORDER — PREDNISONE 20 MG PO TABS: ORAL_TABLET | ORAL | 0 refills | Status: DC

## 2024-12-15 MED ORDER — HYDROCOD POLI-CHLORPHE POLI ER 10-8 MG/5ML PO SUER: 5.0000 mL | Freq: Two times a day (BID) | ORAL | 0 refills | Status: AC | PRN

## 2024-12-15 MED ORDER — DOXYCYCLINE HYCLATE 100 MG PO TABS: 100.0000 mg | ORAL_TABLET | Freq: Two times a day (BID) | ORAL | 0 refills | Status: AC

## 2024-12-15 MED ORDER — ONDANSETRON 4 MG PO TBDP: 4.0000 mg | ORAL_TABLET | Freq: Three times a day (TID) | ORAL | 0 refills | Status: AC | PRN

## 2024-12-17 NOTE — Telephone Encounter (Signed)
 Requested medication (s) are due for refill today: yes  Requested medication (s) are on the active medication list: yes  Last refill:  12/19/23  Future visit scheduled: yes  Notes to clinic:   Medication not assigned to a protocol, review manually.      Requested Prescriptions  Pending Prescriptions Disp Refills   silver  sulfADIAZINE  (SILVADENE ) 1 % cream [Pharmacy Med Name: SILVER  SULFADIAZINE  1% CREAM] 50 g 0    Sig: APPLY 1 APPLICATION TOPICALLY DAILY FOR 1-2 WEEKS THEN AS NEEDED     Off-Protocol Failed - 12/17/2024 10:33 AM      Failed - Medication not assigned to a protocol, review manually.      Passed - Valid encounter within last 12 months    Recent Outpatient Visits           3 months ago Vitamin D  deficiency   [place] [facility] [place], Marsa PARAS, DO   3 months ago COPD with acute exacerbation [facility])   [place] [facility] [place]a PARAS, DO   6 months ago Pain in joint of right shoulder   [facility] [place]a PARAS, DO   6 months ago COPD with acute exacerbation [facility])   [place] [facility] [place] Marsa PARAS, DO   9 months ago COPD with acute exacerbation [facility])   [place] [facility] [place]a PARAS, DO       Future Appointments             In 6 months Jackquline Sawyer, MD [facility]

## 2024-12-20 ENCOUNTER — Ambulatory Visit: Admitting: Family Medicine

## 2024-12-20 ENCOUNTER — Encounter: Payer: Self-pay | Admitting: Family Medicine

## 2024-12-20 VITALS — BP 110/74 | HR 104 | Ht 66.0 in | Wt 132.5 lb

## 2024-12-20 DIAGNOSIS — E538 Deficiency of other specified B group vitamins: Secondary | ICD-10-CM | POA: Diagnosis not present

## 2024-12-20 DIAGNOSIS — M7551 Bursitis of right shoulder: Secondary | ICD-10-CM

## 2024-12-20 DIAGNOSIS — J432 Centrilobular emphysema: Secondary | ICD-10-CM

## 2024-12-20 DIAGNOSIS — M25511 Pain in right shoulder: Secondary | ICD-10-CM

## 2024-12-20 DIAGNOSIS — J439 Emphysema, unspecified: Secondary | ICD-10-CM

## 2024-12-20 MED ORDER — CYANOCOBALAMIN 1000 MCG/ML IJ SOLN
1000.0000 ug | Freq: Once | INTRAMUSCULAR | Status: AC
  Administered 2024-12-20: 1000 ug via INTRAMUSCULAR

## 2024-12-20 MED ORDER — METHYLPREDNISOLONE ACETATE 40 MG/ML IJ SUSP
40.0000 mg | Freq: Once | INTRAMUSCULAR | Status: AC
  Administered 2024-12-20: 40 mg via INTRA_ARTICULAR

## 2024-12-20 MED ORDER — LIDOCAINE HCL (PF) 1 % IJ SOLN
4.0000 mL | Freq: Once | INTRAMUSCULAR | Status: AC
  Administered 2024-12-20: 4 mL

## 2024-12-20 NOTE — Progress Notes (Signed)
 "  Subjective:    Patient ID: Caroline Conley, female    DOB: 1966/04/25, 59 y.o.   MRN: [id number]  Caroline Conley is a 59 y.o. female presenting on 12/20/2024 for Shoulder Pain (Right )   HPI  Discussed the use of AI scribe software for clinical note transcription with the patient, who gave verbal consent to proceed.  History of Present Illness   Caroline Conley is a 59 year old female who presents for a subacromial injection for right shoulder pain.   Vitamin b12 supplementation - Received a B12 injection      FOLLOW-UP SHOULDER PAIN / history of chronic R frozen shoulder and prior rotator cuff surgery   - Last visit with me for this problem with steroid injection R shoulder back in 05/2024 - Interval update with improved R shoulder pain from bursitis for past >4 yrs years. Gradual worsening again but did well with prior shots   - Today patient reports ready for new injection. Known chronic problem for few years after her R shoulder surgery, seems to hurt worse post-op than before, has had frozen shoulder, in past received steroid subacromial injections into bursa with good results.   - Requesting new injection today, she was followed by [facility] in [place] in past. She prefers to defer surgery.   - Taking NSAID and chronic pain meds percocet with some relief - Her range of motion has never improved post op due to frozen shoulder - No new fall or injury - Denies any redness, bruising, swelling, numbness, tingling, weakness   She also has a history of COPD, which recently experienced a flare-up. This was managed with doxycycline  and steroids, resulting in significant improvement in her breathing.       04/09/2024   11:39 AM 02/25/2024    8:30 AM 10/13/2023    2:57 PM  Depression screen PHQ 2/9  Decreased Interest 0 0 0  Down, Depressed, Hopeless 0 0 0  PHQ - 2 Score 0 0 0  Altered sleeping 0 0 0  Tired, decreased energy 0 0 0  Change in appetite 0 0 0  Feeling bad or  failure about yourself  0 0 0  Trouble concentrating 0 0 0  Moving slowly or fidgety/restless 0 0 0  Suicidal thoughts 0 0 0  PHQ-9 Score 0  0  0   Difficult doing work/chores Not difficult at all Not difficult at all      Data saved with a previous flowsheet row definition       02/25/2024    8:31 AM 10/13/2023    2:58 PM 08/26/2023    1:58 PM 08/08/2023    9:11 AM  GAD 7 : Generalized Anxiety Score  Nervous, Anxious, on Edge 0 0 0 0  Control/stop worrying 0 0 0 0  Worry too much - different things 0 0 0 0  Trouble relaxing 0 0 0 0  Restless 0 0 0 0  Easily annoyed or irritable 0 0 0 0  Afraid - awful might happen 0 0 0 0  Total GAD 7 Score 0 0 0 0  Anxiety Difficulty Not difficult at all   Not difficult at all    Social History[1]  Review of Systems Per HPI unless specifically indicated above     Objective:    BP 110/74 (BP Location: Right Arm, Patient Position: Sitting, Cuff Size: Normal)   Pulse (!) 104   Ht 5' 6 (1.676 m)   Wt  132 lb 8 oz (60.1 kg)   SpO2 96%   BMI 21.39 kg/m   Wt Readings from Last 3 Encounters:  12/20/24 132 lb 8 oz (60.1 kg)  09/14/24 126 lb 9.6 oz (57.4 kg)  09/08/24 123 lb 6 oz (56 kg)    Physical Exam Vitals and nursing note reviewed.  Constitutional:      General: She is not in acute distress.    Appearance: Normal appearance. She is well-developed. She is not diaphoretic.     Comments: Well-appearing, comfortable, cooperative  HENT:     Head: Normocephalic and atraumatic.  Eyes:     General:        Right eye: No discharge.        Left eye: No discharge.     Conjunctiva/sclera: Conjunctivae normal.  Cardiovascular:     Rate and Rhythm: Normal rate.  Pulmonary:     Effort: Pulmonary effort is normal. No respiratory distress.     Breath sounds: Wheezing present.     Comments: Improved, mild wheezes only Musculoskeletal:     Comments: Right Shoulder reduced range of motion but has intact forward flex to 90* and some  abduction.  Skin:    General: Skin is warm and dry.     Findings: No erythema or rash.  Neurological:     Mental Status: She is alert and oriented to person, place, and time.  Psychiatric:        Mood and Affect: Mood normal.        Behavior: Behavior normal.        Thought Content: Thought content normal.     Comments: Well groomed, good eye contact, normal speech and thoughts     PROCEDURE NOTE Date: 12/20/2024 Right Shoulder subacromial injection Discussed benefits and risks (including pain, bleeding, infection, steroid flare). Verbal consent given by patient. Medication:  1 cc Depo-medrol  40mg  and 4 cc Lidocaine  1% without epi Time Out taken  Landmarks identified. Area cleansed with alcohol wipes. Using 21 gauge and 1, 1/2 inch needle, Right subacromial bursa space was injected (with above listed medication) via posterior approach cold spray used for superficial anesthetic. Sterile bandage placed. Patient tolerated procedure well without bleeding or paresthesias. No complications.  Results for orders placed or performed in visit on 09/08/24  TSH   Collection Time: 09/09/24  9:18 AM  Result Value Ref Range   TSH 1.32 0.40 - 4.50 mIU/L  Hemoglobin A1c   Collection Time: 09/09/24  9:18 AM  Result Value Ref Range   Hgb A1c MFr Bld 5.3 <5.7 %   Mean Plasma Glucose 105 mg/dL   eAG (mmol/L) 5.8 mmol/L  CBC with Differential/Platelet   Collection Time: 09/09/24  9:18 AM  Result Value Ref Range   WBC 11.5 (H) 3.8 - 10.8 Thousand/uL   RBC 4.24 3.80 - 5.10 Million/uL   Hemoglobin 14.0 11.7 - 15.5 g/dL   HCT 58.7 64.9 - 54.9 %   MCV 97.2 80.0 - 100.0 fL   MCH 33.0 27.0 - 33.0 pg   MCHC 34.0 32.0 - 36.0 g/dL   RDW 87.1 88.9 - 84.9 %   Platelets 308 140 - 400 Thousand/uL   MPV 9.0 7.5 - 12.5 fL   Neutro Abs 7,245 1,500 - 7,800 cells/uL   Absolute Lymphocytes 3,140 850 - 3,900 cells/uL   Absolute Monocytes 782 200 - 950 cells/uL   Eosinophils Absolute 288 15 - 500 cells/uL    Basophils Absolute 46 0 - 200 cells/uL  Neutrophils Relative % 63 %   Total Lymphocyte 27.3 %   Monocytes Relative 6.8 %   Eosinophils Relative 2.5 %   Basophils Relative 0.4 %  Comprehensive metabolic panel with GFR   Collection Time: 09/09/24  9:18 AM  Result Value Ref Range   Glucose, Bld 129 (H) 65 - 99 mg/dL   BUN 9 7 - 25 mg/dL   Creat 8.72 (H) 9.49 - 1.03 mg/dL   eGFR 49 (L) > OR = 60 mL/min/1.69m2   BUN/Creatinine Ratio 7 6 - 22 (calc)   Sodium 138 135 - 146 mmol/L   Potassium 4.7 3.5 - 5.3 mmol/L   Chloride 102 98 - 110 mmol/L   CO2 28 20 - 32 mmol/L   Calcium  9.6 8.6 - 10.4 mg/dL   Total Protein 6.1 6.1 - 8.1 g/dL   Albumin 3.8 3.6 - 5.1 g/dL   Globulin 2.3 1.9 - 3.7 g/dL (calc)   AG Ratio 1.7 1.0 - 2.5 (calc)   Total Bilirubin 0.3 0.2 - 1.2 mg/dL   Alkaline phosphatase (APISO) 98 37 - 153 U/L   AST 18 10 - 35 U/L   ALT 16 6 - 29 U/L  VITAMIN D  25 Hydroxy (Vit-D Deficiency, Fractures)   Collection Time: 09/09/24  9:18 AM  Result Value Ref Range   Vit D, 25-Hydroxy 18 (L) 30 - 100 ng/mL  Vitamin B12   Collection Time: 09/09/24  9:18 AM  Result Value Ref Range   Vitamin B-12 218 200 - 1,100 pg/mL      Assessment & Plan:   Problem List Items Addressed This Visit     Chronic bursitis of right shoulder - Primary   Chronic obstructive pulmonary disease (HCC)   Shoulder joint pain   Other Visit Diagnoses       B12 deficiency       Relevant Medications   cyanocobalamin  (VITAMIN B12) injection 1,000 mcg (Completed)        Vitamin B12 deficiency Vitamin B12 deficiency managed with monthly injections. - Continue monthly B12 injections.   Right Shoulder Pain, chronic Chronic Bursitis Last inj 05/2024, usually about 1-2 x per year, every 6-12 months Chronic pain managed with corticosteroid injections. Prefers injections over surgery to delay shoulder replacement. - Administer right shoulder corticosteroid injection. - Monitor response to injection,  expecting relief by this evening or tomorrow. Future reconsider Ortho return  Acute COPD Exacerbation - resolving Recent flare treated with Doxycycline  and cough medication and steroid Handicap Placard, due to COPD emphysema dyspnea   No orders of the defined types were placed in this encounter.   Meds ordered this encounter  Medications   cyanocobalamin  (VITAMIN B12) injection 1,000 mcg   methylPREDNISolone  acetate (DEPO-MEDROL ) injection 40 mg   lidocaine  (PF) (XYLOCAINE ) 1 % injection 4 mL    Follow up plan: Return if symptoms worsen or fail to improve.   Marsa Officer, DO [facility] 12/20/2024, 2:47 PM     [1]  Social History Tobacco Use   Smoking status: Former    Current packs/day: 0.00    Average packs/day: 1 pack/day for 30.0 years (30.0 ttl pk-yrs)    Types: Cigarettes    Start date: 03/02/1988    Quit date: 03/02/2018    Years since quitting: 6.8   Smokeless tobacco: Never   Tobacco comments:    0.5 to 1 ppd >30 years  Vaping Use   Vaping status: Never Used  Substance Use Topics  Alcohol use: No   Drug use: Yes    Types: Oxycodone    "

## 2024-12-20 NOTE — Patient Instructions (Addendum)
Thank you for coming to the office today.  You received a Right Shoulder Joint steroid injection today. - Lidocaine numbing medicine may ease the pain initially for a few hours until it wears off - As discussed, you may experience a "steroid flare" this evening or within 24-48 hours, anytime medicine is injected into an inflamed joint it can cause the pain to get worse temporarily - Everyone responds differently to these injections, it depends on the patient and the severity of the joint problem, it may provide anywhere from days to weeks, to months of relief. Ideal response is >6 months relief - Try to take it easy for next 1-2 days, avoid over activity and strain on joint (limit lifting for shoulder) - Recommend the following:   - For swelling - rest, compression sleeve / ACE wrap, elevation, and ice packs as needed for first few days   - For pain in future may use heating pad or moist heat as needed  Please schedule a Follow-up Appointment to: Return if symptoms worsen or fail to improve.  If you have any other questions or concerns, please feel free to call the office or send a message through MyChart. You may also schedule an earlier appointment if necessary.  Additionally, you may be receiving a survey about your experience at our office within a few days to 1 week by e-mail or mail. We value your feedback.  Merna Baldi, DO [facility] 

## 2024-12-23 ENCOUNTER — Ambulatory Visit

## 2024-12-27 ENCOUNTER — Other Ambulatory Visit: Payer: Self-pay | Admitting: Family Medicine

## 2024-12-27 DIAGNOSIS — B379 Candidiasis, unspecified: Secondary | ICD-10-CM

## 2024-12-28 NOTE — Telephone Encounter (Signed)
 Requested medication (s) are due for refill today - unknown  Requested medication (s) are on the active medication list -yes  Future visit scheduled -no  Last refill: 10/05/24 #6 2RF  Notes to clinic: off protocol- provider review   Requested Prescriptions  Pending Prescriptions Disp Refills   fluconazole  (DIFLUCAN ) 150 MG tablet [Pharmacy Med Name: FLUCONAZOLE  150 MG TABLET] 6 tablet 2    Sig: TAKE 1 TABLET BY MOUTH EVERY OTHER DAY     Off-Protocol Failed - 12/28/2024  3:22 PM      Failed - Medication not assigned to a protocol, review manually.      Passed - Valid encounter within last 12 months    Recent Outpatient Visits           1 week ago Chronic bursitis of right shoulder   [facility] [place], Marsa PARAS, DO   3 months ago Vitamin D  deficiency   [facility] [place], Marsa PARAS, DO   3 months ago COPD with acute exacerbation [facility])   [place] [facility] [place] [facility] PARAS, DO   6 months ago Pain in joint of right shoulder   [place] [facility] [place] [facility]a PARAS, DO   7 months ago COPD with acute exacerbation [facility])   [place] [facility] [place] Marsa PARAS, DO       Future Appointments             In 6 months Jackquline Sawyer, MD [facility]               Requested Prescriptions  Pending Prescriptions Disp Refills   fluconazole  (DIFLUCAN ) 150 MG tablet [Pharmacy Med Name: FLUCONAZOLE  150 MG TABLET] 6 tablet 2    Sig: TAKE 1 TABLET BY MOUTH EVERY OTHER DAY     Off-Protocol Failed - 12/28/2024  3:22 PM      Failed - Medication not assigned to a protocol, review manually.      Passed - Valid encounter within last 12 months    Recent Outpatient Visits           1 week ago Chronic bursitis of right shoulder   [place] [place], Marsa PARAS, DO   3 months ago  Vitamin D  deficiency   [facility] [place] [facility] PARAS, DO   3 months ago COPD with acute exacerbation [facility])   [place] [facility] [place] PARAS, DO   6 months ago Pain in joint of right shoulder   [place] [facility] [place] [facility] PARAS, DO   7 months ago COPD with acute exacerbation [facility])   [place] [facility] [place]a PARAS, DO       Future Appointments             In 6 months Jackquline Sawyer, MD [facility]

## 2024-12-30 ENCOUNTER — Ambulatory Visit: Payer: Self-pay

## 2024-12-30 NOTE — Telephone Encounter (Signed)
" °  FYI Only or Action Required?: FYI only for provider: appointment scheduled on 1/21.  Patient was last seen in primary care on 12/20/2024 by Caroline Conley PARAS, DO.  Called Nurse Triage reporting Back Pain.  Symptoms began 4 days ago.  Interventions attempted: Prescription medications: percocet.  Symptoms are: gradually worsening.  Triage Disposition: See PCP When Office is Open (Within 3 Days)  Patient/caregiver understands and will follow disposition?: Yes  Decision tree denial -Pt need to schedule an appointment for referral request to see a spine doctor due to back pain that has really been getting worst.  Reason for Disposition  [1] MODERATE back pain (e.g., interferes with normal activities) AND [2] present > 3 days  Answer Assessment - Initial Assessment Questions Requesting referral for back pain, only wanted to see PCP  1. ONSET: When did the pain begin? (e.g., minutes, hours, days)     4 days 2. LOCATION: Where does it hurt? (upper, mid or lower back)     Low back 3. SEVERITY: How bad is the pain?  (e.g., Scale 1-10; mild, moderate, or severe)     moderate 4. PATTERN: Is the pain constant? (e.g., yes, no; constant, intermittent)      constant 5. RADIATION: Does the pain shoot into your legs or somewhere else?     NO 6. CAUSE:  What do you think is causing the back pain?       7. BACK OVERUSE:  Any recent lifting of heavy objects, strenuous work or exercise?      8. MEDICINES: What have you taken so far for the pain? (e.g., nothing, acetaminophen , NSAIDS)     percocet 9. NEUROLOGIC SYMPTOMS: Do you have any weakness, numbness, or problems with bowel/bladder control?     denies 10. OTHER SYMPTOMS: Do you have any other symptoms? (e.g., fever, abdomen pain, burning with urination, blood in urine)       no 11. PREGNANCY: Is there any chance you are pregnant? When was your last menstrual period?  Protocols used: Back Pain-A-AH  "

## 2025-01-05 ENCOUNTER — Ambulatory Visit: Admitting: Family Medicine

## 2025-01-15 ENCOUNTER — Other Ambulatory Visit: Payer: Self-pay | Admitting: Primary Care

## 2025-01-17 ENCOUNTER — Ambulatory Visit: Admitting: Physical Medicine and Rehabilitation

## 2025-01-19 ENCOUNTER — Other Ambulatory Visit: Payer: Self-pay

## 2025-01-19 MED ORDER — TRELEGY ELLIPTA 200-62.5-25 MCG/ACT IN AEPB: 1.0000 | INHALATION_SPRAY | Freq: Every day | RESPIRATORY_TRACT | 0 refills | Status: AC

## 2025-01-19 NOTE — Progress Notes (Signed)
 Refill request from Caroline Conley [facility] pharmacy for Trelegy received. Courtesy refill given with 0 refills. Pt needs an appt for further refills.   I called and spoke to pt. Pt informed of refill and a f/u appt was made for Dr Isaiah. NFN

## 2025-01-21 ENCOUNTER — Ambulatory Visit

## 2025-01-21 DIAGNOSIS — E538 Deficiency of other specified B group vitamins: Secondary | ICD-10-CM

## 2025-01-21 MED ORDER — CYANOCOBALAMIN 1000 MCG/ML IJ SOLN
1000.0000 ug | Freq: Once | INTRAMUSCULAR | Status: AC
  Administered 2025-01-21: 1000 ug via INTRAMUSCULAR

## 2025-01-24 ENCOUNTER — Ambulatory Visit

## 2025-01-27 ENCOUNTER — Ambulatory Visit: Admitting: Physical Medicine and Rehabilitation

## 2025-02-15 ENCOUNTER — Ambulatory Visit: Admitting: Internal Medicine

## 2025-02-21 ENCOUNTER — Ambulatory Visit

## 2025-04-15 ENCOUNTER — Ambulatory Visit

## 2025-04-20 ENCOUNTER — Ambulatory Visit

## 2025-07-05 ENCOUNTER — Ambulatory Visit: Admitting: Dermatology
